# Patient Record
Sex: Female | Born: 1935 | Race: White | Hispanic: No | Smoking: Never smoker
Health system: Southern US, Community
[De-identification: ages and names within clinical notes are randomized; demographics above are authoritative.]

## PROBLEM LIST (undated history)

## (undated) DIAGNOSIS — G8929 Other chronic pain: Secondary | ICD-10-CM

## (undated) DIAGNOSIS — G43909 Migraine, unspecified, not intractable, without status migrainosus: Secondary | ICD-10-CM

## (undated) DIAGNOSIS — I471 Supraventricular tachycardia, unspecified: Secondary | ICD-10-CM

## (undated) DIAGNOSIS — K219 Gastro-esophageal reflux disease without esophagitis: Secondary | ICD-10-CM

## (undated) DIAGNOSIS — M1990 Unspecified osteoarthritis, unspecified site: Secondary | ICD-10-CM

## (undated) DIAGNOSIS — E782 Mixed hyperlipidemia: Secondary | ICD-10-CM

## (undated) DIAGNOSIS — I10 Essential (primary) hypertension: Secondary | ICD-10-CM

## (undated) DIAGNOSIS — C4490 Unspecified malignant neoplasm of skin, unspecified: Secondary | ICD-10-CM

## (undated) DIAGNOSIS — I209 Angina pectoris, unspecified: Secondary | ICD-10-CM

## (undated) DIAGNOSIS — M810 Age-related osteoporosis without current pathological fracture: Secondary | ICD-10-CM

## (undated) DIAGNOSIS — M545 Low back pain, unspecified: Secondary | ICD-10-CM

## (undated) DIAGNOSIS — I499 Cardiac arrhythmia, unspecified: Secondary | ICD-10-CM

## (undated) DIAGNOSIS — E039 Hypothyroidism, unspecified: Secondary | ICD-10-CM

## (undated) DIAGNOSIS — R0602 Shortness of breath: Secondary | ICD-10-CM

## (undated) DIAGNOSIS — D649 Anemia, unspecified: Secondary | ICD-10-CM

## (undated) HISTORY — PX: ABDOMINAL HYSTERECTOMY: SHX81

## (undated) HISTORY — PX: BACK SURGERY: SHX140

## (undated) HISTORY — PX: THROAT SURGERY: SHX803

## (undated) HISTORY — PX: CATARACT EXTRACTION W/ INTRAOCULAR LENS  IMPLANT, BILATERAL: SHX1307

---

## 1998-05-17 ENCOUNTER — Encounter: Payer: Self-pay | Admitting: Otolaryngology

## 1998-06-24 ENCOUNTER — Encounter: Payer: Self-pay | Admitting: Otolaryngology

## 1998-06-25 ENCOUNTER — Ambulatory Visit (HOSPITAL_COMMUNITY): Admission: RE | Admit: 1998-06-25 | Discharge: 1998-06-26 | Payer: Self-pay | Admitting: Otolaryngology

## 2000-02-13 ENCOUNTER — Encounter: Payer: Self-pay | Admitting: *Deleted

## 2000-02-13 ENCOUNTER — Encounter: Admission: RE | Admit: 2000-02-13 | Discharge: 2000-02-13 | Payer: Self-pay | Admitting: *Deleted

## 2000-03-07 ENCOUNTER — Encounter: Payer: Self-pay | Admitting: *Deleted

## 2000-03-07 ENCOUNTER — Encounter: Admission: RE | Admit: 2000-03-07 | Discharge: 2000-03-07 | Payer: Self-pay | Admitting: *Deleted

## 2000-03-09 ENCOUNTER — Encounter: Payer: Self-pay | Admitting: *Deleted

## 2000-03-09 ENCOUNTER — Other Ambulatory Visit: Admission: RE | Admit: 2000-03-09 | Discharge: 2000-03-09 | Payer: Self-pay | Admitting: *Deleted

## 2000-03-09 ENCOUNTER — Encounter: Admission: RE | Admit: 2000-03-09 | Discharge: 2000-03-09 | Payer: Self-pay | Admitting: *Deleted

## 2000-10-01 ENCOUNTER — Other Ambulatory Visit: Admission: RE | Admit: 2000-10-01 | Discharge: 2000-10-01 | Payer: Self-pay | Admitting: Internal Medicine

## 2000-10-11 ENCOUNTER — Encounter: Payer: Self-pay | Admitting: Internal Medicine

## 2000-10-11 ENCOUNTER — Encounter: Admission: RE | Admit: 2000-10-11 | Discharge: 2000-10-11 | Payer: Self-pay | Admitting: Internal Medicine

## 2001-03-28 ENCOUNTER — Encounter: Admission: RE | Admit: 2001-03-28 | Discharge: 2001-03-28 | Payer: Self-pay | Admitting: Internal Medicine

## 2001-03-28 ENCOUNTER — Encounter: Payer: Self-pay | Admitting: Internal Medicine

## 2001-04-02 ENCOUNTER — Encounter: Admission: RE | Admit: 2001-04-02 | Discharge: 2001-04-02 | Payer: Self-pay | Admitting: Unknown Physician Specialty

## 2001-04-02 ENCOUNTER — Encounter: Payer: Self-pay | Admitting: Internal Medicine

## 2002-04-29 ENCOUNTER — Encounter: Payer: Self-pay | Admitting: Internal Medicine

## 2002-04-29 ENCOUNTER — Encounter: Admission: RE | Admit: 2002-04-29 | Discharge: 2002-04-29 | Payer: Self-pay | Admitting: Internal Medicine

## 2002-11-26 ENCOUNTER — Ambulatory Visit (HOSPITAL_COMMUNITY): Admission: RE | Admit: 2002-11-26 | Discharge: 2002-11-26 | Payer: Self-pay | Admitting: Gastroenterology

## 2003-06-01 ENCOUNTER — Encounter: Payer: Self-pay | Admitting: Internal Medicine

## 2003-06-01 ENCOUNTER — Encounter: Admission: RE | Admit: 2003-06-01 | Discharge: 2003-06-01 | Payer: Self-pay | Admitting: Internal Medicine

## 2004-03-31 ENCOUNTER — Encounter: Admission: RE | Admit: 2004-03-31 | Discharge: 2004-03-31 | Payer: Self-pay | Admitting: Internal Medicine

## 2004-06-20 ENCOUNTER — Encounter: Admission: RE | Admit: 2004-06-20 | Discharge: 2004-06-20 | Payer: Self-pay | Admitting: Internal Medicine

## 2005-08-04 ENCOUNTER — Encounter: Admission: RE | Admit: 2005-08-04 | Discharge: 2005-08-04 | Payer: Self-pay | Admitting: Internal Medicine

## 2006-08-15 ENCOUNTER — Encounter: Admission: RE | Admit: 2006-08-15 | Discharge: 2006-08-15 | Payer: Self-pay | Admitting: Internal Medicine

## 2006-08-21 HISTORY — PX: LUMBAR DISC SURGERY: SHX700

## 2006-08-24 ENCOUNTER — Encounter: Admission: RE | Admit: 2006-08-24 | Discharge: 2006-08-24 | Payer: Self-pay | Admitting: Internal Medicine

## 2006-08-24 ENCOUNTER — Encounter (INDEPENDENT_AMBULATORY_CARE_PROVIDER_SITE_OTHER): Payer: Self-pay | Admitting: *Deleted

## 2006-09-27 ENCOUNTER — Encounter: Admission: RE | Admit: 2006-09-27 | Discharge: 2006-09-27 | Payer: Self-pay | Admitting: Internal Medicine

## 2007-03-27 ENCOUNTER — Encounter: Admission: RE | Admit: 2007-03-27 | Discharge: 2007-03-27 | Payer: Self-pay | Admitting: Internal Medicine

## 2007-04-03 ENCOUNTER — Encounter: Admission: RE | Admit: 2007-04-03 | Discharge: 2007-04-03 | Payer: Self-pay | Admitting: Internal Medicine

## 2007-04-23 ENCOUNTER — Inpatient Hospital Stay (HOSPITAL_COMMUNITY): Admission: RE | Admit: 2007-04-23 | Discharge: 2007-04-25 | Payer: Self-pay | Admitting: Neurosurgery

## 2007-06-03 ENCOUNTER — Inpatient Hospital Stay (HOSPITAL_COMMUNITY): Admission: AD | Admit: 2007-06-03 | Discharge: 2007-06-06 | Payer: Self-pay | Admitting: Internal Medicine

## 2007-08-19 ENCOUNTER — Encounter: Admission: RE | Admit: 2007-08-19 | Discharge: 2007-08-19 | Payer: Self-pay | Admitting: Internal Medicine

## 2008-08-19 ENCOUNTER — Encounter: Admission: RE | Admit: 2008-08-19 | Discharge: 2008-08-19 | Payer: Self-pay | Admitting: Internal Medicine

## 2009-08-23 ENCOUNTER — Encounter: Admission: RE | Admit: 2009-08-23 | Discharge: 2009-08-23 | Payer: Self-pay | Admitting: Internal Medicine

## 2010-08-24 ENCOUNTER — Encounter
Admission: RE | Admit: 2010-08-24 | Discharge: 2010-08-24 | Payer: Self-pay | Source: Home / Self Care | Attending: Internal Medicine | Admitting: Internal Medicine

## 2011-01-03 NOTE — Consult Note (Signed)
[name], [name] NO.:  [id number]   MEDICAL RECORD NO.:  [id number]          PATIENT TYPE:  INP   LOCATION:  5502                         FACILITY:  [facility]   PHYSICIAN:  Petra Kuba, M.D.    DATE OF BIRTH:  September 12, 1920   DATE OF CONSULTATION:  06/03/2007  DATE OF DISCHARGE:                                 CONSULTATION   We were asked to see [name] today in consultation for bright red  blood per rectum by Dr. Georgann Housekeeper.   HISTORY OF PRESENT ILLNESS:  This is an 77 year old female who has a  history of spinal surgery on April 23, 2007.  Since that time, her  family reports that she has given up.  She will not get out of bed.  She has no appetite.  She seems rather depressed.  On Tuesday, October  7, she had an episode of left lower quadrant pain and hematochezia.  Dr.  Donette Larry started her on Septra for possible diverticulitis.  Her red blood  continued with black stools until Saturday.  She reports that she has  had no blood per rectum since Saturday.   Positive for NSAIDs.  She takes an 81-mg aspirin per day and Excedrin 4  to 5 times a week.  Negative vomiting.  Positive anorexia.  Positive abdominal pain with cough or deep breath in left lower  quadrant.   The patient is concerned she has cancer and is dying.   PAST MEDICAL HISTORY:  She had a colonoscopy in 2004 by Dr. Danise Edge.  It was a screening colonoscopy.  He found left-sided  diverticulosis.  She has a history of EGD with dilatation for dysphagia,  also by Dr. Danise Edge.  She has hypertension, hyperlipidemia,  gastroesophageal reflux disease, coronary artery disease, glaucoma.  She  has had thyroid surgery and a hysterectomy.   CURRENT MEDICATIONS:  Include:  1. Fosamax.  2. Diltiazem.  3. Zoloft  4. An 81-mg aspirin.  5. Lisinopril.  6. Gemfibrozil.  7. Levothyroxine.  8. Metoprolol.  9. Prilosec.  10.Xalatan.  11.Nystatin triamcinolone cream.   12.Vicodin.   SHE HAS AN ALLERGY TO ASPIRIN AS WELL AS CODEINE.   REVIEW OF SYSTEMS:  Is significant for bilateral leg weakness.  She  describes shaking in her left leg in particular as well as shaking her  hands recently.  She states that she has had weight loss since surgery  but did not quantify it as well as headache and some depression.   FAMILY HISTORY:  Is significant for pancreatic cancer in both her mother  and her brother.  Her mother had a gastrectomy for ulcer disease as  well.   On physical exam, she is alert and oriented in no apparent distress.  Her temperature is 98.1, pulse 61, respirations 18, blood pressure  135/63.  Her heart has a regular rate and rhythm with no murmurs, rubs or gallops  appreciated.  Her lungs are clear.  Her abdomen is soft, nontender, nondistended with good bowel sounds.   Her labs show a hemoglobin of 10.5 over a hematocrit 31.3, white  count  5.7, platelets 347,000.  Her BUN is 27 over a creatinine 1.83.  LFTs are  within normal limits.  Serum albumin 3.6.  CT of abdomen and pelvis is  pending today.   ASSESSMENT:  Dr. Vida Rigger has seen and examined the patient, went  through the history, reviewed her chart.  His impression is that this is  an 77 year old female with multiple medical problems who has gotten down  after some spinal surgery and has had recent hematochezia.  We will  start by reviewing her CT scan this afternoon and consider what  endoscopic procedures, if any, may be appropriate.   Thank you very much for this consultation.      Stephani Police, PA    ______________________________  Petra Kuba, M.D.    MLY/MEDQ  D:  06/03/2007  T:  06/04/2007  Job:  [id number]   cc:   Georgann Housekeeper, MD  Fax: [phone]   Petra Kuba, M.D.  Fax: [phone]

## 2011-01-03 NOTE — Op Note (Signed)
NAMELATORIA, DRY NO.:  [id number]   MEDICAL RECORD NO.:  [id number]          PATIENT TYPE:  INP   LOCATION:  5502                         FACILITY:  [facility]   PHYSICIAN:  Petra Kuba, M.D.    DATE OF BIRTH:  [birth year]   DATE OF PROCEDURE:  06/05/2007  DATE OF DISCHARGE:                               OPERATIVE REPORT   PROCEDURE:  EGD.   INDICATIONS:  Guaiac positivity anemia.  Consent was signed after risks,  benefits, methods and options thoroughly discussed multiple times during  this week of hospitalization.   MEDICINES USED:  1. Fentanyl 75 mcg.  2. Versed 7.5 mg.   PROCEDURE:  The video endoscope was inserted by direct vision.  The  esophagus was normal.  She did have a small hiatal hernia and a wide  patent thin fibrous ring at the GE junction.  The scope passed easily  into the stomach, advanced into the antrum where some minimal antritis  was seen, and advanced through a normal pylorus into a normal duodenal  bulb and around the C loop to a normal second portion of the duodenum.  No blood was seen distally.  The scope was withdrawn back to the bulb  and a good look there ruled out abnormality in that location.  The scope  was withdrawn back to the stomach and retroflexed.  High in the cardia,  the hiatal hernia was confirmed.  You could see the ring on retroflexion  as well.  The fundus angularis, lesser and greater curve were all normal  on retroflexed visualization.  Straight visualization of the stomach did  not reveal any additional findings.  Air was suctioned.  The scope was  slowly withdrawn.  Again, a good look at the esophagus was normal.  The  scope was removed.  Patient tolerated the procedure well.  There was no  obvious immediate complication.   ENDOSCOPIC DIAGNOSES:  1. Small hiatal hernia with a widely patent and fibrous ring.  2. Minimal antritis.  3. Normal EGD.   PLAN:  Consider colonoscopy as an outpatient, either by me  or Dr.  Laural Benes.  He has done it before.  Well advance diet, hopefully home  soon.  Happily see back p.r.n.           ______________________________  Petra Kuba, M.D.     MEM/MEDQ  D:  06/05/2007  T:  06/05/2007  Job:  [id number]   cc:   Petra Kuba, M.D.  Georgann Housekeeper, MD  Danise Edge, M.D.

## 2011-01-03 NOTE — H&P (Signed)
[name], [name] NO.:  [id number]   MEDICAL RECORD NO.:  [id number]           PATIENT TYPE:   LOCATION:                                 FACILITY:   PHYSICIAN:  Georgann Housekeeper, MD      DATE OF BIRTH:  12/22/1920   DATE OF ADMISSION:  06/03/2007  DATE OF DISCHARGE:                              HISTORY & PHYSICAL   CHIEF COMPLAINT:  Weakness, abdominal pain, episode of rectal bleeding.   HISTORY OF PRESENT ILLNESS:  An 77 year old female with a history of  hypertension, diverticulosis and depression.  She had come in last week  with episode of rectal bleeding episodes without much fevers.  Her white  count and hemoglobin was okay.  She continues to have a little bit of  bleeding on and off, although she said the last couple days she has not  seen much.  She still is complaining of some abdominal discomfort.  She  had last colonoscopy in 2004, that showed diverticulosis.  She was  empirically treated with Septra.  She finished that course.  She has  been having poor appetite and not eating.  She recently had lumbar  surgery by Dr. Channing Mutters last month and continues to have some discomfort and  not able to get over that and has been more and more difficult in  walking with some discomfort with the leg and the back.  She says the  pain is not much there now.  She does feel weak more.   ALLERGIES:  No known drug allergies.   CURRENT MEDICATIONS:  1. Fosamax once a week.  2. Diltiazem 120 mg daily.  3. Zoloft 50 mg daily.  4. Synthroid 50 mcg daily.  5. Metoprolol 50 mg b.i.d.  6. Aspirin 81 mg daily on hold for last 4 days.  7. Lisinopril 20 mg daily.   PAST MEDICAL HISTORY:  1. Hypertension.  2. Chest pain with stress test negative in 2006.  Remote history of      SVT, no symptoms.  3. Hyperthyroidism.  4. Depression.  5. Dyslipidemia.  6. Osteoporosis.  7. Acid reflux disease.  8. Recent back surgery for arthritis and spinal stenosis.   PAST SURGICAL  HISTORY:  1. Back surgery, Dr. Channing Mutters, in September 2008.  2. Status post TAH.  3. Status post T&A.  4. Cataract surgeries.   SOCIAL HISTORY:  No tobacco, no alcohol.  Married.  One daughter.   FAMILY HISTORY:  Noncontributory.   PHYSICAL EXAMINATION:  VITAL SIGNS:  Blood pressure is 130/80, pulse 68,  temperature 97.4.  GENERAL:  She was weak and sitting in a wheelchair.  HEENT:  Pupils reactive, mucous membranes are moist.  NECK:  Supple.  LUNGS:  Clear.  CARDIAC:  S1, S2 without murmurs, rubs or gallops.  ABDOMEN:  Soft without tenderness.  RECTAL:  Deferred.  She had guaiac positive stools last week and some  rectal bleeding.  EXTREMITIES:  No edema.  NEUROLOGIC:  Nonfocal.   LABORATORY DATA AND X-RAY FINDINGS:  Pending.  Blood chemistries and  blood counts with last hemoglobin in the office was  on October 9, and  was 11.5 with normal white count.   IMPRESSION/PLAN:  An 77 year old female with weakness progressing, some  abdominal pain, on and off rectal bleeding with colonoscopy in 2004,  empirically treated with Septra last week.   1. Abdominal pain/rectal bleeding.  I will get a gastroenterology      consult.  Get a CT of the abdomen and pelvis.  Continue intravenous      fluids.  Hold off on any antibiotics at this point.  2. Recent back surgery.  I will get Dr. Channing Mutters to followup in the      hospital.  She has an appointment coming up.  Will get physical      therapy and that might help her.  Tylenol p.r.n. for pain right      now.  3. Hypothyroidism.  Check a TSH.  Continue Synthroid.  4. Hypertension.  Continue the diltiazem and Toprol, hold the      lisinopril.  5. Depression.  Continue on Zoloft.  Will get a physical therapy      evaluation, also.      Georgann Housekeeper, MD  Electronically Signed     KH/MEDQ  D:  06/03/2007  T:  06/04/2007  Job:  [id number]

## 2011-01-03 NOTE — Op Note (Signed)
NAMEJAYDALYNN, OLIVERO NO.:  [id number]   MEDICAL RECORD NO.:  [id number]          PATIENT TYPE:  INP   LOCATION:  3172                         FACILITY:  [facility]   PHYSICIAN:  Payton Doughty, M.D.      DATE OF BIRTH:  [birth year]   DATE OF PROCEDURE:  04/23/2007  DATE OF DISCHARGE:                               OPERATIVE REPORT   PREOPERATIVE DIAGNOSIS:  Left L4-5 foraminal disk.   POSTOPERATIVE DIAGNOSIS:  Left L4-5 foraminal disk.   OPERATIVE PROCEDURE:  Left L4-5 foraminal diskectomy.   SURGEON:  Payton Doughty, M.D.   ANESTHESIA:  General endotracheal.   PREP:  Sterile Betadine prep and scrub with alcohol wipe.   COMPLICATIONS:  None.   ASSISTANT:  C[place]ton.   DOCTOR ASSISTANT:  Jenkins.   BODY OF TEXT:  This is an 77 year old lady with left L4 radiculopathy  related to foraminal disk at 04/05 taken to operating room, smoothly  anesthetized, intubated, placed prone on the operating table.  Following  shave, prep, drape in the usual sterile fashion, the skin was  infiltrated with 1% lidocaine with 1:400,000 epinephrine.  The skin was  incised over the lamina of L4.  The lamina and transverse process of L4  and L5 were exposed the left side in the subperiosteal plane.  Intraoperative x-ray confirmed correctness of level, having confirmed  correctness of  level, the lateral portion of the pars interarticularis,  the superior portion of the superior articular facet of L5 were removed.  This exposed the 4 root as it rounded the L4 pedicle.  Working slightly  inferior and lateral to it, the large herniated disk was identified.  The disk was removed without difficulty.  Careful exploration of the  nerve root revealed that it was free.  Wound was irrigated.  Hemostasis  assured.  Depo-Medrol soaked fat was used to fill the laminotomy defect.  Successive layers of O Vicryl, 2-0 Vicryl and 3-0 nylon were used to  close.  Betadine Telfa dressing was applied.  The  patient returned to  recovery room in good condition.           ______________________________  Payton Doughty, M.D.     MWR/MEDQ  D:  04/23/2007  T:  04/23/2007  Job:  [id number]

## 2011-01-03 NOTE — Discharge Summary (Signed)
[name], [name] NO.:  [id number]   MEDICAL RECORD NO.:  [id number]          PATIENT TYPE:  INP   LOCATION:  5502                         FACILITY:  [facility]   PHYSICIAN:  Georgann Housekeeper, MD      DATE OF BIRTH:  30-Jan-1921   DATE OF ADMISSION:  06/03/2007  DATE OF DISCHARGE:  06/06/2007                               DISCHARGE SUMMARY   DISCHARGE DIAGNOSES:  1. Intermittent hematochezia.  2. Abdominal pain.  3. Dehydration with renal insufficiency secondary to dehydration,      resolved.  4. Back and leg pain status post back surgery.  5. Right renal cyst.  6. Anemia.  7. History of hypertension.   MEDICATIONS ON DISCHARGE:  1. Diltiazem 120 mg daily.  2. Lisinopril 20 mg daily.  3. Metoprolol 50 mg b.i.d.  4. Zoloft 15 mg daily.  5. Synthroid 50 mcg daily.  6. Prilosec 20 mg daily.  7. Fosamax 70 mg once a week.  8. Eye drops per home use.  9. Neurontin 100 mg b.i.d.  10.Tylenol p.r.n.  11.Aspirin 81 mg daily.  12.Iron 325 two times a day.   DISPOSITION:  Home-health arrangement.  Physical therapy.  Follow up in  10 days.   CONSULTATIONS:  GI.   PROCEDURE:  Endoscopy.  EGD which showed minimal enteritis and otherwise  negative EGD.  CT scan of the abdomen and pelvis negative for any  diverticulitis or abscess.  It showed renal cyst on the right.  MRI of  the renal, abdomen, and pelvis showed a cyst which was benign in  appearance.  Follow up in 6 months with another MRI.  As far as  incidental, left renal cyst also noted, small.   LABORATORY DATA:  Hemoglobin at the time of discharge 9.6, stable.  Creatinine 0.87, BUN 7, potassium 3.8, sodium 142.  TSH was 0.95.  Blood  pressure 136/60, pulse 64, temperature 98.1.   HOSPITAL COURSE:  1. An 77 year old admitted with intermittent hematochezia.  History of      colonoscopy in 2004 showing diverticulosis.  The patient was      empirically treated with Septra.  The patient had some nonspecific    abdominal discomfort and weakness.  2. GI:  The patient was admitted with no evidence of further GI bleed      or rectal bleed noted.  Hemoglobin remained stable.  She did not      require any blood transfusion.  GI was consulted.  Because of the      same operation four years ago, they decided just to do the EGD,      which was negative.  Colonoscopy needs to be followed up as an      outpatient with Dr. Laural Benes.  The patient's abdominal pain      resolved.  CT of the abdomen and pelvis was negative for any acute      abdominal process.  As far as incidental finding, right renal cyst      was noted.  3. Right renal cyst:  The patient had that on a 2005 CT scan, which  was slightly increased in size.  MRI was obtained which showed      benign nature of the cyst and recommended getting another followup      MRI in 6 months.  4. Renal insufficiency:  Creatinine of 1.8.  Started on IV fluids.      Her enalapril was held.  The patient's creatinine came back to      normal and the blood pressure medication was started back again.      Electrolytes remained stable.  5. Back pain and left leg pain:  Status post back surgery by Dr. Channing Mutters      about a month ago.  She had physical therapy.  Neurontin was      started for pain.  That will help her.  Take twice a day.  Her      narcotics were stopped.  Just Tylenol for pain with Neurontin.  We      will continue the physical therapy at home.  Followup with Dr. Channing Mutters      as an outpatient.  Follow up with me in 10 days.      Georgann Housekeeper, MD  Electronically Signed     KH/MEDQ  D:  06/06/2007  T:  06/06/2007  Job:  [id number]

## 2011-01-03 NOTE — H&P (Signed)
NAMEHARSIMRAN, [name] NO.:  [id number]   MEDICAL RECORD NO.:  [id number]          PATIENT TYPE:  INP   LOCATION:  3172                         FACILITY:  [facility]   PHYSICIAN:  Payton Doughty, M.D.      DATE OF BIRTH:  12/29/1920   DATE OF ADMISSION:  04/23/2007  DATE OF DISCHARGE:                              HISTORY & PHYSICAL   ADMISSION DIAGNOSES:  Foraminal disk at L4-5 on the left side.   HISTORY OF PRESENT ILLNESS:  An 77 year old right-handed white lady  since the end of July has been having pain down her left leg.  Ended up  with an MRI that showed a foraminal disk on the left side at L4-5 and  she said to me the pain keeps her up at night and makes it difficult for  her to walk.   PAST MEDICAL HISTORY:  Remarkable for:  1. Hypertension.  2. Coronary artery disease.   MEDICATIONS:  She takes:  1. Fosamax 70 mg once a week.  2. Gemfibrozil twice a day.  3. Diltiazem ER 120 mg daily.  4. __________  50 mg daily.  5. Levothyroxine 50 mcg daily.  6. Toprol 50 mg twice a day.  7. Prilosec once a day.  8. Aspirin a day.  9. Fioricet.  10.Meclizine.  11.Valium.  12.Drixoral on a p.r.n. basis.  13.Xalatan 1 drop in each eye at bedtime.  14.Nystatin 2 to 3 times a day.   ALLERGIES:  SHE SAYS SHE FEELS FUNNY WHEN SHE TAKES CODEINE.   PAST SURGICAL HISTORY:  1. Hysterectomy in 1994.  2. Tonsillectomy in the very remote past.   SOCIAL HISTORY:  She does not smoke or drink and is retired.   FAMILY HISTORY:  Not given.   REVIEW OF SYSTEMS:  Remarkable for night sweats, glasses, glaucoma,  balance disturbance, hypertension, hypercholesterolemia, difficulty with  bilateral leg weakness, leg pain, difficulty with memory and anxiety.   PHYSICAL EXAMINATION:  HEENT:  Within normal limits.  NECK:  She has good range of motion of her neck.  CHEST:  Clear.  CARDIAC:  Regular rate and rhythm.  ABDOMEN:  Nontender.  No hepatosplenomegaly.  EXTREMITIES:   Without clubbing or cyanosis.  GU:  Deferred.  Peripheral pulses are good.  NEUROLOGIC:  She is awake, alert, and oriented.  Cranial nerves are  intact.  MOTOR:  A 5/5 strength throughout the upper and lower extremities.  The  knee extensors on the left side that are pain limited 4/5.  Reflexes are  1 at the right knee, absent at the left, 1 at the ankles bilaterally.  Straight leg raise was negative.   RADIOLOGIC STUDIES:  An MRI shows a foraminal disk on the left side at  L4-5 with elevation of the left L4.   CLINICAL IMPRESSION:  1. Foraminal disk on the left side at L4-5.  2. Left L4 radiculopathy.   PLAN:  Transforaminal diskectomy.  The risks and benefits have been  discussed with her and she wishes to proceed.           ______________________________  Payton Doughty,  M.D.     MWR/MEDQ  D:  04/23/2007  T:  04/23/2007  Job:  [id number]

## 2011-01-06 NOTE — Op Note (Signed)
   NAME:  Marissa, [name]                      ACCOUNT NO.:  [id number]   MEDICAL RECORD NO.:  [id number]                   PATIENT TYPE:  AMB   LOCATION:  ENDO                                 [facility]ACILITY[facility]   PHYSICIAN:  Danise Edge, M.D.                DATE OF BIRTH:  03-Jul-1921   DATE OF PROCEDURE:  11/26/2002  DATE OF DISCHARGE:                                 OPERATIVE REPORT   PROCEDURE:  Screening colonoscopy.   INDICATIONS FOR PROCEDURE:  [name] is a 77 year old female  born 03-23-1921. [name] is scheduled to undergo her first  screening colonoscopy with polypectomy to prevent colon cancer.   ENDOSCOPIST:  Charolett Bumpers, M.D.   PREMEDICATION:  Versed 5 mg, Demerol 50 mg .   DESCRIPTION OF PROCEDURE:  After obtaining informed consent, [name]  was placed in the left lateral decubitus position. I administered  intravenous Demerol and intravenous Versed to achieve conscious sedation for  the procedure. The patient's blood pressure, oxygen saturation and cardiac  rhythm were monitored throughout the procedure and documented in the medical  record.   Anal inspection was normal. Digital rectal exam was normal. The Olympus  pediatric video adjustable colonoscope was introduced into the rectum and  advanced to the cecum. Colonic preparation for the exam today was excellent.   RECTUM:  Normal.   SIGMOID COLON AND DESCENDING COLON:  Extensive left colonic diverticulosis.   SPLENIC FLEXURE:  Normal.   TRANSVERSE COLON:  Normal.   HEPATIC FLEXURE:  Normal.   ASCENDING COLON:  Normal.   CECUM AND ILEOCECAL VALVE:  Normal.    ASSESSMENT:  Left colonic diverticulosis; otherwise, normal screening  proctocolonoscopy to the cecum. No endoscopic evidence for the presence of  colorectal neoplasia.                                               Danise Edge, M.D.    MJ/MEDQ  D:  11/26/2002  T:  11/26/2002  Job:  [id number]   cc:    Georgann Housekeeper, M.D.  [address]  [place]  [place] [address]  Fax: [phone]

## 2011-04-11 ENCOUNTER — Other Ambulatory Visit: Payer: Self-pay | Admitting: Dermatology

## 2011-05-31 LAB — CBC
HCT: 31.2 — ABNORMAL LOW
MCV: 85.8
RBC: 3.31 — ABNORMAL LOW
RDW: 13.2
RDW: 13.6

## 2011-05-31 LAB — BASIC METABOLIC PANEL
BUN: 7
CO2: 22
Chloride: 112
Creatinine, Ser: 0.87
GFR calc Af Amer: >60
GFR calc non Af Amer: >60
Glucose, Bld: 106 — ABNORMAL HIGH

## 2011-05-31 LAB — APTT: aPTT: 51 — ABNORMAL HIGH

## 2011-06-01 LAB — COMPREHENSIVE METABOLIC PANEL
ALT: 11
AST: 16
AST: 18
Albumin: 3.1 — ABNORMAL LOW
Albumin: 3.6
Alkaline Phosphatase: 64
BUN: 27 — ABNORMAL HIGH
CO2: 22
Calcium: 8.5
Chloride: 108
Creatinine, Ser: 1.57 — ABNORMAL HIGH
GFR calc Af Amer: 32 — ABNORMAL LOW
GFR calc Af Amer: 38 — ABNORMAL LOW
Total Protein: 5.8 — ABNORMAL LOW

## 2011-06-01 LAB — CBC
MCHC: 33
MCHC: 33.5
MCV: 87.3
MCV: 88
Platelets: 301
RBC: 3.58 — ABNORMAL LOW
RDW: 13.6

## 2011-06-02 LAB — DIFFERENTIAL
Basophils Absolute: 0
Eosinophils Absolute: 0.4
Eosinophils Relative: 9 — ABNORMAL HIGH
Lymphocytes Relative: 35

## 2011-06-02 LAB — URINE MICROSCOPIC-ADD ON

## 2011-06-02 LAB — URINALYSIS, ROUTINE W REFLEX MICROSCOPIC
Bilirubin Urine: NEGATIVE
Glucose, UA: NEGATIVE
Hgb urine dipstick: NEGATIVE
Ketones, ur: NEGATIVE
Protein, ur: NEGATIVE

## 2011-06-02 LAB — TYPE AND SCREEN
ABO/RH(D): A POS
Antibody Screen: NEGATIVE

## 2011-06-02 LAB — COMPREHENSIVE METABOLIC PANEL
AST: 19
CO2: 28
Chloride: 105
Creatinine, Ser: 0.84
GFR calc Af Amer: >60
GFR calc non Af Amer: >60
Total Bilirubin: 0.6

## 2011-06-02 LAB — CBC
HCT: 36.5
Hemoglobin: 12.4
MCV: 88.5
RBC: 4.13
WBC: 5

## 2011-06-02 LAB — PROTIME-INR: Prothrombin Time: 13.5

## 2011-06-02 LAB — ABO/RH: ABO/RH(D): A POS

## 2011-09-06 ENCOUNTER — Other Ambulatory Visit: Payer: Self-pay | Admitting: Internal Medicine

## 2011-09-06 DIAGNOSIS — Z1231 Encounter for screening mammogram for malignant neoplasm of breast: Secondary | ICD-10-CM

## 2011-09-22 ENCOUNTER — Ambulatory Visit
Admission: RE | Admit: 2011-09-22 | Discharge: 2011-09-22 | Disposition: A | Discharge status: Home / Self Care | Payer: Medicare Other | Source: Ambulatory Visit | Attending: Internal Medicine | Admitting: Internal Medicine

## 2011-09-22 DIAGNOSIS — Z1231 Encounter for screening mammogram for malignant neoplasm of breast: Secondary | ICD-10-CM

## 2011-11-10 DIAGNOSIS — I10 Essential (primary) hypertension: Secondary | ICD-10-CM | POA: Diagnosis not present

## 2011-11-10 DIAGNOSIS — E039 Hypothyroidism, unspecified: Secondary | ICD-10-CM | POA: Diagnosis not present

## 2011-11-10 DIAGNOSIS — Z0000 Encounter for general adult medical examination without abnormal findings: Secondary | ICD-10-CM | POA: Diagnosis not present

## 2011-11-10 DIAGNOSIS — M810 Age-related osteoporosis without current pathological fracture: Secondary | ICD-10-CM | POA: Diagnosis not present

## 2011-11-10 DIAGNOSIS — K219 Gastro-esophageal reflux disease without esophagitis: Secondary | ICD-10-CM | POA: Diagnosis not present

## 2011-11-10 DIAGNOSIS — E782 Mixed hyperlipidemia: Secondary | ICD-10-CM | POA: Diagnosis not present

## 2011-11-10 DIAGNOSIS — D649 Anemia, unspecified: Secondary | ICD-10-CM | POA: Diagnosis not present

## 2011-11-14 DIAGNOSIS — E039 Hypothyroidism, unspecified: Secondary | ICD-10-CM | POA: Diagnosis not present

## 2011-11-14 DIAGNOSIS — E782 Mixed hyperlipidemia: Secondary | ICD-10-CM | POA: Diagnosis not present

## 2011-11-14 DIAGNOSIS — M810 Age-related osteoporosis without current pathological fracture: Secondary | ICD-10-CM | POA: Diagnosis not present

## 2011-11-14 DIAGNOSIS — N182 Chronic kidney disease, stage 2 (mild): Secondary | ICD-10-CM | POA: Diagnosis not present

## 2011-11-14 DIAGNOSIS — I10 Essential (primary) hypertension: Secondary | ICD-10-CM | POA: Diagnosis not present

## 2011-11-14 DIAGNOSIS — K219 Gastro-esophageal reflux disease without esophagitis: Secondary | ICD-10-CM | POA: Diagnosis not present

## 2011-12-05 DIAGNOSIS — M810 Age-related osteoporosis without current pathological fracture: Secondary | ICD-10-CM | POA: Diagnosis not present

## 2011-12-08 DIAGNOSIS — H4011X0 Primary open-angle glaucoma, stage unspecified: Secondary | ICD-10-CM | POA: Diagnosis not present

## 2011-12-08 DIAGNOSIS — H409 Unspecified glaucoma: Secondary | ICD-10-CM | POA: Diagnosis not present

## 2012-02-13 ENCOUNTER — Other Ambulatory Visit: Payer: Self-pay | Admitting: Internal Medicine

## 2012-02-13 DIAGNOSIS — R42 Dizziness and giddiness: Secondary | ICD-10-CM | POA: Diagnosis not present

## 2012-02-13 DIAGNOSIS — R270 Ataxia, unspecified: Secondary | ICD-10-CM

## 2012-02-13 DIAGNOSIS — R2681 Unsteadiness on feet: Secondary | ICD-10-CM

## 2012-02-16 ENCOUNTER — Ambulatory Visit
Admission: RE | Admit: 2012-02-16 | Discharge: 2012-02-16 | Disposition: A | Discharge status: Home / Self Care | Payer: Medicare Other | Source: Ambulatory Visit | Attending: Internal Medicine | Admitting: Internal Medicine

## 2012-02-16 DIAGNOSIS — R2681 Unsteadiness on feet: Secondary | ICD-10-CM

## 2012-02-16 DIAGNOSIS — R42 Dizziness and giddiness: Secondary | ICD-10-CM

## 2012-02-16 DIAGNOSIS — G319 Degenerative disease of nervous system, unspecified: Secondary | ICD-10-CM | POA: Diagnosis not present

## 2012-02-16 DIAGNOSIS — I672 Cerebral atherosclerosis: Secondary | ICD-10-CM | POA: Diagnosis not present

## 2012-02-16 DIAGNOSIS — R270 Ataxia, unspecified: Secondary | ICD-10-CM

## 2012-02-16 MED ORDER — GADOBENATE DIMEGLUMINE 529 MG/ML IV SOLN
11.0000 mL | Freq: Once | INTRAVENOUS | Status: AC | PRN
  Administered 2012-02-16: 11 mL via INTRAVENOUS

## 2012-02-26 DIAGNOSIS — G450 Vertebro-basilar artery syndrome: Secondary | ICD-10-CM | POA: Diagnosis not present

## 2012-02-26 DIAGNOSIS — R42 Dizziness and giddiness: Secondary | ICD-10-CM | POA: Diagnosis not present

## 2012-03-04 DIAGNOSIS — H4011X0 Primary open-angle glaucoma, stage unspecified: Secondary | ICD-10-CM | POA: Diagnosis not present

## 2012-03-04 DIAGNOSIS — H409 Unspecified glaucoma: Secondary | ICD-10-CM | POA: Diagnosis not present

## 2012-03-15 ENCOUNTER — Encounter: Payer: Medicare Other | Admitting: Physical Therapy

## 2012-03-15 ENCOUNTER — Ambulatory Visit: Payer: Medicare Other | Attending: Internal Medicine | Admitting: Rehabilitative and Restorative Service Providers"

## 2012-03-15 DIAGNOSIS — R269 Unspecified abnormalities of gait and mobility: Secondary | ICD-10-CM | POA: Insufficient documentation

## 2012-03-15 DIAGNOSIS — IMO0001 Reserved for inherently not codable concepts without codable children: Secondary | ICD-10-CM | POA: Diagnosis not present

## 2012-03-15 DIAGNOSIS — R42 Dizziness and giddiness: Secondary | ICD-10-CM | POA: Insufficient documentation

## 2012-03-27 ENCOUNTER — Ambulatory Visit: Payer: Medicare Other | Attending: Internal Medicine | Admitting: Rehabilitative and Restorative Service Providers"

## 2012-03-27 DIAGNOSIS — R42 Dizziness and giddiness: Secondary | ICD-10-CM | POA: Insufficient documentation

## 2012-03-27 DIAGNOSIS — R269 Unspecified abnormalities of gait and mobility: Secondary | ICD-10-CM | POA: Diagnosis not present

## 2012-03-27 DIAGNOSIS — IMO0001 Reserved for inherently not codable concepts without codable children: Secondary | ICD-10-CM | POA: Insufficient documentation

## 2012-03-29 ENCOUNTER — Ambulatory Visit: Payer: Medicare Other | Admitting: Rehabilitative and Restorative Service Providers"

## 2012-03-29 DIAGNOSIS — IMO0001 Reserved for inherently not codable concepts without codable children: Secondary | ICD-10-CM | POA: Diagnosis not present

## 2012-03-29 DIAGNOSIS — R42 Dizziness and giddiness: Secondary | ICD-10-CM | POA: Diagnosis not present

## 2012-03-29 DIAGNOSIS — R269 Unspecified abnormalities of gait and mobility: Secondary | ICD-10-CM | POA: Diagnosis not present

## 2012-04-02 ENCOUNTER — Ambulatory Visit: Payer: Medicare Other | Admitting: Rehabilitative and Restorative Service Providers"

## 2012-04-02 DIAGNOSIS — IMO0001 Reserved for inherently not codable concepts without codable children: Secondary | ICD-10-CM | POA: Diagnosis not present

## 2012-04-02 DIAGNOSIS — R42 Dizziness and giddiness: Secondary | ICD-10-CM | POA: Diagnosis not present

## 2012-04-02 DIAGNOSIS — R269 Unspecified abnormalities of gait and mobility: Secondary | ICD-10-CM | POA: Diagnosis not present

## 2012-04-05 ENCOUNTER — Encounter: Payer: Medicare Other | Admitting: Rehabilitative and Restorative Service Providers"

## 2012-04-15 ENCOUNTER — Ambulatory Visit: Payer: Medicare Other | Admitting: Rehabilitative and Restorative Service Providers"

## 2012-04-15 DIAGNOSIS — R269 Unspecified abnormalities of gait and mobility: Secondary | ICD-10-CM | POA: Diagnosis not present

## 2012-04-15 DIAGNOSIS — IMO0001 Reserved for inherently not codable concepts without codable children: Secondary | ICD-10-CM | POA: Diagnosis not present

## 2012-04-15 DIAGNOSIS — R42 Dizziness and giddiness: Secondary | ICD-10-CM | POA: Diagnosis not present

## 2012-04-19 ENCOUNTER — Encounter: Payer: Medicare Other | Admitting: Rehabilitative and Restorative Service Providers"

## 2012-04-23 ENCOUNTER — Encounter: Payer: Medicare Other | Admitting: Rehabilitative and Restorative Service Providers"

## 2012-04-26 ENCOUNTER — Encounter: Payer: Medicare Other | Admitting: Rehabilitative and Restorative Service Providers"

## 2012-05-17 DIAGNOSIS — E782 Mixed hyperlipidemia: Secondary | ICD-10-CM | POA: Diagnosis not present

## 2012-05-17 DIAGNOSIS — I471 Supraventricular tachycardia: Secondary | ICD-10-CM | POA: Diagnosis not present

## 2012-05-17 DIAGNOSIS — Z23 Encounter for immunization: Secondary | ICD-10-CM | POA: Diagnosis not present

## 2012-05-17 DIAGNOSIS — F329 Major depressive disorder, single episode, unspecified: Secondary | ICD-10-CM | POA: Diagnosis not present

## 2012-05-17 DIAGNOSIS — I10 Essential (primary) hypertension: Secondary | ICD-10-CM | POA: Diagnosis not present

## 2012-05-17 DIAGNOSIS — E039 Hypothyroidism, unspecified: Secondary | ICD-10-CM | POA: Diagnosis not present

## 2012-05-27 DIAGNOSIS — L255 Unspecified contact dermatitis due to plants, except food: Secondary | ICD-10-CM | POA: Diagnosis not present

## 2012-06-06 DIAGNOSIS — H4011X0 Primary open-angle glaucoma, stage unspecified: Secondary | ICD-10-CM | POA: Diagnosis not present

## 2012-06-06 DIAGNOSIS — H409 Unspecified glaucoma: Secondary | ICD-10-CM | POA: Diagnosis not present

## 2012-08-26 DIAGNOSIS — J209 Acute bronchitis, unspecified: Secondary | ICD-10-CM | POA: Diagnosis not present

## 2012-09-16 DIAGNOSIS — H4011X0 Primary open-angle glaucoma, stage unspecified: Secondary | ICD-10-CM | POA: Diagnosis not present

## 2012-09-16 DIAGNOSIS — Z961 Presence of intraocular lens: Secondary | ICD-10-CM | POA: Diagnosis not present

## 2012-09-16 DIAGNOSIS — H353190 Nonexudative age-related macular degeneration, unspecified eye, stage unspecified: Secondary | ICD-10-CM | POA: Diagnosis not present

## 2012-09-16 DIAGNOSIS — H409 Unspecified glaucoma: Secondary | ICD-10-CM | POA: Diagnosis not present

## 2012-09-26 ENCOUNTER — Other Ambulatory Visit: Payer: Self-pay | Admitting: Internal Medicine

## 2012-09-26 DIAGNOSIS — Z1231 Encounter for screening mammogram for malignant neoplasm of breast: Secondary | ICD-10-CM

## 2012-10-23 ENCOUNTER — Ambulatory Visit
Admission: RE | Admit: 2012-10-23 | Discharge: 2012-10-23 | Disposition: A | Discharge status: Home / Self Care | Payer: Medicare Other | Source: Ambulatory Visit | Attending: Internal Medicine | Admitting: Internal Medicine

## 2012-10-23 DIAGNOSIS — Z1231 Encounter for screening mammogram for malignant neoplasm of breast: Secondary | ICD-10-CM

## 2012-11-15 DIAGNOSIS — Z0000 Encounter for general adult medical examination without abnormal findings: Secondary | ICD-10-CM | POA: Diagnosis not present

## 2012-11-15 DIAGNOSIS — I10 Essential (primary) hypertension: Secondary | ICD-10-CM | POA: Diagnosis not present

## 2012-11-15 DIAGNOSIS — Z1331 Encounter for screening for depression: Secondary | ICD-10-CM | POA: Diagnosis not present

## 2012-11-15 DIAGNOSIS — E782 Mixed hyperlipidemia: Secondary | ICD-10-CM | POA: Diagnosis not present

## 2012-11-15 DIAGNOSIS — E039 Hypothyroidism, unspecified: Secondary | ICD-10-CM | POA: Diagnosis not present

## 2012-11-15 DIAGNOSIS — K219 Gastro-esophageal reflux disease without esophagitis: Secondary | ICD-10-CM | POA: Diagnosis not present

## 2012-11-15 DIAGNOSIS — N182 Chronic kidney disease, stage 2 (mild): Secondary | ICD-10-CM | POA: Diagnosis not present

## 2012-11-15 DIAGNOSIS — I471 Supraventricular tachycardia: Secondary | ICD-10-CM | POA: Diagnosis not present

## 2012-12-12 DIAGNOSIS — H409 Unspecified glaucoma: Secondary | ICD-10-CM | POA: Diagnosis not present

## 2012-12-12 DIAGNOSIS — H4011X0 Primary open-angle glaucoma, stage unspecified: Secondary | ICD-10-CM | POA: Diagnosis not present

## 2012-12-24 DIAGNOSIS — L219 Seborrheic dermatitis, unspecified: Secondary | ICD-10-CM | POA: Diagnosis not present

## 2012-12-24 DIAGNOSIS — D485 Neoplasm of uncertain behavior of skin: Secondary | ICD-10-CM | POA: Diagnosis not present

## 2012-12-24 DIAGNOSIS — B351 Tinea unguium: Secondary | ICD-10-CM | POA: Diagnosis not present

## 2012-12-24 DIAGNOSIS — L650 Telogen effluvium: Secondary | ICD-10-CM | POA: Diagnosis not present

## 2013-01-01 DIAGNOSIS — M549 Dorsalgia, unspecified: Secondary | ICD-10-CM | POA: Diagnosis not present

## 2013-01-01 DIAGNOSIS — I10 Essential (primary) hypertension: Secondary | ICD-10-CM | POA: Diagnosis not present

## 2013-01-10 DIAGNOSIS — L255 Unspecified contact dermatitis due to plants, except food: Secondary | ICD-10-CM | POA: Diagnosis not present

## 2013-03-17 DIAGNOSIS — H409 Unspecified glaucoma: Secondary | ICD-10-CM | POA: Diagnosis not present

## 2013-03-17 DIAGNOSIS — H4011X0 Primary open-angle glaucoma, stage unspecified: Secondary | ICD-10-CM | POA: Diagnosis not present

## 2013-05-16 DIAGNOSIS — K219 Gastro-esophageal reflux disease without esophagitis: Secondary | ICD-10-CM | POA: Diagnosis not present

## 2013-05-16 DIAGNOSIS — E039 Hypothyroidism, unspecified: Secondary | ICD-10-CM | POA: Diagnosis not present

## 2013-05-16 DIAGNOSIS — E782 Mixed hyperlipidemia: Secondary | ICD-10-CM | POA: Diagnosis not present

## 2013-05-16 DIAGNOSIS — I10 Essential (primary) hypertension: Secondary | ICD-10-CM | POA: Diagnosis not present

## 2013-05-16 DIAGNOSIS — F329 Major depressive disorder, single episode, unspecified: Secondary | ICD-10-CM | POA: Diagnosis not present

## 2013-05-16 DIAGNOSIS — N182 Chronic kidney disease, stage 2 (mild): Secondary | ICD-10-CM | POA: Diagnosis not present

## 2013-06-02 DIAGNOSIS — L659 Nonscarring hair loss, unspecified: Secondary | ICD-10-CM | POA: Diagnosis not present

## 2013-06-02 DIAGNOSIS — L259 Unspecified contact dermatitis, unspecified cause: Secondary | ICD-10-CM | POA: Diagnosis not present

## 2013-06-16 DIAGNOSIS — Z961 Presence of intraocular lens: Secondary | ICD-10-CM | POA: Diagnosis not present

## 2013-06-16 DIAGNOSIS — H4011X0 Primary open-angle glaucoma, stage unspecified: Secondary | ICD-10-CM | POA: Diagnosis not present

## 2013-06-16 DIAGNOSIS — H409 Unspecified glaucoma: Secondary | ICD-10-CM | POA: Diagnosis not present

## 2013-06-27 DIAGNOSIS — M545 Low back pain, unspecified: Secondary | ICD-10-CM | POA: Diagnosis not present

## 2013-06-27 DIAGNOSIS — I10 Essential (primary) hypertension: Secondary | ICD-10-CM | POA: Diagnosis not present

## 2013-06-30 ENCOUNTER — Ambulatory Visit
Admission: RE | Admit: 2013-06-30 | Discharge: 2013-06-30 | Disposition: A | Discharge status: Home / Self Care | Payer: Medicare Other | Source: Ambulatory Visit | Attending: Internal Medicine | Admitting: Internal Medicine

## 2013-06-30 ENCOUNTER — Other Ambulatory Visit: Payer: Self-pay | Admitting: Internal Medicine

## 2013-06-30 DIAGNOSIS — M47817 Spondylosis without myelopathy or radiculopathy, lumbosacral region: Secondary | ICD-10-CM | POA: Diagnosis not present

## 2013-06-30 DIAGNOSIS — M545 Low back pain, unspecified: Secondary | ICD-10-CM

## 2013-07-02 ENCOUNTER — Other Ambulatory Visit: Payer: Self-pay | Admitting: Internal Medicine

## 2013-07-02 ENCOUNTER — Other Ambulatory Visit (HOSPITAL_COMMUNITY): Payer: Self-pay | Admitting: Internal Medicine

## 2013-07-02 DIAGNOSIS — IMO0002 Reserved for concepts with insufficient information to code with codable children: Secondary | ICD-10-CM | POA: Diagnosis not present

## 2013-07-02 DIAGNOSIS — M5137 Other intervertebral disc degeneration, lumbosacral region: Secondary | ICD-10-CM | POA: Diagnosis not present

## 2013-07-02 DIAGNOSIS — I10 Essential (primary) hypertension: Secondary | ICD-10-CM | POA: Diagnosis not present

## 2013-07-02 DIAGNOSIS — M545 Low back pain, unspecified: Secondary | ICD-10-CM

## 2013-07-04 ENCOUNTER — Ambulatory Visit (HOSPITAL_COMMUNITY)
Admission: RE | Admit: 2013-07-04 | Discharge: 2013-07-04 | Disposition: A | Discharge status: Home / Self Care | Payer: Medicare Other | Source: Ambulatory Visit | Attending: Internal Medicine | Admitting: Internal Medicine

## 2013-07-04 DIAGNOSIS — M545 Low back pain, unspecified: Secondary | ICD-10-CM

## 2013-07-04 DIAGNOSIS — N269 Renal sclerosis, unspecified: Secondary | ICD-10-CM | POA: Insufficient documentation

## 2013-07-04 DIAGNOSIS — M79609 Pain in unspecified limb: Secondary | ICD-10-CM | POA: Diagnosis not present

## 2013-07-04 DIAGNOSIS — M47817 Spondylosis without myelopathy or radiculopathy, lumbosacral region: Secondary | ICD-10-CM | POA: Insufficient documentation

## 2013-07-04 DIAGNOSIS — M4000 Postural kyphosis, site unspecified: Secondary | ICD-10-CM | POA: Diagnosis not present

## 2013-07-04 DIAGNOSIS — M5126 Other intervertebral disc displacement, lumbar region: Secondary | ICD-10-CM | POA: Diagnosis not present

## 2013-07-04 DIAGNOSIS — I77819 Aortic ectasia, unspecified site: Secondary | ICD-10-CM | POA: Diagnosis not present

## 2013-07-04 DIAGNOSIS — M47814 Spondylosis without myelopathy or radiculopathy, thoracic region: Secondary | ICD-10-CM | POA: Insufficient documentation

## 2013-07-04 DIAGNOSIS — M4310 Spondylolisthesis, site unspecified: Secondary | ICD-10-CM | POA: Diagnosis not present

## 2013-07-08 DIAGNOSIS — IMO0002 Reserved for concepts with insufficient information to code with codable children: Secondary | ICD-10-CM | POA: Diagnosis not present

## 2013-07-08 DIAGNOSIS — I10 Essential (primary) hypertension: Secondary | ICD-10-CM | POA: Diagnosis not present

## 2013-07-09 ENCOUNTER — Other Ambulatory Visit: Payer: Self-pay | Admitting: Internal Medicine

## 2013-07-09 DIAGNOSIS — M549 Dorsalgia, unspecified: Secondary | ICD-10-CM

## 2013-07-14 ENCOUNTER — Ambulatory Visit
Admission: RE | Admit: 2013-07-14 | Discharge: 2013-07-14 | Disposition: A | Discharge status: Home / Self Care | Payer: Medicare Other | Source: Ambulatory Visit | Attending: Internal Medicine | Admitting: Internal Medicine

## 2013-07-14 VITALS — BP 198/85 | HR 58

## 2013-07-14 DIAGNOSIS — M549 Dorsalgia, unspecified: Secondary | ICD-10-CM

## 2013-07-14 DIAGNOSIS — M5126 Other intervertebral disc displacement, lumbar region: Secondary | ICD-10-CM | POA: Diagnosis not present

## 2013-07-14 MED ORDER — IOHEXOL 180 MG/ML  SOLN: 1.0000 mL | Freq: Once | INTRAMUSCULAR | Status: AC | PRN

## 2013-07-14 MED ORDER — METHYLPREDNISOLONE ACETATE 40 MG/ML INJ SUSP (RADIOLOG: 120.0000 mg | Freq: Once | INTRAMUSCULAR | Status: DC

## 2013-08-01 ENCOUNTER — Other Ambulatory Visit: Payer: Self-pay | Admitting: Internal Medicine

## 2013-08-01 DIAGNOSIS — M5136 Other intervertebral disc degeneration, lumbar region: Secondary | ICD-10-CM

## 2013-08-07 ENCOUNTER — Ambulatory Visit
Admission: RE | Admit: 2013-08-07 | Discharge: 2013-08-07 | Disposition: A | Discharge status: Home / Self Care | Payer: Medicare Other | Source: Ambulatory Visit | Attending: Internal Medicine | Admitting: Internal Medicine

## 2013-08-07 DIAGNOSIS — M5136 Other intervertebral disc degeneration, lumbar region: Secondary | ICD-10-CM

## 2013-08-07 DIAGNOSIS — M51369 Other intervertebral disc degeneration, lumbar region without mention of lumbar back pain or lower extremity pain: Secondary | ICD-10-CM

## 2013-08-07 DIAGNOSIS — M545 Low back pain, unspecified: Secondary | ICD-10-CM | POA: Diagnosis not present

## 2013-08-07 MED ORDER — IOHEXOL 180 MG/ML  SOLN
1.0000 mL | Freq: Once | INTRAMUSCULAR | Status: AC | PRN
  Administered 2013-08-07: 1 mL via EPIDURAL

## 2013-08-07 MED ORDER — METHYLPREDNISOLONE ACETATE 40 MG/ML INJ SUSP (RADIOLOG
120.0000 mg | Freq: Once | INTRAMUSCULAR | Status: AC
  Administered 2013-08-07: 120 mg via EPIDURAL

## 2013-08-22 ENCOUNTER — Observation Stay (HOSPITAL_COMMUNITY): Payer: Medicare Other | Admitting: Anesthesiology

## 2013-08-22 ENCOUNTER — Emergency Department (HOSPITAL_COMMUNITY): Payer: Medicare Other

## 2013-08-22 ENCOUNTER — Encounter (HOSPITAL_COMMUNITY)
Admission: EM | Disposition: A | Discharge status: Home / Self Care | Payer: Self-pay | Source: Home / Self Care | Attending: Emergency Medicine

## 2013-08-22 ENCOUNTER — Observation Stay (HOSPITAL_COMMUNITY)
Admission: EM | Admit: 2013-08-22 | Discharge: 2013-08-24 | Disposition: A | Discharge status: Home / Self Care | Payer: Medicare Other | Attending: Surgery | Admitting: Surgery

## 2013-08-22 ENCOUNTER — Encounter (HOSPITAL_COMMUNITY): Payer: Medicare Other | Admitting: Anesthesiology

## 2013-08-22 ENCOUNTER — Encounter (HOSPITAL_COMMUNITY): Payer: Self-pay | Admitting: Emergency Medicine

## 2013-08-22 DIAGNOSIS — K3580 Unspecified acute appendicitis: Secondary | ICD-10-CM

## 2013-08-22 DIAGNOSIS — E782 Mixed hyperlipidemia: Secondary | ICD-10-CM | POA: Diagnosis not present

## 2013-08-22 DIAGNOSIS — E039 Hypothyroidism, unspecified: Secondary | ICD-10-CM | POA: Diagnosis not present

## 2013-08-22 DIAGNOSIS — G8929 Other chronic pain: Secondary | ICD-10-CM | POA: Insufficient documentation

## 2013-08-22 DIAGNOSIS — M810 Age-related osteoporosis without current pathological fracture: Secondary | ICD-10-CM | POA: Insufficient documentation

## 2013-08-22 DIAGNOSIS — I471 Supraventricular tachycardia, unspecified: Secondary | ICD-10-CM | POA: Insufficient documentation

## 2013-08-22 DIAGNOSIS — M549 Dorsalgia, unspecified: Secondary | ICD-10-CM | POA: Diagnosis not present

## 2013-08-22 DIAGNOSIS — R109 Unspecified abdominal pain: Secondary | ICD-10-CM | POA: Diagnosis not present

## 2013-08-22 DIAGNOSIS — R4181 Age-related cognitive decline: Secondary | ICD-10-CM | POA: Insufficient documentation

## 2013-08-22 DIAGNOSIS — K219 Gastro-esophageal reflux disease without esophagitis: Secondary | ICD-10-CM | POA: Diagnosis not present

## 2013-08-22 DIAGNOSIS — I10 Essential (primary) hypertension: Secondary | ICD-10-CM | POA: Diagnosis not present

## 2013-08-22 DIAGNOSIS — K8020 Calculus of gallbladder without cholecystitis without obstruction: Secondary | ICD-10-CM | POA: Diagnosis not present

## 2013-08-22 HISTORY — DX: Migraine, unspecified, not intractable, without status migrainosus: G43.909

## 2013-08-22 HISTORY — DX: Low back pain: M54.5

## 2013-08-22 HISTORY — DX: Supraventricular tachycardia, unspecified: I47.10

## 2013-08-22 HISTORY — DX: Low back pain, unspecified: M54.50

## 2013-08-22 HISTORY — DX: Mixed hyperlipidemia: E78.2

## 2013-08-22 HISTORY — DX: Unspecified malignant neoplasm of skin, unspecified: C44.90

## 2013-08-22 HISTORY — DX: Essential (primary) hypertension: I10

## 2013-08-22 HISTORY — DX: Unspecified osteoarthritis, unspecified site: M19.90

## 2013-08-22 HISTORY — DX: Shortness of breath: R06.02

## 2013-08-22 HISTORY — PX: LAPAROSCOPIC APPENDECTOMY: SHX408

## 2013-08-22 HISTORY — DX: Angina pectoris, unspecified: I20.9

## 2013-08-22 HISTORY — PX: APPENDECTOMY: SHX54

## 2013-08-22 HISTORY — DX: Hypothyroidism, unspecified: E03.9

## 2013-08-22 HISTORY — DX: Other chronic pain: G89.29

## 2013-08-22 HISTORY — DX: Anemia, unspecified: D64.9

## 2013-08-22 HISTORY — DX: Cardiac arrhythmia, unspecified: I49.9

## 2013-08-22 HISTORY — DX: Supraventricular tachycardia: I47.1

## 2013-08-22 HISTORY — DX: Age-related osteoporosis without current pathological fracture: M81.0

## 2013-08-22 HISTORY — DX: Gastro-esophageal reflux disease without esophagitis: K21.9

## 2013-08-22 LAB — CBC WITH DIFFERENTIAL/PLATELET
BASOS ABS: 0 10*3/uL (ref 0.0–0.1)
Basophils Relative: 0 % (ref 0–1)
EOS PCT: 1 % (ref 0–5)
Eosinophils Absolute: 0.1 10*3/uL (ref 0.0–0.7)
HCT: 36 % (ref 36.0–46.0)
Hemoglobin: 12.2 g/dL (ref 12.0–15.0)
LYMPHS ABS: 1 10*3/uL (ref 0.7–4.0)
Lymphocytes Relative: 8 % — ABNORMAL LOW (ref 12–46)
MCH: 29.4 pg (ref 26.0–34.0)
MCHC: 33.9 g/dL (ref 30.0–36.0)
MCV: 86.7 fL (ref 78.0–100.0)
MONO ABS: 0.8 10*3/uL (ref 0.1–1.0)
Monocytes Relative: 6 % (ref 3–12)
Neutro Abs: 10.9 10*3/uL — ABNORMAL HIGH (ref 1.7–7.7)
Neutrophils Relative %: 85 % — ABNORMAL HIGH (ref 43–77)
PLATELETS: 251 10*3/uL (ref 150–400)
RBC: 4.15 MIL/uL (ref 3.87–5.11)
RDW: 15.1 % (ref 11.5–15.5)
WBC: 12.9 10*3/uL — ABNORMAL HIGH (ref 4.0–10.5)

## 2013-08-22 LAB — COMPREHENSIVE METABOLIC PANEL
ALT: 6 U/L (ref 0–35)
AST: 14 U/L (ref 0–37)
Albumin: 3.5 g/dL (ref 3.5–5.2)
Alkaline Phosphatase: 65 U/L (ref 39–117)
BUN: 32 mg/dL — ABNORMAL HIGH (ref 6–23)
CALCIUM: 9.6 mg/dL (ref 8.4–10.5)
CO2: 23 meq/L (ref 19–32)
Chloride: 109 mEq/L (ref 96–112)
Creatinine, Ser: 0.88 mg/dL (ref 0.50–1.10)
GFR, EST AFRICAN AMERICAN: 65 mL/min — AB (ref >90)
GFR, EST NON AFRICAN AMERICAN: 56 mL/min — AB (ref >90)
GLUCOSE: 100 mg/dL — AB (ref 70–99)
Potassium: 4.1 mEq/L (ref 3.7–5.3)
Sodium: 147 mEq/L (ref 137–147)
TOTAL PROTEIN: 6.6 g/dL (ref 6.0–8.3)
Total Bilirubin: 0.3 mg/dL (ref 0.3–1.2)

## 2013-08-22 LAB — URINALYSIS, ROUTINE W REFLEX MICROSCOPIC
BILIRUBIN URINE: NEGATIVE
Glucose, UA: NEGATIVE mg/dL
Hgb urine dipstick: NEGATIVE
Ketones, ur: NEGATIVE mg/dL
NITRITE: NEGATIVE
Protein, ur: NEGATIVE mg/dL
Specific Gravity, Urine: 1.015 (ref 1.005–1.030)
UROBILINOGEN UA: 0.2 mg/dL (ref 0.0–1.0)
pH: 5 (ref 5.0–8.0)

## 2013-08-22 LAB — URINE MICROSCOPIC-ADD ON

## 2013-08-22 LAB — CG4 I-STAT (LACTIC ACID): Lactic Acid, Venous: 1.6 mmol/L (ref 0.5–2.2)

## 2013-08-22 LAB — OCCULT BLOOD, POC DEVICE: Fecal Occult Bld: NEGATIVE

## 2013-08-22 SURGERY — APPENDECTOMY, LAPAROSCOPIC
Anesthesia: General | Site: Abdomen

## 2013-08-22 MED ORDER — LEVOTHYROXINE SODIUM 50 MCG PO TABS
50.0000 ug | ORAL_TABLET | Freq: Every day | ORAL | Status: DC
  Administered 2013-08-23 – 2013-08-24 (×2): 50 ug via ORAL
  Filled 2013-08-22 (×4): qty 1

## 2013-08-22 MED ORDER — PROPOFOL 10 MG/ML IV BOLUS
INTRAVENOUS | Status: DC | PRN
  Administered 2013-08-22: 100 mg via INTRAVENOUS

## 2013-08-22 MED ORDER — SODIUM CHLORIDE 0.9 % IR SOLN
Status: DC | PRN
  Administered 2013-08-22: 1000 mL

## 2013-08-22 MED ORDER — SUCCINYLCHOLINE CHLORIDE 20 MG/ML IJ SOLN
INTRAMUSCULAR | Status: DC | PRN
  Administered 2013-08-22: 100 mg via INTRAVENOUS

## 2013-08-22 MED ORDER — OXYCODONE HCL 5 MG/5ML PO SOLN: 5.0000 mg | Freq: Once | ORAL | Status: DC | PRN

## 2013-08-22 MED ORDER — FENTANYL CITRATE 0.05 MG/ML IJ SOLN: 50.0000 ug | Freq: Once | INTRAMUSCULAR | Status: DC

## 2013-08-22 MED ORDER — LACTATED RINGERS IV SOLN
INTRAVENOUS | Status: DC
  Administered 2013-08-22: 15:00:00 via INTRAVENOUS

## 2013-08-22 MED ORDER — SODIUM CHLORIDE 0.9 % IV SOLN
INTRAVENOUS | Status: DC
  Administered 2013-08-22: 09:00:00 via INTRAVENOUS

## 2013-08-22 MED ORDER — LATANOPROST 0.005 % OP SOLN
1.0000 [drp] | Freq: Every day | OPHTHALMIC | Status: DC
  Administered 2013-08-22 – 2013-08-23 (×2): 1 [drp] via OPHTHALMIC
  Filled 2013-08-22: qty 2.5

## 2013-08-22 MED ORDER — IOHEXOL 300 MG/ML  SOLN
25.0000 mL | INTRAMUSCULAR | Status: DC | PRN
  Administered 2013-08-22: 25 mL via ORAL

## 2013-08-22 MED ORDER — DIAZEPAM 5 MG PO TABS: 2.5000 mg | ORAL_TABLET | Freq: Four times a day (QID) | ORAL | Status: DC | PRN

## 2013-08-22 MED ORDER — ONDANSETRON HCL 4 MG PO TABS: 4.0000 mg | ORAL_TABLET | Freq: Four times a day (QID) | ORAL | Status: DC | PRN

## 2013-08-22 MED ORDER — GLYCOPYRROLATE 0.2 MG/ML IJ SOLN
INTRAMUSCULAR | Status: DC | PRN
  Administered 2013-08-22: 0.4 mg via INTRAVENOUS

## 2013-08-22 MED ORDER — ONDANSETRON HCL 4 MG/2ML IJ SOLN
4.0000 mg | Freq: Once | INTRAMUSCULAR | Status: AC
  Administered 2013-08-22: 4 mg via INTRAVENOUS
  Filled 2013-08-22: qty 2

## 2013-08-22 MED ORDER — LACTATED RINGERS IV SOLN
INTRAVENOUS | Status: DC
  Administered 2013-08-24: 01:00:00 via INTRAVENOUS

## 2013-08-22 MED ORDER — METOPROLOL TARTRATE 50 MG PO TABS
50.0000 mg | ORAL_TABLET | Freq: Two times a day (BID) | ORAL | Status: DC
  Administered 2013-08-23 – 2013-08-24 (×3): 50 mg via ORAL
  Filled 2013-08-22 (×5): qty 1

## 2013-08-22 MED ORDER — ENOXAPARIN SODIUM 40 MG/0.4ML ~~LOC~~ SOLN
40.0000 mg | SUBCUTANEOUS | Status: DC
  Administered 2013-08-23 – 2013-08-24 (×2): 40 mg via SUBCUTANEOUS
  Filled 2013-08-22 (×3): qty 0.4

## 2013-08-22 MED ORDER — IOHEXOL 300 MG/ML  SOLN
80.0000 mL | Freq: Once | INTRAMUSCULAR | Status: AC | PRN
  Administered 2013-08-22: 80 mL via INTRAVENOUS

## 2013-08-22 MED ORDER — ONDANSETRON HCL 4 MG/2ML IJ SOLN
INTRAMUSCULAR | Status: AC
  Filled 2013-08-22: qty 2

## 2013-08-22 MED ORDER — FENTANYL CITRATE 0.05 MG/ML IJ SOLN
INTRAMUSCULAR | Status: DC | PRN
  Administered 2013-08-22: 50 ug via INTRAVENOUS

## 2013-08-22 MED ORDER — BUPIVACAINE-EPINEPHRINE 0.25% -1:200000 IJ SOLN
INTRAMUSCULAR | Status: DC | PRN
  Administered 2013-08-22: 30 mL

## 2013-08-22 MED ORDER — MORPHINE SULFATE 2 MG/ML IJ SOLN
1.0000 mg | INTRAMUSCULAR | Status: DC | PRN
  Administered 2013-08-23: 2 mg via INTRAVENOUS
  Filled 2013-08-22: qty 1

## 2013-08-22 MED ORDER — MORPHINE SULFATE 2 MG/ML IJ SOLN
2.0000 mg | Freq: Once | INTRAMUSCULAR | Status: AC
  Administered 2013-08-22: 2 mg via INTRAVENOUS
  Filled 2013-08-22: qty 1

## 2013-08-22 MED ORDER — OXYCODONE-ACETAMINOPHEN 5-325 MG PO TABS: 1.0000 | ORAL_TABLET | ORAL | Status: DC | PRN

## 2013-08-22 MED ORDER — MORPHINE SULFATE 4 MG/ML IJ SOLN
4.0000 mg | Freq: Once | INTRAMUSCULAR | Status: AC
  Administered 2013-08-22: 4 mg via INTRAVENOUS
  Filled 2013-08-22: qty 1

## 2013-08-22 MED ORDER — ONDANSETRON HCL 4 MG/2ML IJ SOLN: 4.0000 mg | Freq: Four times a day (QID) | INTRAMUSCULAR | Status: DC | PRN

## 2013-08-22 MED ORDER — ONDANSETRON HCL 4 MG/2ML IJ SOLN
INTRAMUSCULAR | Status: DC | PRN
  Administered 2013-08-22: 4 mg via INTRAVENOUS

## 2013-08-22 MED ORDER — ONDANSETRON HCL 4 MG/2ML IJ SOLN
4.0000 mg | INTRAMUSCULAR | Status: DC | PRN
  Administered 2013-08-22: 4 mg via INTRAVENOUS

## 2013-08-22 MED ORDER — SERTRALINE HCL 50 MG PO TABS
50.0000 mg | ORAL_TABLET | Freq: Every day | ORAL | Status: DC
  Administered 2013-08-23 – 2013-08-24 (×2): 50 mg via ORAL
  Filled 2013-08-22 (×2): qty 1

## 2013-08-22 MED ORDER — PIPERACILLIN-TAZOBACTAM 3.375 G IVPB
3.3750 g | Freq: Three times a day (TID) | INTRAVENOUS | Status: DC
  Administered 2013-08-22 – 2013-08-24 (×5): 3.375 g via INTRAVENOUS
  Filled 2013-08-22 (×7): qty 50

## 2013-08-22 MED ORDER — BUPIVACAINE-EPINEPHRINE (PF) 0.25% -1:200000 IJ SOLN
INTRAMUSCULAR | Status: AC
  Filled 2013-08-22: qty 30

## 2013-08-22 MED ORDER — LIDOCAINE HCL (CARDIAC) 20 MG/ML IV SOLN
INTRAVENOUS | Status: DC | PRN
  Administered 2013-08-22: 40 mg via INTRAVENOUS

## 2013-08-22 MED ORDER — DEXTROSE 5 % IV SOLN
3.0000 g | INTRAVENOUS | Status: DC | PRN
  Administered 2013-08-22: 2 g via INTRAVENOUS

## 2013-08-22 MED ORDER — FENTANYL CITRATE 0.05 MG/ML IJ SOLN: 25.0000 ug | INTRAMUSCULAR | Status: DC | PRN

## 2013-08-22 MED ORDER — PHENYLEPHRINE HCL 10 MG/ML IJ SOLN
INTRAMUSCULAR | Status: DC | PRN
  Administered 2013-08-22 (×2): 40 ug via INTRAVENOUS
  Administered 2013-08-22 (×4): 80 ug via INTRAVENOUS

## 2013-08-22 MED ORDER — NEOSTIGMINE METHYLSULFATE 1 MG/ML IJ SOLN
INTRAMUSCULAR | Status: DC | PRN
  Administered 2013-08-22: 3 mg via INTRAVENOUS

## 2013-08-22 MED ORDER — ACETAMINOPHEN 325 MG PO TABS
650.0000 mg | ORAL_TABLET | ORAL | Status: DC | PRN
  Administered 2013-08-23 – 2013-08-24 (×2): 650 mg via ORAL
  Filled 2013-08-22 (×2): qty 2

## 2013-08-22 MED ORDER — OXYCODONE HCL 5 MG PO TABS: 5.0000 mg | ORAL_TABLET | Freq: Once | ORAL | Status: DC | PRN

## 2013-08-22 MED ORDER — LACTATED RINGERS IV SOLN
INTRAVENOUS | Status: DC | PRN
  Administered 2013-08-22 (×2): via INTRAVENOUS

## 2013-08-22 MED ORDER — ROCURONIUM BROMIDE 100 MG/10ML IV SOLN
INTRAVENOUS | Status: DC | PRN
Start: 2013-08-22 — End: 2013-08-22
  Administered 2013-08-22: 20 mg via INTRAVENOUS
  Administered 2013-08-22: 10 mg via INTRAVENOUS

## 2013-08-22 MED ORDER — DILTIAZEM HCL ER 120 MG PO CP24
120.0000 mg | ORAL_CAPSULE | Freq: Every day | ORAL | Status: DC
  Administered 2013-08-23 – 2013-08-24 (×2): 120 mg via ORAL
  Filled 2013-08-22 (×3): qty 1

## 2013-08-22 MED ORDER — LISINOPRIL 20 MG PO TABS
20.0000 mg | ORAL_TABLET | Freq: Every day | ORAL | Status: DC
  Administered 2013-08-23 – 2013-08-24 (×2): 20 mg via ORAL
  Filled 2013-08-22 (×2): qty 1

## 2013-08-22 SURGICAL SUPPLY — 46 items
APPLIER CLIP ROT 10 11.4 M/L (STAPLE)
BENZOIN TINCTURE PRP APPL 2/3 (GAUZE/BANDAGES/DRESSINGS) ×2 IMPLANT
BLADE SURG ROTATE 9660 (MISCELLANEOUS) IMPLANT
CANISTER SUCTION 2500CC (MISCELLANEOUS) ×2 IMPLANT
CHLORAPREP W/TINT 26ML (MISCELLANEOUS) ×2 IMPLANT
CLIP APPLIE ROT 10 11.4 M/L (STAPLE) IMPLANT
COVER SURGICAL LIGHT HANDLE (MISCELLANEOUS) ×2 IMPLANT
CUTTER FLEX LINEAR 45M (STAPLE) ×2 IMPLANT
DECANTER SPIKE VIAL GLASS SM (MISCELLANEOUS) ×2 IMPLANT
DRAPE UTILITY 15X26 W/TAPE STR (DRAPE) ×4 IMPLANT
DRESSING OPSITE X SMALL 2X3 (GAUZE/BANDAGES/DRESSINGS) ×2 IMPLANT
DRSG OPSITE POSTOP 3X4 (GAUZE/BANDAGES/DRESSINGS) ×2 IMPLANT
ELECT REM PT RETURN 9FT ADLT (ELECTROSURGICAL) ×2
ELECTRODE REM PT RTRN 9FT ADLT (ELECTROSURGICAL) ×1 IMPLANT
ENDOLOOP SUT PDS II  0 18 (SUTURE)
ENDOLOOP SUT PDS II 0 18 (SUTURE) IMPLANT
FILTER SMOKE EVAC LAPAROSHD (FILTER) ×2 IMPLANT
GAUZE SPONGE 2X2 8PLY STRL LF (GAUZE/BANDAGES/DRESSINGS) ×1 IMPLANT
GLOVE BIO SURGEON STRL SZ7 (GLOVE) ×2 IMPLANT
GLOVE BIOGEL PI IND STRL 6.5 (GLOVE) ×2 IMPLANT
GLOVE BIOGEL PI IND STRL 7.5 (GLOVE) ×1 IMPLANT
GLOVE BIOGEL PI INDICATOR 6.5 (GLOVE) ×2
GLOVE BIOGEL PI INDICATOR 7.5 (GLOVE) ×1
GLOVE SKINSENSE NS SZ7.0 (GLOVE) ×2
GLOVE SKINSENSE STRL SZ7.0 (GLOVE) ×2 IMPLANT
GOWN STRL NON-REIN LRG LVL3 (GOWN DISPOSABLE) ×6 IMPLANT
KIT BASIN OR (CUSTOM PROCEDURE TRAY) ×2 IMPLANT
KIT ROOM TURNOVER OR (KITS) ×2 IMPLANT
NS IRRIG 1000ML POUR BTL (IV SOLUTION) ×2 IMPLANT
PAD ARMBOARD 7.5X6 YLW CONV (MISCELLANEOUS) ×4 IMPLANT
POUCH SPECIMEN RETRIEVAL 10MM (ENDOMECHANICALS) ×2 IMPLANT
RELOAD STAPLE TA45 3.5 REG BLU (ENDOMECHANICALS) ×2 IMPLANT
SCALPEL HARMONIC ACE (MISCELLANEOUS) ×2 IMPLANT
SET IRRIG TUBING LAPAROSCOPIC (IRRIGATION / IRRIGATOR) ×2 IMPLANT
SPECIMEN JAR SMALL (MISCELLANEOUS) ×2 IMPLANT
SPONGE GAUZE 2X2 STER 10/PKG (GAUZE/BANDAGES/DRESSINGS) ×1
STRIP CLOSURE SKIN 1/2X4 (GAUZE/BANDAGES/DRESSINGS) ×2 IMPLANT
SUT MNCRL AB 4-0 PS2 18 (SUTURE) ×2 IMPLANT
SUT VICRYL 0 UR6 27IN ABS (SUTURE) ×2 IMPLANT
TOWEL OR 17X24 6PK STRL BLUE (TOWEL DISPOSABLE) ×2 IMPLANT
TOWEL OR 17X26 10 PK STRL BLUE (TOWEL DISPOSABLE) ×2 IMPLANT
TRAY FOLEY CATH 16FR SILVER (SET/KITS/TRAYS/PACK) IMPLANT
TRAY LAPAROSCOPIC (CUSTOM PROCEDURE TRAY) ×2 IMPLANT
TROCAR XCEL BLADELESS 5X75MML (TROCAR) ×4 IMPLANT
TROCAR XCEL BLUNT TIP 100MML (ENDOMECHANICALS) ×2 IMPLANT
WATER STERILE IRR 1000ML POUR (IV SOLUTION) IMPLANT

## 2013-08-22 NOTE — Anesthesia Preprocedure Evaluation (Signed)
Anesthesia Evaluation  Patient identified by MRN, date of birth, ID band Patient awake    Reviewed: Allergy & Precautions, H&P , NPO status , Patient's Chart, lab work & pertinent test results  Airway Mallampati: I TM Distance: <3 FB Neck ROM: Full    Dental   Pulmonary  breath sounds clear to auscultation        Cardiovascular hypertension, Rhythm:Regular Rate:Tachycardia  prox supraventric tach   Neuro/Psych Anxiety Depression Chronic back pain    GI/Hepatic abd pain   Endo/Other  Hypothyroidism   Renal/GU      Musculoskeletal   Abdominal (+)  Abdomen: tender.    Peds  Hematology   Anesthesia Other Findings   Reproductive/Obstetrics                           Anesthesia Physical Anesthesia Plan  ASA: III and emergent  Anesthesia Plan: General   Post-op Pain Management:    Induction: Intravenous  Airway Management Planned: Oral ETT  Additional Equipment:   Intra-op Plan:   Post-operative Plan: Extubation in OR  Informed Consent: I have reviewed the patients History and Physical, chart, labs and discussed the procedure including the risks, benefits and alternatives for the proposed anesthesia with the patient or authorized representative who has indicated his/her understanding and acceptance.     Plan Discussed with: CRNA and Surgeon  Anesthesia Plan Comments:         Anesthesia Quick Evaluation

## 2013-08-22 NOTE — Op Note (Signed)
Appendectomy, Lap, Procedure Note  Indications: The patient presented with a history of right-sided abdominal pain. A CT scan revealed findings consistent with acute appendicitis.  Pre-operative Diagnosis: Acute appendicitis without mention of peritonitis  Post-operative Diagnosis: Same  Surgeon: Carl Butner K.   Assistants: none  Anesthesia: General endotracheal anesthesia  ASA Class: 2  Procedure Details  The patient was seen again in the Holding Room. The risks, benefits, complications, treatment options, and expected outcomes were discussed with the patient and/or family. The possibilities of reaction to medication, perforation of viscus, bleeding, recurrent infection, finding a normal appendix, the need for additional procedures, failure to diagnose a condition, and creating a complication requiring transfusion or operation were discussed. There was concurrence with the proposed plan and informed consent was obtained. The site of surgery was properly noted. The patient was taken to Operating Room, identified as [facility] and the procedure verified as Appendectomy. A Time Out was held and the above information confirmed.  The patient was placed in the supine position and general anesthesia was induced.  The abdomen was prepped and draped in a sterile fashion. A one centimeter infraumbilical incision was made.  Dissection was carried down to the fascia bluntly.  The fascia was incised vertically.  We entered the peritoneal cavity bluntly.  A pursestring suture was passed around the incision with a 0 Vicryl.  The Hasson cannula was introduced into the abdomen and the tails of the suture were used to hold the Hasson in place.   The pneumoperitoneum was then established maintaining a maximum pressure of 15 mmHg.  Additional 5 mm cannulas then placed in the left lower quadrant of the abdomen and the right upper quadrant under direct visualization. A careful evaluation of the entire  abdomen was carried out. The patient was placed in Trendelenburg and left lateral decubitus position.  The scope was moved to the right upper quadrant port site. The cecum was mobilized medially.  The appendix was quite swollen and edematous, but there was no sign of perforation.  Some adherent omentum was peeled away from the appendix.  The appendix was carefully dissected. The appendix was was skeletonized with the harmonic scalpel.   The appendix was divided at its base using an endo-GIA stapler. Minimal appendiceal stump was left in place. There was no evidence of bleeding, leakage, or complication after division of the appendix. Irrigation was also performed and irrigate suctioned from the abdomen as well.  The umbilical port site was closed with the purse string suture. There was no residual palpable fascial defect.  The trocar site skin wounds were closed with 4-0 Monocryl.  Instrument, sponge, and needle counts were correct at the conclusion of the case.   Findings: The appendix was found to be inflamed. There were not signs of necrosis.  There was not perforation. There was not abscess formation.  Estimated Blood Loss:  Minimal         Drains: none         Specimens: Appendix         Complications:  None; patient tolerated the procedure well.         Disposition: PACU - hemodynamically stable.         Condition: stable  Imogene Burn. [name], MD, [facility] Surgery  General/ Trauma Surgery  08/22/2013 5:34 PM

## 2013-08-22 NOTE — H&P (Signed)
[name] 1921/04/14  [id number].   Primary Care MD: Dr. Wenda Low Chief Complaint/Reason for Consult: acute appendicitis HPI: This is a 60 white female who woke up around 0100am this morning with mid abdominal pain.  She denies nausea or vomiting.  No fevers.  Her pain persisted and migrated towards the RLQ.  She presented to [facility] where she underwent a CT scan that revealed acute appendicitis.  We have been asked to see for admission.  ROS: Please see HPI, otherwise all other systems have been reviewed and are negative.  History reviewed. No pertinent family history.  Past Medical History  Diagnosis Date  . Hypertension   . Chronic back pain     lower back  . Hypothyroidism   . Paroxysmal supraventricular tachycardia   . Anemia   . Osteoporosis   . Mixed hyperlipidemia     Past Surgical History  Procedure Laterality Date  . Abdominal hysterectomy    . Throat surgery      x2  . Back surgery      Social History:  reports that she has never smoked. She does not have any smokeless tobacco history on file. She reports that she does not drink alcohol or use illicit drugs.  Allergies: No Known Allergies   (Not in a hospital admission)  Blood pressure 144/60, pulse 104, temperature 98.4 F (36.9 C), temperature source Oral, resp. rate 20, height '4\' 11"'  (1.499 m), weight 118 lb (53.524 kg), SpO2 100.00%. Physical Exam: General: pleasant, WD, WN white female who is laying in bed in NAD HEENT: head is normocephalic, atraumatic.  Sclera are noninjected.  PERRL.  Ears and nose without any masses or lesions.  Mouth is pink and moist Heart: regular, rate, and rhythm.  Normal s1,s2. No obvious murmurs, gallops, or rubs noted.  Palpable radial and pedal pulses bilaterally Lungs: CTAB, no wheezes, rhonchi, or rales noted.  Respiratory effort nonlabored Abd: soft, tender in RLQ at McBurney's point, mild distention, +BS, no masses, hernias, or organomegaly MS: all 4  extremities are symmetrical with no cyanosis, clubbing, or edema. Skin: warm and dry with no masses, lesions, or rashes Psych: A&Ox3 with an appropriate affect.    Results for orders placed during the hospital encounter of 08/22/13 (from the past 48 hour(s))  URINALYSIS, ROUTINE W REFLEX MICROSCOPIC     Status: Abnormal   Collection Time    08/22/13  8:25 AM      Result Value Range   Color, Urine YELLOW  YELLOW   APPearance CLEAR  CLEAR   Specific Gravity, Urine 1.015  1.005 - 1.030   pH 5.0  5.0 - 8.0   Glucose, UA NEGATIVE  NEGATIVE mg/dL   Hgb urine dipstick NEGATIVE  NEGATIVE   Bilirubin Urine NEGATIVE  NEGATIVE   Ketones, ur NEGATIVE  NEGATIVE mg/dL   Protein, ur NEGATIVE  NEGATIVE mg/dL   Urobilinogen, UA 0.2  0.0 - 1.0 mg/dL   Nitrite NEGATIVE  NEGATIVE   Leukocytes, UA TRACE (*) NEGATIVE  URINE MICROSCOPIC-ADD ON     Status: Abnormal   Collection Time    08/22/13  8:25 AM      Result Value Range   Squamous Epithelial / LPF RARE  RARE   WBC, UA 7-10  <3 WBC/hpf   Bacteria, UA FEW (*) RARE  CBC WITH DIFFERENTIAL     Status: Abnormal   Collection Time    08/22/13  8:40 AM      Result Value Range  WBC 12.9 (*) 4.0 - 10.5 K/uL   RBC 4.15  3.87 - 5.11 MIL/uL   Hemoglobin 12.2  12.0 - 15.0 g/dL   HCT 36.0  36.0 - 46.0 %   MCV 86.7  78.0 - 100.0 fL   MCH 29.4  26.0 - 34.0 pg   MCHC 33.9  30.0 - 36.0 g/dL   RDW 15.1  11.5 - 15.5 %   Platelets 251  150 - 400 K/uL   Neutrophils Relative % 85 (*) 43 - 77 %   Neutro Abs 10.9 (*) 1.7 - 7.7 K/uL   Lymphocytes Relative 8 (*) 12 - 46 %   Lymphs Abs 1.0  0.7 - 4.0 K/uL   Monocytes Relative 6  3 - 12 %   Monocytes Absolute 0.8  0.1 - 1.0 K/uL   Eosinophils Relative 1  0 - 5 %   Eosinophils Absolute 0.1  0.0 - 0.7 K/uL   Basophils Relative 0  0 - 1 %   Basophils Absolute 0.0  0.0 - 0.1 K/uL  COMPREHENSIVE METABOLIC PANEL     Status: Abnormal   Collection Time    08/22/13  8:40 AM      Result Value Range   Sodium 147  137  - 147 mEq/L   Comment: Please note change in reference range.   Potassium 4.1  3.7 - 5.3 mEq/L   Comment: Please note change in reference range.   Chloride 109  96 - 112 mEq/L   CO2 23  19 - 32 mEq/L   Glucose, Bld 100 (*) 70 - 99 mg/dL   BUN 32 (*) 6 - 23 mg/dL   Creatinine, Ser 0.88  0.50 - 1.10 mg/dL   Calcium 9.6  8.4 - 10.5 mg/dL   Total Protein 6.6  6.0 - 8.3 g/dL   Albumin 3.5  3.5 - 5.2 g/dL   AST 14  0 - 37 U/L   ALT 6  0 - 35 U/L   Alkaline Phosphatase 65  39 - 117 U/L   Total Bilirubin 0.3  0.3 - 1.2 mg/dL   GFR calc non Af Amer 56 (*) >90 mL/min   GFR calc Af Amer 65 (*) >90 mL/min   Comment: (NOTE)     The eGFR has been calculated using the CKD EPI equation.     This calculation has not been validated in all clinical situations.     eGFR's persistently <90 mL/min signify possible Chronic Kidney     Disease.  CG4 I-STAT (LACTIC ACID)     Status: None   Collection Time    08/22/13  8:51 AM      Result Value Range   Lactic Acid, Venous 1.60  0.5 - 2.2 mmol/L  OCCULT BLOOD, POC DEVICE     Status: None   Collection Time    08/22/13 10:48 AM      Result Value Range   Fecal Occult Bld NEGATIVE  NEGATIVE   Ct Abdomen Pelvis W Contrast  08/22/2013   CLINICAL DATA:  Right lower quadrant pain.  EXAM: CT ABDOMEN AND PELVIS WITH CONTRAST  TECHNIQUE: Multidetector CT imaging of the abdomen and pelvis was performed using the standard protocol following bolus administration of intravenous contrast.  CONTRAST:  21m OMNIPAQUE IOHEXOL 300 MG/ML  SOLN  COMPARISON:  06/03/2007 and 03/31/2004  FINDINGS: Lung bases demonstrate very minimal bibasilar dependent atelectasis.  Abdominal images demonstrate a few small sub cm hypodensities within the liver likely cysts. Single gallstone is present.  There is a well-defined 3.5 cm hypodensity over the spleen which is new and has Hounsfield unit measurements of 24. Pancreas and adrenal glands are within normal. Kidneys are normal in size with a few  small sub cm hypodensities likely cysts but too small to characterize. There is focal cortical scarring over the lower pole of the right kidney. Ureters are within normal. There is calcified and noncalcified plaque of the thoracoabdominal aorta and iliac vessels. There is diverticulosis of the colon most prominent over the sigmoid colon.  The appendix is inflamed measuring 1.1 cm in diameter with minimal adjacent inflammatory change and fluid near the tip. Note that the appendix extends posteriorly to lie within the right posterior pelvis. No definite perforation or adjacent abscess.  Remaining pelvic structures are notable for a prior hysterectomy. Bladder is unremarkable. There are degenerative changes of the spine and hips.  IMPRESSION: Evidence of acute appendicitis. No evidence of perforation or abscess.  New well-defined 3.5 cm hypodensity over the spleen. This likely represents a benign process as recommend ultrasound for further evaluation.  Several small subcentimeter liver hypodensities likely cysts. Several small bilateral renal hypodensities too small to characterize but likely a cyst. Minimal right renal scarring.  Single gallstone.  Moderate diverticulosis of the colon.  Critical Value/emergent results were called by telephone at the time of interpretation on 08/22/2013 at 12:03 PM to Dr. Carlisle Cater , who verbally acknowledged these results.   Electronically Signed   By: Marin Olp M.D.   On: 08/22/2013 12:03       Assessment/Plan 1. Acute appendicitis 2. Paroxsymal SVT 3. HTN 4. Hypothyroidism 5. 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 Chronic back pain  Plan: 1. The patient will be admitted and started on IV abx therapy and plan for lap appy tonight.  She takes half of a plavix every other day for what she states is some vessel disease seen on MRI that her PCP thought may have been causing her to have vertigo.  (she went to therapy for this and  it has resolved.  Unsure she still really needs to be on plavix)  I have d/w her and her daughter that the patient is at higher risk for bleeding complications because of her plavix use and that we can not reverse this.  I have also explained the procedure, including expected outcome, risks, complications, and answered all questions.  The patient did contemplate conservative treatment with just abx therapy alone, but decided to pursue an operation.  She is NPO and plans for surgery tonight.  Clayten Allcock E 08/22/2013, 1:44 PM Pager: [phone]

## 2013-08-22 NOTE — ED Notes (Signed)
Pt belongings sent with daughter

## 2013-08-22 NOTE — ED Notes (Signed)
Lactic acid results shown to Alvera Singh, PA-C

## 2013-08-22 NOTE — Transfer of Care (Signed)
Immediate Anesthesia Transfer of Care Note  Patient: [name]  Procedure(s) Performed: Procedure(s): APPENDECTOMY LAPAROSCOPIC (N/A)  Patient Location: PACU  Anesthesia Type:General  Level of Consciousness: awake, alert  and oriented  Airway & Oxygen Therapy: Patient Spontanous Breathing  Post-op Assessment: Report given to PACU RN  Post vital signs: Reviewed and stable  Complications: No apparent anesthesia complications

## 2013-08-22 NOTE — H&P (Signed)
Agree with above.  Recommend immediate laparoscopic appendectomy.  The surgical procedure has been discussed with the patient.  Potential risks, benefits, alternative treatments, and expected outcomes have been explained.  All of the patient's questions at this time have been answered.  The likelihood of reaching the patient's treatment goal is good.  The patient understand the proposed surgical procedure and wishes to proceed.   Imogene Burn. [name], MD, [facility] Surgery  General/ Trauma Surgery  08/22/2013 2:19 PM

## 2013-08-22 NOTE — ED Provider Notes (Signed)
CSN: [id number]     Arrival date & time 08/22/13  0746 History   First MD Initiated Contact with Patient 08/22/13 [phone]     Chief Complaint  Patient presents with  . Abdominal Pain   (Consider location/radiation/quality/duration/timing/severity/associated sxs/prior Treatment) HPI Comments: Patient with history of hysterectomy, lower back pain s/p spinal injection 2 weeks ago, Plavix use - presents with complaint of lower abdominal pain that began at approximately 1 AM. Patient denies fever, nausea, vomiting, diarrhea. She had one hard small dark stool this morning. She denies urinary symptoms. No treatments prior to arrival. History of colonoscopy showing diverticulosis. Negative EGD in past decade. Onset of symptoms acute. Course is constant. Nothing makes symptoms better or worse.  Patient is a 81 y.o. female presenting with abdominal pain. The history is provided by the patient.  Abdominal Pain Associated symptoms: no chest pain, no cough, no diarrhea, no dysuria, no fever, no nausea, no sore throat and no vomiting     Past Medical History  Diagnosis Date  . Hypertension   . Pinched nerve     lower back   Past Surgical History  Procedure Laterality Date  . Abdominal hysterectomy     No family history on file. History  Substance Use Topics  . Smoking status: Never Smoker   . Smokeless tobacco: Not on file  . Alcohol Use: No   OB History   Grav Para Term Preterm Abortions TAB SAB Ect Mult Living                 Review of Systems  Constitutional: Negative for fever.  HENT: Negative for rhinorrhea and sore throat.   Eyes: Negative for redness.  Respiratory: Negative for cough.   Cardiovascular: Negative for chest pain.  Gastrointestinal: Positive for abdominal pain. Negative for nausea, vomiting and diarrhea.  Genitourinary: Negative for dysuria.  Musculoskeletal: Negative for myalgias.  Skin: Negative for rash.  Neurological: Negative for headaches.    Allergies   Review of patient's allergies indicates no known allergies.  Home Medications   Current Outpatient Rx  Name  Route  Sig  Dispense  Refill  . BIOTIN PO   Oral   Take 1 tablet by mouth daily.         . butalbital-acetaminophen-caffeine (FIORICET, ESGIC) 50-325-40 MG per tablet   Oral   Take 1 tablet by mouth every 4 (four) hours as needed for headache.         . Calcium Carbonate-Vitamin D (CALCIUM 600+D) 600-200 MG-UNIT TABS   Oral   Take 1 tablet by mouth daily.         . clopidogrel (PLAVIX) 75 MG tablet   Oral   Take 37.5 mg by mouth every other day.         . diazepam (VALIUM) 5 MG tablet   Oral   Take 2.5 mg by mouth every 6 (six) hours as needed for anxiety.         Marland Kitchen diltiazem (DILACOR XR) 120 MG 24 hr capsule   Oral   Take 120 mg by mouth daily.         . fexofenadine (ALLEGRA) 180 MG tablet   Oral   Take 180 mg by mouth daily.         Marland Kitchen FLUOCINONIDE EX   Apply externally   Apply 2 drops topically daily.         Marland Kitchen gemfibrozil (LOPID) 600 MG tablet   Oral   Take 600 mg by mouth  2 (two) times daily before a meal.         . latanoprost (XALATAN) 0.005 % ophthalmic solution   Both Eyes   Place 1 drop into both eyes at bedtime.         Marland Kitchen levothyroxine (SYNTHROID, LEVOTHROID) 50 MCG tablet   Oral   Take 50 mcg by mouth daily before breakfast.         . lisinopril (PRINIVIL,ZESTRIL) 20 MG tablet   Oral   Take 20 mg by mouth daily.         . meclizine (ANTIVERT) 25 MG tablet   Oral   Take 25 mg by mouth 3 (three) times daily as needed for dizziness.         . metoprolol (LOPRESSOR) 50 MG tablet   Oral   Take 50 mg by mouth 2 (two) times daily.         Marland Kitchen nystatin-triamcinolone ointment (MYCOLOG)   Topical   Apply 1 application topically 2 (two) times daily.         . Omega-3 Fatty Acids (FISH OIL) 1000 MG CAPS   Oral   Take 1 capsule by mouth 2 (two) times daily.         Marland Kitchen omeprazole (PRILOSEC) 20 MG capsule    Oral   Take 20 mg by mouth daily.         . sertraline (ZOLOFT) 100 MG tablet   Oral   Take 50 mg by mouth daily.          BP 161/48  Pulse 61  Resp 20  SpO2 97% Physical Exam  Nursing note and vitals reviewed. Constitutional: She appears well-developed and well-nourished.  HENT:  Head: Normocephalic and atraumatic.  Eyes: Conjunctivae are normal. Right eye exhibits no discharge. Left eye exhibits no discharge.  Neck: Normal range of motion. Neck supple.  Cardiovascular: Normal rate, regular rhythm and normal heart sounds.   Pulmonary/Chest: Effort normal and breath sounds normal.  Abdominal: Soft. She exhibits no distension. Bowel sounds are decreased. There is tenderness (moderate) in the right lower quadrant and suprapubic area. There is no rigidity, no rebound, no guarding, no CVA tenderness, no tenderness at McBurney's point and negative Murphy's sign.  Neurological: She is alert.  Skin: Skin is warm and dry.  Psychiatric: She has a normal mood and affect.    ED Course  Procedures (including critical care time) Labs Review Labs Reviewed  CBC WITH DIFFERENTIAL - Abnormal; Notable for the following:    WBC 12.9 (*)    Neutrophils Relative % 85 (*)    Neutro Abs 10.9 (*)    Lymphocytes Relative 8 (*)    All other components within normal limits  COMPREHENSIVE METABOLIC PANEL - Abnormal; Notable for the following:    Glucose, Bld 100 (*)    BUN 32 (*)    GFR calc non Af Amer 56 (*)    GFR calc Af Amer 65 (*)    All other components within normal limits  URINALYSIS, ROUTINE W REFLEX MICROSCOPIC - Abnormal; Notable for the following:    Leukocytes, UA TRACE (*)    All other components within normal limits  URINE MICROSCOPIC-ADD ON - Abnormal; Notable for the following:    Bacteria, UA FEW (*)    All other components within normal limits  URINE CULTURE  OCCULT BLOOD X 1 CARD TO LAB, STOOL  CG4 I-STAT (LACTIC ACID)  OCCULT BLOOD, POC DEVICE   Imaging Review Ct  Abdomen Pelvis  W Contrast  08/22/2013   CLINICAL DATA:  Right lower quadrant pain.  EXAM: CT ABDOMEN AND PELVIS WITH CONTRAST  TECHNIQUE: Multidetector CT imaging of the abdomen and pelvis was performed using the standard protocol following bolus administration of intravenous contrast.  CONTRAST:  75mL OMNIPAQUE IOHEXOL 300 MG/ML  SOLN  COMPARISON:  06/03/2007 and 03/31/2004  FINDINGS: Lung bases demonstrate very minimal bibasilar dependent atelectasis.  Abdominal images demonstrate a few small sub cm hypodensities within the liver likely cysts. Single gallstone is present. There is a well-defined 3.5 cm hypodensity over the spleen which is new and has Hounsfield unit measurements of 24. Pancreas and adrenal glands are within normal. Kidneys are normal in size with a few small sub cm hypodensities likely cysts but too small to characterize. There is focal cortical scarring over the lower pole of the right kidney. Ureters are within normal. There is calcified and noncalcified plaque of the thoracoabdominal aorta and iliac vessels. There is diverticulosis of the colon most prominent over the sigmoid colon.  The appendix is inflamed measuring 1.1 cm in diameter with minimal adjacent inflammatory change and fluid near the tip. Note that the appendix extends posteriorly to lie within the right posterior pelvis. No definite perforation or adjacent abscess.  Remaining pelvic structures are notable for a prior hysterectomy. Bladder is unremarkable. There are degenerative changes of the spine and hips.  IMPRESSION: Evidence of acute appendicitis. No evidence of perforation or abscess.  New well-defined 3.5 cm hypodensity over the spleen. This likely represents a benign process as recommend ultrasound for further evaluation.  Several small subcentimeter liver hypodensities likely cysts. Several small bilateral renal hypodensities too small to characterize but likely a cyst. Minimal right renal scarring.  Single gallstone.   Moderate diverticulosis of the colon.  Critical Value/emergent results were called by telephone at the time of interpretation on 08/22/2013 at 12:03 PM to Dr. Carlisle Cater , who verbally acknowledged these results.   Electronically Signed   By: Marin Olp M.D.   On: 08/22/2013 12:03    EKG Interpretation    Date/Time:  Friday August 22 2013 07:55:21 EST Ventricular Rate:  68 PR Interval:  164 QRS Duration: 87 QT Interval:  397 QTC Calculation: 422 R Axis:   -5 Text Interpretation:  Sinus rhythm Low voltage, precordial leads No significant change since last tracing Confirmed by GOLDSTON  MD, SCOTT ([id number]) on 08/22/2013 8:03:40 AM           8:15 AM Patient seen and examined. D/w Dr. Regenia Skeeter. Work-up initiated. Medications ordered.   Vital signs reviewed and are as follows: Filed Vitals:   08/22/13 0801  BP: 161/48  Pulse: 61  Resp: 20   12:09 PM CT shows appendicitis. Spoke with surgery. Family and patient informed. Pain is controlled.   12:34 PM Pending surgery consult.   BP 134/49  Pulse 93  Temp(Src) 98.4 F (36.9 C) (Oral)  Resp 18  Ht 4\' 11"  (1.499 m)  Wt 118 lb (53.524 kg)  BMI 23.82 kg/m2  SpO2 93%   MDM   1. Acute appendicitis    Admit for appy.     Carlisle Cater, PA-C 08/22/13 1235

## 2013-08-22 NOTE — ED Notes (Signed)
Pt arrived from home by [facility] with c/o left lower abd pain. Tender to palpation on the right lower side. Stated that pain started about 0100 this morning. Hx of chronic lower back pain. Pt has also been having some constipation.

## 2013-08-22 NOTE — OR Nursing (Signed)
Patient voided in short stay per Dr [name] request prior to going to operating room

## 2013-08-23 NOTE — Progress Notes (Signed)
1 Day Post-Op  Subjective: Stable and alert. Ambulated to bathroom. Voiding without difficulty. Denies nausea. As tolerated a few sips of clear liquids. Daughter and rhythm.  Objective: Vital signs in last 24 hours: Temp:  [97.6 F (36.4 C)-99 F (37.2 C)] 98.3 F (36.8 C) (01/03 0501) Pulse Rate:  [68-104] 80 (01/03 0501) Resp:  [16-24] 16 (01/03 0501) BP: (95-153)/(38-60) 122/54 mmHg (01/03 0614) SpO2:  [89 %-100 %] 97 % (01/03 0501) Weight:  [136 lb 11 oz (62 kg)] 136 lb 11 oz (62 kg) (01/02 1833) Last BM Date: 08/21/13  Intake/Output from previous day: 01/02 0701 - 01/03 0700 In: 2118 [I.V.:2118] Out: 1020 [Urine:1000; Blood:20] Intake/Output this shift:     EXAM: General appearance: elderly but alert and oriented with normal mental status. No distress. Resp: clear to auscultation bilaterally GI: abdomen soft. Active bowel sounds. Borderline distended. Minimal, appropriate postop tenderness. Trocar sites look fine.    Lab Results:  Results for orders placed during the hospital encounter of 08/22/13 (from the past 24 hour(s))  OCCULT BLOOD, POC DEVICE     Status: None   Collection Time    08/22/13 10:48 AM      Result Value Range   Fecal Occult Bld NEGATIVE  NEGATIVE     Studies/Results: @RISRSLT24 @  . diltiazem  120 mg Oral Daily  . enoxaparin (LOVENOX) injection  40 mg Subcutaneous Q24H  . latanoprost  1 drop Both Eyes QHS  . levothyroxine  50 mcg Oral QAC breakfast  . lisinopril  20 mg Oral Daily  . metoprolol  50 mg Oral BID  . piperacillin-tazobactam (ZOSYN)  IV  3.375 g Intravenous Q8H  . sertraline  50 mg Oral Daily     Assessment/Plan: s/p Procedure(s): APPENDECTOMY LAPAROSCOPIC   POD #1. Laparoscopic appendectomy for nonperforated appendicitis Stable Advance diet and activities. Possibly home this afternoon or tomorrow morning.  DVT prophylaxis. On Lovenox.  Advanced age. Paroxysmal supraventricular  tachycardia Hypertension Hypothyroidism  @PROBHOSP @  LOS: 1 day    Michella Detjen M 08/23/2013  . .prob

## 2013-08-23 NOTE — Progress Notes (Signed)
Utilization Review completed.  

## 2013-08-24 LAB — URINE CULTURE

## 2013-08-24 MED ORDER — WHITE PETROLATUM GEL
Status: AC
  Administered 2013-08-24: 0.2
  Filled 2013-08-24: qty 5

## 2013-08-24 NOTE — Progress Notes (Signed)
Discharge instructions given to patient, and daughter. Pt discharged home by daughter.

## 2013-08-24 NOTE — Discharge Summary (Signed)
Patient ID: [name] [id number] 80 y.o. July 12, 1921  Admit date: 08/22/2013  Discharge date and time: 08/24/2013  Admitting Physician: Elenora Gamma  Discharge Physician: Adin Hector  Admission Diagnoses: Acute appendicitis [540.9]  Discharge Diagnoses: Acute appendicitis  Operations: Procedure(s): APPENDECTOMY LAPAROSCOPIC  Admission Condition: fair  Discharged Condition: good  Indication for Admission: This is a 80 white female who woke up around in the early  morning with mid abdominal pain. She denies nausea or vomiting. No fevers. Her pain persisted and migrated towards the RLQ. She presented to [facility] where she underwent a CT scan that revealed acute appendicitis. We were called to evaluate and treat.  Hospital Course: The patient was evaluated, given IV fluids and intravenous antibodies and prepared for the operating room. She was taken to the operating room and underwent a laparoscopic appendectomy.The appendix was swollen and edematous but was not perforated. Postoperatively the patient did fairly well. She progressed in her diet and activity slowly but without complication. She felt ready to go home on postoperative day 2. At that time she was alert, afebrile, and ambulatory. . Able to void and tolerate a regular diet. Her abdomen was soft. The wounds looked good. I discussed diet and activities with her and her daughter. She is taking Tylenol for pain and does not want any narcotic. This seems to be adequate. She will return to see Dr. [name] in the office in 2 weeks. She will otherwise continue her usual medications.  Consults: None  Significant Diagnostic Studies: Blood work. CT scan. Surgical pathology pending.  Treatments: surgery: Laparoscopic appendectomy  Disposition: Home  Patient Instructions:    Medication List         BIOTIN PO  Take 1 tablet by mouth daily.     butalbital-acetaminophen-caffeine 50-325-40 MG per tablet  Commonly known  as:  FIORICET, ESGIC  Take 1 tablet by mouth every 4 (four) hours as needed for headache.     CALCIUM 600+D 600-200 MG-UNIT Tabs  Generic drug:  Calcium Carbonate-Vitamin D  Take 1 tablet by mouth daily.     clopidogrel 75 MG tablet  Commonly known as:  PLAVIX  Take 37.5 mg by mouth every other day.     diazepam 5 MG tablet  Commonly known as:  VALIUM  Take 2.5 mg by mouth every 6 (six) hours as needed for anxiety.     diltiazem 120 MG 24 hr capsule  Commonly known as:  DILACOR XR  Take 120 mg by mouth daily.     fexofenadine 180 MG tablet  Commonly known as:  ALLEGRA  Take 180 mg by mouth daily.     Fish Oil 1000 MG Caps  Take 1 capsule by mouth 2 (two) times daily.     FLUOCINONIDE EX  Apply 2 drops topically daily.     gemfibrozil 600 MG tablet  Commonly known as:  LOPID  Take 600 mg by mouth 2 (two) times daily before a meal.     levothyroxine 50 MCG tablet  Commonly known as:  SYNTHROID, LEVOTHROID  Take 50 mcg by mouth daily before breakfast.     lisinopril 20 MG tablet  Commonly known as:  PRINIVIL,ZESTRIL  Take 20 mg by mouth daily.     meclizine 25 MG tablet  Commonly known as:  ANTIVERT  Take 25 mg by mouth 3 (three) times daily as needed for dizziness.     metoprolol 50 MG tablet  Commonly known as:  LOPRESSOR  Take 50 mg by mouth  2 (two) times daily.     nystatin-triamcinolone ointment  Commonly known as:  MYCOLOG  Apply 1 application topically 2 (two) times daily.     omeprazole 20 MG capsule  Commonly known as:  PRILOSEC  Take 20 mg by mouth daily.     sertraline 100 MG tablet  Commonly known as:  ZOLOFT  Take 50 mg by mouth daily.     XALATAN 0.005 % ophthalmic solution  Generic drug:  latanoprost  Place 1 drop into both eyes at bedtime.        Activity: activity as tolerated Diet: low fat, low cholesterol diet Wound Care: none needed  Follow-up:  With Dr. [name] in 2 weeks.  Signed: Edsel Petrin. Dalbert Batman, M.D., FACS General and  minimally invasive surgery Breast and Colorectal Surgery  08/24/2013, 9:35 AM

## 2013-08-24 NOTE — Discharge Instructions (Signed)
See above

## 2013-08-24 NOTE — Progress Notes (Signed)
Patient has ambulated to and from the bathroom with standby assist three times last night. Tolerated walk very well. Patient c/o soreness this morning and received Tylenol 650 mg PO at 0517.

## 2013-08-25 NOTE — Anesthesia Postprocedure Evaluation (Signed)
Anesthesia Post Note  Patient: [name]  Procedure(s) Performed: Procedure(s) (LRB): APPENDECTOMY LAPAROSCOPIC (N/A)  Anesthesia type: General  Patient location: PACU  Post pain: Pain level controlled and Adequate analgesia  Post assessment: Post-op Vital signs reviewed, Patient's Cardiovascular Status Stable, Respiratory Function Stable, Patent Airway and Pain level controlled  Last Vitals:  Filed Vitals:   08/24/13 0556  BP: 144/43  Pulse: 66  Temp: 36.9 C  Resp: 17    Post vital signs: Reviewed and stable  Level of consciousness: awake, alert  and oriented  Complications: No apparent anesthesia complications

## 2013-08-25 NOTE — ED Provider Notes (Signed)
Medical screening examination/treatment/procedure(s) were conducted as a shared visit with non-physician practitioner(s) and myself.  I personally evaluated the patient during the encounter.  EKG Interpretation    Date/Time:  Friday August 22 2013 07:55:21 EST Ventricular Rate:  68 PR Interval:  164 QRS Duration: 87 QT Interval:  397 QTC Calculation: 422 R Axis:   -5 Text Interpretation:  Sinus rhythm Low voltage, precordial leads No significant change since last tracing Confirmed by Calais Svehla  MD, Itali Mckendry ([id number]) on 08/22/2013 8:03:40 AM            Patient hemodynamically stable. No peritonitis, CT shows appy. Surgery to admit and take to [place], MD 08/25/13 [phone]

## 2013-08-27 ENCOUNTER — Encounter (HOSPITAL_COMMUNITY): Payer: Self-pay | Admitting: Surgery

## 2013-09-12 ENCOUNTER — Encounter (INDEPENDENT_AMBULATORY_CARE_PROVIDER_SITE_OTHER): Payer: Self-pay | Admitting: Surgery

## 2013-09-12 ENCOUNTER — Ambulatory Visit (INDEPENDENT_AMBULATORY_CARE_PROVIDER_SITE_OTHER): Payer: Medicare Other | Admitting: Surgery

## 2013-09-12 ENCOUNTER — Encounter (INDEPENDENT_AMBULATORY_CARE_PROVIDER_SITE_OTHER): Payer: Self-pay

## 2013-09-12 VITALS — BP 100/62 | HR 62 | Temp 97.2°F | Resp 14 | Ht 59.0 in | Wt 114.4 lb

## 2013-09-12 DIAGNOSIS — K3580 Unspecified acute appendicitis: Secondary | ICD-10-CM

## 2013-09-12 NOTE — Progress Notes (Signed)
Status post laparoscopic appendectomy on 08/22/13 for acute appendicitis.  She was discharged on 1/4. She is doing quite well. Her incisions are well-healed with no sign of infection. Appetite and bowel movements are good. She is slowly regaining her level of energy. She can return to her normal level of activity and followup as needed. Her pathology showed only acute appendicitis.  [name]. [name], MD, [facility]  General/ Trauma Surgery  09/12/2013 9:28 AM

## 2013-09-18 DIAGNOSIS — H353190 Nonexudative age-related macular degeneration, unspecified eye, stage unspecified: Secondary | ICD-10-CM | POA: Diagnosis not present

## 2013-09-18 DIAGNOSIS — H4011X0 Primary open-angle glaucoma, stage unspecified: Secondary | ICD-10-CM | POA: Diagnosis not present

## 2013-09-18 DIAGNOSIS — H409 Unspecified glaucoma: Secondary | ICD-10-CM | POA: Diagnosis not present

## 2013-09-18 DIAGNOSIS — Z961 Presence of intraocular lens: Secondary | ICD-10-CM | POA: Diagnosis not present

## 2013-11-17 DIAGNOSIS — N183 Chronic kidney disease, stage 3 unspecified: Secondary | ICD-10-CM | POA: Diagnosis not present

## 2013-11-17 DIAGNOSIS — E782 Mixed hyperlipidemia: Secondary | ICD-10-CM | POA: Diagnosis not present

## 2013-11-17 DIAGNOSIS — F329 Major depressive disorder, single episode, unspecified: Secondary | ICD-10-CM | POA: Diagnosis not present

## 2013-11-17 DIAGNOSIS — E039 Hypothyroidism, unspecified: Secondary | ICD-10-CM | POA: Diagnosis not present

## 2013-11-17 DIAGNOSIS — Z23 Encounter for immunization: Secondary | ICD-10-CM | POA: Diagnosis not present

## 2013-11-17 DIAGNOSIS — Z0000 Encounter for general adult medical examination without abnormal findings: Secondary | ICD-10-CM | POA: Diagnosis not present

## 2013-11-17 DIAGNOSIS — K219 Gastro-esophageal reflux disease without esophagitis: Secondary | ICD-10-CM | POA: Diagnosis not present

## 2013-11-17 DIAGNOSIS — I10 Essential (primary) hypertension: Secondary | ICD-10-CM | POA: Diagnosis not present

## 2013-11-17 DIAGNOSIS — D649 Anemia, unspecified: Secondary | ICD-10-CM | POA: Diagnosis not present

## 2013-11-17 DIAGNOSIS — I471 Supraventricular tachycardia: Secondary | ICD-10-CM | POA: Diagnosis not present

## 2013-12-05 DIAGNOSIS — L820 Inflamed seborrheic keratosis: Secondary | ICD-10-CM | POA: Diagnosis not present

## 2013-12-26 DIAGNOSIS — H409 Unspecified glaucoma: Secondary | ICD-10-CM | POA: Diagnosis not present

## 2013-12-26 DIAGNOSIS — H4011X0 Primary open-angle glaucoma, stage unspecified: Secondary | ICD-10-CM | POA: Diagnosis not present

## 2014-01-26 DIAGNOSIS — H409 Unspecified glaucoma: Secondary | ICD-10-CM | POA: Diagnosis not present

## 2014-01-26 DIAGNOSIS — H4011X0 Primary open-angle glaucoma, stage unspecified: Secondary | ICD-10-CM | POA: Diagnosis not present

## 2014-02-12 DIAGNOSIS — B372 Candidiasis of skin and nail: Secondary | ICD-10-CM | POA: Diagnosis not present

## 2014-02-12 DIAGNOSIS — K6289 Other specified diseases of anus and rectum: Secondary | ICD-10-CM | POA: Diagnosis not present

## 2014-02-12 DIAGNOSIS — R159 Full incontinence of feces: Secondary | ICD-10-CM | POA: Diagnosis not present

## 2014-02-12 DIAGNOSIS — L299 Pruritus, unspecified: Secondary | ICD-10-CM | POA: Diagnosis not present

## 2014-02-19 DIAGNOSIS — Z79899 Other long term (current) drug therapy: Secondary | ICD-10-CM | POA: Diagnosis not present

## 2014-02-19 DIAGNOSIS — K6289 Other specified diseases of anus and rectum: Secondary | ICD-10-CM | POA: Diagnosis not present

## 2014-02-19 DIAGNOSIS — B372 Candidiasis of skin and nail: Secondary | ICD-10-CM | POA: Diagnosis not present

## 2014-03-04 DIAGNOSIS — I10 Essential (primary) hypertension: Secondary | ICD-10-CM | POA: Diagnosis not present

## 2014-03-04 DIAGNOSIS — R42 Dizziness and giddiness: Secondary | ICD-10-CM | POA: Diagnosis not present

## 2014-03-20 DIAGNOSIS — H401490 Capsular glaucoma with pseudoexfoliation of lens, unspecified eye, stage unspecified: Secondary | ICD-10-CM | POA: Diagnosis not present

## 2014-03-20 DIAGNOSIS — H18599 Other hereditary corneal dystrophies, unspecified eye: Secondary | ICD-10-CM | POA: Diagnosis not present

## 2014-03-20 DIAGNOSIS — Z961 Presence of intraocular lens: Secondary | ICD-10-CM | POA: Diagnosis not present

## 2014-03-20 DIAGNOSIS — H409 Unspecified glaucoma: Secondary | ICD-10-CM | POA: Diagnosis not present

## 2014-04-02 DIAGNOSIS — Z7901 Long term (current) use of anticoagulants: Secondary | ICD-10-CM | POA: Diagnosis not present

## 2014-04-02 DIAGNOSIS — I498 Other specified cardiac arrhythmias: Secondary | ICD-10-CM | POA: Diagnosis not present

## 2014-04-10 DIAGNOSIS — H401490 Capsular glaucoma with pseudoexfoliation of lens, unspecified eye, stage unspecified: Secondary | ICD-10-CM | POA: Diagnosis not present

## 2014-04-10 DIAGNOSIS — H409 Unspecified glaucoma: Secondary | ICD-10-CM | POA: Diagnosis not present

## 2014-04-10 DIAGNOSIS — Z961 Presence of intraocular lens: Secondary | ICD-10-CM | POA: Insufficient documentation

## 2014-05-06 DIAGNOSIS — H401490 Capsular glaucoma with pseudoexfoliation of lens, unspecified eye, stage unspecified: Secondary | ICD-10-CM | POA: Insufficient documentation

## 2014-05-07 DIAGNOSIS — Z961 Presence of intraocular lens: Secondary | ICD-10-CM | POA: Diagnosis not present

## 2014-05-07 DIAGNOSIS — Z85828 Personal history of other malignant neoplasm of skin: Secondary | ICD-10-CM | POA: Diagnosis not present

## 2014-05-07 DIAGNOSIS — H409 Unspecified glaucoma: Secondary | ICD-10-CM | POA: Diagnosis not present

## 2014-05-07 DIAGNOSIS — Z9849 Cataract extraction status, unspecified eye: Secondary | ICD-10-CM | POA: Diagnosis not present

## 2014-05-07 DIAGNOSIS — I110 Hypertensive heart disease with heart failure: Secondary | ICD-10-CM | POA: Diagnosis not present

## 2014-05-07 DIAGNOSIS — R42 Dizziness and giddiness: Secondary | ICD-10-CM | POA: Diagnosis not present

## 2014-05-07 DIAGNOSIS — H401490 Capsular glaucoma with pseudoexfoliation of lens, unspecified eye, stage unspecified: Secondary | ICD-10-CM | POA: Diagnosis not present

## 2014-05-07 DIAGNOSIS — J309 Allergic rhinitis, unspecified: Secondary | ICD-10-CM | POA: Diagnosis not present

## 2014-05-07 DIAGNOSIS — K219 Gastro-esophageal reflux disease without esophagitis: Secondary | ICD-10-CM | POA: Diagnosis not present

## 2014-05-07 DIAGNOSIS — Z79899 Other long term (current) drug therapy: Secondary | ICD-10-CM | POA: Diagnosis not present

## 2014-05-07 DIAGNOSIS — Z90710 Acquired absence of both cervix and uterus: Secondary | ICD-10-CM | POA: Diagnosis not present

## 2014-05-07 DIAGNOSIS — I4891 Unspecified atrial fibrillation: Secondary | ICD-10-CM | POA: Diagnosis not present

## 2014-05-07 DIAGNOSIS — Z9089 Acquired absence of other organs: Secondary | ICD-10-CM | POA: Diagnosis not present

## 2014-05-07 DIAGNOSIS — Z7902 Long term (current) use of antithrombotics/antiplatelets: Secondary | ICD-10-CM | POA: Diagnosis not present

## 2014-05-07 DIAGNOSIS — E039 Hypothyroidism, unspecified: Secondary | ICD-10-CM | POA: Diagnosis not present

## 2014-05-07 DIAGNOSIS — I509 Heart failure, unspecified: Secondary | ICD-10-CM | POA: Diagnosis not present

## 2014-05-07 DIAGNOSIS — M129 Arthropathy, unspecified: Secondary | ICD-10-CM | POA: Diagnosis not present

## 2014-05-07 DIAGNOSIS — Z885 Allergy status to narcotic agent status: Secondary | ICD-10-CM | POA: Diagnosis not present

## 2014-05-13 DIAGNOSIS — H401490 Capsular glaucoma with pseudoexfoliation of lens, unspecified eye, stage unspecified: Secondary | ICD-10-CM | POA: Diagnosis not present

## 2014-05-13 DIAGNOSIS — H409 Unspecified glaucoma: Secondary | ICD-10-CM | POA: Diagnosis not present

## 2014-05-13 DIAGNOSIS — Z9889 Other specified postprocedural states: Secondary | ICD-10-CM | POA: Diagnosis not present

## 2014-05-20 DIAGNOSIS — Z9889 Other specified postprocedural states: Secondary | ICD-10-CM | POA: Diagnosis not present

## 2014-05-20 DIAGNOSIS — F329 Major depressive disorder, single episode, unspecified: Secondary | ICD-10-CM | POA: Diagnosis not present

## 2014-05-20 DIAGNOSIS — H401490 Capsular glaucoma with pseudoexfoliation of lens, unspecified eye, stage unspecified: Secondary | ICD-10-CM | POA: Diagnosis not present

## 2014-05-20 DIAGNOSIS — E039 Hypothyroidism, unspecified: Secondary | ICD-10-CM | POA: Diagnosis not present

## 2014-05-20 DIAGNOSIS — Z23 Encounter for immunization: Secondary | ICD-10-CM | POA: Diagnosis not present

## 2014-05-20 DIAGNOSIS — I10 Essential (primary) hypertension: Secondary | ICD-10-CM | POA: Diagnosis not present

## 2014-05-20 DIAGNOSIS — Z1331 Encounter for screening for depression: Secondary | ICD-10-CM | POA: Diagnosis not present

## 2014-05-20 DIAGNOSIS — N183 Chronic kidney disease, stage 3 unspecified: Secondary | ICD-10-CM | POA: Diagnosis not present

## 2014-05-20 DIAGNOSIS — H409 Unspecified glaucoma: Secondary | ICD-10-CM | POA: Diagnosis not present

## 2014-05-25 DIAGNOSIS — Z9889 Other specified postprocedural states: Secondary | ICD-10-CM | POA: Diagnosis not present

## 2014-05-25 DIAGNOSIS — H401434 Capsular glaucoma with pseudoexfoliation of lens, bilateral, indeterminate stage: Secondary | ICD-10-CM | POA: Diagnosis not present

## 2014-08-19 DIAGNOSIS — H04123 Dry eye syndrome of bilateral lacrimal glands: Secondary | ICD-10-CM | POA: Diagnosis not present

## 2014-08-19 DIAGNOSIS — Z961 Presence of intraocular lens: Secondary | ICD-10-CM | POA: Diagnosis not present

## 2014-08-19 DIAGNOSIS — H401434 Capsular glaucoma with pseudoexfoliation of lens, bilateral, indeterminate stage: Secondary | ICD-10-CM | POA: Diagnosis not present

## 2014-08-19 DIAGNOSIS — H401492 Capsular glaucoma with pseudoexfoliation of lens, unspecified eye, moderate stage: Secondary | ICD-10-CM | POA: Diagnosis not present

## 2014-09-03 DIAGNOSIS — H401421 Capsular glaucoma with pseudoexfoliation of lens, left eye, mild stage: Secondary | ICD-10-CM | POA: Diagnosis not present

## 2014-09-09 DIAGNOSIS — I10 Essential (primary) hypertension: Secondary | ICD-10-CM | POA: Diagnosis not present

## 2014-10-07 DIAGNOSIS — I10 Essential (primary) hypertension: Secondary | ICD-10-CM | POA: Diagnosis not present

## 2014-10-16 DIAGNOSIS — H401422 Capsular glaucoma with pseudoexfoliation of lens, left eye, moderate stage: Secondary | ICD-10-CM | POA: Diagnosis not present

## 2014-10-16 DIAGNOSIS — H401434 Capsular glaucoma with pseudoexfoliation of lens, bilateral, indeterminate stage: Secondary | ICD-10-CM | POA: Diagnosis not present

## 2014-10-16 DIAGNOSIS — H04123 Dry eye syndrome of bilateral lacrimal glands: Secondary | ICD-10-CM | POA: Diagnosis not present

## 2014-11-23 DIAGNOSIS — F324 Major depressive disorder, single episode, in partial remission: Secondary | ICD-10-CM | POA: Diagnosis not present

## 2014-11-23 DIAGNOSIS — H8110 Benign paroxysmal vertigo, unspecified ear: Secondary | ICD-10-CM | POA: Diagnosis not present

## 2014-11-23 DIAGNOSIS — Z0000 Encounter for general adult medical examination without abnormal findings: Secondary | ICD-10-CM | POA: Diagnosis not present

## 2014-11-23 DIAGNOSIS — I10 Essential (primary) hypertension: Secondary | ICD-10-CM | POA: Diagnosis not present

## 2014-11-23 DIAGNOSIS — I471 Supraventricular tachycardia: Secondary | ICD-10-CM | POA: Diagnosis not present

## 2014-11-23 DIAGNOSIS — E039 Hypothyroidism, unspecified: Secondary | ICD-10-CM | POA: Diagnosis not present

## 2014-11-23 DIAGNOSIS — N183 Chronic kidney disease, stage 3 (moderate): Secondary | ICD-10-CM | POA: Diagnosis not present

## 2014-11-23 DIAGNOSIS — Z1389 Encounter for screening for other disorder: Secondary | ICD-10-CM | POA: Diagnosis not present

## 2014-11-23 DIAGNOSIS — E782 Mixed hyperlipidemia: Secondary | ICD-10-CM | POA: Diagnosis not present

## 2014-11-23 DIAGNOSIS — D649 Anemia, unspecified: Secondary | ICD-10-CM | POA: Diagnosis not present

## 2014-12-14 DIAGNOSIS — M8589 Other specified disorders of bone density and structure, multiple sites: Secondary | ICD-10-CM | POA: Diagnosis not present

## 2014-12-22 DIAGNOSIS — E039 Hypothyroidism, unspecified: Secondary | ICD-10-CM | POA: Diagnosis not present

## 2015-01-04 DIAGNOSIS — M8588 Other specified disorders of bone density and structure, other site: Secondary | ICD-10-CM | POA: Diagnosis not present

## 2015-01-04 DIAGNOSIS — M1990 Unspecified osteoarthritis, unspecified site: Secondary | ICD-10-CM | POA: Diagnosis not present

## 2015-01-04 DIAGNOSIS — F324 Major depressive disorder, single episode, in partial remission: Secondary | ICD-10-CM | POA: Diagnosis not present

## 2015-01-19 DIAGNOSIS — M5416 Radiculopathy, lumbar region: Secondary | ICD-10-CM | POA: Diagnosis not present

## 2015-01-22 DIAGNOSIS — H401421 Capsular glaucoma with pseudoexfoliation of lens, left eye, mild stage: Secondary | ICD-10-CM | POA: Diagnosis not present

## 2015-01-22 DIAGNOSIS — H401434 Capsular glaucoma with pseudoexfoliation of lens, bilateral, indeterminate stage: Secondary | ICD-10-CM | POA: Diagnosis not present

## 2015-01-22 DIAGNOSIS — H04123 Dry eye syndrome of bilateral lacrimal glands: Secondary | ICD-10-CM | POA: Diagnosis not present

## 2015-01-22 DIAGNOSIS — H401422 Capsular glaucoma with pseudoexfoliation of lens, left eye, moderate stage: Secondary | ICD-10-CM | POA: Diagnosis not present

## 2015-01-25 ENCOUNTER — Other Ambulatory Visit: Payer: Self-pay | Admitting: Internal Medicine

## 2015-01-25 DIAGNOSIS — M5416 Radiculopathy, lumbar region: Secondary | ICD-10-CM

## 2015-01-26 ENCOUNTER — Other Ambulatory Visit: Payer: Medicare Other

## 2015-01-27 ENCOUNTER — Ambulatory Visit
Admission: RE | Admit: 2015-01-27 | Discharge: 2015-01-27 | Disposition: A | Discharge status: Home / Self Care | Payer: Medicare Other | Source: Ambulatory Visit | Attending: Internal Medicine | Admitting: Internal Medicine

## 2015-01-27 DIAGNOSIS — M5417 Radiculopathy, lumbosacral region: Secondary | ICD-10-CM | POA: Diagnosis not present

## 2015-01-27 DIAGNOSIS — M47819 Spondylosis without myelopathy or radiculopathy, site unspecified: Secondary | ICD-10-CM | POA: Diagnosis not present

## 2015-01-27 DIAGNOSIS — M5416 Radiculopathy, lumbar region: Secondary | ICD-10-CM

## 2015-01-27 MED ORDER — METHYLPREDNISOLONE ACETATE 40 MG/ML INJ SUSP (RADIOLOG
120.0000 mg | Freq: Once | INTRAMUSCULAR | Status: AC
  Administered 2015-01-27: 120 mg via EPIDURAL

## 2015-01-27 MED ORDER — IOHEXOL 180 MG/ML  SOLN
1.0000 mL | Freq: Once | INTRAMUSCULAR | Status: AC | PRN
  Administered 2015-01-27: 1 mL via EPIDURAL

## 2015-01-27 NOTE — Discharge Instructions (Signed)

## 2015-03-10 DIAGNOSIS — I10 Essential (primary) hypertension: Secondary | ICD-10-CM | POA: Diagnosis not present

## 2015-03-10 DIAGNOSIS — R5383 Other fatigue: Secondary | ICD-10-CM | POA: Diagnosis not present

## 2015-03-24 DIAGNOSIS — M519 Unspecified thoracic, thoracolumbar and lumbosacral intervertebral disc disorder: Secondary | ICD-10-CM | POA: Diagnosis not present

## 2015-03-24 DIAGNOSIS — G450 Vertebro-basilar artery syndrome: Secondary | ICD-10-CM | POA: Diagnosis not present

## 2015-03-24 DIAGNOSIS — R269 Unspecified abnormalities of gait and mobility: Secondary | ICD-10-CM | POA: Diagnosis not present

## 2015-03-24 DIAGNOSIS — M549 Dorsalgia, unspecified: Secondary | ICD-10-CM | POA: Diagnosis not present

## 2015-03-26 ENCOUNTER — Ambulatory Visit
Admission: RE | Admit: 2015-03-26 | Discharge: 2015-03-26 | Disposition: A | Discharge status: Home / Self Care | Payer: Medicare Other | Source: Ambulatory Visit | Attending: Internal Medicine | Admitting: Internal Medicine

## 2015-03-26 ENCOUNTER — Other Ambulatory Visit: Payer: Self-pay | Admitting: Internal Medicine

## 2015-03-26 DIAGNOSIS — M47817 Spondylosis without myelopathy or radiculopathy, lumbosacral region: Secondary | ICD-10-CM | POA: Diagnosis not present

## 2015-03-26 DIAGNOSIS — H8123 Vestibular neuronitis, bilateral: Secondary | ICD-10-CM | POA: Diagnosis not present

## 2015-03-26 DIAGNOSIS — M5489 Other dorsalgia: Secondary | ICD-10-CM

## 2015-03-26 DIAGNOSIS — I129 Hypertensive chronic kidney disease with stage 1 through stage 4 chronic kidney disease, or unspecified chronic kidney disease: Secondary | ICD-10-CM | POA: Diagnosis not present

## 2015-03-26 DIAGNOSIS — M519 Unspecified thoracic, thoracolumbar and lumbosacral intervertebral disc disorder: Secondary | ICD-10-CM | POA: Diagnosis not present

## 2015-03-26 DIAGNOSIS — M4726 Other spondylosis with radiculopathy, lumbar region: Secondary | ICD-10-CM | POA: Diagnosis not present

## 2015-03-26 DIAGNOSIS — M545 Low back pain: Secondary | ICD-10-CM | POA: Diagnosis not present

## 2015-03-26 DIAGNOSIS — M5136 Other intervertebral disc degeneration, lumbar region: Secondary | ICD-10-CM | POA: Diagnosis not present

## 2015-03-29 DIAGNOSIS — M4726 Other spondylosis with radiculopathy, lumbar region: Secondary | ICD-10-CM | POA: Diagnosis not present

## 2015-03-29 DIAGNOSIS — M519 Unspecified thoracic, thoracolumbar and lumbosacral intervertebral disc disorder: Secondary | ICD-10-CM | POA: Diagnosis not present

## 2015-03-30 DIAGNOSIS — M4726 Other spondylosis with radiculopathy, lumbar region: Secondary | ICD-10-CM | POA: Diagnosis not present

## 2015-03-30 DIAGNOSIS — M519 Unspecified thoracic, thoracolumbar and lumbosacral intervertebral disc disorder: Secondary | ICD-10-CM | POA: Diagnosis not present

## 2015-04-01 DIAGNOSIS — M519 Unspecified thoracic, thoracolumbar and lumbosacral intervertebral disc disorder: Secondary | ICD-10-CM | POA: Diagnosis not present

## 2015-04-01 DIAGNOSIS — M4726 Other spondylosis with radiculopathy, lumbar region: Secondary | ICD-10-CM | POA: Diagnosis not present

## 2015-04-05 DIAGNOSIS — M519 Unspecified thoracic, thoracolumbar and lumbosacral intervertebral disc disorder: Secondary | ICD-10-CM | POA: Diagnosis not present

## 2015-04-05 DIAGNOSIS — M4726 Other spondylosis with radiculopathy, lumbar region: Secondary | ICD-10-CM | POA: Diagnosis not present

## 2015-04-07 DIAGNOSIS — M519 Unspecified thoracic, thoracolumbar and lumbosacral intervertebral disc disorder: Secondary | ICD-10-CM | POA: Diagnosis not present

## 2015-04-07 DIAGNOSIS — M4726 Other spondylosis with radiculopathy, lumbar region: Secondary | ICD-10-CM | POA: Diagnosis not present

## 2015-04-08 DIAGNOSIS — M519 Unspecified thoracic, thoracolumbar and lumbosacral intervertebral disc disorder: Secondary | ICD-10-CM | POA: Diagnosis not present

## 2015-04-08 DIAGNOSIS — M4726 Other spondylosis with radiculopathy, lumbar region: Secondary | ICD-10-CM | POA: Diagnosis not present

## 2015-04-09 DIAGNOSIS — M4726 Other spondylosis with radiculopathy, lumbar region: Secondary | ICD-10-CM | POA: Diagnosis not present

## 2015-04-09 DIAGNOSIS — M519 Unspecified thoracic, thoracolumbar and lumbosacral intervertebral disc disorder: Secondary | ICD-10-CM | POA: Diagnosis not present

## 2015-04-12 DIAGNOSIS — M4726 Other spondylosis with radiculopathy, lumbar region: Secondary | ICD-10-CM | POA: Diagnosis not present

## 2015-04-12 DIAGNOSIS — M519 Unspecified thoracic, thoracolumbar and lumbosacral intervertebral disc disorder: Secondary | ICD-10-CM | POA: Diagnosis not present

## 2015-04-15 DIAGNOSIS — M519 Unspecified thoracic, thoracolumbar and lumbosacral intervertebral disc disorder: Secondary | ICD-10-CM | POA: Diagnosis not present

## 2015-04-15 DIAGNOSIS — M4726 Other spondylosis with radiculopathy, lumbar region: Secondary | ICD-10-CM | POA: Diagnosis not present

## 2015-04-20 DIAGNOSIS — M4726 Other spondylosis with radiculopathy, lumbar region: Secondary | ICD-10-CM | POA: Diagnosis not present

## 2015-04-20 DIAGNOSIS — M519 Unspecified thoracic, thoracolumbar and lumbosacral intervertebral disc disorder: Secondary | ICD-10-CM | POA: Diagnosis not present

## 2015-04-21 DIAGNOSIS — H401434 Capsular glaucoma with pseudoexfoliation of lens, bilateral, indeterminate stage: Secondary | ICD-10-CM | POA: Diagnosis not present

## 2015-04-21 DIAGNOSIS — H04123 Dry eye syndrome of bilateral lacrimal glands: Secondary | ICD-10-CM | POA: Diagnosis not present

## 2015-04-21 DIAGNOSIS — H04129 Dry eye syndrome of unspecified lacrimal gland: Secondary | ICD-10-CM | POA: Insufficient documentation

## 2015-04-21 DIAGNOSIS — Z961 Presence of intraocular lens: Secondary | ICD-10-CM | POA: Diagnosis not present

## 2015-04-22 DIAGNOSIS — M519 Unspecified thoracic, thoracolumbar and lumbosacral intervertebral disc disorder: Secondary | ICD-10-CM | POA: Diagnosis not present

## 2015-04-22 DIAGNOSIS — M4726 Other spondylosis with radiculopathy, lumbar region: Secondary | ICD-10-CM | POA: Diagnosis not present

## 2015-04-27 DIAGNOSIS — M519 Unspecified thoracic, thoracolumbar and lumbosacral intervertebral disc disorder: Secondary | ICD-10-CM | POA: Diagnosis not present

## 2015-04-27 DIAGNOSIS — M4726 Other spondylosis with radiculopathy, lumbar region: Secondary | ICD-10-CM | POA: Diagnosis not present

## 2015-04-29 DIAGNOSIS — M4726 Other spondylosis with radiculopathy, lumbar region: Secondary | ICD-10-CM | POA: Diagnosis not present

## 2015-04-29 DIAGNOSIS — M519 Unspecified thoracic, thoracolumbar and lumbosacral intervertebral disc disorder: Secondary | ICD-10-CM | POA: Diagnosis not present

## 2015-05-05 ENCOUNTER — Emergency Department (HOSPITAL_COMMUNITY)
Admission: EM | Admit: 2015-05-05 | Discharge: 2015-05-05 | Disposition: A | Discharge status: Home / Self Care | Payer: Medicare Other | Attending: Emergency Medicine | Admitting: Emergency Medicine

## 2015-05-05 ENCOUNTER — Encounter (HOSPITAL_COMMUNITY): Payer: Self-pay | Admitting: *Deleted

## 2015-05-05 ENCOUNTER — Emergency Department (HOSPITAL_COMMUNITY): Payer: Medicare Other

## 2015-05-05 DIAGNOSIS — W01198A Fall on same level from slipping, tripping and stumbling with subsequent striking against other object, initial encounter: Secondary | ICD-10-CM | POA: Insufficient documentation

## 2015-05-05 DIAGNOSIS — Y9301 Activity, walking, marching and hiking: Secondary | ICD-10-CM | POA: Diagnosis not present

## 2015-05-05 DIAGNOSIS — S0191XA Laceration without foreign body of unspecified part of head, initial encounter: Secondary | ICD-10-CM | POA: Diagnosis not present

## 2015-05-05 DIAGNOSIS — K219 Gastro-esophageal reflux disease without esophagitis: Secondary | ICD-10-CM | POA: Insufficient documentation

## 2015-05-05 DIAGNOSIS — E039 Hypothyroidism, unspecified: Secondary | ICD-10-CM | POA: Insufficient documentation

## 2015-05-05 DIAGNOSIS — S0990XA Unspecified injury of head, initial encounter: Secondary | ICD-10-CM

## 2015-05-05 DIAGNOSIS — Y998 Other external cause status: Secondary | ICD-10-CM | POA: Insufficient documentation

## 2015-05-05 DIAGNOSIS — I471 Supraventricular tachycardia: Secondary | ICD-10-CM | POA: Insufficient documentation

## 2015-05-05 DIAGNOSIS — Z7902 Long term (current) use of antithrombotics/antiplatelets: Secondary | ICD-10-CM | POA: Insufficient documentation

## 2015-05-05 DIAGNOSIS — M810 Age-related osteoporosis without current pathological fracture: Secondary | ICD-10-CM | POA: Insufficient documentation

## 2015-05-05 DIAGNOSIS — R42 Dizziness and giddiness: Secondary | ICD-10-CM | POA: Diagnosis not present

## 2015-05-05 DIAGNOSIS — Z7952 Long term (current) use of systemic steroids: Secondary | ICD-10-CM | POA: Insufficient documentation

## 2015-05-05 DIAGNOSIS — G43909 Migraine, unspecified, not intractable, without status migrainosus: Secondary | ICD-10-CM | POA: Insufficient documentation

## 2015-05-05 DIAGNOSIS — S0003XA Contusion of scalp, initial encounter: Secondary | ICD-10-CM | POA: Insufficient documentation

## 2015-05-05 DIAGNOSIS — Y92009 Unspecified place in unspecified non-institutional (private) residence as the place of occurrence of the external cause: Secondary | ICD-10-CM | POA: Insufficient documentation

## 2015-05-05 DIAGNOSIS — Z85828 Personal history of other malignant neoplasm of skin: Secondary | ICD-10-CM | POA: Insufficient documentation

## 2015-05-05 DIAGNOSIS — Z79899 Other long term (current) drug therapy: Secondary | ICD-10-CM | POA: Insufficient documentation

## 2015-05-05 DIAGNOSIS — E782 Mixed hyperlipidemia: Secondary | ICD-10-CM | POA: Diagnosis not present

## 2015-05-05 DIAGNOSIS — Z862 Personal history of diseases of the blood and blood-forming organs and certain disorders involving the immune mechanism: Secondary | ICD-10-CM | POA: Diagnosis not present

## 2015-05-05 DIAGNOSIS — I10 Essential (primary) hypertension: Secondary | ICD-10-CM | POA: Insufficient documentation

## 2015-05-05 DIAGNOSIS — S098XXA Other specified injuries of head, initial encounter: Secondary | ICD-10-CM | POA: Diagnosis not present

## 2015-05-05 DIAGNOSIS — M25552 Pain in left hip: Secondary | ICD-10-CM | POA: Diagnosis not present

## 2015-05-05 DIAGNOSIS — I209 Angina pectoris, unspecified: Secondary | ICD-10-CM | POA: Diagnosis not present

## 2015-05-05 DIAGNOSIS — G8929 Other chronic pain: Secondary | ICD-10-CM | POA: Insufficient documentation

## 2015-05-05 DIAGNOSIS — S0001XA Abrasion of scalp, initial encounter: Secondary | ICD-10-CM | POA: Diagnosis not present

## 2015-05-05 DIAGNOSIS — S199XXA Unspecified injury of neck, initial encounter: Secondary | ICD-10-CM | POA: Insufficient documentation

## 2015-05-05 DIAGNOSIS — W19XXXA Unspecified fall, initial encounter: Secondary | ICD-10-CM

## 2015-05-05 MED ORDER — LISINOPRIL 20 MG PO TABS
20.0000 mg | ORAL_TABLET | Freq: Once | ORAL | Status: AC
  Administered 2015-05-05: 20 mg via ORAL
  Filled 2015-05-05: qty 1

## 2015-05-05 MED ORDER — MECLIZINE HCL 25 MG PO TABS
12.5000 mg | ORAL_TABLET | Freq: Once | ORAL | Status: AC
  Administered 2015-05-05: 12.5 mg via ORAL
  Filled 2015-05-05: qty 1

## 2015-05-05 MED ORDER — METOPROLOL TARTRATE 25 MG PO TABS
50.0000 mg | ORAL_TABLET | Freq: Once | ORAL | Status: AC
  Administered 2015-05-05: 50 mg via ORAL
  Filled 2015-05-05: qty 2

## 2015-05-05 NOTE — ED Notes (Addendum)
Pt arrived via [facility] from home alone after a fall.  Pt fell backwards and hit head on the floor, pt takes Plavix, small lac to back of head, bleeding controlled, C-collar on.  EMS reports pt has balance issues and is actually getting PT to help.  Pt denies pain or LOC, a x 4.  Pt also c /o dizziness with change in position, reports inner ear problems and takes meclizine for it.  BP-190/70 P-60, pt has not taken any of her daily medications today. Pt denies neck/back pain.

## 2015-05-05 NOTE — Discharge Instructions (Signed)
Concussion A concussion is a brain injury. It is caused by:  A hit to the head.  A quick and sudden movement (jolt) of the head or neck. A concussion is usually not life threatening. Even so, it can cause serious problems. If you had a concussion before, you may have concussion-like problems after a hit to your head. HOME CARE General Instructions  Follow your doctor's directions carefully.  Take medicines only as told by your doctor.  Only take medicines your doctor says are safe.  Do not drink alcohol until your doctor says it is okay. Alcohol and some drugs can slow down healing. They can also put you at risk for further injury.  If you are having trouble remembering things, write them down.  Try to do one thing at a time if you get distracted easily. For example, do not watch TV while making dinner.  Talk to your family members or close friends when making important decisions.  Follow up with your doctor as told.  Watch your symptoms. Tell others to do the same. Serious problems can sometimes happen after a concussion. Older adults are more likely to have these problems.  Tell your teachers, school nurse, school counselor, coach, Product/process development scientist, or work Freight forwarder about your concussion. Tell them about what you can or cannot do. They should watch to see if:  It gets even harder for you to pay attention or concentrate.  It gets even harder for you to remember things or learn new things.  You need more time than normal to finish things.  You become annoyed (irritable) more than before.  You are not able to deal with stress as well.  You have more problems than before.  Rest. Make sure you:  Get plenty of sleep at night.  Go to sleep early.  Go to bed at the same time every day. Try to wake up at the same time.  Rest during the day.  Take naps when you feel tired.  Limit activities where you have to think a lot or concentrate. These include:  Doing  homework.  Doing work related to a job.  Watching TV.  Using the computer. Returning To Your Regular Activities Return to your normal activities slowly, not all at once. You must give your body and brain enough time to heal.   Do not play sports or do other athletic activities until your doctor says it is okay.  Ask your doctor when you can drive, ride a bicycle, or work other vehicles or machines. Never do these things if you feel dizzy.  Ask your doctor about when you can return to work or school. Preventing Another Concussion It is very important to avoid another brain injury, especially before you have healed. In rare cases, another injury can lead to permanent brain damage, brain swelling, or death. The risk of this is greatest during the first 7-10 days after your injury. Avoid injuries by:   Wearing a seat belt when riding in a car.  Not drinking too much alcohol.  Avoiding activities that could lead to a second concussion (such as contact sports).  Wearing a helmet when doing activities like:  Biking.  Skiing.  Skateboarding.  Skating.  Making your home safer by:  Removing things from the floor or stairways that could make you trip.  Using grab bars in bathrooms and handrails by stairs.  Placing non-slip mats on floors and in bathtubs.  Improve lighting in dark areas. GET HELP IF:  It  gets even harder for you to pay attention or concentrate.  It gets even harder for you to remember things or learn new things.  You need more time than normal to finish things.  You become annoyed (irritable) more than before.  You are not able to deal with stress as well.  You have more problems than before.  You have problems keeping your balance.  You are not able to react quickly when you should. Get help if you have any of these problems for more than 2 weeks:   Lasting (chronic) headaches.  Dizziness or trouble balancing.  Feeling sick to your stomach  (nausea).  Seeing (vision) problems.  Being affected by noises or light more than normal.  Feeling sad, low, down in the dumps, blue, gloomy, or empty (depressed).  Mood changes (mood swings).  Feeling of fear or nervousness about what may happen (anxiety).  Feeling annoyed.  Memory problems.  Problems concentrating or paying attention.  Sleep problems.  Feeling tired all the time. GET HELP RIGHT AWAY IF:   You have bad headaches or your headaches get worse.  You have weakness (even if it is in one hand, leg, or part of the face).  You have loss of feeling (numbness).  You feel off balance.  You keep throwing up (vomiting).  You feel tired.  One black center of your eye (pupil) is larger than the other.  You twitch or shake violently (convulse).  Your speech is not clear (slurred).  You are more confused, easily angered (agitated), or annoyed than before.  You have more trouble resting than before.  You are unable to recognize people or places.  You have neck pain.  It is difficult to wake you up.  You have unusual behavior changes.  You pass out (lose consciousness). MAKE SURE YOU:   Understand these instructions.  Will watch your condition.  Will get help right away if you are not doing well or get worse. Document Released: 07/26/2009 Document Revised: 12/22/2013 Document Reviewed: 02/27/2013 [facility] Patient Information 2015 [place], [place]. This information is not intended to replace advice given to you by your health care provider. Make sure you discuss any questions you have with your health care provider.

## 2015-05-05 NOTE — ED Notes (Signed)
Patient transported to X-ray 

## 2015-05-05 NOTE — ED Provider Notes (Signed)
CSN: [id number]     Arrival date & time 05/05/15  1210 History   First MD Initiated Contact with Patient 05/05/15 1210     Chief Complaint  Patient presents with  . Fall     (Consider location/radiation/quality/duration/timing/severity/associated sxs/prior Treatment) HPI Comments: 81 year old female with past medical history including hypertension, hypothyroidism, osteoporosis, vertigo who presents with fall. Just prior to arrival, the patient was walking down her hallway at her house when she fell backwards and struck the back of her head. She complains of pain on the back of her head as well as muscle soreness along the sides of her neck. She also complains of mild L hip pain. No chest pain, difficulty breathing, or abdominal pain. The patient states that she has problems with her balance and she did not syncopize but just was unsteady and fell. She denies any loss of consciousness. Family members note that she has had physical therapy previously for her balance problems but they stopped it due to insurance restrictions. Patient also complains of dizziness which is worse when she sits forward. She has had this problem chronically and takes meclizine for it. Patient has not taken any of her other medications today.  Patient is a 81 y.o. female presenting with fall. The history is provided by the patient.  Fall    Past Medical History  Diagnosis Date  . Hypertension   . Hypothyroidism   . Anemia   . Osteoporosis   . Mixed hyperlipidemia   . Chronic lower back pain   . Anginal pain   . Paroxysmal supraventricular tachycardia   . Dysrhythmia     "irregular heart beat"   . Exertional shortness of breath   . GERD (gastroesophageal reflux disease)   . Migraines     "used to"  . Arthritis     "all over; doesn't seem to bother me much" (08/22/2013)  . Skin cancer     "couple taken off right leg" (08/22/2013)   Past Surgical History  Procedure Laterality Date  . Throat surgery  1940's; ?     "cysts removed"   . Appendectomy  08/22/2013  . Lumbar disc surgery  2008  . Abdominal hysterectomy    . Cataract extraction w/ intraocular lens  implant, bilateral    . Back surgery    . Laparoscopic appendectomy N/A 08/22/2013    Procedure: APPENDECTOMY LAPAROSCOPIC;  Surgeon: Imogene Burn. [name], MD;  Location: [facility];  Service: General;  Laterality: N/A;   No family history on file. Social History  Substance Use Topics  . Smoking status: Never Smoker   . Smokeless tobacco: Never Used  . Alcohol Use: No   OB History    No data available     Review of Systems 10 Systems reviewed and are negative for acute change except as noted in the HPI.    Allergies  Codeine  Home Medications   Prior to Admission medications   Medication Sig Start Date End Date Taking? Authorizing Provider  bimatoprost (LUMIGAN) 0.01 % SOLN Place 1 drop into the left eye at bedtime. 05/07/14  Yes Historical Provider, MD  BIOTIN PO Take 1 tablet by mouth daily.   Yes Historical Provider, MD  brimonidine (ALPHAGAN P) 0.1 % SOLN Place 1 drop into the left eye 3 (three) times daily. 05/07/14  Yes Historical Provider, MD  Calcium Carbonate-Vitamin D (CALCIUM 600+D) 600-200 MG-UNIT TABS Take 1 tablet by mouth daily.   Yes Historical Provider, MD  clopidogrel (PLAVIX) 75 MG tablet  Take 37.5 mg by mouth every other day.   Yes Historical Provider, MD  diazepam (VALIUM) 5 MG tablet Take 2.5 mg by mouth every 6 (six) hours as needed for anxiety.   Yes Historical Provider, MD  diltiazem (DILACOR XR) 120 MG 24 hr capsule Take 120 mg by mouth daily.   Yes Historical Provider, MD  dorzolamide-timolol (COSOPT) 22.3-6.8 MG/ML ophthalmic solution Place 1 drop into the left eye 2 (two) times daily. 05/07/14  Yes Historical Provider, MD  fexofenadine (ALLEGRA) 180 MG tablet Take 180 mg by mouth daily.   Yes Historical Provider, MD  gemfibrozil (LOPID) 600 MG tablet Take 600 mg by mouth 2 (two) times daily before a meal.   Yes  Historical Provider, MD  latanoprost (XALATAN) 0.005 % ophthalmic solution Place 1 drop into both eyes at bedtime.   Yes Historical Provider, MD  levothyroxine (SYNTHROID, LEVOTHROID) 50 MCG tablet Take 50 mcg by mouth 2 (two) times daily.    Yes Historical Provider, MD  lisinopril (PRINIVIL,ZESTRIL) 20 MG tablet Take 20 mg by mouth daily.   Yes Historical Provider, MD  meclizine (ANTIVERT) 25 MG tablet Take 25 mg by mouth 3 (three) times daily as needed for dizziness.   Yes Historical Provider, MD  metoprolol (LOPRESSOR) 50 MG tablet Take 50 mg by mouth 2 (two) times daily.   Yes Historical Provider, MD  Omega-3 Fatty Acids (FISH OIL) 1000 MG CAPS Take 1 capsule by mouth 2 (two) times daily.   Yes Historical Provider, MD  omeprazole (PRILOSEC) 20 MG capsule Take 20 mg by mouth daily.   Yes Historical Provider, MD  sertraline (ZOLOFT) 100 MG tablet Take 100 mg by mouth at bedtime.    Yes Historical Provider, MD  butalbital-acetaminophen-caffeine (FIORICET, ESGIC) 50-325-40 MG per tablet Take 1 tablet by mouth every 4 (four) hours as needed for headache.    Historical Provider, MD  CARTIA XT 120 MG 24 hr capsule  07/29/13   Historical Provider, MD  FLUOCINONIDE EX Apply 2 drops topically daily.    Historical Provider, MD  nystatin-triamcinolone ointment (MYCOLOG) Apply 1 application topically 2 (two) times daily.    Historical Provider, MD   BP 182/56 mmHg  Pulse 57  Temp(Src) 98.4 F (36.9 C) (Oral)  Resp 13  SpO2 98% Physical Exam  Constitutional: She is oriented to person, place, and time. No distress.  Awake, alert, thin, frail-appearing woman  HENT:  Head: Normocephalic.  Nose: Nose normal.  Mouth/Throat: Oropharynx is clear and moist.  Abrasion with hematoma on posterior scalp  Eyes: Conjunctivae and EOM are normal. Pupils are equal, round, and reactive to light.  Neck: Neck supple.  Tenderness to palpation of bilateral paracervical spinal muscles  Cardiovascular: Normal rate,  regular rhythm, normal heart sounds and intact distal pulses.   No murmur heard. Pulmonary/Chest: Effort normal and breath sounds normal. No respiratory distress.  Abdominal: Soft. Bowel sounds are normal. She exhibits no distension.  Musculoskeletal: She exhibits no edema.  Neurological: She is alert and oriented to person, place, and time. She has normal reflexes. No cranial nerve deficit. She exhibits normal muscle tone.  Fluent speech, no nystagmus  Skin: Skin is warm and dry.  Psychiatric: She has a normal mood and affect. Judgment and thought content normal.  Nursing note and vitals reviewed.   ED Course  Procedures (including critical care time) Labs Review Labs Reviewed - No data to display  Imaging Review Ct Head Wo Contrast  05/05/2015   CLINICAL DATA:  Head  laceration after fall at home. No loss of consciousness.  EXAM: CT HEAD WITHOUT CONTRAST  CT CERVICAL SPINE WITHOUT CONTRAST  TECHNIQUE: Multidetector CT imaging of the head and cervical spine was performed following the standard protocol without intravenous contrast. Multiplanar CT image reconstructions of the cervical spine were also generated.  COMPARISON:  None.  FINDINGS: CT HEAD FINDINGS  Bony calvarium appears intact. Mild diffuse cortical atrophy is noted. Mild chronic ischemic white matter disease is noted. Old lacunar infarction is noted in right basal ganglia. No mass effect or midline shift is noted. Ventricular size is within normal limits. There is no evidence of mass lesion, hemorrhage or acute infarction.  CT CERVICAL SPINE FINDINGS  No fracture is noted. Minimal grade 1 retrolisthesis is noted at C4-5. Severe degenerative disc disease is noted at C3-4 with anterior osteophyte formation. Moderate degenerative disc disease is noted at C5-6 with posterior osteophyte formation. Posterior facet joints appear intact. Visualized lung apices appear normal.  IMPRESSION: Mild diffuse cortical atrophy. Mild chronic ischemic  white matter disease. No acute intracranial abnormality seen.   Electronically Signed   By: Marijo Conception, M.D.   On: 05/05/2015 13:34   Ct Cervical Spine Wo Contrast  05/05/2015   CLINICAL DATA:  Head laceration after fall at home. No loss of consciousness.  EXAM: CT HEAD WITHOUT CONTRAST  CT CERVICAL SPINE WITHOUT CONTRAST  TECHNIQUE: Multidetector CT imaging of the head and cervical spine was performed following the standard protocol without intravenous contrast. Multiplanar CT image reconstructions of the cervical spine were also generated.  COMPARISON:  None.  FINDINGS: CT HEAD FINDINGS  Bony calvarium appears intact. Mild diffuse cortical atrophy is noted. Mild chronic ischemic white matter disease is noted. Old lacunar infarction is noted in right basal ganglia. No mass effect or midline shift is noted. Ventricular size is within normal limits. There is no evidence of mass lesion, hemorrhage or acute infarction.  CT CERVICAL SPINE FINDINGS  No fracture is noted. Minimal grade 1 retrolisthesis is noted at C4-5. Severe degenerative disc disease is noted at C3-4 with anterior osteophyte formation. Moderate degenerative disc disease is noted at C5-6 with posterior osteophyte formation. Posterior facet joints appear intact. Visualized lung apices appear normal.  IMPRESSION: Mild diffuse cortical atrophy. Mild chronic ischemic white matter disease. No acute intracranial abnormality seen.   Electronically Signed   By: Marijo Conception, M.D.   On: 05/05/2015 13:34   Dg Hip Unilat With Pelvis 2-3 Views Left  05/05/2015   CLINICAL DATA:  Left hip pain after a fall.  EXAM: DG HIP (WITH OR WITHOUT PELVIS) 2-3V LEFT  COMPARISON:  Radiographs dated 03/26/2015 and 01/01/2013  FINDINGS: There is no evidence of hip fracture or dislocation. There is no evidence of arthropathy or other focal bone abnormality.  IMPRESSION: Negative.   Electronically Signed   By: Lorriane Shire M.D.   On: 05/05/2015 13:08   I have  personally reviewed and evaluated these lab results as part of my medical decision-making.   EKG Interpretation None      MDM   Final diagnoses:  Fall   scalp abrasion  Vertigo  81 year old female with history of vertigo and balance problems who presents with fall from standing that occurred at home. Patient denies loss of consciousness. She was brought in by EMS with c-collar on. Patient frail but comfortable on exam. Vital signs notable for hypertension and mild bradycardia. Patient had not taken her medications yet. No neurologic deficits on exam. Abrasion  on posterior scalp present without any deep lacerations. No obvious deformity of lower extremities. Obtained CTs of head and C-spine as well as left hip film. Images negative for acute findings. On reexamination, the patient remains well-appearing. She complains of dizziness which she states is like previous episodes of vertigo. Because she has responded to meclizine in the past, I gave her a small dose of meclizine here as well as her home blood pressure medications. Patient's head wound irrigated by nursing.  On reexamination, the patient is well-appearing. She has successfully ambulated with nursing staff. She complains of mild vertigo but is otherwise comfortable. Given that she has a history of vertigo and already has meclizine at home, I feel that she is safe for discharge. She has appointment with her PCP in a few days and I have encouraged her to follow-up as planned or sooner if symptoms worsen. I reviewed return precautions including altered mental status, extremity numbness/weakness, or any other new or alarming symptoms. The family voiced understanding. Prior to discharge, I contacted case management to assist with arranging physical therapy and I filled out a face-to-face encounter. All questions answered. Patient discharged in satisfactory condition.  Sharlett Iles, MD 05/05/15 [phone]

## 2015-05-05 NOTE — ED Notes (Signed)
Pt ambulated with walker (uses walker at home), pt tolerated well with minimal dizziness.  Gait steady.  MD aware and at bedside

## 2015-05-05 NOTE — Care Management Note (Signed)
Case Management Note  Patient Details  Name: [name] MRN: [id number] Date of Birth: [birth year]  Subjective/Objective:                  81 yo female from home alone after a fall. Pt fell backwards and hit head on the floor, pt takes Plavix, small lac to back of head, bleeding controlled, C-collar on. EMS reports pt has balance issues and is actually getting PT to help. Pt denies pain or LOC, a x 4. Pt also c /o dizziness with change in position.  Action/Plan: Follow for disposition.  Expected Discharge Date:      05/05/15            Expected Discharge Plan:  [facility]  In-House Referral:  NA  Discharge planning Services  CM Consult  Post Acute Care Choice:  Home Health Choice offered to:  Patient, Adult Children  DME Arranged:    DME Agency:     HH Arranged:  PT HH Agency:  [facility]  Status of Service:  In process, will continue to follow  Medicare Important Message Given:    Date Medicare IM Given:    Medicare IM give by:    Date Additional Medicare IM Given:    Additional Medicare Important Message give by:     If discussed at [facility] of Stay Meetings, dates discussed:    Additional Comments: Olubunmi Rothenberger J. Clydene Laming, RN, BSN, [employer] [phone] Spoke with pt at bedside regarding discharge planning for [facility]. Offered pt list of home health agencies to choose from.  Pt chose [facility] to render services. Wylene Simmer of [facility] notified.  No DME needs identified at this time.  Fuller Mandril, RN 05/05/2015, 2:48 PM

## 2015-05-10 DIAGNOSIS — I10 Essential (primary) hypertension: Secondary | ICD-10-CM | POA: Diagnosis not present

## 2015-05-10 DIAGNOSIS — E782 Mixed hyperlipidemia: Secondary | ICD-10-CM | POA: Diagnosis not present

## 2015-05-10 DIAGNOSIS — H8110 Benign paroxysmal vertigo, unspecified ear: Secondary | ICD-10-CM | POA: Diagnosis not present

## 2015-05-10 DIAGNOSIS — F324 Major depressive disorder, single episode, in partial remission: Secondary | ICD-10-CM | POA: Diagnosis not present

## 2015-05-10 DIAGNOSIS — G450 Vertebro-basilar artery syndrome: Secondary | ICD-10-CM | POA: Diagnosis not present

## 2015-05-10 DIAGNOSIS — R269 Unspecified abnormalities of gait and mobility: Secondary | ICD-10-CM | POA: Diagnosis not present

## 2015-05-10 DIAGNOSIS — I471 Supraventricular tachycardia: Secondary | ICD-10-CM | POA: Diagnosis not present

## 2015-05-10 DIAGNOSIS — K219 Gastro-esophageal reflux disease without esophagitis: Secondary | ICD-10-CM | POA: Diagnosis not present

## 2015-05-10 DIAGNOSIS — E039 Hypothyroidism, unspecified: Secondary | ICD-10-CM | POA: Diagnosis not present

## 2015-05-10 DIAGNOSIS — Z23 Encounter for immunization: Secondary | ICD-10-CM | POA: Diagnosis not present

## 2015-05-10 DIAGNOSIS — N183 Chronic kidney disease, stage 3 (moderate): Secondary | ICD-10-CM | POA: Diagnosis not present

## 2015-05-17 DIAGNOSIS — I129 Hypertensive chronic kidney disease with stage 1 through stage 4 chronic kidney disease, or unspecified chronic kidney disease: Secondary | ICD-10-CM | POA: Diagnosis not present

## 2015-05-17 DIAGNOSIS — N183 Chronic kidney disease, stage 3 (moderate): Secondary | ICD-10-CM | POA: Diagnosis not present

## 2015-05-17 DIAGNOSIS — G450 Vertebro-basilar artery syndrome: Secondary | ICD-10-CM | POA: Diagnosis not present

## 2015-05-17 DIAGNOSIS — H8110 Benign paroxysmal vertigo, unspecified ear: Secondary | ICD-10-CM | POA: Diagnosis not present

## 2015-05-18 DIAGNOSIS — G450 Vertebro-basilar artery syndrome: Secondary | ICD-10-CM | POA: Diagnosis not present

## 2015-05-18 DIAGNOSIS — H8110 Benign paroxysmal vertigo, unspecified ear: Secondary | ICD-10-CM | POA: Diagnosis not present

## 2015-05-21 DIAGNOSIS — H8110 Benign paroxysmal vertigo, unspecified ear: Secondary | ICD-10-CM | POA: Diagnosis not present

## 2015-05-21 DIAGNOSIS — G450 Vertebro-basilar artery syndrome: Secondary | ICD-10-CM | POA: Diagnosis not present

## 2015-05-24 DIAGNOSIS — H8110 Benign paroxysmal vertigo, unspecified ear: Secondary | ICD-10-CM | POA: Diagnosis not present

## 2015-05-24 DIAGNOSIS — G450 Vertebro-basilar artery syndrome: Secondary | ICD-10-CM | POA: Diagnosis not present

## 2015-05-26 DIAGNOSIS — H8110 Benign paroxysmal vertigo, unspecified ear: Secondary | ICD-10-CM | POA: Diagnosis not present

## 2015-05-26 DIAGNOSIS — G450 Vertebro-basilar artery syndrome: Secondary | ICD-10-CM | POA: Diagnosis not present

## 2015-06-01 DIAGNOSIS — G450 Vertebro-basilar artery syndrome: Secondary | ICD-10-CM | POA: Diagnosis not present

## 2015-06-01 DIAGNOSIS — H8110 Benign paroxysmal vertigo, unspecified ear: Secondary | ICD-10-CM | POA: Diagnosis not present

## 2015-07-12 DIAGNOSIS — R42 Dizziness and giddiness: Secondary | ICD-10-CM | POA: Diagnosis not present

## 2015-07-12 DIAGNOSIS — M5116 Intervertebral disc disorders with radiculopathy, lumbar region: Secondary | ICD-10-CM | POA: Diagnosis not present

## 2015-07-12 DIAGNOSIS — M5416 Radiculopathy, lumbar region: Secondary | ICD-10-CM | POA: Diagnosis not present

## 2015-07-13 ENCOUNTER — Other Ambulatory Visit: Payer: Self-pay | Admitting: Internal Medicine

## 2015-07-13 DIAGNOSIS — M5116 Intervertebral disc disorders with radiculopathy, lumbar region: Secondary | ICD-10-CM

## 2015-07-20 ENCOUNTER — Ambulatory Visit
Admission: RE | Admit: 2015-07-20 | Discharge: 2015-07-20 | Disposition: A | Discharge status: Home / Self Care | Payer: Medicare Other | Source: Ambulatory Visit | Attending: Internal Medicine | Admitting: Internal Medicine

## 2015-07-20 DIAGNOSIS — M5116 Intervertebral disc disorders with radiculopathy, lumbar region: Secondary | ICD-10-CM

## 2015-07-20 DIAGNOSIS — M5136 Other intervertebral disc degeneration, lumbar region: Secondary | ICD-10-CM | POA: Diagnosis not present

## 2015-07-20 MED ORDER — METHYLPREDNISOLONE ACETATE 40 MG/ML INJ SUSP (RADIOLOG
120.0000 mg | Freq: Once | INTRAMUSCULAR | Status: AC
  Administered 2015-07-20: 120 mg via EPIDURAL

## 2015-07-20 MED ORDER — IOHEXOL 180 MG/ML  SOLN
1.0000 mL | Freq: Once | INTRAMUSCULAR | Status: AC | PRN
  Administered 2015-07-20: 1 mL via EPIDURAL

## 2015-07-20 NOTE — Discharge Instructions (Signed)

## 2015-09-08 DIAGNOSIS — E782 Mixed hyperlipidemia: Secondary | ICD-10-CM | POA: Diagnosis not present

## 2015-09-08 DIAGNOSIS — H8110 Benign paroxysmal vertigo, unspecified ear: Secondary | ICD-10-CM | POA: Diagnosis not present

## 2015-09-08 DIAGNOSIS — N183 Chronic kidney disease, stage 3 (moderate): Secondary | ICD-10-CM | POA: Diagnosis not present

## 2015-09-08 DIAGNOSIS — M5090 Cervical disc disorder, unspecified, unspecified cervical region: Secondary | ICD-10-CM | POA: Diagnosis not present

## 2015-09-08 DIAGNOSIS — F324 Major depressive disorder, single episode, in partial remission: Secondary | ICD-10-CM | POA: Diagnosis not present

## 2015-09-08 DIAGNOSIS — G450 Vertebro-basilar artery syndrome: Secondary | ICD-10-CM | POA: Diagnosis not present

## 2015-09-08 DIAGNOSIS — I10 Essential (primary) hypertension: Secondary | ICD-10-CM | POA: Diagnosis not present

## 2015-09-08 DIAGNOSIS — E039 Hypothyroidism, unspecified: Secondary | ICD-10-CM | POA: Diagnosis not present

## 2015-09-08 DIAGNOSIS — R269 Unspecified abnormalities of gait and mobility: Secondary | ICD-10-CM | POA: Diagnosis not present

## 2015-09-08 DIAGNOSIS — I471 Supraventricular tachycardia: Secondary | ICD-10-CM | POA: Diagnosis not present

## 2015-10-11 DIAGNOSIS — D649 Anemia, unspecified: Secondary | ICD-10-CM | POA: Diagnosis not present

## 2015-11-10 DIAGNOSIS — H401413 Capsular glaucoma with pseudoexfoliation of lens, right eye, severe stage: Secondary | ICD-10-CM | POA: Diagnosis not present

## 2015-11-10 DIAGNOSIS — Z961 Presence of intraocular lens: Secondary | ICD-10-CM | POA: Diagnosis not present

## 2015-11-10 DIAGNOSIS — H401422 Capsular glaucoma with pseudoexfoliation of lens, left eye, moderate stage: Secondary | ICD-10-CM | POA: Diagnosis not present

## 2015-12-07 DIAGNOSIS — Z9181 History of falling: Secondary | ICD-10-CM | POA: Diagnosis not present

## 2015-12-07 DIAGNOSIS — M545 Low back pain: Secondary | ICD-10-CM | POA: Diagnosis not present

## 2015-12-07 DIAGNOSIS — M542 Cervicalgia: Secondary | ICD-10-CM | POA: Diagnosis not present

## 2016-01-05 DIAGNOSIS — R269 Unspecified abnormalities of gait and mobility: Secondary | ICD-10-CM | POA: Diagnosis not present

## 2016-01-05 DIAGNOSIS — Z1389 Encounter for screening for other disorder: Secondary | ICD-10-CM | POA: Diagnosis not present

## 2016-01-05 DIAGNOSIS — E039 Hypothyroidism, unspecified: Secondary | ICD-10-CM | POA: Diagnosis not present

## 2016-01-05 DIAGNOSIS — D649 Anemia, unspecified: Secondary | ICD-10-CM | POA: Diagnosis not present

## 2016-01-05 DIAGNOSIS — I471 Supraventricular tachycardia: Secondary | ICD-10-CM | POA: Diagnosis not present

## 2016-01-05 DIAGNOSIS — I10 Essential (primary) hypertension: Secondary | ICD-10-CM | POA: Diagnosis not present

## 2016-01-05 DIAGNOSIS — G450 Vertebro-basilar artery syndrome: Secondary | ICD-10-CM | POA: Diagnosis not present

## 2016-01-05 DIAGNOSIS — K219 Gastro-esophageal reflux disease without esophagitis: Secondary | ICD-10-CM | POA: Diagnosis not present

## 2016-01-05 DIAGNOSIS — F324 Major depressive disorder, single episode, in partial remission: Secondary | ICD-10-CM | POA: Diagnosis not present

## 2016-01-06 DIAGNOSIS — G450 Vertebro-basilar artery syndrome: Secondary | ICD-10-CM | POA: Diagnosis not present

## 2016-01-06 DIAGNOSIS — R2689 Other abnormalities of gait and mobility: Secondary | ICD-10-CM | POA: Diagnosis not present

## 2016-01-06 DIAGNOSIS — N183 Chronic kidney disease, stage 3 (moderate): Secondary | ICD-10-CM | POA: Diagnosis not present

## 2016-01-06 DIAGNOSIS — I129 Hypertensive chronic kidney disease with stage 1 through stage 4 chronic kidney disease, or unspecified chronic kidney disease: Secondary | ICD-10-CM | POA: Diagnosis not present

## 2016-01-07 DIAGNOSIS — G450 Vertebro-basilar artery syndrome: Secondary | ICD-10-CM | POA: Diagnosis not present

## 2016-01-07 DIAGNOSIS — R2689 Other abnormalities of gait and mobility: Secondary | ICD-10-CM | POA: Diagnosis not present

## 2016-01-11 DIAGNOSIS — G450 Vertebro-basilar artery syndrome: Secondary | ICD-10-CM | POA: Diagnosis not present

## 2016-01-11 DIAGNOSIS — R2689 Other abnormalities of gait and mobility: Secondary | ICD-10-CM | POA: Diagnosis not present

## 2016-01-12 DIAGNOSIS — G450 Vertebro-basilar artery syndrome: Secondary | ICD-10-CM | POA: Diagnosis not present

## 2016-01-12 DIAGNOSIS — R2689 Other abnormalities of gait and mobility: Secondary | ICD-10-CM | POA: Diagnosis not present

## 2016-01-13 DIAGNOSIS — G450 Vertebro-basilar artery syndrome: Secondary | ICD-10-CM | POA: Diagnosis not present

## 2016-01-13 DIAGNOSIS — R2689 Other abnormalities of gait and mobility: Secondary | ICD-10-CM | POA: Diagnosis not present

## 2016-01-17 DIAGNOSIS — R2689 Other abnormalities of gait and mobility: Secondary | ICD-10-CM | POA: Diagnosis not present

## 2016-01-17 DIAGNOSIS — G450 Vertebro-basilar artery syndrome: Secondary | ICD-10-CM | POA: Diagnosis not present

## 2016-01-19 DIAGNOSIS — R2689 Other abnormalities of gait and mobility: Secondary | ICD-10-CM | POA: Diagnosis not present

## 2016-01-19 DIAGNOSIS — G450 Vertebro-basilar artery syndrome: Secondary | ICD-10-CM | POA: Diagnosis not present

## 2016-01-20 DIAGNOSIS — R2689 Other abnormalities of gait and mobility: Secondary | ICD-10-CM | POA: Diagnosis not present

## 2016-01-20 DIAGNOSIS — G450 Vertebro-basilar artery syndrome: Secondary | ICD-10-CM | POA: Diagnosis not present

## 2016-01-24 DIAGNOSIS — R2689 Other abnormalities of gait and mobility: Secondary | ICD-10-CM | POA: Diagnosis not present

## 2016-01-24 DIAGNOSIS — G450 Vertebro-basilar artery syndrome: Secondary | ICD-10-CM | POA: Diagnosis not present

## 2016-01-26 DIAGNOSIS — R2689 Other abnormalities of gait and mobility: Secondary | ICD-10-CM | POA: Diagnosis not present

## 2016-01-26 DIAGNOSIS — G450 Vertebro-basilar artery syndrome: Secondary | ICD-10-CM | POA: Diagnosis not present

## 2016-01-27 DIAGNOSIS — G450 Vertebro-basilar artery syndrome: Secondary | ICD-10-CM | POA: Diagnosis not present

## 2016-01-27 DIAGNOSIS — R2689 Other abnormalities of gait and mobility: Secondary | ICD-10-CM | POA: Diagnosis not present

## 2016-02-02 DIAGNOSIS — G450 Vertebro-basilar artery syndrome: Secondary | ICD-10-CM | POA: Diagnosis not present

## 2016-02-02 DIAGNOSIS — R2689 Other abnormalities of gait and mobility: Secondary | ICD-10-CM | POA: Diagnosis not present

## 2016-02-09 DIAGNOSIS — R2689 Other abnormalities of gait and mobility: Secondary | ICD-10-CM | POA: Diagnosis not present

## 2016-02-09 DIAGNOSIS — G450 Vertebro-basilar artery syndrome: Secondary | ICD-10-CM | POA: Diagnosis not present

## 2016-03-22 DIAGNOSIS — H401413 Capsular glaucoma with pseudoexfoliation of lens, right eye, severe stage: Secondary | ICD-10-CM | POA: Diagnosis not present

## 2016-03-22 DIAGNOSIS — Z961 Presence of intraocular lens: Secondary | ICD-10-CM | POA: Diagnosis not present

## 2016-03-22 DIAGNOSIS — H401422 Capsular glaucoma with pseudoexfoliation of lens, left eye, moderate stage: Secondary | ICD-10-CM | POA: Diagnosis not present

## 2016-05-16 DIAGNOSIS — M810 Age-related osteoporosis without current pathological fracture: Secondary | ICD-10-CM | POA: Diagnosis not present

## 2016-05-16 DIAGNOSIS — I10 Essential (primary) hypertension: Secondary | ICD-10-CM | POA: Diagnosis not present

## 2016-05-16 DIAGNOSIS — Z23 Encounter for immunization: Secondary | ICD-10-CM | POA: Diagnosis not present

## 2016-05-16 DIAGNOSIS — N183 Chronic kidney disease, stage 3 (moderate): Secondary | ICD-10-CM | POA: Diagnosis not present

## 2016-05-16 DIAGNOSIS — E782 Mixed hyperlipidemia: Secondary | ICD-10-CM | POA: Diagnosis not present

## 2016-05-16 DIAGNOSIS — F324 Major depressive disorder, single episode, in partial remission: Secondary | ICD-10-CM | POA: Diagnosis not present

## 2016-05-16 DIAGNOSIS — G450 Vertebro-basilar artery syndrome: Secondary | ICD-10-CM | POA: Diagnosis not present

## 2016-05-16 DIAGNOSIS — D649 Anemia, unspecified: Secondary | ICD-10-CM | POA: Diagnosis not present

## 2016-05-16 DIAGNOSIS — E039 Hypothyroidism, unspecified: Secondary | ICD-10-CM | POA: Diagnosis not present

## 2016-05-16 DIAGNOSIS — I471 Supraventricular tachycardia: Secondary | ICD-10-CM | POA: Diagnosis not present

## 2016-07-21 DIAGNOSIS — H401413 Capsular glaucoma with pseudoexfoliation of lens, right eye, severe stage: Secondary | ICD-10-CM | POA: Diagnosis not present

## 2016-07-21 DIAGNOSIS — H401422 Capsular glaucoma with pseudoexfoliation of lens, left eye, moderate stage: Secondary | ICD-10-CM | POA: Diagnosis not present

## 2016-09-05 DIAGNOSIS — H401413 Capsular glaucoma with pseudoexfoliation of lens, right eye, severe stage: Secondary | ICD-10-CM | POA: Diagnosis not present

## 2016-09-05 DIAGNOSIS — H401422 Capsular glaucoma with pseudoexfoliation of lens, left eye, moderate stage: Secondary | ICD-10-CM | POA: Diagnosis not present

## 2016-09-19 DIAGNOSIS — N6452 Nipple discharge: Secondary | ICD-10-CM | POA: Diagnosis not present

## 2016-09-19 DIAGNOSIS — Z1389 Encounter for screening for other disorder: Secondary | ICD-10-CM | POA: Diagnosis not present

## 2016-09-19 DIAGNOSIS — E782 Mixed hyperlipidemia: Secondary | ICD-10-CM | POA: Diagnosis not present

## 2016-09-19 DIAGNOSIS — I10 Essential (primary) hypertension: Secondary | ICD-10-CM | POA: Diagnosis not present

## 2016-09-19 DIAGNOSIS — E039 Hypothyroidism, unspecified: Secondary | ICD-10-CM | POA: Diagnosis not present

## 2016-09-19 DIAGNOSIS — N183 Chronic kidney disease, stage 3 (moderate): Secondary | ICD-10-CM | POA: Diagnosis not present

## 2016-09-19 DIAGNOSIS — G450 Vertebro-basilar artery syndrome: Secondary | ICD-10-CM | POA: Diagnosis not present

## 2016-09-19 DIAGNOSIS — M5090 Cervical disc disorder, unspecified, unspecified cervical region: Secondary | ICD-10-CM | POA: Diagnosis not present

## 2016-09-19 DIAGNOSIS — K219 Gastro-esophageal reflux disease without esophagitis: Secondary | ICD-10-CM | POA: Diagnosis not present

## 2016-09-19 DIAGNOSIS — M1990 Unspecified osteoarthritis, unspecified site: Secondary | ICD-10-CM | POA: Diagnosis not present

## 2016-09-19 DIAGNOSIS — Z0000 Encounter for general adult medical examination without abnormal findings: Secondary | ICD-10-CM | POA: Diagnosis not present

## 2016-09-19 DIAGNOSIS — F324 Major depressive disorder, single episode, in partial remission: Secondary | ICD-10-CM | POA: Diagnosis not present

## 2016-09-19 DIAGNOSIS — H409 Unspecified glaucoma: Secondary | ICD-10-CM | POA: Diagnosis not present

## 2016-09-19 DIAGNOSIS — R7309 Other abnormal glucose: Secondary | ICD-10-CM | POA: Diagnosis not present

## 2016-10-16 ENCOUNTER — Emergency Department (HOSPITAL_COMMUNITY): Payer: Medicare Other

## 2016-10-16 ENCOUNTER — Inpatient Hospital Stay (HOSPITAL_COMMUNITY)
Admission: EM | Admit: 2016-10-16 | Discharge: 2016-10-20 | DRG: 469 | Disposition: A | Discharge status: Skilled Nursing Facility | Payer: Medicare Other | Attending: Internal Medicine | Admitting: Internal Medicine

## 2016-10-16 ENCOUNTER — Encounter (HOSPITAL_COMMUNITY): Payer: Self-pay | Admitting: Emergency Medicine

## 2016-10-16 DIAGNOSIS — S72012A Unspecified intracapsular fracture of left femur, initial encounter for closed fracture: Secondary | ICD-10-CM | POA: Diagnosis present

## 2016-10-16 DIAGNOSIS — D62 Acute posthemorrhagic anemia: Secondary | ICD-10-CM | POA: Diagnosis not present

## 2016-10-16 DIAGNOSIS — W19XXXA Unspecified fall, initial encounter: Secondary | ICD-10-CM | POA: Diagnosis present

## 2016-10-16 DIAGNOSIS — Z66 Do not resuscitate: Secondary | ICD-10-CM | POA: Diagnosis not present

## 2016-10-16 DIAGNOSIS — S72009A Fracture of unspecified part of neck of unspecified femur, initial encounter for closed fracture: Secondary | ICD-10-CM | POA: Diagnosis not present

## 2016-10-16 DIAGNOSIS — M80052A Age-related osteoporosis with current pathological fracture, left femur, initial encounter for fracture: Secondary | ICD-10-CM | POA: Diagnosis present

## 2016-10-16 DIAGNOSIS — R030 Elevated blood-pressure reading, without diagnosis of hypertension: Secondary | ICD-10-CM | POA: Diagnosis not present

## 2016-10-16 DIAGNOSIS — I10 Essential (primary) hypertension: Secondary | ICD-10-CM | POA: Diagnosis not present

## 2016-10-16 DIAGNOSIS — Z961 Presence of intraocular lens: Secondary | ICD-10-CM | POA: Diagnosis present

## 2016-10-16 DIAGNOSIS — T40605A Adverse effect of unspecified narcotics, initial encounter: Secondary | ICD-10-CM | POA: Diagnosis not present

## 2016-10-16 DIAGNOSIS — Z9842 Cataract extraction status, left eye: Secondary | ICD-10-CM | POA: Diagnosis not present

## 2016-10-16 DIAGNOSIS — Z885 Allergy status to narcotic agent status: Secondary | ICD-10-CM | POA: Diagnosis not present

## 2016-10-16 DIAGNOSIS — Z79899 Other long term (current) drug therapy: Secondary | ICD-10-CM

## 2016-10-16 DIAGNOSIS — I471 Supraventricular tachycardia: Secondary | ICD-10-CM | POA: Diagnosis present

## 2016-10-16 DIAGNOSIS — M25552 Pain in left hip: Secondary | ICD-10-CM | POA: Diagnosis not present

## 2016-10-16 DIAGNOSIS — Z85828 Personal history of other malignant neoplasm of skin: Secondary | ICD-10-CM | POA: Diagnosis not present

## 2016-10-16 DIAGNOSIS — K5900 Constipation, unspecified: Secondary | ICD-10-CM | POA: Diagnosis not present

## 2016-10-16 DIAGNOSIS — S72002D Fracture of unspecified part of neck of left femur, subsequent encounter for closed fracture with routine healing: Secondary | ICD-10-CM | POA: Diagnosis not present

## 2016-10-16 DIAGNOSIS — Z96649 Presence of unspecified artificial hip joint: Secondary | ICD-10-CM

## 2016-10-16 DIAGNOSIS — Z419 Encounter for procedure for purposes other than remedying health state, unspecified: Secondary | ICD-10-CM

## 2016-10-16 DIAGNOSIS — Y92230 Patient room in hospital as the place of occurrence of the external cause: Secondary | ICD-10-CM | POA: Diagnosis not present

## 2016-10-16 DIAGNOSIS — R278 Other lack of coordination: Secondary | ICD-10-CM | POA: Diagnosis not present

## 2016-10-16 DIAGNOSIS — R54 Age-related physical debility: Secondary | ICD-10-CM | POA: Diagnosis not present

## 2016-10-16 DIAGNOSIS — Z7902 Long term (current) use of antithrombotics/antiplatelets: Secondary | ICD-10-CM | POA: Diagnosis not present

## 2016-10-16 DIAGNOSIS — K219 Gastro-esophageal reflux disease without esophagitis: Secondary | ICD-10-CM | POA: Diagnosis not present

## 2016-10-16 DIAGNOSIS — Z9841 Cataract extraction status, right eye: Secondary | ICD-10-CM | POA: Diagnosis not present

## 2016-10-16 DIAGNOSIS — Z90710 Acquired absence of both cervix and uterus: Secondary | ICD-10-CM

## 2016-10-16 DIAGNOSIS — E039 Hypothyroidism, unspecified: Secondary | ICD-10-CM | POA: Diagnosis present

## 2016-10-16 DIAGNOSIS — E782 Mixed hyperlipidemia: Secondary | ICD-10-CM | POA: Diagnosis present

## 2016-10-16 DIAGNOSIS — Y929 Unspecified place or not applicable: Secondary | ICD-10-CM

## 2016-10-16 DIAGNOSIS — G92 Toxic encephalopathy: Secondary | ICD-10-CM | POA: Diagnosis not present

## 2016-10-16 DIAGNOSIS — R41841 Cognitive communication deficit: Secondary | ICD-10-CM | POA: Diagnosis not present

## 2016-10-16 DIAGNOSIS — M25551 Pain in right hip: Secondary | ICD-10-CM | POA: Diagnosis not present

## 2016-10-16 DIAGNOSIS — S72002A Fracture of unspecified part of neck of left femur, initial encounter for closed fracture: Secondary | ICD-10-CM

## 2016-10-16 DIAGNOSIS — Z9181 History of falling: Secondary | ICD-10-CM

## 2016-10-16 DIAGNOSIS — S72052A Unspecified fracture of head of left femur, initial encounter for closed fracture: Secondary | ICD-10-CM | POA: Diagnosis not present

## 2016-10-16 DIAGNOSIS — Z96642 Presence of left artificial hip joint: Secondary | ICD-10-CM | POA: Diagnosis not present

## 2016-10-16 DIAGNOSIS — T1490XA Injury, unspecified, initial encounter: Secondary | ICD-10-CM | POA: Diagnosis not present

## 2016-10-16 DIAGNOSIS — R52 Pain, unspecified: Secondary | ICD-10-CM

## 2016-10-16 DIAGNOSIS — K3580 Unspecified acute appendicitis: Secondary | ICD-10-CM | POA: Diagnosis not present

## 2016-10-16 DIAGNOSIS — S299XXA Unspecified injury of thorax, initial encounter: Secondary | ICD-10-CM | POA: Diagnosis not present

## 2016-10-16 DIAGNOSIS — R296 Repeated falls: Secondary | ICD-10-CM | POA: Diagnosis present

## 2016-10-16 DIAGNOSIS — W010XXA Fall on same level from slipping, tripping and stumbling without subsequent striking against object, initial encounter: Secondary | ICD-10-CM | POA: Diagnosis present

## 2016-10-16 DIAGNOSIS — I739 Peripheral vascular disease, unspecified: Secondary | ICD-10-CM | POA: Diagnosis present

## 2016-10-16 DIAGNOSIS — R0902 Hypoxemia: Secondary | ICD-10-CM | POA: Diagnosis not present

## 2016-10-16 DIAGNOSIS — F329 Major depressive disorder, single episode, unspecified: Secondary | ICD-10-CM | POA: Diagnosis not present

## 2016-10-16 DIAGNOSIS — Z889 Allergy status to unspecified drugs, medicaments and biological substances status: Secondary | ICD-10-CM | POA: Diagnosis not present

## 2016-10-16 DIAGNOSIS — S72042A Displaced fracture of base of neck of left femur, initial encounter for closed fracture: Secondary | ICD-10-CM | POA: Diagnosis not present

## 2016-10-16 DIAGNOSIS — R259 Unspecified abnormal involuntary movements: Secondary | ICD-10-CM | POA: Diagnosis not present

## 2016-10-16 DIAGNOSIS — Z471 Aftercare following joint replacement surgery: Secondary | ICD-10-CM | POA: Diagnosis not present

## 2016-10-16 DIAGNOSIS — M6281 Muscle weakness (generalized): Secondary | ICD-10-CM | POA: Diagnosis not present

## 2016-10-16 LAB — CBC WITH DIFFERENTIAL/PLATELET
BASOS ABS: 0 10*3/uL (ref 0.0–0.1)
Basophils Relative: 0 %
EOS PCT: 2 %
Eosinophils Absolute: 0.2 10*3/uL (ref 0.0–0.7)
HEMATOCRIT: 40.6 % (ref 36.0–46.0)
HEMOGLOBIN: 13.8 g/dL (ref 12.0–15.0)
LYMPHS PCT: 12 %
Lymphs Abs: 1 10*3/uL (ref 0.7–4.0)
MCH: 32.2 pg (ref 26.0–34.0)
MCHC: 34 g/dL (ref 30.0–36.0)
MCV: 94.6 fL (ref 78.0–100.0)
MONOS PCT: 5 %
Monocytes Absolute: 0.4 10*3/uL (ref 0.1–1.0)
NEUTROS ABS: 6.7 10*3/uL (ref 1.7–7.7)
Neutrophils Relative %: 81 %
Platelets: 175 10*3/uL (ref 150–400)
RBC: 4.29 MIL/uL (ref 3.87–5.11)
RDW: 13.4 % (ref 11.5–15.5)
WBC: 8.3 10*3/uL (ref 4.0–10.5)

## 2016-10-16 LAB — BASIC METABOLIC PANEL
ANION GAP: 8 (ref 5–15)
BUN: 25 mg/dL — ABNORMAL HIGH (ref 6–20)
CHLORIDE: 109 mmol/L (ref 101–111)
CO2: 22 mmol/L (ref 22–32)
Calcium: 10.1 mg/dL (ref 8.9–10.3)
Creatinine, Ser: 0.98 mg/dL (ref 0.44–1.00)
GFR calc non Af Amer: 48 mL/min — ABNORMAL LOW (ref >60)
GFR, EST AFRICAN AMERICAN: 56 mL/min — AB (ref >60)
Glucose, Bld: 115 mg/dL — ABNORMAL HIGH (ref 65–99)
Potassium: 4 mmol/L (ref 3.5–5.1)
Sodium: 139 mmol/L (ref 135–145)

## 2016-10-16 LAB — PROTIME-INR
INR: 1.13
Prothrombin Time: 14.6 seconds (ref 11.4–15.2)

## 2016-10-16 MED ORDER — MORPHINE SULFATE (PF) 4 MG/ML IV SOLN
0.5000 mg | INTRAVENOUS | Status: DC | PRN
  Administered 2016-10-17 – 2016-10-18 (×6): 0.52 mg via INTRAVENOUS
  Filled 2016-10-16 (×6): qty 1

## 2016-10-16 MED ORDER — MORPHINE SULFATE (PF) 4 MG/ML IV SOLN
4.0000 mg | Freq: Once | INTRAVENOUS | Status: AC
  Administered 2016-10-16: 4 mg via INTRAVENOUS
  Filled 2016-10-16: qty 1

## 2016-10-16 MED ORDER — DILTIAZEM HCL ER COATED BEADS 120 MG PO CP24
120.0000 mg | ORAL_CAPSULE | Freq: Every evening | ORAL | Status: DC
  Administered 2016-10-17 – 2016-10-19 (×2): 120 mg via ORAL
  Filled 2016-10-16 (×4): qty 1

## 2016-10-16 MED ORDER — SODIUM CHLORIDE 0.9 % IV SOLN
INTRAVENOUS | Status: DC
  Administered 2016-10-17 (×2): via INTRAVENOUS

## 2016-10-16 MED ORDER — HYDROCODONE-ACETAMINOPHEN 5-325 MG PO TABS
1.0000 | ORAL_TABLET | Freq: Four times a day (QID) | ORAL | Status: DC | PRN
  Administered 2016-10-17 (×3): 2 via ORAL
  Filled 2016-10-16 (×3): qty 2

## 2016-10-16 MED ORDER — METOPROLOL TARTRATE 50 MG PO TABS
50.0000 mg | ORAL_TABLET | Freq: Two times a day (BID) | ORAL | Status: DC
  Administered 2016-10-17 – 2016-10-20 (×7): 50 mg via ORAL
  Filled 2016-10-16 (×7): qty 1

## 2016-10-16 NOTE — ED Provider Notes (Signed)
[facility] DEPT Provider Note   CSN: [id number] Arrival date & time: 10/16/16  1928     History   Chief Complaint Chief Complaint  Patient presents with  . Fall    HPI [name] is a 83 y.o. female.  The history is provided by the patient, a relative and medical records. No language interpreter was used.  Hip Pain  This is a new problem. The current episode started 3 to 5 hours ago. The problem occurs constantly. The problem has not changed since onset.Pertinent negatives include no chest pain, no abdominal pain, no headaches and no shortness of breath. The symptoms are aggravated by bending. Nothing relieves the symptoms. She has tried nothing for the symptoms. The treatment provided no relief.    Past Medical History:  Diagnosis Date  . Anemia   . Anginal pain ([place])   . Arthritis    "all over; doesn't seem to bother me much" (08/22/2013)  . Chronic lower back pain   . Dysrhythmia    "irregular heart beat"   . Exertional shortness of breath   . GERD (gastroesophageal reflux disease)   . Hypertension   . Hypothyroidism   . Migraines    "used to"  . Mixed hyperlipidemia   . Osteoporosis   . Paroxysmal supraventricular tachycardia ([place])   . Skin cancer    "couple taken off right leg" (08/22/2013)    Patient Active Problem List   Diagnosis Date Noted  . Dry eye 04/21/2015  . H/O surgical procedure 05/25/2014  . 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 Pseudoaphakia 04/10/2014  . Acute appendicitis 08/22/2013    Past Surgical History:  Procedure Laterality Date  . ABDOMINAL HYSTERECTOMY    . APPENDECTOMY  08/22/2013  . BACK SURGERY    . CATARACT EXTRACTION W/ INTRAOCULAR LENS  IMPLANT, BILATERAL    . LAPAROSCOPIC APPENDECTOMY N/A 08/22/2013   Procedure: APPENDECTOMY LAPAROSCOPIC;  Surgeon: Imogene Burn. [name], MD;  Location: [facility];  Service: General;  Laterality: N/A;  . LUMBAR [place] SURGERY  2008  . THROAT SURGERY  1940's; ?   "cysts removed"     OB  History    No data available       Home Medications    Prior to Admission medications   Medication Sig Start Date End Date Taking? Authorizing Provider  bimatoprost (LUMIGAN) 0.01 % SOLN Place 1 drop into the left eye at bedtime. 05/07/14   Historical Provider, MD  BIOTIN PO Take 1 tablet by mouth daily.    Historical Provider, MD  brimonidine (ALPHAGAN P) 0.1 % SOLN Place 1 drop into the left eye 3 (three) times daily. 05/07/14   Historical Provider, MD  butalbital-acetaminophen-caffeine (FIORICET, ESGIC) 50-325-40 MG per tablet Take 1 tablet by mouth every 4 (four) hours as needed for headache.    Historical Provider, MD  Calcium Carbonate-Vitamin D (CALCIUM 600+D) 600-200 MG-UNIT TABS Take 1 tablet by mouth daily.    Historical Provider, MD  CARTIA XT 120 MG 24 hr capsule  07/29/13   Historical Provider, MD  clopidogrel (PLAVIX) 75 MG tablet Take 37.5 mg by mouth every other day.    Historical Provider, MD  diazepam (VALIUM) 5 MG tablet Take 2.5 mg by mouth every 6 (six) hours as needed for anxiety.    Historical Provider, MD  diltiazem (DILACOR XR) 120 MG 24 hr capsule Take 120 mg by mouth daily.    Historical Provider, MD  dorzolamide-timolol (COSOPT) 22.3-6.8 MG/ML ophthalmic solution Place 1 drop into the  left eye 2 (two) times daily. 05/07/14   Historical Provider, MD  fexofenadine (ALLEGRA) 180 MG tablet Take 180 mg by mouth daily.    Historical Provider, MD  FLUOCINONIDE EX Apply 2 drops topically daily.    Historical Provider, MD  gemfibrozil (LOPID) 600 MG tablet Take 600 mg by mouth 2 (two) times daily before a meal.    Historical Provider, MD  latanoprost (XALATAN) 0.005 % ophthalmic solution Place 1 drop into both eyes at bedtime.    Historical Provider, MD  levothyroxine (SYNTHROID, LEVOTHROID) 50 MCG tablet Take 50 mcg by mouth 2 (two) times daily.     Historical Provider, MD  lisinopril (PRINIVIL,ZESTRIL) 20 MG tablet Take 20 mg by mouth daily.    Historical Provider, MD    meclizine (ANTIVERT) 25 MG tablet Take 25 mg by mouth 3 (three) times daily as needed for dizziness.    Historical Provider, MD  metoprolol (LOPRESSOR) 50 MG tablet Take 50 mg by mouth 2 (two) times daily.    Historical Provider, MD  nystatin-triamcinolone ointment (MYCOLOG) Apply 1 application topically 2 (two) times daily.    Historical Provider, MD  Omega-3 Fatty Acids (FISH OIL) 1000 MG CAPS Take 1 capsule by mouth 2 (two) times daily.    Historical Provider, MD  omeprazole (PRILOSEC) 20 MG capsule Take 20 mg by mouth daily.    Historical Provider, MD  sertraline (ZOLOFT) 100 MG tablet Take 100 mg by mouth at bedtime.     Historical Provider, MD    Family History No family history on file.  Social History Social History  Substance Use Topics  . Smoking status: Never Smoker  . Smokeless tobacco: Never Used  . Alcohol use No     Allergies   Codeine   Review of Systems Review of Systems  Constitutional: Negative for activity change, chills, diaphoresis, fatigue and fever.  HENT: Negative for congestion and rhinorrhea.   Eyes: Negative for visual disturbance.  Respiratory: Negative for cough, chest tightness, shortness of breath and stridor.   Cardiovascular: Negative for chest pain, palpitations and leg swelling.  Gastrointestinal: Negative for abdominal distention, abdominal pain, constipation, diarrhea, nausea and vomiting.  Genitourinary: Negative for difficulty urinating, dysuria, flank pain, frequency, hematuria, menstrual problem, pelvic pain, vaginal bleeding and vaginal discharge.  Musculoskeletal: Negative for back pain, neck pain and neck stiffness.  Skin: Negative for rash and wound.  Neurological: Negative for dizziness, syncope, weakness, light-headedness, numbness and headaches.  Psychiatric/Behavioral: Negative for agitation and confusion.  All other systems reviewed and are negative.    Physical Exam Updated Vital Signs BP (!) 222/97   Pulse 70   Temp  97.6 F (36.4 C) (Oral)   Ht 4\' 11"  (1.499 m)   Wt 108 lb (49 kg)   SpO2 93%   BMI 21.81 kg/m   Physical Exam  Constitutional: She is oriented to person, place, and time. She appears well-developed and well-nourished. No distress.  HENT:  Head: Normocephalic and atraumatic.  Right Ear: External ear normal.  Left Ear: External ear normal.  Nose: Nose normal.  Mouth/Throat: Oropharynx is clear and moist. No oropharyngeal exudate.  Eyes: Conjunctivae and EOM are normal. Pupils are equal, round, and reactive to light.  Neck: Normal range of motion. Neck supple.  Cardiovascular: Normal rate and normal heart sounds.   No murmur heard. Pulmonary/Chest: Effort normal. No stridor. No respiratory distress. She has no wheezes. She exhibits no tenderness.  Abdominal: Soft. She exhibits no distension. There is no tenderness. There  is no rebound.  Musculoskeletal: She exhibits tenderness.       Legs: Neurological: She is alert and oriented to person, place, and time. She has normal reflexes. No cranial nerve deficit or sensory deficit. She exhibits normal muscle tone. Coordination normal.  Skin: Skin is warm. Capillary refill takes less than 2 seconds. No rash noted. She is not diaphoretic. No erythema.  Psychiatric: She has a normal mood and affect.  Nursing note and vitals reviewed.    ED Treatments / Results  Labs (all labs ordered are listed, but only abnormal results are displayed) Labs Reviewed  BASIC METABOLIC PANEL - Abnormal; Notable for the following:       Result Value   Glucose, Bld 115 (*)    BUN 25 (*)    GFR calc non Af Amer 48 (*)    GFR calc Af Amer 56 (*)    All other components within normal limits  CBC - Abnormal; Notable for the following:    RBC 3.81 (*)    HCT 35.8 (*)    All other components within normal limits  BASIC METABOLIC PANEL - Abnormal; Notable for the following:    Glucose, Bld 117 (*)    BUN 26 (*)    GFR calc non Af Amer 50 (*)    GFR calc Af  Amer 58 (*)    All other components within normal limits  CBC WITH DIFFERENTIAL/PLATELET  PROTIME-INR  TYPE AND SCREEN    EKG  EKG Interpretation  Date/Time:  Monday October 16 2016 20:19:42 EST Ventricular Rate:  77 PR Interval:    QRS Duration: 89 QT Interval:  385 QTC Calculation: 436 R Axis:   5 Text Interpretation:  Sinus rhythm Left ventricular hypertrophy no STEMI Confirmed by Sherry Ruffing MD, Ethelle Ola [phone]) on 10/17/2016 11:11:37 AM       Radiology Dg Chest 1 View  Result Date: 10/16/2016 CLINICAL DATA:  Acute onset of left femur pain after fall. Concern for chest injury. Initial encounter. EXAM: CHEST 1 VIEW COMPARISON:  Chest radiograph performed 06/03/2007 FINDINGS: The lungs are well-aerated. Vascular congestion is noted. Peribronchial thickening is seen. There is no evidence of pleural effusion or pneumothorax. The cardiomediastinal silhouette is borderline normal in size. No acute osseous abnormalities are seen. IMPRESSION: Vascular congestion noted. Peribronchial thickening seen. No displaced rib fractures identified. Electronically Signed   By: Garald Balding M.D.   On: 10/16/2016 21:15   Dg Pelvis 1-2 Views  Result Date: 10/16/2016 CLINICAL DATA:  Acute onset left femur pain after falling. Initial encounter. EXAM: PELVIS - 1-2 VIEW COMPARISON:  Left hip radiographs from 05/05/2015 FINDINGS: There is a displaced subcapital fracture of the left femoral neck, with superior displacement of the distal femur. There is mild superior joint space narrowing at the left hip. Mild degenerative change is noted at the lower lumbar spine. The sacroiliac joints are unremarkable in appearance. The visualized bowel gas pattern is grossly unremarkable in appearance. IMPRESSION: Displaced subcapital fracture of the left femoral neck, with superior displacement of the distal femur. Electronically Signed   By: Garald Balding M.D.   On: 10/16/2016 21:31   Dg Femur Min 2 Views Left  Result  Date: 10/16/2016 CLINICAL DATA:  Status post fall, with proximal left femur pain. Initial encounter. EXAM: LEFT FEMUR 2 VIEWS COMPARISON:  Left hip radiographs performed 05/05/2015 FINDINGS: There is a displaced subcapital fracture through the left femoral neck, with superior displacement of the distal femur. The left femoral head remains seated at  the acetabulum. The left femur is otherwise intact. Soft tissue swelling is noted overlying the left hip. The knee joint is grossly unremarkable, though not well assessed. No knee joint effusion is seen. Mild vascular calcifications are seen. IMPRESSION: 1. Displaced subcapital fracture through the left femoral neck, with superior displacement of the distal femur. 2. Mild vascular calcifications seen. Electronically Signed   By: Garald Balding M.D.   On: 10/16/2016 21:16    Procedures Procedures (including critical care time)  Medications Ordered in ED Medications  latanoprost (XALATAN) 0.005 % ophthalmic solution 1 drop (1 drop Both Eyes Given 10/17/16 0148)  ferrous sulfate tablet 325 mg (not administered)  brimonidine (ALPHAGAN) 0.15 % ophthalmic solution 1 drop (1 drop Both Eyes Given 10/17/16 1004)  diltiazem (CARDIZEM CD) 24 hr capsule 120 mg (not administered)  gemfibrozil (LOPID) tablet 600 mg (600 mg Oral Given 10/17/16 0839)  metoprolol tartrate (LOPRESSOR) tablet 50 mg (50 mg Oral Given 10/17/16 0958)  pantoprazole (PROTONIX) EC tablet 40 mg (40 mg Oral Given 10/17/16 0958)  sertraline (ZOLOFT) tablet 100 mg (100 mg Oral Given 10/17/16 0148)  HYDROcodone-acetaminophen (NORCO/VICODIN) 5-325 MG per tablet 1-2 tablet (2 tablets Oral Given 10/17/16 0848)  morphine 4 MG/ML injection 0.52 mg (not administered)  docusate sodium (COLACE) capsule 100 mg (100 mg Oral Given 10/17/16 0957)  polyethylene glycol (MIRALAX / GLYCOLAX) packet 17 g (not administered)  bisacodyl (DULCOLAX) suppository 10 mg (not administered)  dorzolamide (TRUSOPT) 2 % ophthalmic  solution 1 drop (1 drop Left Eye Given 10/17/16 0958)    And  timolol (TIMOPTIC) 0.5 % ophthalmic solution 1 drop (1 drop Left Eye Given 10/17/16 0957)  levothyroxine (SYNTHROID, LEVOTHROID) tablet 25 mcg (not administered)  levothyroxine (SYNTHROID, LEVOTHROID) tablet 50 mcg (50 mcg Oral Given 10/17/16 0839)  morphine 4 MG/ML injection 4 mg (4 mg Intravenous Given 10/16/16 2034)  enoxaparin (LOVENOX) injection 30 mg (30 mg Subcutaneous Given 10/17/16 0958)     Initial Impression / Assessment and Plan / ED Course  I have reviewed the triage vital signs and the nursing notes.  Pertinent labs & imaging results that were available during my care of the patient were reviewed by me and considered in my medical decision making (see chart for details).     [name] is a 83 y.o. female With a past medical history significant for hypertension, hypothyroidism, and GERD who presents with a fall and left hip pain. Patient is accompanied by family reports that patient was using her walker and transferring from the walker to a chair when she slid backwards causing her to fall onto her left hip. She denies any other injuries including no headache, neck pain, or back pain. She denies numbness, tingling, weakness but reports inability to bear weight on left leg. She denies any preceding symptoms such as chest pain, palpitations, or shortness of breath. She denies any recent infectious symptoms. This sounds like a mechanical fall.  History and exam are seen above. On exam, patient has a shortened left leg. Clinically suspected femur fracture or hip fracture. Patient had normal sensation, strength, and pulses. No pelvic instability on exam.  Patient had x-ray showing hip fracture. Patient had screening and preoperative laboratory imaging and EKG testing.  Orthopedics was called. Patient admitted to hospitalist service and orthopedics will see her for further management. Patient admitted in stable  condition.    Final Clinical Impressions(s) / ED Diagnoses   Final diagnoses:  Closed fracture of left hip, initial encounter ([place])  Clinical Impression: 1. Closed fracture of left hip, initial encounter ([place])   2. Pain     Disposition: Admit to hospitalist service with orthopedics following    Courtney Paris, MD 10/17/16 1115

## 2016-10-16 NOTE — H&P (Signed)
History and Physical  Patient Name: [name]     [id number]    DOB: 1920/11/26    DOA: 10/16/2016 Referring provider: Eduard Roux, MD PCP: Wenda Low, MD   Patient coming from: Home     Chief Complaint: Hip pain and fall  HPI: [name] is a 83 y.o. female with a past medical history significant for HTN, hypothyroidism and frequent falls who presents with hip pain after a fall.  The patient was in her normal state of health until this evening when she was going to turn on the light, lost her balance, fell on her left hip and had immediate LEFT hip pain and could not walk.    ED course:  -Afebrile, heart rate 76, respirations and pulse ox normal, BP 226/85 -Na 139, K 4.0, Cr 0.98, WBC 8.3K, Hgb 13.8 -Radiograph of the left hip showed a displaced subcapital fracture of the left hip -The case was discussed with Dr. Erlinda Hong who agreed to see the patient, and TRH were asked to admit for medical management.    There was no preceding dizziness, weakness, lightheadedness or vertigo, no chest discomfort, palpitations, or dyspnea, nor are there any of those symptoms now.  The patient denies active heart issues, angina, or history of MI. She does not typically exert to an equivalent of 4 METS.  She denies history of TIAs/CVAs (takes Plavix because her PCP thought her falls may be related to basilar artery disease, family think), CAD, CHF, or DM treated with insulin. She has no history of COPD or symptoms of OSA, including snoring, daytime drowsiness, apnea.  She has no history of prolonged steroid use and does not use insulin.  She does not use alcohol, and has no history of withdrawal symptoms or delirium in the context of previous surgeries.  Anesthesia Specific concerns: Presence of loose teeth: None Anesthesia problems in past: None History of bleeding disorder: None  Review of Systems:  Review of Systems  Constitutional: Negative for chills and fever.  Respiratory:  Negative for cough and shortness of breath.   Cardiovascular: Negative for chest pain, palpitations and leg swelling.  Genitourinary: Negative for dysuria.  Musculoskeletal: Positive for falls and joint pain.  Neurological: Negative for seizures and loss of consciousness.  All other systems reviewed and are negative.         Past Medical History:  Diagnosis Date  . Anemia   . Anginal pain ([place])   . Arthritis    "all over; doesn't seem to bother me much" (08/22/2013)  . Chronic lower back pain   . Dysrhythmia    "irregular heart beat"   . Exertional shortness of breath   . GERD (gastroesophageal reflux disease)   . Hypertension   . Hypothyroidism   . Migraines    "used to"  . Mixed hyperlipidemia   . Osteoporosis   . Paroxysmal supraventricular tachycardia ([place])   . Skin cancer    "couple taken off right leg" (08/22/2013)    Past Surgical History:  Procedure Laterality Date  . ABDOMINAL HYSTERECTOMY    . APPENDECTOMY  08/22/2013  . BACK SURGERY    . CATARACT EXTRACTION W/ INTRAOCULAR LENS  IMPLANT, BILATERAL    . LAPAROSCOPIC APPENDECTOMY N/A 08/22/2013   Procedure: APPENDECTOMY LAPAROSCOPIC;  Surgeon: Imogene Burn. [name], MD;  Location: [facility];  Service: General;  Laterality: N/A;  . LUMBAR [place] SURGERY  2008  . THROAT SURGERY  1940's; ?   "cysts removed"     Social History:  Patient lives alone.  Patient walks with a walker.  She is not a smoker.  She has a daughter who lives locally and checks on her frequently.  She is independent with ADLs at baseline..    Allergies  Allergen Reactions  . Codeine Other (See Comments)    "feel like I'm floating"  . Other Other (See Comments)    All pain meds cause lethargy and disorientation    Family history: Reviewed.  No history of CAD, heart disease, lung disease, renal disease.  Prior to Admission medications   Medication Sig Start Date End Date Taking? Authorizing Provider  bimatoprost (LUMIGAN) 0.01 % SOLN Place 1 drop into  both eyes at bedtime.   Yes Historical Provider, MD  BIOTIN PO Take 1 tablet by mouth daily.   Yes Historical Provider, MD  brimonidine (ALPHAGAN P) 0.1 % SOLN Place 1 drop into the left eye 3 (three) times daily. 05/07/14  Yes Historical Provider, MD  Calcium Citrate-Vitamin D (CALCITRATE) 315-250 MG-UNIT TABS Take 2 tablets by mouth 2 (two) times daily.   Yes Historical Provider, MD  clopidogrel (PLAVIX) 75 MG tablet Take 37.5 mg by mouth every other day.   Yes Historical Provider, MD  diltiazem (DILACOR XR) 120 MG 24 hr capsule Take 120 mg by mouth every evening.    Yes Historical Provider, MD  dorzolamide-timolol (COSOPT) 22.3-6.8 MG/ML ophthalmic solution Place 1 drop into the left eye 2 (two) times daily. 05/07/14  Yes Historical Provider, MD  ferrous sulfate 325 (65 FE) MG EC tablet Take 325 mg by mouth every evening.   Yes Historical Provider, MD  fexofenadine (ALLEGRA) 180 MG tablet Take 180 mg by mouth daily.   Yes Historical Provider, MD  gemfibrozil (LOPID) 600 MG tablet Take 600 mg by mouth 2 (two) times daily before a meal.   Yes Historical Provider, MD  levothyroxine (SYNTHROID, LEVOTHROID) 50 MCG tablet Take 25-50 mcg by mouth See admin instructions. Takes 1/2 tab on even days Takes 1 tab on odd days   Yes Historical Provider, MD  lisinopril (PRINIVIL,ZESTRIL) 20 MG tablet Take 20 mg by mouth daily.   Yes Historical Provider, MD  loperamide (IMODIUM A-D) 2 MG tablet Take 2 mg by mouth every morning.   Yes Historical Provider, MD  metoprolol (LOPRESSOR) 50 MG tablet Take 50 mg by mouth 2 (two) times daily.   Yes Historical Provider, MD  Omega-3 Fatty Acids (FISH OIL) 1000 MG CAPS Take 1 capsule by mouth 2 (two) times daily.   Yes Historical Provider, MD  omeprazole (PRILOSEC) 20 MG capsule Take 20 mg by mouth daily.   Yes Historical Provider, MD  sertraline (ZOLOFT) 100 MG tablet Take 100 mg by mouth at bedtime.    Yes Historical Provider, MD  VITAMIN D, CHOLECALCIFEROL, PO Take 1  tablet by mouth every evening.   Yes Historical Provider, MD       Physical Exam: BP 183/72   Pulse 86   Temp 97.6 F (36.4 C) (Oral)   Resp 16   Ht 4\' 11"  (1.499 m)   Wt 49 kg (108 lb)   SpO2 97%   BMI 21.81 kg/m  General appearance: Well-developed, thin elderly adult female, alert and in mild distress from pain.   Eyes: Anicteric, conjunctiva pink, lids and lashes normal. PERRL.    ENT: No nasal deformity, discharge, epistaxis.  Hearing mostly normal. OP dry without lesions.  Dentition good.    Lymph: No cervical, supraclavicular or axillary lymphadenopathy. Skin: Warm and  dry.  No jaundice.  No suspicious rashes or lesions. Cardiac: RRR, nl S1-S2, no murmurs appreciated.  Capillary refill is brisk.  JVP normal.  No LE edema.  Radial and DP pulses 2+ and symmetric.  No carotid bruits. Respiratory: Normal respiratory rate and rhythm.  CTAB without rales or wheezes. GI: Abdomen soft without rigidity.  No TTP. No ascites, distension, no hepatosplenomegaly.  MSK: Left leg foreshortened and externally rotated.  No effusions.  No clubbing or cyanosis. Neuro: Cranial nerves normal.  Sensorium intact and responding to questions, attention normal.  Speech is fluent.  Moves upper extremities equally and with normal coordination, leg testing deferred.    Psych: The patient is oriented to time, place and person. Behavior appropriate.  Affect normal.  Recall, recent and remote, as well as general fund of knowledge seem within normal limits. No evidence of aural or visual hallucinations or delusions.     Labs on Admission:  I have personally reviewed following labs and imaging studies: CBC:  Recent Labs Lab 10/16/16 2021  WBC 8.3  NEUTROABS 6.7  HGB 13.8  HCT 40.6  MCV 94.6  PLT 0000000   Basic Metabolic Panel:  Recent Labs Lab 10/16/16 2021  NA 139  K 4.0  CL 109  CO2 22  GLUCOSE 115*  BUN 25*  CREATININE 0.98  CALCIUM 10.1   GFR: Estimated Creatinine Clearance: 24.5  mL/min (by C-G formula based on SCr of 0.98 mg/dL).  Liver Function Tests: No results for input(s): AST, ALT, ALKPHOS, BILITOT, PROT, ALBUMIN in the last 168 hours. No results for input(s): LIPASE, AMYLASE in the last 168 hours. No results for input(s): AMMONIA in the last 168 hours. Coagulation Profile:  Recent Labs Lab 10/16/16 2021  INR 1.13   Cardiac Enzymes: No results for input(s): CKTOTAL, CKMB, CKMBINDEX, TROPONINI in the last 168 hours. BNP (last 3 results) No results for input(s): PROBNP in the last 8760 hours. HbA1C: No results for input(s): HGBA1C in the last 72 hours. CBG: No results for input(s): GLUCAP in the last 168 hours. Lipid Profile: No results for input(s): CHOL, HDL, LDLCALC, TRIG, CHOLHDL, LDLDIRECT in the last 72 hours. Thyroid Function Tests: No results for input(s): TSH, T4TOTAL, FREET4, T3FREE, THYROIDAB in the last 72 hours. Anemia Panel: No results for input(s): VITAMINB12, FOLATE, FERRITIN, TIBC, IRON, RETICCTPCT in the last 72 hours.    Radiological Exams on Admission: Personally reviewed CXR shows no focal opacity, no edema; ortho radiograph reports reviewed: Dg Chest 1 View  Result Date: 10/16/2016 CLINICAL DATA:  Acute onset of left femur pain after fall. Concern for chest injury. Initial encounter. EXAM: CHEST 1 VIEW COMPARISON:  Chest radiograph performed 06/03/2007 FINDINGS: The lungs are well-aerated. Vascular congestion is noted. Peribronchial thickening is seen. There is no evidence of pleural effusion or pneumothorax. The cardiomediastinal silhouette is borderline normal in size. No acute osseous abnormalities are seen. IMPRESSION: Vascular congestion noted. Peribronchial thickening seen. No displaced rib fractures identified. Electronically Signed   By: Garald Balding M.D.   On: 10/16/2016 21:15   Dg Pelvis 1-2 Views  Result Date: 10/16/2016 CLINICAL DATA:  Acute onset left femur pain after falling. Initial encounter. EXAM: PELVIS - 1-2  VIEW COMPARISON:  Left hip radiographs from 05/05/2015 FINDINGS: There is a displaced subcapital fracture of the left femoral neck, with superior displacement of the distal femur. There is mild superior joint space narrowing at the left hip. Mild degenerative change is noted at the lower lumbar spine. The sacroiliac joints are  unremarkable in appearance. The visualized bowel gas pattern is grossly unremarkable in appearance. IMPRESSION: Displaced subcapital fracture of the left femoral neck, with superior displacement of the distal femur. Electronically Signed   By: Garald Balding M.D.   On: 10/16/2016 21:31   Dg Femur Min 2 Views Left  Result Date: 10/16/2016 CLINICAL DATA:  Status post fall, with proximal left femur pain. Initial encounter. EXAM: LEFT FEMUR 2 VIEWS COMPARISON:  Left hip radiographs performed 05/05/2015 FINDINGS: There is a displaced subcapital fracture through the left femoral neck, with superior displacement of the distal femur. The left femoral head remains seated at the acetabulum. The left femur is otherwise intact. Soft tissue swelling is noted overlying the left hip. The knee joint is grossly unremarkable, though not well assessed. No knee joint effusion is seen. Mild vascular calcifications are seen. IMPRESSION: 1. Displaced subcapital fracture through the left femoral neck, with superior displacement of the distal femur. 2. Mild vascular calcifications seen. Electronically Signed   By: Garald Balding M.D.   On: 10/16/2016 21:16    EKG: Independently reviewed. Rate 77, QTc 436, normal sinus rhythm.    Assessment and Plan: 1. Hip fracture: The patient will be seen by Dr. Erlinda Hong in the morning at [facility], to evaluate for operative fixation of the LEFT hip.   -Admit to med-surg bed -Hydrocodone-acetaminophen or morphine as tolerated for pain -Bed rest, apply ice, document sedation and vitals per Hip fracture protocol -NPO at midnight -MIVF -Nutrition consulted -Hold following  meds:  -ACEi and Plavix tomorrow, restart after surgery   Overall, the patient is at low risk for the planned surgery.  Patient has a RCRI score of 0 and Gupta score 0.5%. The patient has no active cardiac symptoms, and althought she has a functional capacity <4 METs, pre-operative testing is unlikely to change management. -No further testing needed    2. Hypertension: -Continue amlodipine, diltiazem, metoprolol -Restart lisinopril post-operatively  3. Peripheral vascular disease: -Continue Plavix post-op  4. Hypothyroidism: -Continue levothyroxine  5. Other medications: -Continue sertraline -Continue eye drops -Continue gemfibrozil -Continue PPI -Continue iron         DVT prophylaxis: SCDs  Diet: NPO Code Status: DO NOT RESUSCITATE  Family Communication: Daughter at bedside  Disposition Plan: Anticipate evaluation by Orthopedics and surgical fixation tomorrow, then PT evaluation and discharge to SNF in 2-3 days. Admission status: INPATIENT for hip fracture, medical surgical bed     Medical decision making and consults: Patient seen at 11:25 PM on 10/16/2016.  The patient was discussed with Dr. Sherry Ruffing. What exists of the patient's chart was reviewed in depth and summarized above.  Clinical condition: stable.      Edwin Dada [facility] Pager [phone]

## 2016-10-16 NOTE — ED Notes (Addendum)
Pt placed on 2L [place]. Patient transported to X-ray

## 2016-10-16 NOTE — ED Triage Notes (Signed)
Pt was in her home going from her den to her kitchen and fell backwards and landed on her bottom.Pt Stated her head hit the cabinet. Pt did not have LOC. Pt stated that her left hip was hurting and not her head at all. No neck or no back pain. Per ems 215/98. HR 62 RR 16 92%

## 2016-10-17 LAB — BASIC METABOLIC PANEL
ANION GAP: 10 (ref 5–15)
BUN: 26 mg/dL — ABNORMAL HIGH (ref 6–20)
CHLORIDE: 108 mmol/L (ref 101–111)
CO2: 22 mmol/L (ref 22–32)
CREATININE: 0.95 mg/dL (ref 0.44–1.00)
Calcium: 9.5 mg/dL (ref 8.9–10.3)
GFR calc non Af Amer: 50 mL/min — ABNORMAL LOW (ref >60)
GFR, EST AFRICAN AMERICAN: 58 mL/min — AB (ref >60)
Glucose, Bld: 117 mg/dL — ABNORMAL HIGH (ref 65–99)
Potassium: 3.9 mmol/L (ref 3.5–5.1)
SODIUM: 140 mmol/L (ref 135–145)

## 2016-10-17 LAB — CBC
HCT: 35.8 % — ABNORMAL LOW (ref 36.0–46.0)
HEMOGLOBIN: 12.1 g/dL (ref 12.0–15.0)
MCH: 31.8 pg (ref 26.0–34.0)
MCHC: 33.8 g/dL (ref 30.0–36.0)
MCV: 94 fL (ref 78.0–100.0)
Platelets: 168 10*3/uL (ref 150–400)
RBC: 3.81 MIL/uL — ABNORMAL LOW (ref 3.87–5.11)
RDW: 13.6 % (ref 11.5–15.5)
WBC: 9.2 10*3/uL (ref 4.0–10.5)

## 2016-10-17 LAB — TYPE AND SCREEN
ABO/RH(D): A POS
ANTIBODY SCREEN: NEGATIVE

## 2016-10-17 LAB — SURGICAL PCR SCREEN
MRSA, PCR: NEGATIVE
Staphylococcus aureus: NEGATIVE

## 2016-10-17 MED ORDER — DORZOLAMIDE HCL 2 % OP SOLN
1.0000 [drp] | Freq: Two times a day (BID) | OPHTHALMIC | Status: DC
  Administered 2016-10-17 – 2016-10-20 (×8): 1 [drp] via OPHTHALMIC
  Filled 2016-10-17: qty 10

## 2016-10-17 MED ORDER — TIMOLOL MALEATE 0.5 % OP SOLN
1.0000 [drp] | Freq: Two times a day (BID) | OPHTHALMIC | Status: DC
  Administered 2016-10-17 – 2016-10-20 (×8): 1 [drp] via OPHTHALMIC
  Filled 2016-10-17 (×2): qty 5

## 2016-10-17 MED ORDER — FERROUS SULFATE 325 (65 FE) MG PO TABS
325.0000 mg | ORAL_TABLET | Freq: Every evening | ORAL | Status: DC
  Administered 2016-10-17 – 2016-10-19 (×2): 325 mg via ORAL
  Filled 2016-10-17 (×2): qty 1

## 2016-10-17 MED ORDER — LEVOTHYROXINE SODIUM 25 MCG PO TABS
25.0000 ug | ORAL_TABLET | ORAL | Status: DC
Start: 2016-10-18 — End: 2016-10-20
  Administered 2016-10-20: 25 ug via ORAL
  Filled 2016-10-17 (×4): qty 1

## 2016-10-17 MED ORDER — POLYETHYLENE GLYCOL 3350 17 G PO PACK: 17.0000 g | PACK | Freq: Every day | ORAL | Status: DC | PRN

## 2016-10-17 MED ORDER — LATANOPROST 0.005 % OP SOLN
1.0000 [drp] | Freq: Every day | OPHTHALMIC | Status: DC
  Administered 2016-10-17: 1 [drp] via OPHTHALMIC
  Filled 2016-10-17: qty 2.5

## 2016-10-17 MED ORDER — LEVOTHYROXINE SODIUM 25 MCG PO TABS: 25.0000 ug | ORAL_TABLET | ORAL | Status: DC

## 2016-10-17 MED ORDER — LATANOPROST 0.005 % OP SOLN
1.0000 [drp] | Freq: Every day | OPHTHALMIC | Status: DC
  Administered 2016-10-17 – 2016-10-19 (×3): 1 [drp] via OPHTHALMIC
  Filled 2016-10-17: qty 2.5

## 2016-10-17 MED ORDER — BISACODYL 10 MG RE SUPP: 10.0000 mg | Freq: Every day | RECTAL | Status: DC | PRN

## 2016-10-17 MED ORDER — BRIMONIDINE TARTRATE 0.15 % OP SOLN
1.0000 [drp] | Freq: Three times a day (TID) | OPHTHALMIC | Status: DC
  Administered 2016-10-17 – 2016-10-18 (×4): 1 [drp] via OPHTHALMIC
  Filled 2016-10-17: qty 5

## 2016-10-17 MED ORDER — SERTRALINE HCL 100 MG PO TABS
100.0000 mg | ORAL_TABLET | Freq: Every day | ORAL | Status: DC
  Administered 2016-10-17 – 2016-10-19 (×4): 100 mg via ORAL
  Filled 2016-10-17 (×4): qty 1

## 2016-10-17 MED ORDER — PANTOPRAZOLE SODIUM 40 MG PO TBEC
40.0000 mg | DELAYED_RELEASE_TABLET | Freq: Every day | ORAL | Status: DC
  Administered 2016-10-17 – 2016-10-20 (×3): 40 mg via ORAL
  Filled 2016-10-17 (×3): qty 1

## 2016-10-17 MED ORDER — LEVOTHYROXINE SODIUM 50 MCG PO TABS
50.0000 ug | ORAL_TABLET | ORAL | Status: DC
  Administered 2016-10-17 – 2016-10-19 (×2): 50 ug via ORAL
  Filled 2016-10-17 (×4): qty 1

## 2016-10-17 MED ORDER — ENSURE ENLIVE PO LIQD
237.0000 mL | Freq: Two times a day (BID) | ORAL | Status: DC
  Administered 2016-10-17 – 2016-10-20 (×5): 237 mL via ORAL

## 2016-10-17 MED ORDER — CEFAZOLIN SODIUM-DEXTROSE 2-4 GM/100ML-% IV SOLN
2.0000 g | INTRAVENOUS | Status: AC
  Administered 2016-10-18: 2 g via INTRAVENOUS
  Filled 2016-10-17 (×2): qty 100

## 2016-10-17 MED ORDER — ENOXAPARIN SODIUM 30 MG/0.3ML ~~LOC~~ SOLN
30.0000 mg | SUBCUTANEOUS | Status: AC
  Administered 2016-10-17: 30 mg via SUBCUTANEOUS
  Filled 2016-10-17: qty 0.3

## 2016-10-17 MED ORDER — CLOPIDOGREL BISULFATE 75 MG PO TABS: 37.5000 mg | ORAL_TABLET | ORAL | Status: DC

## 2016-10-17 MED ORDER — DORZOLAMIDE HCL-TIMOLOL MAL 2-0.5 % OP SOLN: 1.0000 [drp] | Freq: Two times a day (BID) | OPHTHALMIC | Status: DC

## 2016-10-17 MED ORDER — CEFAZOLIN SODIUM-DEXTROSE 2-4 GM/100ML-% IV SOLN: 2.0000 g | Freq: Once | INTRAVENOUS | Status: DC

## 2016-10-17 MED ORDER — GEMFIBROZIL 600 MG PO TABS
600.0000 mg | ORAL_TABLET | Freq: Two times a day (BID) | ORAL | Status: DC
  Administered 2016-10-17 – 2016-10-20 (×5): 600 mg via ORAL
  Filled 2016-10-17 (×8): qty 1

## 2016-10-17 MED ORDER — LISINOPRIL 20 MG PO TABS: 20.0000 mg | ORAL_TABLET | Freq: Every day | ORAL | Status: DC

## 2016-10-17 MED ORDER — POVIDONE-IODINE 10 % EX SWAB
2.0000 "application " | Freq: Once | CUTANEOUS | Status: AC
  Administered 2016-10-18: 2 via TOPICAL

## 2016-10-17 MED ORDER — DOCUSATE SODIUM 100 MG PO CAPS
100.0000 mg | ORAL_CAPSULE | Freq: Two times a day (BID) | ORAL | Status: DC
  Administered 2016-10-17 (×3): 100 mg via ORAL
  Filled 2016-10-17 (×3): qty 1

## 2016-10-17 NOTE — Clinical Social Work Placement (Signed)
   CLINICAL SOCIAL WORK PLACEMENT  NOTE  Date:  10/17/2016  Patient Details  Name: SYMPHANIE SMALLRIDGE MRN: [id number] Date of Birth: 27-Jun-1921  Clinical Social Work is seeking post-discharge placement for this patient at the [place] level of care (*CSW will initial, date and re-position this form in  chart as items are completed):  Yes   Patient/family provided with [place] Work Department's list of facilities offering this level of care within the geographic area requested by the patient (or if unable, by the patient's family).  Yes   Patient/family informed of their freedom to choose among providers that offer the needed level of care, that participate in Medicare, Medicaid or managed care program needed by the patient, have an available bed and are willing to accept the patient.  Yes   Patient/family informed of [facility]'s ownership interest in [facility] and [facility], as well as of the fact that they are under no obligation to receive care at these facilities.  PASRR submitted to EDS on 10/17/16     PASRR number received on 10/17/16     Existing PASRR number confirmed on       FL2 transmitted to all facilities in geographic area requested by pt/family on       FL2 transmitted to all facilities within larger geographic area on       Patient informed that his/her managed care company has contracts with or will negotiate with certain facilities, including the following:            Patient/family informed of bed offers received.  Patient chooses bed at       Physician recommends and patient chooses bed at      Patient to be transferred to   on  .  Patient to be transferred to facility by       Patient family notified on   of transfer.  Name of family member notified:        PHYSICIAN       Additional Comment:    _______________________________________________ Truitt Merle, [place] 10/17/2016, 3:34 PM

## 2016-10-17 NOTE — Care Management Note (Addendum)
Case Management Note  Patient Details  Name: [name] MRN: [id number] Date of Birth: Apr 15, 1921  Subjective/Objective:                    Action/Plan:  Fell at home, lives alone, son lives next door, uses walker plan 2-28  partial hip replacement  Will await post op PT recommendations Expected Discharge Date:                  Expected Discharge Plan:     In-House Referral:     Discharge planning Services  CM Consult  Post Acute Care Choice:  Durable Medical Equipment, Home Health Choice offered to:     DME Arranged:    DME Agency:     HH Arranged:    [place] Agency:     Status of Service:  In process, will continue to follow  If discussed at Long Length of Stay Meetings, dates discussed:    Additional Comments:  Marilu Favre, RN 10/17/2016, 1:56 PM

## 2016-10-17 NOTE — Clinical Social Work Note (Signed)
Clinical Social Work Assessment  Patient Details  Name: [name] MRN: [id number] Date of Birth: 1921/03/09  Date of referral:  10/17/16               Reason for consult:  Discharge Planning                Permission sought to share information with:  Family Supports, Customer service manager Permission granted to share information::  Yes, Verbal Permission Granted  Name::     [name]  Agency::  SNFs  Relationship::  Daughter  Contact Information:  [phone]  Housing/Transportation Living arrangements for the past 2 months:  Single Family Home Source of Information:  Patient, Adult Children Patient Interpreter Needed:  None Criminal Activity/Legal Involvement Pertinent to Current Situation/Hospitalization:  No - Comment as needed Significant Relationships:  Adult Children, Friend, Other Family Members Lives with:  Self Do you feel safe going back to the place where you live?  Yes Need for family participation in patient care:  Yes (Comment)  Care giving concerns:  No caregiving concerns identified.    Social Worker assessment / plan:  CSW met pt with daughter-[name], granddaughter-[name], and friend at bedside to discuss consult for SNF. CSW introduced herself and explained role of social work. P/T has not yet evaluated. CSW explained discharging to SNF with Medicare. Pt prefers [facility]. Pt lives home alone. However, daughter-[name] (POA) lives next door. CSW provided SNF listing for review. Pt agreeable to SNF for STR.   CSW will send FL-[facility] to [facility] and [facility] and follow up on potential bed offer. CSW will continue to follow.   Employment status:  Retired Forensic scientist:  Medicare PT Recommendations:  Not assessed at this time [place] / Referral to community resources:  [place]  Patient/Family's Response to care:  Pt and family appreciative of CSW support.   Patient/Family's Understanding of and Emotional  Response to Diagnosis, Current Treatment, and Prognosis:  Pt and family understand the benefit of short term rehab before returning home.   Emotional Assessment Appearance:  Appears stated age Attitude/Demeanor/Rapport:   (Appropriate) Affect (typically observed):  Accepting, Adaptable, Pleasant Orientation:  Oriented to Self, Oriented to Place, Oriented to  Time, Oriented to Situation Alcohol / Substance use:  Other Psych involvement (Current and /or in the community):  No (Comment)  Discharge Needs  Concerns to be addressed:  Care Coordination Readmission within the last 30 days:    Current discharge risk:  Dependent with Mobility Barriers to Discharge:  Continued Medical Work up   [employer], LCSW 10/17/2016, 2:59 PM

## 2016-10-17 NOTE — Progress Notes (Signed)
PROGRESS NOTE    [name]  [id number] DOB: 03-Jan-1921 DOA: 10/16/2016 PCP: Wenda Low, MD  Brief Narrative: [name] is a 83 y.o. female with a past medical history significant for HTN, hypothyroidism and frequent falls who presented with hip pain after a fall. Xray with subcapital fracture of the left hip, Ortho consulting, OR pending  Assessment & Plan:  1. Hip fracture: -patient is at low cardiac risk for Hip surgery -plan for OR tomorrow -continue metoprolol -needs DVT proph post op -will need SNF for ST rehab  2. Hypertension: -Continue amlodipine, diltiazem, metoprolol -Hold lisinopril for now  3. Peripheral vascular disease: -hold plavix, will likely discharge off on Lovenox for 4weeks and can resume plavix after that  4. Hypothyroidism: -Continue levothyroxine  5. Depression -Continue sertraline  DVT prophylaxis: Lovenox x1 today Code Status: DO NOT RESUSCITATE  Family Communication: Daughter at bedside  Disposition Plan: SNF in 2-3days  Consultants:   Ortho Dr.Xu   Subjective: Feels ok, as long as not moving  Objective: Vitals:   10/16/16 2230 10/16/16 2300 10/17/16 0025 10/17/16 0300  BP: 175/65 183/72 (!) 205/81 (!) 122/51  Pulse: 77 86 80 74  Resp: 18 16 19 18   Temp:   97.5 F (36.4 C)   TempSrc:   Oral   SpO2: 95% 97% 98% 97%  Weight:      Height:        Intake/Output Summary (Last 24 hours) at 10/17/16 1031 Last data filed at 10/17/16 0500  Gross per 24 hour  Intake           438.33 ml  Output              250 ml  Net           188.33 ml   Filed Weights   10/16/16 1938  Weight: 49 kg (108 lb)    Examination:  General exam: Appears calm and comfortable, no distress, pleasant Respiratory system: Clear to auscultation. Respiratory effort normal. Cardiovascular system: S1 & S2 heard, RRR. No JVD Gastrointestinal system: Abdomen is nondistended, soft and nontender. Normal bowel sounds heard. Central  nervous system: Alert and oriented. No focal neurological deficits. Extremities: Left leg foreshortened and externally rotated.  Skin: No rashes, lesions or ulcers Psychiatry: Judgement and insight appear normal. Mood & affect appropriate.     Data Reviewed: I have personally reviewed following labs and imaging studies  CBC:  Recent Labs Lab 10/16/16 2021 10/17/16 0314  WBC 8.3 9.2  NEUTROABS 6.7  --   HGB 13.8 12.1  HCT 40.6 35.8*  MCV 94.6 94.0  PLT 175 [id number]   Basic Metabolic Panel:  Recent Labs Lab 10/16/16 2021 10/17/16 0314  NA 139 140  K 4.0 3.9  CL 109 108  CO2 22 22  GLUCOSE 115* 117*  BUN 25* 26*  CREATININE 0.98 0.95  CALCIUM 10.1 9.5   GFR: Estimated Creatinine Clearance: 25.2 mL/min (by C-G formula based on SCr of 0.95 mg/dL). Liver Function Tests: No results for input(s): AST, ALT, ALKPHOS, BILITOT, PROT, ALBUMIN in the last 168 hours. No results for input(s): LIPASE, AMYLASE in the last 168 hours. No results for input(s): AMMONIA in the last 168 hours. Coagulation Profile:  Recent Labs Lab 10/16/16 2021  INR 1.13   Cardiac Enzymes: No results for input(s): CKTOTAL, CKMB, CKMBINDEX, TROPONINI in the last 168 hours. BNP (last 3 results) No results for input(s): PROBNP in the last 8760 hours. HbA1C: No results for input(s): HGBA1C  in the last 72 hours. CBG: No results for input(s): GLUCAP in the last 168 hours. Lipid Profile: No results for input(s): CHOL, HDL, LDLCALC, TRIG, CHOLHDL, LDLDIRECT in the last 72 hours. Thyroid Function Tests: No results for input(s): TSH, T4TOTAL, FREET4, T3FREE, THYROIDAB in the last 72 hours. Anemia Panel: No results for input(s): VITAMINB12, FOLATE, FERRITIN, TIBC, IRON, RETICCTPCT in the last 72 hours. Urine analysis:    Component Value Date/Time   COLORURINE YELLOW 08/22/2013 0825   APPEARANCEUR CLEAR 08/22/2013 0825   LABSPEC 1.015 08/22/2013 0825   PHURINE 5.0 08/22/2013 0825   GLUCOSEU NEGATIVE  08/22/2013 0825   HGBUR NEGATIVE 08/22/2013 0825   BILIRUBINUR NEGATIVE 08/22/2013 0825   KETONESUR NEGATIVE 08/22/2013 0825   PROTEINUR NEGATIVE 08/22/2013 0825   UROBILINOGEN 0.2 08/22/2013 0825   NITRITE NEGATIVE 08/22/2013 0825   LEUKOCYTESUR TRACE (A) 08/22/2013 0825   Sepsis Labs: @LABRCNTIP (procalcitonin:4,lacticidven:4)  )No results found for this or any previous visit (from the past 240 hour(s)).       Radiology Studies: Dg Chest 1 View  Result Date: 10/16/2016 CLINICAL DATA:  Acute onset of left femur pain after fall. Concern for chest injury. Initial encounter. EXAM: CHEST 1 VIEW COMPARISON:  Chest radiograph performed 06/03/2007 FINDINGS: The lungs are well-aerated. Vascular congestion is noted. Peribronchial thickening is seen. There is no evidence of pleural effusion or pneumothorax. The cardiomediastinal silhouette is borderline normal in size. No acute osseous abnormalities are seen. IMPRESSION: Vascular congestion noted. Peribronchial thickening seen. No displaced rib fractures identified. Electronically Signed   By: Garald Balding M.D.   On: 10/16/2016 21:15   Dg Pelvis 1-2 Views  Result Date: 10/16/2016 CLINICAL DATA:  Acute onset left femur pain after falling. Initial encounter. EXAM: PELVIS - 1-2 VIEW COMPARISON:  Left hip radiographs from 05/05/2015 FINDINGS: There is a displaced subcapital fracture of the left femoral neck, with superior displacement of the distal femur. There is mild superior joint space narrowing at the left hip. Mild degenerative change is noted at the lower lumbar spine. The sacroiliac joints are unremarkable in appearance. The visualized bowel gas pattern is grossly unremarkable in appearance. IMPRESSION: Displaced subcapital fracture of the left femoral neck, with superior displacement of the distal femur. Electronically Signed   By: Garald Balding M.D.   On: 10/16/2016 21:31   Dg Femur Min 2 Views Left  Result Date: 10/16/2016 CLINICAL DATA:   Status post fall, with proximal left femur pain. Initial encounter. EXAM: LEFT FEMUR 2 VIEWS COMPARISON:  Left hip radiographs performed 05/05/2015 FINDINGS: There is a displaced subcapital fracture through the left femoral neck, with superior displacement of the distal femur. The left femoral head remains seated at the acetabulum. The left femur is otherwise intact. Soft tissue swelling is noted overlying the left hip. The knee joint is grossly unremarkable, though not well assessed. No knee joint effusion is seen. Mild vascular calcifications are seen. IMPRESSION: 1. Displaced subcapital fracture through the left femoral neck, with superior displacement of the distal femur. 2. Mild vascular calcifications seen. Electronically Signed   By: Garald Balding M.D.   On: 10/16/2016 21:16        Scheduled Meds: . brimonidine  1 drop Both Eyes TID  . diltiazem  120 mg Oral QPM  . docusate sodium  100 mg Oral BID  . dorzolamide  1 drop Left Eye BID   And  . timolol  1 drop Left Eye BID  . ferrous sulfate  325 mg Oral QPM  . gemfibrozil  600 mg Oral BID AC  . latanoprost  1 drop Both Eyes QHS  . [START ON 10/18/2016] levothyroxine  25 mcg Oral Q48H  . levothyroxine  50 mcg Oral Q48H  . [START ON 10/18/2016] lisinopril  20 mg Oral Daily  . metoprolol  50 mg Oral BID  . pantoprazole  40 mg Oral Daily  . sertraline  100 mg Oral QHS   Continuous Infusions: . sodium chloride 100 mL/hr at 10/17/16 1024     LOS: 1 day    Time spent: 26min    Domenic Polite, MD [facility] Pager [phone]  If 7PM-7AM, please contact night-coverage [email or web address] Password [facility] 10/17/2016, 10:31 AM

## 2016-10-17 NOTE — Progress Notes (Signed)
Initial Nutrition Assessment  INTERVENTION:  Ensure Enlive po BID, each supplement provides 350 kcal and 20 grams of protein  NUTRITION DIAGNOSIS:   Increased nutrient needs related to wound healing as evidenced by estimated needs.  GOAL:   Patient will meet greater than or equal to 90% of their needs  MONITOR:   PO intake, Supplement acceptance  REASON FOR ASSESSMENT:   Consult Hip fracture protocol  ASSESSMENT:   Pt with PMH of HTN, hypothyroidism, and frequent falls admitted after fall with displaced subcapital fracture of the left hip.    Pt, caregiver, and granddaughter give hx. Pt does have hx of weight loss but this happened in 2015. Pt has been current weight for > 1 year. Has good appetite. She also drinks 1 Boost at night.  Pt willing to try Ensure while here. Plans for surgery tomorrow.  Meal Completion: 75%   Diet Order:  Diet regular Room service appropriate? Yes; Fluid consistency: Thin  Skin:  Reviewed, no issues  Last BM:  2/26  Height:   Ht Readings from Last 1 Encounters:  10/16/16 4\' 11"  (1.499 m)    Weight:   Wt Readings from Last 1 Encounters:  10/16/16 108 lb (49 kg)    Ideal Body Weight:  44.6 kg  BMI:  Body mass index is 21.81 kg/m.  Estimated Nutritional Needs:   Kcal:  1200-1400  Protein:  55-65 grams  Fluid:  > 1.5 L/day  EDUCATION NEEDS:   No education needs identified at this time  [place], [place], [place] Pager [phone] After Hours Pager

## 2016-10-17 NOTE — Progress Notes (Signed)
Received patient from ED accompanied by daughter and son-in-law.  Admission made aware. Patient AOx4, pain at 10/10 at left hip, VS stable except BP elevated at 205/81. Aministered PRN medications Vicodin 2 tablets for pain and scheduled lopressor 50 mg tab as ordered.  Patient resting on bed comfortably with daughter at bedside.  Will monitor.

## 2016-10-17 NOTE — Consult Note (Signed)
ORTHOPAEDIC CONSULTATION  REQUESTING PHYSICIAN: Domenic Polite, MD  Chief Complaint: Left femoral neck hip fracture  HPI: [name] is a 83 y.o. female who presents with left hip fracture s/p mechanical fall PTA.  The patient endorses severe pain in the left hip, that does not radiate, grinding in quality, worse with any movement, better with immobilization.  Denies LOC/fever/chills/nausea/vomiting.  Walks with walker.  Does live independently but son lives next door.  Denies LOC, neck pain, abd pain.  Past Medical History:  Diagnosis Date  . Anemia   . Anginal pain ([place])   . Arthritis    "all over; doesn't seem to bother me much" (08/22/2013)  . Chronic lower back pain   . Dysrhythmia    "irregular heart beat"   . Exertional shortness of breath   . GERD (gastroesophageal reflux disease)   . Hypertension   . Hypothyroidism   . Migraines    "used to"  . Mixed hyperlipidemia   . Osteoporosis   . Paroxysmal supraventricular tachycardia ([place])   . Skin cancer    "couple taken off right leg" (08/22/2013)   Past Surgical History:  Procedure Laterality Date  . ABDOMINAL HYSTERECTOMY    . APPENDECTOMY  08/22/2013  . BACK SURGERY    . CATARACT EXTRACTION W/ INTRAOCULAR LENS  IMPLANT, BILATERAL    . LAPAROSCOPIC APPENDECTOMY N/A 08/22/2013   Procedure: APPENDECTOMY LAPAROSCOPIC;  Surgeon: Imogene Burn. [name], MD;  Location: [facility];  Service: General;  Laterality: N/A;  . LUMBAR [place] SURGERY  2008  . THROAT SURGERY  1940's; ?   "cysts removed"    Social History   Social History  . Marital status: Widowed    Spouse name: N/A  . Number of children: N/A  . Years of education: N/A   Social History Main Topics  . Smoking status: Never Smoker  . Smokeless tobacco: Never Used  . Alcohol use No  . Drug use: No  . Sexual activity: No   Other Topics Concern  . None   Social History Narrative  . None   History reviewed. No pertinent family history. Allergies  Allergen  Reactions  . Codeine Other (See Comments)    "feel like I'm floating"  . Other Other (See Comments)    All pain meds cause lethargy and disorientation   Prior to Admission medications   Medication Sig Start Date End Date Taking? Authorizing Provider  bimatoprost (LUMIGAN) 0.01 % SOLN Place 1 drop into both eyes at bedtime.   Yes Historical Provider, MD  BIOTIN PO Take 1 tablet by mouth daily.   Yes Historical Provider, MD  brimonidine (ALPHAGAN P) 0.1 % SOLN Place 1 drop into the left eye 3 (three) times daily. 05/07/14  Yes Historical Provider, MD  Calcium Citrate-Vitamin D (CALCITRATE) 315-250 MG-UNIT TABS Take 2 tablets by mouth 2 (two) times daily.   Yes Historical Provider, MD  clopidogrel (PLAVIX) 75 MG tablet Take 37.5 mg by mouth every other day.   Yes Historical Provider, MD  diltiazem (DILACOR XR) 120 MG 24 hr capsule Take 120 mg by mouth every evening.    Yes Historical Provider, MD  dorzolamide-timolol (COSOPT) 22.3-6.8 MG/ML ophthalmic solution Place 1 drop into the left eye 2 (two) times daily. 05/07/14  Yes Historical Provider, MD  ferrous sulfate 325 (65 FE) MG EC tablet Take 325 mg by mouth every evening.   Yes Historical Provider, MD  fexofenadine (ALLEGRA) 180 MG tablet Take 180 mg by mouth daily.  Yes Historical Provider, MD  gemfibrozil (LOPID) 600 MG tablet Take 600 mg by mouth 2 (two) times daily before a meal.   Yes Historical Provider, MD  levothyroxine (SYNTHROID, LEVOTHROID) 50 MCG tablet Take 25-50 mcg by mouth See admin instructions. Takes 1/2 tab on even days Takes 1 tab on odd days   Yes Historical Provider, MD  lisinopril (PRINIVIL,ZESTRIL) 20 MG tablet Take 20 mg by mouth daily.   Yes Historical Provider, MD  loperamide (IMODIUM A-D) 2 MG tablet Take 2 mg by mouth every morning.   Yes Historical Provider, MD  metoprolol (LOPRESSOR) 50 MG tablet Take 50 mg by mouth 2 (two) times daily.   Yes Historical Provider, MD  Omega-3 Fatty Acids (FISH OIL) 1000 MG CAPS  Take 1 capsule by mouth 2 (two) times daily.   Yes Historical Provider, MD  omeprazole (PRILOSEC) 20 MG capsule Take 20 mg by mouth daily.   Yes Historical Provider, MD  sertraline (ZOLOFT) 100 MG tablet Take 100 mg by mouth at bedtime.    Yes Historical Provider, MD  VITAMIN D, CHOLECALCIFEROL, PO Take 1 tablet by mouth every evening.   Yes Historical Provider, MD   Dg Chest 1 View  Result Date: 10/16/2016 CLINICAL DATA:  Acute onset of left femur pain after fall. Concern for chest injury. Initial encounter. EXAM: CHEST 1 VIEW COMPARISON:  Chest radiograph performed 06/03/2007 FINDINGS: The lungs are well-aerated. Vascular congestion is noted. Peribronchial thickening is seen. There is no evidence of pleural effusion or pneumothorax. The cardiomediastinal silhouette is borderline normal in size. No acute osseous abnormalities are seen. IMPRESSION: Vascular congestion noted. Peribronchial thickening seen. No displaced rib fractures identified. Electronically Signed   By: Garald Balding M.D.   On: 10/16/2016 21:15   Dg Pelvis 1-2 Views  Result Date: 10/16/2016 CLINICAL DATA:  Acute onset left femur pain after falling. Initial encounter. EXAM: PELVIS - 1-2 VIEW COMPARISON:  Left hip radiographs from 05/05/2015 FINDINGS: There is a displaced subcapital fracture of the left femoral neck, with superior displacement of the distal femur. There is mild superior joint space narrowing at the left hip. Mild degenerative change is noted at the lower lumbar spine. The sacroiliac joints are unremarkable in appearance. The visualized bowel gas pattern is grossly unremarkable in appearance. IMPRESSION: Displaced subcapital fracture of the left femoral neck, with superior displacement of the distal femur. Electronically Signed   By: Garald Balding M.D.   On: 10/16/2016 21:31   Dg Femur Min 2 Views Left  Result Date: 10/16/2016 CLINICAL DATA:  Status post fall, with proximal left femur pain. Initial encounter. EXAM:  LEFT FEMUR 2 VIEWS COMPARISON:  Left hip radiographs performed 05/05/2015 FINDINGS: There is a displaced subcapital fracture through the left femoral neck, with superior displacement of the distal femur. The left femoral head remains seated at the acetabulum. The left femur is otherwise intact. Soft tissue swelling is noted overlying the left hip. The knee joint is grossly unremarkable, though not well assessed. No knee joint effusion is seen. Mild vascular calcifications are seen. IMPRESSION: 1. Displaced subcapital fracture through the left femoral neck, with superior displacement of the distal femur. 2. Mild vascular calcifications seen. Electronically Signed   By: Garald Balding M.D.   On: 10/16/2016 21:16    All pertinent xrays, MRI, CT independently reviewed and interpreted  Positive ROS: All other systems have been reviewed and were otherwise negative with the exception of those mentioned in the HPI and as above.  Physical Exam: General:  Alert, no acute distress Cardiovascular: No pedal edema Respiratory: No cyanosis, no use of accessory musculature GI: No organomegaly, abdomen is soft and non-tender Skin: No lesions in the area of chief complaint Neurologic: Sensation intact distally Psychiatric: Patient is competent for consent with normal mood and affect Lymphatic: No axillary or cervical lymphadenopathy  MUSCULOSKELETAL:  - pain with movement of the hip and extremity - skin intact - NVI distally - compartments soft  Assessment: Left femoral neck hip fracture  Plan: - partial hip replacement is recommended, patient and family are aware of r/b/a and wish to proceed - consent obtained - medical optimization per primary team - surgery is planned for wed.  May eat today - Based on history and fracture pattern this likely represents a fragility fracture. - Fragility fractures affect up to one half of women and one third of men after age 33 years and occur in the setting of bone  disorder such as osteoporosis or osteopenia and warrant appropriate work-up. - The following are general recommendations that may serve as an outline for an appropriate work-up:  1.) Obtain bone density measurement to confirm presumptive diagnosis, assess severity of osteoporosis and risk of future fracture, and use as baseline for monitoring treatment  2.) Obtain laboratory tests: CBC, ESR, serum calcium, creatinine, albumin,phosphate, alkaline phosphatase, liver transaminases, protein electrophoresis, urinalysis, 25-hydroxyvitamin D.  3.) Exclude secondary causes of low bone mass and skeletal fragility (eg,multiple myeloma, lymphoma) as indicated.  4.) Obtain radiograph of thoracic and lumbar spine, particularly among individuals with back pain or height loss to assess presence of vertebral fractures  5.) Intermittent administration of recombinant human parathyroid hormone  6.) Optimize nutritional status using nutritional supplementation.  7.) Patient/family education to prevent future falls.  8.) Early mobilization and exercise program - exercise decreases the rate of bone loss and has been associated with decreased rate of fragility fractures   Thank you for the consult and the opportunity to see Ms. Guidry  N. Eduard Roux, MD [place] 11:08 AM

## 2016-10-18 ENCOUNTER — Inpatient Hospital Stay (HOSPITAL_COMMUNITY): Payer: Medicare Other | Admitting: Anesthesiology

## 2016-10-18 ENCOUNTER — Encounter (HOSPITAL_COMMUNITY)
Admission: EM | Disposition: A | Discharge status: Home / Self Care | Payer: Self-pay | Source: Home / Self Care | Attending: Internal Medicine

## 2016-10-18 ENCOUNTER — Inpatient Hospital Stay (HOSPITAL_COMMUNITY): Payer: Medicare Other

## 2016-10-18 DIAGNOSIS — I10 Essential (primary) hypertension: Secondary | ICD-10-CM

## 2016-10-18 DIAGNOSIS — S72012A Unspecified intracapsular fracture of left femur, initial encounter for closed fracture: Secondary | ICD-10-CM

## 2016-10-18 DIAGNOSIS — I739 Peripheral vascular disease, unspecified: Secondary | ICD-10-CM

## 2016-10-18 DIAGNOSIS — E039 Hypothyroidism, unspecified: Secondary | ICD-10-CM

## 2016-10-18 HISTORY — PX: ANTERIOR APPROACH HEMI HIP ARTHROPLASTY: SHX6690

## 2016-10-18 LAB — CBC
HCT: 35.8 % — ABNORMAL LOW (ref 36.0–46.0)
Hemoglobin: 11.5 g/dL — ABNORMAL LOW (ref 12.0–15.0)
MCH: 30.8 pg (ref 26.0–34.0)
MCHC: 32.1 g/dL (ref 30.0–36.0)
MCV: 96 fL (ref 78.0–100.0)
Platelets: 126 10*3/uL — ABNORMAL LOW (ref 150–400)
RBC: 3.73 MIL/uL — AB (ref 3.87–5.11)
RDW: 13.4 % (ref 11.5–15.5)
WBC: 7 10*3/uL (ref 4.0–10.5)

## 2016-10-18 LAB — BASIC METABOLIC PANEL
Anion gap: 7 (ref 5–15)
BUN: 36 mg/dL — AB (ref 6–20)
CHLORIDE: 109 mmol/L (ref 101–111)
CO2: 22 mmol/L (ref 22–32)
CREATININE: 1.29 mg/dL — AB (ref 0.44–1.00)
Calcium: 9.2 mg/dL (ref 8.9–10.3)
GFR calc Af Amer: 40 mL/min — ABNORMAL LOW (ref >60)
GFR calc non Af Amer: 35 mL/min — ABNORMAL LOW (ref >60)
GLUCOSE: 107 mg/dL — AB (ref 65–99)
Potassium: 4.2 mmol/L (ref 3.5–5.1)
SODIUM: 138 mmol/L (ref 135–145)

## 2016-10-18 SURGERY — HEMIARTHROPLASTY, HIP, DIRECT ANTERIOR APPROACH, FOR FRACTURE
Anesthesia: General | Laterality: Left

## 2016-10-18 MED ORDER — LIDOCAINE 2% (20 MG/ML) 5 ML SYRINGE
INTRAMUSCULAR | Status: AC
  Filled 2016-10-18: qty 5

## 2016-10-18 MED ORDER — CEFAZOLIN SODIUM-DEXTROSE 2-4 GM/100ML-% IV SOLN
INTRAVENOUS | Status: AC
  Filled 2016-10-18: qty 100

## 2016-10-18 MED ORDER — BRIMONIDINE TARTRATE 0.15 % OP SOLN: 1.0000 [drp] | Freq: Three times a day (TID) | OPHTHALMIC | Status: DC

## 2016-10-18 MED ORDER — ASPIRIN EC 325 MG PO TBEC: 325.0000 mg | DELAYED_RELEASE_TABLET | Freq: Two times a day (BID) | ORAL | 0 refills | Status: DC

## 2016-10-18 MED ORDER — OXYCODONE HCL 5 MG PO TABS
5.0000 mg | ORAL_TABLET | ORAL | Status: DC | PRN
  Administered 2016-10-18: 5 mg via ORAL
  Administered 2016-10-19: 10 mg via ORAL
  Filled 2016-10-18: qty 1
  Filled 2016-10-18: qty 2

## 2016-10-18 MED ORDER — PHENOL 1.4 % MT LIQD: 1.0000 | OROMUCOSAL | Status: DC | PRN

## 2016-10-18 MED ORDER — ROCURONIUM BROMIDE 100 MG/10ML IV SOLN
INTRAVENOUS | Status: DC | PRN
  Administered 2016-10-18: 30 mg via INTRAVENOUS

## 2016-10-18 MED ORDER — SENNOSIDES-DOCUSATE SODIUM 8.6-50 MG PO TABS
1.0000 | ORAL_TABLET | Freq: Two times a day (BID) | ORAL | Status: DC
Start: 2016-10-18 — End: 2016-10-20
  Administered 2016-10-18 – 2016-10-20 (×4): 1 via ORAL
  Filled 2016-10-18 (×4): qty 1

## 2016-10-18 MED ORDER — FENTANYL CITRATE (PF) 100 MCG/2ML IJ SOLN
INTRAMUSCULAR | Status: AC
  Filled 2016-10-18: qty 2

## 2016-10-18 MED ORDER — ASPIRIN EC 325 MG PO TBEC
325.0000 mg | DELAYED_RELEASE_TABLET | Freq: Every day | ORAL | Status: DC
  Administered 2016-10-19 – 2016-10-20 (×2): 325 mg via ORAL
  Filled 2016-10-18 (×2): qty 1

## 2016-10-18 MED ORDER — NEOSTIGMINE METHYLSULFATE 10 MG/10ML IV SOLN
INTRAVENOUS | Status: DC | PRN
  Administered 2016-10-18: 3 mg via INTRAVENOUS

## 2016-10-18 MED ORDER — ACETAMINOPHEN 650 MG RE SUPP: 650.0000 mg | Freq: Four times a day (QID) | RECTAL | Status: DC | PRN

## 2016-10-18 MED ORDER — CALCIUM CITRATE-VITAMIN D 315-250 MG-UNIT PO TABS: 2.0000 | ORAL_TABLET | Freq: Two times a day (BID) | ORAL | Status: DC

## 2016-10-18 MED ORDER — METOCLOPRAMIDE HCL 5 MG/ML IJ SOLN: 5.0000 mg | Freq: Three times a day (TID) | INTRAMUSCULAR | Status: DC | PRN

## 2016-10-18 MED ORDER — ONDANSETRON HCL 4 MG/2ML IJ SOLN: 4.0000 mg | Freq: Four times a day (QID) | INTRAMUSCULAR | Status: DC | PRN

## 2016-10-18 MED ORDER — SODIUM CHLORIDE 0.9 % IV SOLN
INTRAVENOUS | Status: DC
  Administered 2016-10-18: 07:00:00 via INTRAVENOUS

## 2016-10-18 MED ORDER — SODIUM CHLORIDE 0.9 % IV SOLN
INTRAVENOUS | Status: DC
  Administered 2016-10-19 (×2): via INTRAVENOUS

## 2016-10-18 MED ORDER — TRANEXAMIC ACID 1000 MG/10ML IV SOLN
1000.0000 mg | INTRAVENOUS | Status: AC
  Administered 2016-10-18: 1000 mg via INTRAVENOUS
  Filled 2016-10-18: qty 10

## 2016-10-18 MED ORDER — METHOCARBAMOL 1000 MG/10ML IJ SOLN
500.0000 mg | Freq: Four times a day (QID) | INTRAMUSCULAR | Status: DC | PRN
  Filled 2016-10-18: qty 5

## 2016-10-18 MED ORDER — GLYCOPYRROLATE 0.2 MG/ML IJ SOLN
INTRAMUSCULAR | Status: DC | PRN
  Administered 2016-10-18: 0.4 mg via INTRAVENOUS

## 2016-10-18 MED ORDER — PROPOFOL 10 MG/ML IV BOLUS
INTRAVENOUS | Status: DC | PRN
  Administered 2016-10-18: 70 mg via INTRAVENOUS

## 2016-10-18 MED ORDER — CEFAZOLIN SODIUM-DEXTROSE 2-4 GM/100ML-% IV SOLN
2.0000 g | Freq: Four times a day (QID) | INTRAVENOUS | Status: AC
  Administered 2016-10-18 – 2016-10-19 (×3): 2 g via INTRAVENOUS
  Filled 2016-10-18 (×3): qty 100

## 2016-10-18 MED ORDER — TRANEXAMIC ACID 1000 MG/10ML IV SOLN
INTRAVENOUS | Status: DC | PRN
  Administered 2016-10-18: 2000 mg via TOPICAL

## 2016-10-18 MED ORDER — CALCIUM CARBONATE 1250 (500 CA) MG PO TABS
1000.0000 mg | ORAL_TABLET | Freq: Two times a day (BID) | ORAL | Status: DC
  Administered 2016-10-18 – 2016-10-20 (×4): 1000 mg via ORAL
  Filled 2016-10-18 (×4): qty 1

## 2016-10-18 MED ORDER — HYDROCODONE-ACETAMINOPHEN 5-325 MG PO TABS
1.0000 | ORAL_TABLET | Freq: Four times a day (QID) | ORAL | Status: DC | PRN
  Administered 2016-10-19: 2 via ORAL
  Filled 2016-10-18: qty 2

## 2016-10-18 MED ORDER — SODIUM CHLORIDE 0.9 % IV SOLN
2000.0000 mg | INTRAVENOUS | Status: DC
  Filled 2016-10-18: qty 20

## 2016-10-18 MED ORDER — ONDANSETRON HCL 4 MG/2ML IJ SOLN
INTRAMUSCULAR | Status: DC | PRN
  Administered 2016-10-18: 4 mg via INTRAVENOUS

## 2016-10-18 MED ORDER — LACTATED RINGERS IV SOLN
INTRAVENOUS | Status: DC
  Administered 2016-10-18 (×2): via INTRAVENOUS

## 2016-10-18 MED ORDER — FENTANYL CITRATE (PF) 100 MCG/2ML IJ SOLN
INTRAMUSCULAR | Status: DC | PRN
  Administered 2016-10-18 (×4): 50 ug via INTRAVENOUS

## 2016-10-18 MED ORDER — ONDANSETRON HCL 4 MG PO TABS: 4.0000 mg | ORAL_TABLET | Freq: Four times a day (QID) | ORAL | Status: DC | PRN

## 2016-10-18 MED ORDER — EPHEDRINE SULFATE 50 MG/ML IJ SOLN
INTRAMUSCULAR | Status: DC | PRN
  Administered 2016-10-18 (×3): 10 mg via INTRAVENOUS
  Administered 2016-10-18: 15 mg via INTRAVENOUS

## 2016-10-18 MED ORDER — POLYETHYLENE GLYCOL 3350 17 G PO PACK
17.0000 g | PACK | Freq: Every day | ORAL | Status: DC
  Administered 2016-10-19 – 2016-10-20 (×2): 17 g via ORAL
  Filled 2016-10-18 (×2): qty 1

## 2016-10-18 MED ORDER — OXYCODONE-ACETAMINOPHEN 5-325 MG PO TABS: 1.0000 | ORAL_TABLET | ORAL | 0 refills | Status: DC | PRN

## 2016-10-18 MED ORDER — FENTANYL CITRATE (PF) 100 MCG/2ML IJ SOLN: 25.0000 ug | INTRAMUSCULAR | Status: DC | PRN

## 2016-10-18 MED ORDER — LOPERAMIDE HCL 2 MG PO TABS: 2.0000 mg | ORAL_TABLET | ORAL | Status: DC

## 2016-10-18 MED ORDER — EPHEDRINE 5 MG/ML INJ
INTRAVENOUS | Status: AC
  Filled 2016-10-18: qty 10

## 2016-10-18 MED ORDER — NEOSTIGMINE METHYLSULFATE 5 MG/5ML IV SOSY
PREFILLED_SYRINGE | INTRAVENOUS | Status: AC
  Filled 2016-10-18: qty 5

## 2016-10-18 MED ORDER — METOCLOPRAMIDE HCL 5 MG PO TABS: 5.0000 mg | ORAL_TABLET | Freq: Three times a day (TID) | ORAL | Status: DC | PRN

## 2016-10-18 MED ORDER — BRIMONIDINE TARTRATE 0.15 % OP SOLN
1.0000 [drp] | Freq: Three times a day (TID) | OPHTHALMIC | Status: DC
  Administered 2016-10-18 – 2016-10-20 (×6): 1 [drp] via OPHTHALMIC

## 2016-10-18 MED ORDER — MORPHINE SULFATE (PF) 2 MG/ML IV SOLN: 0.5000 mg | INTRAVENOUS | Status: DC | PRN

## 2016-10-18 MED ORDER — ACETAMINOPHEN 325 MG PO TABS: 650.0000 mg | ORAL_TABLET | Freq: Four times a day (QID) | ORAL | Status: DC | PRN

## 2016-10-18 MED ORDER — MENTHOL 3 MG MT LOZG: 1.0000 | LOZENGE | OROMUCOSAL | Status: DC | PRN

## 2016-10-18 MED ORDER — METHOCARBAMOL 500 MG PO TABS
500.0000 mg | ORAL_TABLET | Freq: Four times a day (QID) | ORAL | Status: DC | PRN
  Administered 2016-10-19: 500 mg via ORAL
  Filled 2016-10-18: qty 1

## 2016-10-18 MED ORDER — DEXAMETHASONE SODIUM PHOSPHATE 10 MG/ML IJ SOLN
INTRAMUSCULAR | Status: DC | PRN
  Administered 2016-10-18: 10 mg via INTRAVENOUS

## 2016-10-18 MED ORDER — ALUM & MAG HYDROXIDE-SIMETH 200-200-20 MG/5ML PO SUSP: 30.0000 mL | ORAL | Status: DC | PRN

## 2016-10-18 MED ORDER — ROCURONIUM BROMIDE 50 MG/5ML IV SOSY
PREFILLED_SYRINGE | INTRAVENOUS | Status: AC
  Filled 2016-10-18: qty 5

## 2016-10-18 MED ORDER — 0.9 % SODIUM CHLORIDE (POUR BTL) OPTIME
TOPICAL | Status: DC | PRN
  Administered 2016-10-18: 1000 mL

## 2016-10-18 MED ORDER — SODIUM CHLORIDE 0.9 % IR SOLN
Status: DC | PRN
  Administered 2016-10-18: 3000 mL

## 2016-10-18 MED ORDER — LOPERAMIDE HCL 2 MG PO CAPS
2.0000 mg | ORAL_CAPSULE | Freq: Every day | ORAL | Status: DC
  Administered 2016-10-19: 2 mg via ORAL
  Filled 2016-10-18 (×2): qty 1

## 2016-10-18 SURGICAL SUPPLY — 48 items
BAG DECANTER FOR FLEXI CONT (MISCELLANEOUS) ×2 IMPLANT
CAPT HIP HEMI 2 ×2 IMPLANT
CELLS DAT CNTRL 66122 CELL SVR (MISCELLANEOUS) IMPLANT
COVER SURGICAL LIGHT HANDLE (MISCELLANEOUS) ×2 IMPLANT
DRAPE C-ARM 42X72 X-RAY (DRAPES) ×2 IMPLANT
DRAPE STERI IOBAN 125X83 (DRAPES) ×2 IMPLANT
DRAPE U-SHAPE 47X51 STRL (DRAPES) ×4 IMPLANT
DRSG AQUACEL AG ADV 3.5X10 (GAUZE/BANDAGES/DRESSINGS) ×2 IMPLANT
DURAPREP 26ML APPLICATOR (WOUND CARE) ×2 IMPLANT
ELECT BLADE 4.0 EZ CLEAN MEGAD (MISCELLANEOUS) ×2
ELECT REM PT RETURN 9FT ADLT (ELECTROSURGICAL) ×2
ELECTRODE BLDE 4.0 EZ CLN MEGD (MISCELLANEOUS) ×1 IMPLANT
ELECTRODE REM PT RTRN 9FT ADLT (ELECTROSURGICAL) ×1 IMPLANT
GLOVE SKINSENSE NS SZ7.5 (GLOVE) ×1
GLOVE SKINSENSE STRL SZ7.5 (GLOVE) ×1 IMPLANT
GLOVE SURG SYN 7.5  E (GLOVE) ×2
GLOVE SURG SYN 7.5 E (GLOVE) ×2 IMPLANT
GOWN SRG XL XLNG 56XLVL 4 (GOWN DISPOSABLE) ×1 IMPLANT
GOWN STRL NON-REIN XL XLG LVL4 (GOWN DISPOSABLE) ×1
GOWN STRL REUS W/ TWL LRG LVL3 (GOWN DISPOSABLE) IMPLANT
GOWN STRL REUS W/TWL LRG LVL3 (GOWN DISPOSABLE)
HANDPIECE INTERPULSE COAX TIP (DISPOSABLE) ×1
HOOD PEEL AWAY FLYTE STAYCOOL (MISCELLANEOUS) ×4 IMPLANT
IV NS 1000ML (IV SOLUTION) ×1
IV NS 1000ML BAXH (IV SOLUTION) ×1 IMPLANT
IV NS IRRIG 3000ML ARTHROMATIC (IV SOLUTION) ×2 IMPLANT
KIT BASIN OR (CUSTOM PROCEDURE TRAY) ×2 IMPLANT
MARKER SKIN DUAL TIP RULER LAB (MISCELLANEOUS) ×2 IMPLANT
NEEDLE SPNL 18GX3.5 QUINCKE PK (NEEDLE) ×2 IMPLANT
PACK TOTAL JOINT (CUSTOM PROCEDURE TRAY) ×2 IMPLANT
PACK UNIVERSAL I (CUSTOM PROCEDURE TRAY) ×2 IMPLANT
RTRCTR WOUND ALEXIS 18CM MED (MISCELLANEOUS)
RTRCTR WOUND ALEXIS 18CM SML (INSTRUMENTS) ×2
SAVER CELL AAL HAEMONETICS (INSTRUMENTS) ×1 IMPLANT
SAW OSC TIP CART 19.5X105X1.3 (SAW) ×2 IMPLANT
SEALER BIPOLAR AQUA 6.0 (INSTRUMENTS) ×2 IMPLANT
SET HNDPC FAN SPRY TIP SCT (DISPOSABLE) ×1 IMPLANT
STAPLER VISISTAT 35W (STAPLE) IMPLANT
SUT ETHIBOND 2 V 37 (SUTURE) ×2 IMPLANT
SUT VIC AB 1 CT1 27 (SUTURE) ×1
SUT VIC AB 1 CT1 27XBRD ANBCTR (SUTURE) ×1 IMPLANT
SUT VIC AB 2-0 CT1 27 (SUTURE) ×1
SUT VIC AB 2-0 CT1 TAPERPNT 27 (SUTURE) ×1 IMPLANT
SYR 20CC LL (SYRINGE) ×2 IMPLANT
SYR 50ML LL SCALE MARK (SYRINGE) ×2 IMPLANT
TOWEL OR 17X26 10 PK STRL BLUE (TOWEL DISPOSABLE) ×2 IMPLANT
TRAY CATH 16FR W/PLASTIC CATH (SET/KITS/TRAYS/PACK) ×2 IMPLANT
YANKAUER SUCT BULB TIP NO VENT (SUCTIONS) ×2 IMPLANT

## 2016-10-18 NOTE — Consult Note (Signed)
[facility] CM Primary Care Navigator  10/18/2016  [name] 1921-06-30 [id number]   Met with patient and family at the bedside to identify possible discharge needs. Patient reports that she had a fall and had been hurting to left hip ever since which had led to this admission.  Patient endorses Dr. Lyman Speller with [facility] as her primary care provider.    Patient shared using [facility] in [place] to obtain medications without any problem.   Patient's daughter [name]) manages her medications at home using "pill box" system weekly.   Daughter (lives next door) and provides transportation to her doctors' appointments.  Patient lives on her own but has a caregiver [name]) 5 days a week and daughter is her primary caregiver at home.   Discharge plan is skilled nursing facility ([facility]) for short term rehabilitation per daughter.  Patient and daughter voiced understanding to call primary care provider's office when she returns home, for a post discharge follow-up appointment within a week or sooner if needs arise. Patient letter (with PCP's contact number) provided as their reminder.  Daughter and patient denied any further needs or concerns at this time.  For additional questions please contact:  Edwena Felty A. Lenora Gomes, BSN, RN-BC [facility] PRIMARY CARE Navigator Cell: [phone]

## 2016-10-18 NOTE — Anesthesia Procedure Notes (Signed)
Procedure Name: Intubation Date/Time: 10/18/2016 3:12 PM Performed by: Lance Coon Pre-anesthesia Checklist: Patient identified, Emergency Drugs available, Patient being monitored, Timeout performed and Suction available Patient Re-evaluated:Patient Re-evaluated prior to inductionOxygen Delivery Method: Circle system utilized Preoxygenation: Pre-oxygenation with 100% oxygen Intubation Type: IV induction Ventilation: Mask ventilation without difficulty Laryngoscope Size: Miller and 3 Grade View: Grade III Tube type: Oral Tube size: 7.5 mm Number of attempts: 1 Airway Equipment and Method: Bougie stylet Placement Confirmation: ETT inserted through vocal cords under direct vision,  positive ETCO2 and breath sounds checked- equal and bilateral Secured at: 19 cm Tube secured with: Tape Dental Injury: Teeth and Oropharynx as per pre-operative assessment

## 2016-10-18 NOTE — Discharge Instructions (Signed)
° ° °  1. Change dressings as needed °2. May shower but keep incisions covered and dry °3. Take aspirin and plavix to prevent blood clots °4. Take stool softeners as needed °5. Take pain meds as needed ° °

## 2016-10-18 NOTE — Progress Notes (Signed)
[facility]                                                                              Patient Demographics  [name], is a 83 y.o. female, DOB - 1920-09-03, [id number]  Admit date - 10/16/2016   Admitting Physician Edwin Dada, MD  Outpatient Primary MD for the patient is Wenda Low, MD  Outpatient specialists:   LOS - 2  days    Chief Complaint  Patient presents with  . Fall       Brief summary   Marissa Mcmurdie Blackardis a 82 y.o.femalewith a past medical history significant for HTN, hypothyroidism and frequent fallswho presented with hip pain after a fall. Xray with subcapital fracture of the left hip, Ortho consulting  Assessment & Plan    Principal Problem:   Closed subcapital fracture of femur, left, (HCC) - Continue metoprolol - Plan for or today, orthopedics following - Pain control, DVT prophylaxis per orthopedics  Active Problems: Hypertension: -Continue amlodipine, diltiazem, metoprolol - Hold lisinopril for now  Peripheral vascular disease: -hold plavix, will likely discharge off on Lovenox for 4weeks and can resume plavix after that. However, will follow DVT prophylaxis recommendations by orthopedics at discharge  Hypothyroidism: -Continue levothyroxine  Depression -Continue sertraline   Code Status: DNR DVT Prophylaxis:  Lovenox  Family Communication: Discussed in detail with the patient, all imaging results, lab results explained to the patient, daughter at bedside   Disposition Plan: Will need a skilled nursing facility  Time Spent in minutes   25 minutes  Procedures:    Consultants:   Orthopedics  Antimicrobials:      Medications  Scheduled Meds: . [MAR Hold] brimonidine  1 drop Left Eye TID  .  ceFAZolin (ANCEF) IV  2 g Intravenous To SS-Surg  . [MAR Hold] diltiazem  120 mg Oral QPM  . [MAR Hold] dorzolamide  1 drop Left Eye BID   And  . [MAR Hold] timolol  1 drop Left Eye  BID  . [MAR Hold] feeding supplement (ENSURE ENLIVE)  237 mL Oral BID BM  . [MAR Hold] ferrous sulfate  325 mg Oral QPM  . [MAR Hold] gemfibrozil  600 mg Oral BID AC  . [MAR Hold] latanoprost  1 drop Both Eyes QHS  . [MAR Hold] levothyroxine  25 mcg Oral Q48H  . [MAR Hold] levothyroxine  50 mcg Oral Q48H  . [MAR Hold] metoprolol  50 mg Oral BID  . [MAR Hold] pantoprazole  40 mg Oral Daily  . [MAR Hold] polyethylene glycol  17 g Oral Daily  . [MAR Hold] senna-docusate  1 tablet Oral BID  . [MAR Hold] sertraline  100 mg Oral QHS   Continuous Infusions: . sodium chloride 50 mL/hr at 10/18/16 0713  . lactated ringers 50 mL/hr at 10/18/16 1317   PRN Meds:.[MAR Hold] bisacodyl, [MAR Hold] HYDROcodone-acetaminophen, [MAR Hold]  morphine injection   Antibiotics   Anti-infectives    Start     Dose/Rate Route Frequency Ordered Stop   10/18/16 0800  ceFAZolin (ANCEF) IVPB 2g/100 mL premix     2 g 200 mL/hr over 30 Minutes Intravenous To  [facility] 10/17/16 2124 10/19/16 0800   10/17/16 2130  ceFAZolin (ANCEF) IVPB 2g/100 mL premix  Status:  Discontinued    Comments:  Anesthesia to give preop   2 g 200 mL/hr over 30 Minutes Intravenous  Once 10/17/16 2124 10/17/16 2154        Subjective:   [name] was seen and examined today.Complaining of pain in the left hip.  Patient denies dizziness, chest pain, shortness of breath, abdominal pain, N/V/D/C, new weakness, numbess, tingling. No acute events overnight.    Objective:   Vitals:   10/18/16 0158 10/18/16 0500 10/18/16 0700 10/18/16 0957  BP: (!) 160/60 (!) 183/66 (!) 150/58 (!) 130/54  Pulse: 66 67 77 77  Resp: 19 18  18   Temp: 98.4 F (36.9 C) 97.7 F (36.5 C)  98.6 F (37 C)  TempSrc: Oral Oral  Oral  SpO2: 96% 97% 97% 96%  Weight:      Height:        Intake/Output Summary (Last 24 hours) at 10/18/16 1420 Last data filed at 10/18/16 1103  Gross per 24 hour  Intake              320 ml  Output               900 ml  Net             -580 ml     Wt Readings from Last 3 Encounters:  10/16/16 49 kg (108 lb)  09/12/13 51.9 kg (114 lb 6.4 oz)  08/22/13 62 kg (136 lb 11 oz)     Exam  General: Alert and oriented x 3, NAD  HEENT:   Neck: Supple, no JVD, no masses  Cardiovascular: S1 S2 auscultated, no rubs, murmurs or gallops. Regular rate and rhythm.  Respiratory: Clear to auscultation bilaterally, no wheezing, rales or rhonchi  Gastrointestinal: Soft, nontender, nondistended, + bowel sounds  Ext: no cyanosis clubbing or edema  Neuro: No new deficits  Skin: No rashes  Psych: Normal affect and demeanor, alert and oriented x3    Data Reviewed:  I have personally reviewed following labs and imaging studies  Micro Results Recent Results (from the past 240 hour(s))  Surgical pcr screen     Status: None   Collection Time: 10/17/16  1:27 PM  Result Value Ref Range Status   MRSA, PCR NEGATIVE NEGATIVE Final   Staphylococcus aureus NEGATIVE NEGATIVE Final    Comment:        The Xpert SA Assay (FDA approved for NASAL specimens in patients over 19 years of age), is one component of a comprehensive surveillance program.  Test performance has been validated by [facility] for patients greater than or equal to 32 year old. It is not intended to diagnose infection nor to guide or monitor treatment.     Radiology Reports Dg Chest 1 View  Result Date: 10/16/2016 CLINICAL DATA:  Acute onset of left femur pain after fall. Concern for chest injury. Initial encounter. EXAM: CHEST 1 VIEW COMPARISON:  Chest radiograph performed 06/03/2007 FINDINGS: The lungs are well-aerated. Vascular congestion is noted. Peribronchial thickening is seen. There is no evidence of pleural effusion or pneumothorax. The cardiomediastinal silhouette is borderline normal in size. No acute osseous abnormalities are seen. IMPRESSION: Vascular congestion noted. Peribronchial thickening seen. No displaced rib  fractures identified. Electronically Signed   By: Garald Balding M.D.   On: 10/16/2016 21:15   Dg Pelvis 1-2 Views  Result Date: 10/16/2016 CLINICAL DATA:  Acute onset left femur pain after falling. Initial encounter. EXAM: PELVIS - 1-2 VIEW COMPARISON:  Left hip radiographs from 05/05/2015 FINDINGS: There is a displaced subcapital fracture of the left femoral neck, with superior displacement of the distal femur. There is mild superior joint space narrowing at the left hip. Mild degenerative change is noted at the lower lumbar spine. The sacroiliac joints are unremarkable in appearance. The visualized bowel gas pattern is grossly unremarkable in appearance. IMPRESSION: Displaced subcapital fracture of the left femoral neck, with superior displacement of the distal femur. Electronically Signed   By: Garald Balding M.D.   On: 10/16/2016 21:31   Dg Femur Min 2 Views Left  Result Date: 10/16/2016 CLINICAL DATA:  Status post fall, with proximal left femur pain. Initial encounter. EXAM: LEFT FEMUR 2 VIEWS COMPARISON:  Left hip radiographs performed 05/05/2015 FINDINGS: There is a displaced subcapital fracture through the left femoral neck, with superior displacement of the distal femur. The left femoral head remains seated at the acetabulum. The left femur is otherwise intact. Soft tissue swelling is noted overlying the left hip. The knee joint is grossly unremarkable, though not well assessed. No knee joint effusion is seen. Mild vascular calcifications are seen. IMPRESSION: 1. Displaced subcapital fracture through the left femoral neck, with superior displacement of the distal femur. 2. Mild vascular calcifications seen. Electronically Signed   By: Garald Balding M.D.   On: 10/16/2016 21:16    Lab Data:  CBC:  Recent Labs Lab 10/16/16 2021 10/17/16 0314 10/18/16 0432  WBC 8.3 9.2 7.0  NEUTROABS 6.7  --   --   HGB 13.8 12.1 11.5*  HCT 40.6 35.8* 35.8*  MCV 94.6 94.0 96.0  PLT 175 168 126*    Basic Metabolic Panel:  Recent Labs Lab 10/16/16 2021 10/17/16 0314 10/18/16 0432  NA 139 140 138  K 4.0 3.9 4.2  CL 109 108 109  CO2 22 22 22   GLUCOSE 115* 117* 107*  BUN 25* 26* 36*  CREATININE 0.98 0.95 1.29*  CALCIUM 10.1 9.5 9.2   GFR: Estimated Creatinine Clearance: 18.6 mL/min (by C-G formula based on SCr of 1.29 mg/dL (H)). Liver Function Tests: No results for input(s): AST, ALT, ALKPHOS, BILITOT, PROT, ALBUMIN in the last 168 hours. No results for input(s): LIPASE, AMYLASE in the last 168 hours. No results for input(s): AMMONIA in the last 168 hours. Coagulation Profile:  Recent Labs Lab 10/16/16 2021  INR 1.13   Cardiac Enzymes: No results for input(s): CKTOTAL, CKMB, CKMBINDEX, TROPONINI in the last 168 hours. BNP (last 3 results) No results for input(s): PROBNP in the last 8760 hours. HbA1C: No results for input(s): HGBA1C in the last 72 hours. CBG: No results for input(s): GLUCAP in the last 168 hours. Lipid Profile: No results for input(s): CHOL, HDL, LDLCALC, TRIG, CHOLHDL, LDLDIRECT in the last 72 hours. Thyroid Function Tests: No results for input(s): TSH, T4TOTAL, FREET4, T3FREE, THYROIDAB in the last 72 hours. Anemia Panel: No results for input(s): VITAMINB12, FOLATE, FERRITIN, TIBC, IRON, RETICCTPCT in the last 72 hours. Urine analysis:    Component Value Date/Time   COLORURINE YELLOW 08/22/2013 0825   APPEARANCEUR CLEAR 08/22/2013 0825   LABSPEC 1.015 08/22/2013 0825   PHURINE 5.0 08/22/2013 0825   GLUCOSEU NEGATIVE 08/22/2013 0825   HGBUR NEGATIVE 08/22/2013 0825   BILIRUBINUR NEGATIVE 08/22/2013 0825   KETONESUR NEGATIVE 08/22/2013 0825   PROTEINUR NEGATIVE 08/22/2013 0825   UROBILINOGEN 0.2 08/22/2013 0825   NITRITE NEGATIVE 08/22/2013 0825  LEUKOCYTESUR TRACE (A) 08/22/2013 [id number]     Shawnell Dykes M.D. [facility] 10/18/2016, 2:20 PM  Pager: [phone] Between 7am to 7pm - call Pager - [phone]  After 7pm go to  [email or web address] - password [email or web address]  Call night coverage person covering after 7pm

## 2016-10-18 NOTE — Transfer of Care (Signed)
Immediate Anesthesia Transfer of Care Note  Patient: [name]  Procedure(s) Performed: Procedure(s): ANTERIOR APPROACH LEFT HEMI HIP ARTHROPLASTY (Left)  Patient Location: PACU  Anesthesia Type:General  Level of Consciousness: awake, alert , oriented and patient cooperative  Airway & Oxygen Therapy: Patient Spontanous Breathing and Patient connected to nasal cannula oxygen  Post-op Assessment: Report given to RN and Post -op Vital signs reviewed and stable  Post vital signs: Reviewed and stable  Last Vitals:  Vitals:   10/18/16 0700 10/18/16 0957  BP: (!) 150/58 (!) 130/54  Pulse: 77 77  Resp:  18  Temp:  37 C    Last Pain:  Vitals:   10/18/16 1036  TempSrc:   PainSc: 3       Patients Stated Pain Goal: 3 ([id number])  Complications: No apparent anesthesia complications

## 2016-10-18 NOTE — Op Note (Addendum)
ANTERIOR APPROACH LEFT HEMI HIP ARTHROPLASTY  Procedure Note [name]   [id number]  Pre-op Diagnosis: left hip fracture     Post-op Diagnosis: same   Operative Procedures  1. Prosthetic replacement for femoral neck fracture. CPT [phone]  Personnel  Surgeon(s): Leandrew Koyanagi, MD   Anesthesia: general  Prosthesis: Depuy Femur: Corail KA 12 Head: 42 mm size: +1.5 Bearing Type: Bipolar  Date of Service: 10/16/2016 - 10/18/2016  Hip Hemiarthroplasty (Anterior Approach) Op Note:  After informed consent was obtained and the operative extremity marked in the holding area, the patient was brought back to the operating room and placed supine on the HANA table. Next, the operative extremity was prepped and draped in normal sterile fashion. Surgical timeout occurred verifying patient identification, surgical site, surgical procedure and administration of antibiotics.  A modified anterior Smith-Peterson approach to the hip was performed, using the interval between tensor fascia lata and sartorius.  Dissection was carried bluntly down onto the anterior hip capsule. The lateral femoral circumflex vessels were identified and coagulated. A capsulotomy was performed and the capsular flaps tagged for later repair.  Fluoroscopy was utilized to prepare for the femoral neck cut. The neck osteotomy was performed. The femoral head was removed and found a 42 mm head was the appropriate fit.    We then turned our attention to the femur.  After placing the femoral hook, the leg was taken to externally rotated, extended and adducted position taking care to perform soft tissue releases to allow for adequate mobilization of the femur. Soft tissue was cleared from the shoulder of the greater trochanter and the hook elevator used to improve exposure of the proximal femur. Sequential broaching performed up to a size 12. Trial neck and head were placed. The leg was brought back up to neutral and the construct  reduced. The position and sizing of components, offset and leg lengths were checked using fluoroscopy. Stability of the construct was checked in extension and external rotation without any subluxation or impingement of prosthesis. In order to make her hip stable, I had to lengthen her leg.  We dislocated the prosthesis, dropped the leg back into position, removed trial components, and irrigated copiously. The final stem and head was then placed, the leg brought back up, the system reduced and fluoroscopy used to verify positioning.  We irrigated, obtained hemostasis and closed the capsule using #2 ethibond suture.  The fascia was closed with #1 vicryl plus, the deep fat layer was closed with 0 vicryl, the subcutaneous layers closed with 2.0 Vicryl Plus and the skin closed with staples. A sterile dressing was applied. The patient was awakened in the operating room and taken to recovery in stable condition. All sponge, needle, and instrument counts were correct at the end of the case.   Position: supine  Complications: none.  Time Out: performed   Drains/Packing: none  Estimated blood loss: 100 cc  Returned to Recovery Room: in good condition.   Antibiotics: yes   Mechanical VTE (DVT) Prophylaxis: sequential compression devices, TED thigh-high  Chemical VTE (DVT) Prophylaxis: aspirin and plavix  Fluid Replacement: Crystalloid: see anesthesia record  Specimens Removed: 1 to pathology   Sponge and Instrument Count Correct? yes   PACU: portable radiograph - low AP   Admission: inpatient status, start PT & OT POD#1  Plan/RTC: Return in 2 weeks for staple removal. Return in 6 weeks to see MD.  Weight Bearing/Load Lower Extremity: full  Hip precautions: none Suture Removal: 10-14  days  Betadine to incision twice daily once dressing is removed on POD#7  N. Eduard Roux, MD [place] 4:42 PM     Implant Name Type Inv. Item Serial No. Manufacturer Lot No. LRB No.  Used  HIP BALL ARTICU DEPUY - [id number] Hips HIP BALL ARTICU DEPUY  DEPUY SYNTHES [id number] Left 1  STEM CORAIL KA12 - [id number] Stem STEM CORAIL KA12  DEPUY SYNTHES [id number] Left 1  BIPOLAR AML DEPUY 42MM - [id number] Hips BIPOLAR AML DEPUY 42MM   DEPUY SYNTHES [id number] Left 1

## 2016-10-18 NOTE — Anesthesia Preprocedure Evaluation (Addendum)
Anesthesia Evaluation  Patient identified by MRN, date of birth, ID band Patient awake    Reviewed: Allergy & Precautions, NPO status , Patient's Chart, lab work & pertinent test results  Airway Mallampati: II   Neck ROM: full    Dental   Pulmonary neg pulmonary ROS,    breath sounds clear to auscultation       Cardiovascular hypertension, Pt. on medications and Pt. on home beta blockers (-) angina+ Peripheral Vascular Disease  + dysrhythmias Supra Ventricular Tachycardia  Rhythm:regular Rate:Normal     Neuro/Psych  Headaches,    GI/Hepatic GERD  Medicated,  Endo/Other  Hypothyroidism   Renal/GU Renal InsufficiencyRenal disease (creat 1.29)     Musculoskeletal  (+) Arthritis ,   Abdominal   Peds  Hematology   Anesthesia Other Findings   Reproductive/Obstetrics                            Anesthesia Physical Anesthesia Plan  ASA: III  Anesthesia Plan: General   Post-op Pain Management:    Induction: Intravenous  Airway Management Planned: Oral ETT  Additional Equipment:   Intra-op Plan:   Post-operative Plan: Extubation in OR  Informed Consent: I have reviewed the patients History and Physical, chart, labs and discussed the procedure including the risks, benefits and alternatives for the proposed anesthesia with the patient or authorized representative who has indicated his/her understanding and acceptance.     Plan Discussed with: CRNA, Anesthesiologist and Surgeon  Anesthesia Plan Comments:         Anesthesia Quick Evaluation

## 2016-10-18 NOTE — Anesthesia Postprocedure Evaluation (Signed)
Anesthesia Post Note  Patient: [name]  Procedure(s) Performed: Procedure(s) (LRB): ANTERIOR APPROACH LEFT HEMI HIP ARTHROPLASTY (Left)  Patient location during evaluation: PACU Anesthesia Type: General Level of consciousness: awake Pain management: pain level controlled Vital Signs Assessment: post-procedure vital signs reviewed and stable Respiratory status: spontaneous breathing Cardiovascular status: stable Anesthetic complications: no       Last Vitals:  Vitals:   10/18/16 0957 10/18/16 1705  BP: (!) 130/54   Pulse: 77   Resp: 18   Temp: 37 C 36.4 C    Last Pain:  Vitals:   10/18/16 1705  TempSrc:   PainSc: Asleep                 Niyonna Betsill

## 2016-10-19 ENCOUNTER — Encounter (HOSPITAL_COMMUNITY): Payer: Self-pay | Admitting: Orthopaedic Surgery

## 2016-10-19 LAB — CBC
HCT: 29.1 % — ABNORMAL LOW (ref 36.0–46.0)
Hemoglobin: 9.6 g/dL — ABNORMAL LOW (ref 12.0–15.0)
MCH: 31.3 pg (ref 26.0–34.0)
MCHC: 33 g/dL (ref 30.0–36.0)
MCV: 94.8 fL (ref 78.0–100.0)
PLATELETS: 120 10*3/uL — AB (ref 150–400)
RBC: 3.07 MIL/uL — ABNORMAL LOW (ref 3.87–5.11)
RDW: 13.1 % (ref 11.5–15.5)
WBC: 5.8 10*3/uL (ref 4.0–10.5)

## 2016-10-19 LAB — BASIC METABOLIC PANEL
ANION GAP: 6 (ref 5–15)
BUN: 30 mg/dL — ABNORMAL HIGH (ref 6–20)
CALCIUM: 8.4 mg/dL — AB (ref 8.9–10.3)
CO2: 20 mmol/L — ABNORMAL LOW (ref 22–32)
Chloride: 112 mmol/L — ABNORMAL HIGH (ref 101–111)
Creatinine, Ser: 1.12 mg/dL — ABNORMAL HIGH (ref 0.44–1.00)
GFR, EST AFRICAN AMERICAN: 48 mL/min — AB (ref >60)
GFR, EST NON AFRICAN AMERICAN: 41 mL/min — AB (ref >60)
Glucose, Bld: 162 mg/dL — ABNORMAL HIGH (ref 65–99)
Potassium: 4 mmol/L (ref 3.5–5.1)
Sodium: 138 mmol/L (ref 135–145)

## 2016-10-19 MED ORDER — KETOROLAC TROMETHAMINE 15 MG/ML IJ SOLN: 15.0000 mg | Freq: Four times a day (QID) | INTRAMUSCULAR | Status: DC | PRN

## 2016-10-19 MED ORDER — LISINOPRIL 20 MG PO TABS
20.0000 mg | ORAL_TABLET | Freq: Every day | ORAL | Status: DC
  Administered 2016-10-19 – 2016-10-20 (×2): 20 mg via ORAL
  Filled 2016-10-19 (×2): qty 1

## 2016-10-19 MED ORDER — HYDROCODONE-ACETAMINOPHEN 5-325 MG PO TABS: 1.0000 | ORAL_TABLET | Freq: Three times a day (TID) | ORAL | Status: DC | PRN

## 2016-10-19 MED ORDER — ACETAMINOPHEN 500 MG PO TABS
1000.0000 mg | ORAL_TABLET | Freq: Three times a day (TID) | ORAL | Status: DC
  Administered 2016-10-19 – 2016-10-20 (×4): 1000 mg via ORAL
  Filled 2016-10-19 (×4): qty 2

## 2016-10-19 NOTE — Progress Notes (Signed)
[facility]                                                                              Patient Demographics  [name], is a 83 y.o. female, DOB - 06-28-1921, [id number]  Admit date - 10/16/2016   Admitting Physician Edwin Dada, MD  Outpatient Primary MD for the patient is Wenda Low, MD  Outpatient specialists:   LOS - 3  days    Chief Complaint  Patient presents with  . Fall       Brief summary   Marissa Karam Blackardis a 83 y.o.femalewith a past medical history significant for HTN, hypothyroidism and frequent fallswho presented with hip pain after a fall. Xray with subcapital fracture of the left hip, Ortho consulting  Assessment & Plan    Principal Problem:   Closed subcapital fracture of femur, left, (HCC) - Postop day #1 s/p Prosthetic replacement for femoral neck fracture. - Continue metoprolol - DVT prophylaxis per orthopedics - Per family at the bedside, patient was significantly confused this morning.  - Discontinued norco. Placed on scheduled Tylenol and Toradol as needed.   Active Problems: Hypertension: -Continue amlodipine, diltiazem, metoprolol. Restart lisinopril  Peripheral vascular disease: -hold plavix, will likely discharge off on Lovenox for 4weeks and can resume plavix after that. However, will follow DVT prophylaxis recommendations by orthopedics at discharge  Hypothyroidism: -Continue levothyroxine  Depression -Continue sertraline   Code Status: DNR DVT Prophylaxis:  Lovenox  Family Communication: Discussed in detail with the patient, all imaging results, lab results explained to the patient, daughter at bedside   Disposition Plan: Will need a skilled nursing facility  Time Spent in minutes   25 minutes  Procedures:    Consultants:   Orthopedics  Antimicrobials:      Medications  Scheduled Meds: . acetaminophen  1,000 mg Oral TID  . aspirin EC  325 mg Oral Daily  .  brimonidine  1 drop Left Eye TID  . calcium carbonate  1,000 mg of elemental calcium Oral BID WC  . diltiazem  120 mg Oral QPM  . dorzolamide  1 drop Left Eye BID   And  . timolol  1 drop Left Eye BID  . feeding supplement (ENSURE ENLIVE)  237 mL Oral BID BM  . ferrous sulfate  325 mg Oral QPM  . gemfibrozil  600 mg Oral BID AC  . latanoprost  1 drop Both Eyes QHS  . levothyroxine  25 mcg Oral Q48H  . levothyroxine  50 mcg Oral Q48H  . lisinopril  20 mg Oral Daily  . loperamide  2 mg Oral Daily  . metoprolol  50 mg Oral BID  . pantoprazole  40 mg Oral Daily  . polyethylene glycol  17 g Oral Daily  . senna-docusate  1 tablet Oral BID  . sertraline  100 mg Oral QHS   Continuous Infusions: . sodium chloride 100 mL/hr at 10/19/16 0806  . lactated ringers Stopped (10/18/16 1706)   PRN Meds:.acetaminophen **OR** acetaminophen, alum & mag hydroxide-simeth, bisacodyl, HYDROcodone-acetaminophen, ketorolac, menthol-cetylpyridinium **OR** phenol, metoCLOPramide **OR** metoCLOPramide (REGLAN) injection, ondansetron **OR** ondansetron (ZOFRAN) IV   Antibiotics  Anti-infectives    Start     Dose/Rate Route Frequency Ordered Stop   10/18/16 2100  ceFAZolin (ANCEF) IVPB 2g/100 mL premix     2 g 200 mL/hr over 30 Minutes Intravenous Every 6 hours 10/18/16 1835 10/19/16 0839   10/18/16 0800  ceFAZolin (ANCEF) IVPB 2g/100 mL premix     2 g 200 mL/hr over 30 Minutes Intravenous To [facility] 10/17/16 2124 10/18/16 1519   10/17/16 2130  ceFAZolin (ANCEF) IVPB 2g/100 mL premix  Status:  Discontinued    Comments:  Anesthesia to give preop   2 g 200 mL/hr over 30 Minutes Intravenous  Once 10/17/16 2124 10/17/16 2154        Subjective:   Jacara Mackey was seen and examined today. Per family at the bedside, patient was significantly confused this morning, hallucinating. Patient denies any dizziness, chest pain, shortness of breath, abdominal pain, N/V/D/C, new weakness, numbess,  tingling. No acute events overnight.   no fevers or chills.   Objective:   Vitals:   10/18/16 1805 10/18/16 1833 10/19/16 0221 10/19/16 0625  BP: (!) 136/41 (!) 152/49 (!) 141/61 (!) 172/61  Pulse: 63 67 79 78  Resp: 11  18 18   Temp: 97.2 F (36.2 C) 97.6 F (36.4 C) 98 F (36.7 C) 98.2 F (36.8 C)  TempSrc:  Oral Oral Oral  SpO2: 98% 97% 99% 92%  Weight:      Height:        Intake/Output Summary (Last 24 hours) at 10/19/16 1358 Last data filed at 10/19/16 1100  Gross per 24 hour  Intake          2553.33 ml  Output             1350 ml  Net          1203.33 ml     Wt Readings from Last 3 Encounters:  10/16/16 49 kg (108 lb)  09/12/13 51.9 kg (114 lb 6.4 oz)  08/22/13 62 kg (136 lb 11 oz)     Exam  General: Alert and oriented x 2, NAD  HEENT:   Neck: Supple, no JVD, no masses  Cardiovascular: S1 S2 Clear, RRR  Respiratory: Clear to auscultation bilaterally, no wheezing, rales or rhonchi  Gastrointestinal: Soft, nontender, nondistended, + bowel sounds  Ext: no cyanosis clubbing or edema  Neuro: No new deficits  Skin: No rashes  Psych: Normal affect and demeanor, alert and oriented x2   Data Reviewed:  I have personally reviewed following labs and imaging studies  Micro Results Recent Results (from the past 240 hour(s))  Surgical pcr screen     Status: None   Collection Time: 10/17/16  1:27 PM  Result Value Ref Range Status   MRSA, PCR NEGATIVE NEGATIVE Final   Staphylococcus aureus NEGATIVE NEGATIVE Final    Comment:        The Xpert SA Assay (FDA approved for NASAL specimens in patients over 66 years of age), is one component of a comprehensive surveillance program.  Test performance has been validated by [facility] for patients greater than or equal to 22 year old. It is not intended to diagnose infection nor to guide or monitor treatment.     Radiology Reports Dg Chest 1 View  Result Date: 10/16/2016 CLINICAL DATA:  Acute onset of  left femur pain after fall. Concern for chest injury. Initial encounter. EXAM: CHEST 1 VIEW COMPARISON:  Chest radiograph performed 06/03/2007 FINDINGS: The lungs are well-aerated. Vascular congestion is noted.  Peribronchial thickening is seen. There is no evidence of pleural effusion or pneumothorax. The cardiomediastinal silhouette is borderline normal in size. No acute osseous abnormalities are seen. IMPRESSION: Vascular congestion noted. Peribronchial thickening seen. No displaced rib fractures identified. Electronically Signed   By: Garald Balding M.D.   On: 10/16/2016 21:15   Dg Pelvis 1-2 Views  Result Date: 10/16/2016 CLINICAL DATA:  Acute onset left femur pain after falling. Initial encounter. EXAM: PELVIS - 1-2 VIEW COMPARISON:  Left hip radiographs from 05/05/2015 FINDINGS: There is a displaced subcapital fracture of the left femoral neck, with superior displacement of the distal femur. There is mild superior joint space narrowing at the left hip. Mild degenerative change is noted at the lower lumbar spine. The sacroiliac joints are unremarkable in appearance. The visualized bowel gas pattern is grossly unremarkable in appearance. IMPRESSION: Displaced subcapital fracture of the left femoral neck, with superior displacement of the distal femur. Electronically Signed   By: Garald Balding M.D.   On: 10/16/2016 21:31   Pelvis Portable  Result Date: 10/18/2016 CLINICAL DATA:  Left hip hemi arthroplasty today. EXAM: PORTABLE PELVIS 1-2 VIEWS COMPARISON:  Intraoperative radiograph same day. FINDINGS: A left hip hemiarthroplasty is noted. The femoral component is well seated. The hip appears located on this single view. Gas fluid are present within the joint. Pelvis is intact. Skin staples are noted. IMPRESSION: Left hip hemiarthroplasty without radiographic evidence for complication. Electronically Signed   By: San Morelle M.D.   On: 10/18/2016 18:07   Dg C-arm 1-60 Min  Result Date:  10/18/2016 CLINICAL DATA:  Status post left anterior partial hip arthroplasty EXAM: OPERATIVE left HIP (WITH PELVIS IF PERFORMED) 2 VIEWS TECHNIQUE: Fluoroscopic spot image(s) were submitted for interpretation post-operatively. COMPARISON:  Preoperative studies of October 16, 2016. FINDINGS: 2 fluoro spot images are submitted. The fluoro time reported is 22 seconds. There is a left hip joint prosthesis in place. The interface of the prosthetic components with the native bone appears normal. No acute native bone abnormality is observed. IMPRESSION: Status post left anterior partial hip arthroplasty without evidence of immediate postprocedure complication. Electronically Signed   By: David  Martinique M.D.   On: 10/18/2016 17:00   Dg Hip Operative Unilat W Or W/o Pelvis Left  Result Date: 10/18/2016 CLINICAL DATA:  Status post left anterior partial hip arthroplasty EXAM: OPERATIVE left HIP (WITH PELVIS IF PERFORMED) 2 VIEWS TECHNIQUE: Fluoroscopic spot image(s) were submitted for interpretation post-operatively. COMPARISON:  Preoperative studies of October 16, 2016. FINDINGS: 2 fluoro spot images are submitted. The fluoro time reported is 22 seconds. There is a left hip joint prosthesis in place. The interface of the prosthetic components with the native bone appears normal. No acute native bone abnormality is observed. IMPRESSION: Status post left anterior partial hip arthroplasty without evidence of immediate postprocedure complication. Electronically Signed   By: David  Martinique M.D.   On: 10/18/2016 17:00   Dg Femur Min 2 Views Left  Result Date: 10/16/2016 CLINICAL DATA:  Status post fall, with proximal left femur pain. Initial encounter. EXAM: LEFT FEMUR 2 VIEWS COMPARISON:  Left hip radiographs performed 05/05/2015 FINDINGS: There is a displaced subcapital fracture through the left femoral neck, with superior displacement of the distal femur. The left femoral head remains seated at the acetabulum. The left  femur is otherwise intact. Soft tissue swelling is noted overlying the left hip. The knee joint is grossly unremarkable, though not well assessed. No knee joint effusion is seen. Mild vascular calcifications are  seen. IMPRESSION: 1. Displaced subcapital fracture through the left femoral neck, with superior displacement of the distal femur. 2. Mild vascular calcifications seen. Electronically Signed   By: Garald Balding M.D.   On: 10/16/2016 21:16    Lab Data:  CBC:  Recent Labs Lab 10/16/16 2021 10/17/16 0314 10/18/16 0432 10/19/16 0458  WBC 8.3 9.2 7.0 5.8  NEUTROABS 6.7  --   --   --   HGB 13.8 12.1 11.5* 9.6*  HCT 40.6 35.8* 35.8* 29.1*  MCV 94.6 94.0 96.0 94.8  PLT 175 168 126* [id number]*   Basic Metabolic Panel:  Recent Labs Lab 10/16/16 2021 10/17/16 0314 10/18/16 0432 10/19/16 0458  NA 139 140 138 138  K 4.0 3.9 4.2 4.0  CL 109 108 109 112*  CO2 22 22 22  20*  GLUCOSE 115* 117* 107* 162*  BUN 25* 26* 36* 30*  CREATININE 0.98 0.95 1.29* 1.12*  CALCIUM 10.1 9.5 9.2 8.4*   GFR: Estimated Creatinine Clearance: 21.4 mL/min (by C-G formula based on SCr of 1.12 mg/dL (H)). Liver Function Tests: No results for input(s): AST, ALT, ALKPHOS, BILITOT, PROT, ALBUMIN in the last 168 hours. No results for input(s): LIPASE, AMYLASE in the last 168 hours. No results for input(s): AMMONIA in the last 168 hours. Coagulation Profile:  Recent Labs Lab 10/16/16 2021  INR 1.13   Cardiac Enzymes: No results for input(s): CKTOTAL, CKMB, CKMBINDEX, TROPONINI in the last 168 hours. BNP (last 3 results) No results for input(s): PROBNP in the last 8760 hours. HbA1C: No results for input(s): HGBA1C in the last 72 hours. CBG: No results for input(s): GLUCAP in the last 168 hours. Lipid Profile: No results for input(s): CHOL, HDL, LDLCALC, TRIG, CHOLHDL, LDLDIRECT in the last 72 hours. Thyroid Function Tests: No results for input(s): TSH, T4TOTAL, FREET4, T3FREE, THYROIDAB in the last 72  hours. Anemia Panel: No results for input(s): VITAMINB12, FOLATE, FERRITIN, TIBC, IRON, RETICCTPCT in the last 72 hours. Urine analysis:    Component Value Date/Time   COLORURINE YELLOW 08/22/2013 0825   APPEARANCEUR CLEAR 08/22/2013 0825   LABSPEC 1.015 08/22/2013 0825   PHURINE 5.0 08/22/2013 0825   GLUCOSEU NEGATIVE 08/22/2013 0825   HGBUR NEGATIVE 08/22/2013 0825   BILIRUBINUR NEGATIVE 08/22/2013 0825   KETONESUR NEGATIVE 08/22/2013 0825   PROTEINUR NEGATIVE 08/22/2013 0825   UROBILINOGEN 0.2 08/22/2013 0825   NITRITE NEGATIVE 08/22/2013 0825   LEUKOCYTESUR TRACE (A) 08/22/2013 0825     RAI,RIPUDEEP M.D. [facility] 10/19/2016, 1:58 PM  Pager: [phone] Between 7am to 7pm - call Pager - [phone]  After 7pm go to [email or web address] - password [email or web address]  Call night coverage person covering after 7pm

## 2016-10-19 NOTE — Progress Notes (Signed)
   Subjective:  Patient reports pain as moderate.  Objective:   VITALS:   Vitals:   10/18/16 1805 10/18/16 1833 10/19/16 0221 10/19/16 0625  BP: (!) 136/41 (!) 152/49 (!) 141/61 (!) 172/61  Pulse: 63 67 79 78  Resp: 11  18 18   Temp: 97.2 F (36.2 C) 97.6 F (36.4 C) 98 F (36.7 C) 98.2 F (36.8 C)  TempSrc:  Oral Oral Oral  SpO2: 98% 97% 99% 92%  Weight:      Height:        Neurologically intact Neurovascular intact Sensation intact distally Intact pulses distally Dorsiflexion/Plantar flexion intact Incision: dressing C/D/I and no drainage No cellulitis present Compartment soft   Lab Results  Component Value Date   WBC 5.8 10/19/2016   HGB 9.6 (L) 10/19/2016   HCT 29.1 (L) 10/19/2016   MCV 94.8 10/19/2016   PLT 120 (L) 10/19/2016     Assessment/Plan:  1 Day Post-Op   - Expected postop acute blood loss anemia - will monitor for symptoms - Up with PT/OT - DVT ppx - SCDs, ambulation, aspirin, plavix - WBAT operative extremity - Pain control - Discharge planning  [name] 10/19/2016, 8:01 AM [phone]

## 2016-10-19 NOTE — Evaluation (Addendum)
Occupational Therapy Evaluation Patient Details Name: [name] MRN: [id number] DOB: [birth year] Today's Date: 10/19/2016    History of Present Illness 83 y.o. female with a past medical history significant for HTN, hypothyroidism and frequent falls who presents with hip pain after a fall.   Clinical Impression   PTA, pt lived alone with visits from a friend during the day and daughter before bed. Pt was independent in basic ADLs and used RW for functional mobility. Currently, pt requires Max A for LB ADLs, bed mobility, and functional transfers with RW. Pt also presents with some confusion (suspect due to medications); educated family on cognition strategies such as puzzles which are a desired leisure activity of pt. Recommend dc to SNF for further OT to increase occupational performance and participation. Will continue to follow acutely to facilitate safe dc and progress towards goals.     Follow Up Recommendations  SNF;Supervision/Assistance - 24 hour    Equipment Recommendations  None recommended by OT    Recommendations for Other Services       Precautions / Restrictions Precautions Precautions: Fall Restrictions Weight Bearing Restrictions: Yes LLE Weight Bearing: Weight bearing as tolerated      Mobility Bed Mobility Overal bed mobility: Needs Assistance Bed Mobility: Supine to Sit     Supine to sit: Max assist;+2 for physical assistance;HOB elevated     General bed mobility comments: First time pt has moved to EOB since surgery  Transfers Overall transfer level: Needs assistance Equipment used: Rolling walker (2 wheeled) Transfers: Sit to/from Stand Sit to Stand: Max assist;+2 physical assistance;From elevated surface         General transfer comment: Pt requires A to position into standing    Balance Overall balance assessment: Needs assistance;History of Falls Sitting-balance support: Bilateral upper extremity supported;Feet supported Sitting  balance-Leahy Scale: Fair Sitting balance - Comments: max UE use of hand rail initially able to release as sitting continued Postural control: Posterior lean Standing balance support: Bilateral upper extremity supported Standing balance-Leahy Scale: Poor Standing balance comment: Required max VCs for sequencing to transfer to recliner. Pt also benefits from tactile cues for weight shifting and maintaining upright posture in standing                            ADL Overall ADL's : Needs assistance/impaired Eating/Feeding: Set up;Sitting   Grooming: Set up;Sitting   Upper Body Bathing: Moderate assistance;Sitting   Lower Body Bathing: Maximal assistance;Sit to/from stand   Upper Body Dressing : Moderate assistance;Sitting   Lower Body Dressing: Maximal assistance;Sit to/from stand Lower Body Dressing Details (indicate cue type and reason): Pt unable to don socks Toilet Transfer: Maximal assistance;+2 for physical assistance;Stand-pivot;BSC (Simulated at recliner)   Toileting- Clothing Manipulation and Hygiene: Maximal assistance;Sit to/from stand;Cueing for safety;Cueing for sequencing       Functional mobility during ADLs: Moderate assistance;+2 for physical assistance;Cueing for sequencing;Rolling walker (Unable to ambulate yet; just took a few steps to pivot to re) General ADL Comments: Pt able to before basic ADLs but requires Max A for LB ADLs due to pain.      Vision Baseline Vision/History: Glaucoma;Wears glasses       Perception     Praxis      Pertinent Vitals/Pain Pain Assessment: 0-10 Pain Score: 8  Pain Location: back; L leg Pain Descriptors / Indicators: Grimacing;Guarding;Constant;Discomfort Pain Intervention(s): Monitored during session     Hand Dominance Right   Extremity/Trunk Assessment Upper  Extremity Assessment Upper Extremity Assessment: Overall WFL for tasks assessed   Lower Extremity Assessment Lower Extremity Assessment: Defer to  PT evaluation LLE Deficits / Details: decreased L ip mobility due to L hip pain LLE: Unable to fully assess due to pain   Cervical / Trunk Assessment Cervical / Trunk Assessment: Normal   Communication Communication Communication: Expressive difficulties;Other (comment) (family reports that she has been confused while on pain meds)   Cognition Arousal/Alertness: Awake/alert;Suspect due to medications (Confused; difficulty answering questions) Behavior During Therapy: WFL for tasks assessed/performed Overall Cognitive Status: Impaired/Different from baseline Area of Impairment: Following commands;Awareness;Orientation Orientation Level: Disoriented to;Time     Following Commands: Follows one step commands inconsistently;Follows one step commands with increased time Safety/Judgement: Decreased awareness of safety;Decreased awareness of deficits   Problem Solving: Decreased initiation;Difficulty sequencing;Requires verbal cues;Requires tactile cues;Slow processing General Comments: Pt family reports that pt is more confused than baseline   General Comments       Exercises       Shoulder Instructions      Home Living Family/patient expects to be discharged to:: Private residence Living Arrangements: Alone (friends and family during day prn) Available Help at Discharge: [place] Type of Home: House Home Access: Stairs to enter Technical brewer of Steps: 2 Entrance Stairs-Rails: Left Home Layout: One level     Bathroom Shower/Tub: Other (comment) (sits on toilet and sponge bathes )   Bathroom Toilet: Handicapped height Bathroom Accessibility: Yes   Home Equipment: [place] - 2 wheels;Bedside commode;Shower seat          Prior Functioning/Environment Level of Independence: Independent with assistive device(s);Needs assistance (daughter cooks and gets her ready for bed)  Gait / Transfers Assistance Needed: Used walker for funcitonal mobility around  home and in community ADL's / Homemaking Assistance Needed: Pt performs basic ADLS and sponge baths at sink (fear of falling in shower). Friend/family A with IADLs   Comments: Pt friend comes during day (9am-2pm) and daughter comes at night to give dinner and get pt ready for bed        OT Problem List: Decreased strength;Decreased range of motion;Decreased activity tolerance;Impaired balance (sitting and/or standing);Decreased cognition;Decreased safety awareness;Decreased knowledge of use of DME or AE;Pain      OT Treatment/Interventions: Self-care/ADL training;Therapeutic exercise;Energy conservation;DME and/or AE instruction;Therapeutic activities;Cognitive remediation/compensation;Patient/family education    OT Goals(Current goals can be found in the care plan section) Acute Rehab OT Goals Patient Stated Goal: Go home OT Goal Formulation: With patient Time For Goal Achievement: 11/02/16 Potential to Achieve Goals: Good ADL Goals Pt Will Perform Lower Body Dressing: with min assist;with adaptive equipment;sit to/from stand Pt Will Transfer to Toilet: with min guard assist;stand pivot transfer;bedside commode Additional ADL Goal #1: Pt will indepedently verbalize three fall prevention strategies  OT Frequency: Min 2X/week   Barriers to D/C:            Co-evaluation PT/OT/SLP Co-Evaluation/Treatment: Yes Reason for Co-Treatment: For patient/therapist safety;To address functional/ADL transfers PT goals addressed during session: Mobility/safety with mobility OT goals addressed during session: ADL's and self-care      End of Session Equipment Utilized During Treatment: Gait belt;Rolling walker Nurse Communication: Mobility status  Activity Tolerance: Patient tolerated treatment well;Patient limited by pain Patient left: in chair;with call bell/phone within reach;with family/visitor present  OT Visit Diagnosis: Muscle weakness (generalized) (M62.81);History of falling  (Z91.81);Other symptoms and signs involving cognitive function                ADL  either performed or assessed with clinical judgement  Time: 0930-1015 OT Time Calculation (min): 45 min Charges:  OT General Charges $OT Visit: 1 Procedure OT Evaluation $OT Eval Moderate Complexity: 1 Procedure G-Codes:     [place], OTR/L [phone]   [place] Footman Cederick Broadnax 10/19/2016, 10:41 AM

## 2016-10-19 NOTE — NC FL2 (Signed)
Man LEVEL OF CARE SCREENING TOOL     IDENTIFICATION  Patient Name: [name] Birthdate: [birth year] Sex: female Admission Date (Current Location): 10/16/2016  [employer] and [place] Number:  Herbalist and Address:  [facility] [place], [place] [address], [place], [place] [address]      Provider Number: [id number]  Attending Physician Name and Address:  Mendel Corning, MD  Relative Name and Phone Number:       Current Level of Care: Hospital Recommended Level of Care: [place] Prior Approval Number:    Date Approved/Denied:   PASRR Number: [id number]  Discharge Plan: SNF    Current Diagnoses: Patient Active Problem List   Diagnosis Date Noted  . Closed subcapital fracture of femur, left, initial encounter ([place]) 10/16/2016  . Peripheral vascular disease ([place]) 10/16/2016  . Essential hypertension 10/16/2016  . Hypothyroidism, acquired 10/16/2016  . Dry eye 04/21/2015  . H/O surgical procedure 05/25/2014  . 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 Pseudoaphakia 04/10/2014  . Acute appendicitis 08/22/2013    Orientation RESPIRATION BLADDER Height & Weight     Self, Situation, Place, Time  Normal Continent, Indwelling catheter Weight: 108 lb (49 kg) Height:  4\' 11"  (149.9 cm)  BEHAVIORAL SYMPTOMS/MOOD NEUROLOGICAL BOWEL NUTRITION STATUS      Continent Diet (Heart healthy; thin fluids)  AMBULATORY STATUS COMMUNICATION OF NEEDS Skin   Extensive Assist Verbally Other (Comment) (surgical incision on hip)                       Personal Care Assistance Level of Assistance  Bathing, Feeding, Dressing Bathing Assistance: Maximum assistance Feeding assistance: Limited assistance Dressing Assistance: Maximum assistance     Functional Limitations Info  Sight, Hearing, Speech Sight Info: Adequate Hearing Info: Adequate Speech Info: Adequate    SPECIAL CARE FACTORS FREQUENCY  PT (By licensed PT),  OT (By licensed OT)     PT Frequency: 5 OT Frequency: 5            Contractures Contractures Info: Not present    Additional Factors Info  Code Status, Allergies Code Status Info: DNR Allergies Info: Codeine, Other           Current Medications (10/19/2016):  This is the current hospital active medication list Current Facility-Administered Medications  Medication Dose Route Frequency Provider Last Rate Last Dose  . 0.9 %  sodium chloride infusion   Intravenous Continuous Ripudeep Krystal Eaton, MD 100 mL/hr at 10/19/16 0806    . acetaminophen (TYLENOL) tablet 650 mg  650 mg Oral Q6H PRN Naiping Ephriam Jenkins, MD       Or  . acetaminophen (TYLENOL) suppository 650 mg  650 mg Rectal Q6H PRN Leandrew Koyanagi, MD      . acetaminophen (TYLENOL) tablet 1,000 mg  1,000 mg Oral TID Ripudeep Krystal Eaton, MD   1,000 mg at 10/19/16 1017  . alum & mag hydroxide-simeth (MAALOX/MYLANTA) 200-200-20 MG/5ML suspension 30 mL  30 mL Oral Q4H PRN Leandrew Koyanagi, MD      . aspirin EC tablet 325 mg  325 mg Oral Daily Naiping Ephriam Jenkins, MD   325 mg at 10/19/16 1017  . bisacodyl (DULCOLAX) suppository 10 mg  10 mg Rectal Daily PRN Edwin Dada, MD      . brimonidine (ALPHAGAN) 0.15 % ophthalmic solution 1 drop  1 drop Left Eye TID Ripudeep Krystal Eaton, MD   1 drop at 10/19/16 1017  .  calcium carbonate (OS-CAL - dosed in mg of elemental calcium) tablet 1,000 mg of elemental calcium  1,000 mg of elemental calcium Oral BID WC Ripudeep K Rai, MD   1,000 mg of elemental calcium at 10/19/16 0806  . diltiazem (CARDIZEM CD) 24 hr capsule 120 mg  120 mg Oral QPM Edwin Dada, MD   120 mg at 10/17/16 1753  . dorzolamide (TRUSOPT) 2 % ophthalmic solution 1 drop  1 drop Left Eye BID Edwin Dada, MD   1 drop at 10/19/16 1017   And  . timolol (TIMOPTIC) 0.5 % ophthalmic solution 1 drop  1 drop Left Eye BID Edwin Dada, MD   1 drop at 10/19/16 1017  . feeding supplement (ENSURE ENLIVE) (ENSURE ENLIVE) liquid 237 mL  237  mL Oral BID BM Domenic Polite, MD   237 mL at 10/18/16 2215  . ferrous sulfate tablet 325 mg  325 mg Oral QPM Edwin Dada, MD   325 mg at 10/17/16 1753  . gemfibrozil (LOPID) tablet 600 mg  600 mg Oral BID AC Edwin Dada, MD   600 mg at 10/19/16 0806  . HYDROcodone-acetaminophen (NORCO/VICODIN) 5-325 MG per tablet 1-2 tablet  1-2 tablet Oral Q6H PRN Leandrew Koyanagi, MD   2 tablet at 10/19/16 0102  . ketorolac (TORADOL) 15 MG/ML injection 15 mg  15 mg Intravenous Q6H PRN Ripudeep K Rai, MD      . lactated ringers infusion   Intravenous Continuous Naiping Ephriam Jenkins, MD   Stopped at 10/18/16 1706  . latanoprost (XALATAN) 0.005 % ophthalmic solution 1 drop  1 drop Both Eyes QHS Domenic Polite, MD   1 drop at 10/18/16 2256  . levothyroxine (SYNTHROID, LEVOTHROID) tablet 25 mcg  25 mcg Oral Q48H Edwin Dada, MD   Stopped at 10/18/16 0813  . levothyroxine (SYNTHROID, LEVOTHROID) tablet 50 mcg  50 mcg Oral Q48H Edwin Dada, MD   50 mcg at 10/19/16 0636  . lisinopril (PRINIVIL,ZESTRIL) tablet 20 mg  20 mg Oral Daily Ripudeep K Rai, MD   20 mg at 10/19/16 1016  . loperamide (IMODIUM) capsule 2 mg  2 mg Oral Daily Ripudeep K Rai, MD   2 mg at 10/19/16 1016  . menthol-cetylpyridinium (CEPACOL) lozenge 3 mg  1 lozenge Oral PRN Naiping Ephriam Jenkins, MD       Or  . phenol (CHLORASEPTIC) mouth spray 1 spray  1 spray Mouth/Throat PRN Naiping Ephriam Jenkins, MD      . metoCLOPramide (REGLAN) tablet 5-10 mg  5-10 mg Oral Q8H PRN Naiping Ephriam Jenkins, MD       Or  . metoCLOPramide (REGLAN) injection 5-10 mg  5-10 mg Intravenous Q8H PRN Naiping Ephriam Jenkins, MD      . metoprolol tartrate (LOPRESSOR) tablet 50 mg  50 mg Oral BID Edwin Dada, MD   50 mg at 10/19/16 1016  . ondansetron (ZOFRAN) tablet 4 mg  4 mg Oral Q6H PRN Naiping Ephriam Jenkins, MD       Or  . ondansetron [facility]) injection 4 mg  4 mg Intravenous Q6H PRN Naiping Ephriam Jenkins, MD      . pantoprazole (PROTONIX) EC tablet 40 mg  40 mg Oral Daily Edwin Dada, MD   40 mg at 10/19/16 1017  . polyethylene glycol (MIRALAX / GLYCOLAX) packet 17 g  17 g Oral Daily Ripudeep K Rai, MD   17 g at 10/19/16 1017  . senna-docusate (Senokot-S) tablet 1  tablet  1 tablet Oral BID Ripudeep Krystal Eaton, MD   1 tablet at 10/19/16 1017  . sertraline (ZOLOFT) tablet 100 mg  100 mg Oral QHS Edwin Dada, MD   100 mg at 10/18/16 2213     Discharge Medications: Please see discharge summary for a list of discharge medications.  Relevant Imaging Results:  Relevant Lab Results:   Additional Information SSN: [id number]  Truitt Merle, LCSW

## 2016-10-19 NOTE — Evaluation (Addendum)
Physical Therapy Evaluation Patient Details Name: [name] MRN: [id number] DOB: 1920-11-13 Today's Date: 10/19/2016   History of Present Illness  83 y.o. female with a past medical history significant for HTN, hypothyroidism and frequent falls who presents with hip pain after a fall.  Clinical Impression  Pt admitted with above diagnosis. Pt currently with functional limitations due to the deficits listed below (see PT Problem List). Pt max Ax2 for bed mobility, transfers and ambulation. Pt easily confused by sequencing of events even with single step cuing and helped with max tactile cuing for leg advancement. Pt will benefit from skilled PT to increase their independence and safety with mobility to allow discharge to the venue listed below.       Follow Up Recommendations SNF    Equipment Recommendations  None recommended by PT    Recommendations for Other Services       Precautions / Restrictions Precautions Precautions: Fall Restrictions Weight Bearing Restrictions: Yes LLE Weight Bearing: Weight bearing as tolerated      Mobility  Bed Mobility Overal bed mobility: Needs Assistance Bed Mobility: Supine to Sit     Supine to sit: Max assist;+2 for physical assistance;HOB elevated     General bed mobility comments: First time pt has moved to EOB since surgery  Transfers Overall transfer level: Needs assistance Equipment used: Rolling walker (2 wheeled) Transfers: Sit to/from Stand Sit to Stand: Max assist;+2 physical assistance;From elevated surface         General transfer comment: Pt requires A to position into standing  Ambulation/Gait Ambulation/Gait assistance: Max assist;+2 physical assistance Ambulation Distance (Feet): 2 Feet Assistive device: Standard walker Gait Pattern/deviations: Step-to pattern;Decreased step length - left;Decreased step length - right;Antalgic;Shuffle;Decreased weight shift to right;Decreased weight shift to left Gait  velocity: decreased Gait velocity interpretation: Below normal speed for age/gender General Gait Details: pt required max vc for sequencing of gait especially weight shift to unweight opposite foot for movement bilaterally  Stairs            Wheelchair Mobility    Modified Rankin (Stroke Patients Only)       Balance Overall balance assessment: Needs assistance;History of Falls Sitting-balance support: Bilateral upper extremity supported;Feet supported Sitting balance-Leahy Scale: Fair Sitting balance - Comments: max UE use of hand rail initially able to release as sitting continued Postural control: Posterior lean Standing balance support: Bilateral upper extremity supported Standing balance-Leahy Scale: Poor Standing balance comment: Required max VCs for sequencing to transfer to recliner. Pt also benefits from tactile cues for weight shifting and maintaining upright posture in standing                             Pertinent Vitals/Pain Pain Assessment: 0-10 Pain Score: 8  Pain Location: back; L leg Pain Descriptors / Indicators: Grimacing;Guarding;Constant;Discomfort Pain Intervention(s): Monitored during session  VSS    Home Living Family/patient expects to be discharged to:: Private residence Living Arrangements: Alone (friends and family during day prn) Available Help at Discharge: [place] Type of Home: House Home Access: Stairs to enter Entrance Stairs-Rails: Left Entrance Stairs-Number of Steps: 2 Home Layout: One level Home Equipment: Environmental consultant - 2 wheels;Bedside commode;Shower seat      Prior Function Level of Independence: Independent with assistive device(s);Needs assistance (daughter cooks and gets her ready for bed)   Gait / Transfers Assistance Needed: Used walker for funcitonal mobility around home and in community  ADL's / [employer]  Needed: Pt performs basic ADLS and sponge baths at sink (fear of falling in  shower). Friend/family A with IADLs  Comments: Pt friend comes during day (9am-2pm) and daughter comes at night to give dinner and get pt ready for bed     Hand Dominance   Dominant Hand: Right    Extremity/Trunk Assessment   Upper Extremity Assessment Upper Extremity Assessment: Overall WFL for tasks assessed    Lower Extremity Assessment Lower Extremity Assessment: Defer to PT evaluation LLE Deficits / Details: decreased L ip mobility due to L hip pain LLE: Unable to fully assess due to pain    Cervical / Trunk Assessment Cervical / Trunk Assessment: Normal  Communication   Communication: Expressive difficulties;Other (comment) (family reports that she has been confused while on pain meds)  Cognition Arousal/Alertness: Awake/alert;Suspect due to medications (Confused; difficulty answering questions) Behavior During Therapy: WFL for tasks assessed/performed Overall Cognitive Status: Impaired/Different from baseline Area of Impairment: Following commands;Awareness;Orientation Orientation Level: Disoriented to;Time     Following Commands: Follows one step commands inconsistently;Follows one step commands with increased time Safety/Judgement: Decreased awareness of safety;Decreased awareness of deficits   Problem Solving: Decreased initiation;Difficulty sequencing;Requires verbal cues;Requires tactile cues;Slow processing General Comments: Pt family reports that pt is more confused than baseline    General Comments      Exercises     Assessment/Plan    PT Assessment Patient needs continued PT services  PT Problem List Decreased strength;Decreased range of motion;Decreased activity tolerance;Decreased balance;Decreased mobility;Decreased coordination;Decreased cognition;Decreased knowledge of use of DME;Decreased safety awareness;Pain       PT Treatment Interventions Gait training;Stair training;DME instruction;Functional mobility training;Therapeutic  activities;Therapeutic exercise;Balance training;Patient/family education    PT Goals (Current goals can be found in the Care Plan section)  Acute Rehab PT Goals Patient Stated Goal: Go home PT Goal Formulation: With patient/family Time For Goal Achievement: 11/02/16 Potential to Achieve Goals: Fair    Frequency Min 2X/week   Barriers to discharge        Co-evaluation PT/OT/SLP Co-Evaluation/Treatment: Yes Reason for Co-Treatment: For patient/therapist safety;To address functional/ADL transfers PT goals addressed during session: Mobility/safety with mobility OT goals addressed during session: ADL's and self-care       End of Session Equipment Utilized During Treatment: Gait belt Activity Tolerance: Patient limited by pain;Patient limited by fatigue Patient left: in chair;with call bell/phone within reach;with family/visitor present Nurse Communication: Mobility status PT Visit Diagnosis: Unsteadiness on feet (R26.81);History of falling (Z91.81);Other abnormalities of gait and mobility (R26.89);Pain Pain - Right/Left: Left Pain - part of body: Hip         Time: [id number] PT Time Calculation (min) (ACUTE ONLY): 45 min   Charges:   PT Evaluation $PT Eval Moderate Complexity: 1 Procedure PT Treatments $Therapeutic Activity: 8-22 mins   PT G Codes:         Bailey Mech Fleet 10/19/2016, 10:47 AM  Dani Gobble. Migdalia Dk PT, DPT Acute Rehabilitation  [phone] Pager [phone]

## 2016-10-19 NOTE — Clinical Social Work Note (Signed)
CSW spoke with Nira Conn at [facility] to confirm bed availability. Nira Conn stated she would call CSW back if she can make an offer. CSW observed that [facility] made and offer in SNF Hub. Clinicals sent. CSW left VM for Heather to confirm receipt and get room and report information. CSW will continue to follow.   Oretha Ellis, [place], [place],  Clinical Social Work [phone]

## 2016-10-20 DIAGNOSIS — Z9181 History of falling: Secondary | ICD-10-CM | POA: Diagnosis not present

## 2016-10-20 DIAGNOSIS — R278 Other lack of coordination: Secondary | ICD-10-CM | POA: Diagnosis not present

## 2016-10-20 DIAGNOSIS — M6281 Muscle weakness (generalized): Secondary | ICD-10-CM | POA: Diagnosis not present

## 2016-10-20 DIAGNOSIS — Z471 Aftercare following joint replacement surgery: Secondary | ICD-10-CM | POA: Diagnosis not present

## 2016-10-20 DIAGNOSIS — R54 Age-related physical debility: Secondary | ICD-10-CM | POA: Diagnosis not present

## 2016-10-20 DIAGNOSIS — S3992XA Unspecified injury of lower back, initial encounter: Secondary | ICD-10-CM | POA: Diagnosis not present

## 2016-10-20 DIAGNOSIS — E038 Other specified hypothyroidism: Secondary | ICD-10-CM | POA: Diagnosis not present

## 2016-10-20 DIAGNOSIS — S72002D Fracture of unspecified part of neck of left femur, subsequent encounter for closed fracture with routine healing: Secondary | ICD-10-CM | POA: Diagnosis not present

## 2016-10-20 DIAGNOSIS — F418 Other specified anxiety disorders: Secondary | ICD-10-CM | POA: Diagnosis not present

## 2016-10-20 DIAGNOSIS — E039 Hypothyroidism, unspecified: Secondary | ICD-10-CM | POA: Diagnosis not present

## 2016-10-20 DIAGNOSIS — I82409 Acute embolism and thrombosis of unspecified deep veins of unspecified lower extremity: Secondary | ICD-10-CM | POA: Diagnosis not present

## 2016-10-20 DIAGNOSIS — R259 Unspecified abnormal involuntary movements: Secondary | ICD-10-CM | POA: Diagnosis not present

## 2016-10-20 DIAGNOSIS — M25551 Pain in right hip: Secondary | ICD-10-CM | POA: Diagnosis not present

## 2016-10-20 DIAGNOSIS — E440 Moderate protein-calorie malnutrition: Secondary | ICD-10-CM | POA: Diagnosis not present

## 2016-10-20 DIAGNOSIS — S72012D Unspecified intracapsular fracture of left femur, subsequent encounter for closed fracture with routine healing: Secondary | ICD-10-CM | POA: Diagnosis not present

## 2016-10-20 DIAGNOSIS — I739 Peripheral vascular disease, unspecified: Secondary | ICD-10-CM | POA: Diagnosis not present

## 2016-10-20 DIAGNOSIS — G8929 Other chronic pain: Secondary | ICD-10-CM | POA: Diagnosis not present

## 2016-10-20 DIAGNOSIS — F329 Major depressive disorder, single episode, unspecified: Secondary | ICD-10-CM | POA: Diagnosis not present

## 2016-10-20 DIAGNOSIS — M25552 Pain in left hip: Secondary | ICD-10-CM | POA: Diagnosis not present

## 2016-10-20 DIAGNOSIS — S72009A Fracture of unspecified part of neck of unspecified femur, initial encounter for closed fracture: Secondary | ICD-10-CM | POA: Diagnosis not present

## 2016-10-20 DIAGNOSIS — I10 Essential (primary) hypertension: Secondary | ICD-10-CM | POA: Diagnosis not present

## 2016-10-20 DIAGNOSIS — R52 Pain, unspecified: Secondary | ICD-10-CM | POA: Diagnosis not present

## 2016-10-20 DIAGNOSIS — R41841 Cognitive communication deficit: Secondary | ICD-10-CM | POA: Diagnosis not present

## 2016-10-20 DIAGNOSIS — R0902 Hypoxemia: Secondary | ICD-10-CM | POA: Diagnosis not present

## 2016-10-20 DIAGNOSIS — S72142A Displaced intertrochanteric fracture of left femur, initial encounter for closed fracture: Secondary | ICD-10-CM | POA: Diagnosis not present

## 2016-10-20 DIAGNOSIS — M47817 Spondylosis without myelopathy or radiculopathy, lumbosacral region: Secondary | ICD-10-CM | POA: Diagnosis not present

## 2016-10-20 DIAGNOSIS — S72012A Unspecified intracapsular fracture of left femur, initial encounter for closed fracture: Secondary | ICD-10-CM | POA: Diagnosis not present

## 2016-10-20 DIAGNOSIS — M4856XA Collapsed vertebra, not elsewhere classified, lumbar region, initial encounter for fracture: Secondary | ICD-10-CM | POA: Diagnosis not present

## 2016-10-20 DIAGNOSIS — M545 Low back pain: Secondary | ICD-10-CM | POA: Diagnosis not present

## 2016-10-20 LAB — CBC
HCT: 26.7 % — ABNORMAL LOW (ref 36.0–46.0)
Hemoglobin: 8.8 g/dL — ABNORMAL LOW (ref 12.0–15.0)
MCH: 31 pg (ref 26.0–34.0)
MCHC: 33 g/dL (ref 30.0–36.0)
MCV: 94 fL (ref 78.0–100.0)
PLATELETS: 136 10*3/uL — AB (ref 150–400)
RBC: 2.84 MIL/uL — ABNORMAL LOW (ref 3.87–5.11)
RDW: 13.2 % (ref 11.5–15.5)
WBC: 7 10*3/uL (ref 4.0–10.5)

## 2016-10-20 LAB — BASIC METABOLIC PANEL
ANION GAP: 9 (ref 5–15)
BUN: 28 mg/dL — ABNORMAL HIGH (ref 6–20)
CALCIUM: 9 mg/dL (ref 8.9–10.3)
CO2: 22 mmol/L (ref 22–32)
CREATININE: 1.16 mg/dL — AB (ref 0.44–1.00)
Chloride: 109 mmol/L (ref 101–111)
GFR, EST AFRICAN AMERICAN: 46 mL/min — AB (ref >60)
GFR, EST NON AFRICAN AMERICAN: 39 mL/min — AB (ref >60)
Glucose, Bld: 131 mg/dL — ABNORMAL HIGH (ref 65–99)
Potassium: 3.8 mmol/L (ref 3.5–5.1)
SODIUM: 140 mmol/L (ref 135–145)

## 2016-10-20 MED ORDER — SENNOSIDES-DOCUSATE SODIUM 8.6-50 MG PO TABS: 1.0000 | ORAL_TABLET | Freq: Two times a day (BID) | ORAL | Status: DC

## 2016-10-20 MED ORDER — BISACODYL 10 MG RE SUPP: 10.0000 mg | Freq: Every day | RECTAL | 0 refills | Status: DC | PRN

## 2016-10-20 MED ORDER — ACETAMINOPHEN 500 MG PO TABS: 1000.0000 mg | ORAL_TABLET | Freq: Three times a day (TID) | ORAL | 0 refills | Status: DC

## 2016-10-20 MED ORDER — ENSURE ENLIVE PO LIQD: 237.0000 mL | Freq: Two times a day (BID) | ORAL | 12 refills | Status: DC

## 2016-10-20 MED ORDER — POLYETHYLENE GLYCOL 3350 17 G PO PACK: 17.0000 g | PACK | Freq: Every day | ORAL | 0 refills | Status: DC

## 2016-10-20 NOTE — Clinical Social Work Placement (Signed)
   CLINICAL SOCIAL WORK PLACEMENT  NOTE  Date:  10/20/2016  Patient Details  Name: FLORIDALMA LIAO MRN: [id number] Date of Birth: 1921-03-08  Clinical Social Work is seeking post-discharge placement for this patient at the [place] level of care (*CSW will initial, date and re-position this form in  chart as items are completed):  Yes   Patient/family provided with [place] Work Department's list of facilities offering this level of care within the geographic area requested by the patient (or if unable, by the patient's family).  Yes   Patient/family informed of their freedom to choose among providers that offer the needed level of care, that participate in Medicare, Medicaid or managed care program needed by the patient, have an available bed and are willing to accept the patient.  Yes   Patient/family informed of [facility]'s ownership interest in [facility] and [facility], as well as of the fact that they are under no obligation to receive care at these facilities.  PASRR submitted to EDS on 10/17/16     PASRR number received on 10/17/16     Existing PASRR number confirmed on       FL2 transmitted to all facilities in geographic area requested by pt/family on 10/19/16     FL2 transmitted to all facilities within larger geographic area on       Patient informed that his/her managed care company has contracts with or will negotiate with certain facilities, including the following:        Yes   Patient/family informed of bed offers received.  Patient chooses bed at [place], [place]     Physician recommends and patient chooses bed at      Patient to be transferred to [place], [place] on 10/20/16.  Patient to be transferred to facility by [facility]     Patient family notified on 10/20/16 of transfer.  Name of family member notified:  [name]     PHYSICIAN       Additional Comment:  Pt is ready for discharge  today and will go to [facility]. Pt and daughter, [name], are aware and agreeable to discharge plan. CSW sent clinicals to [facility] and communicated with [facility] (admissions) for room and report. Heather stated Colletta [place] would contact daughter to arrange a time to complete paperwork. CSW informed daughter of this information. Room and report provided to RN and put in treatment team sticky note. Transportation arranged with [facility]. Signed DNR and d/c paperwork on chart. CSW is signing off as no further needs identified.  _______________________________________________ Truitt Merle, LCSW 10/20/2016, 2:34 PM

## 2016-10-20 NOTE — Discharge Summary (Signed)
Physician Discharge Summary   Patient ID: [name] MRN: [id number] DOB/AGE: July 09, 1921 83 y.o.  Admit date: 10/16/2016 Discharge date: 10/20/2016  Primary Care Physician:  Wenda Low, MD  Discharge Diagnoses:    . Closed subcapital fracture of femur, left, initial encounter ([place]) . Peripheral vascular disease ([place]) . Essential hypertension . Hypothyroidism, acquired   Constipation   Acute encephalopathy secondary to narcotics   Consults: Orthopedics, Dr Erlinda Hong   Recommendations for Outpatient Follow-up:  1. Please repeat CBC/BMET on Monday, 10/23/16. Patient may need 1 unit of packed RBC transfusion if hemoglobin is trending down, less than 7.5. Patient did not need any blood transfusion postop. 2. Orthopedic recommendations Up with PT/OT - DVT ppx - SCDs, ambulation, aspirin, plavix - WBAT operative extremity - Pain control  DIET: Heart healthy diet   Allergies:   Allergies  Allergen Reactions  . Codeine Other (See Comments)    "feel like I'm floating"  . Other Other (See Comments)    All pain meds cause lethargy and disorientation     DISCHARGE MEDICATIONS: Current Discharge Medication List    START taking these medications   Details  acetaminophen (TYLENOL) 500 MG tablet Take 2 tablets (1,000 mg total) by mouth 3 (three) times daily. X 3 DAYS Qty: 30 tablet, Refills: 0    aspirin EC 325 MG tablet Take 1 tablet (325 mg total) by mouth 2 (two) times daily. Qty: 84 tablet, Refills: 0    bisacodyl (DULCOLAX) 10 MG suppository Place 1 suppository (10 mg total) rectally daily as needed for moderate constipation. Qty: 12 suppository, Refills: 0    feeding supplement, ENSURE ENLIVE, (ENSURE ENLIVE) LIQD Take 237 mLs by mouth 2 (two) times daily between meals. Qty: 237 mL, Refills: 12    polyethylene glycol (MIRALAX / GLYCOLAX) packet Take 17 g by mouth daily. Qty: 14 each, Refills: 0    senna-docusate (SENOKOT-S) 8.6-50 MG tablet Take 1 tablet by mouth  2 (two) times daily.      CONTINUE these medications which have NOT CHANGED   Details  bimatoprost (LUMIGAN) 0.01 % SOLN Place 1 drop into both eyes at bedtime.    BIOTIN PO Take 1 tablet by mouth daily.    brimonidine (ALPHAGAN P) 0.1 % SOLN Place 1 drop into the left eye 3 (three) times daily.    Calcium Citrate-Vitamin D (CALCITRATE) 315-250 MG-UNIT TABS Take 2 tablets by mouth 2 (two) times daily.    clopidogrel (PLAVIX) 75 MG tablet Take 37.5 mg by mouth every other day.    diltiazem (DILACOR XR) 120 MG 24 hr capsule Take 120 mg by mouth every evening.     dorzolamide-timolol (COSOPT) 22.3-6.8 MG/ML ophthalmic solution Place 1 drop into the left eye 2 (two) times daily.    ferrous sulfate 325 (65 FE) MG EC tablet Take 325 mg by mouth every evening.    fexofenadine (ALLEGRA) 180 MG tablet Take 180 mg by mouth daily.    gemfibrozil (LOPID) 600 MG tablet Take 600 mg by mouth 2 (two) times daily before a meal.    levothyroxine (SYNTHROID, LEVOTHROID) 50 MCG tablet Take 25-50 mcg by mouth See admin instructions. Takes 1/2 tab on even days Takes 1 tab on odd days    lisinopril (PRINIVIL,ZESTRIL) 20 MG tablet Take 20 mg by mouth daily.    metoprolol (LOPRESSOR) 50 MG tablet Take 50 mg by mouth 2 (two) times daily.    Omega-3 Fatty Acids (FISH OIL) 1000 MG CAPS Take 1 capsule by mouth 2 (  two) times daily.    omeprazole (PRILOSEC) 20 MG capsule Take 20 mg by mouth daily.    sertraline (ZOLOFT) 100 MG tablet Take 100 mg by mouth at bedtime.     VITAMIN D, CHOLECALCIFEROL, PO Take 1 tablet by mouth every evening.      STOP taking these medications     loperamide (IMODIUM A-D) 2 MG tablet          Brief H and P: For complete details please refer to admission H and P, but in brief [name] a 83 y.o.femalewith a past medical history significant for HTN, hypothyroidism and frequent fallswho presentedwith hip pain after a fall. Xray withsubcapital fracture of  the left hip, orthopedics was consulted.  Hospital Course:   Closed subcapital fracture of femur, left, ([place]) - Postop day #2 s/p Prosthetic replacement for femoral neck fracture. - Continue metoprolol -Patient became significantly confused and hallucinating after the surgery due to narcotics. Norco was discontinued. Patient was placed on scheduled Tylenol and Toradol as needed. Patient is now much more alert and oriented and back to her baseline mental status. - Recommend keeping patient on scheduled Tylenol for couple more days and reassess by M.D. At the facility. - Per orthopedics, placed on aspirin for DVT prophylaxis and continue Plavix - PT eval recommended skilled nursing facility  Hypertension: -Continue amlodipine, diltiazem, metoprolol and lisinopril  Peripheral vascular disease: -Continue Plavix, - Orthopedics recommended aspirin at least for 1 month for DVT prophylaxis along with continuing Plavix.   Hypothyroidism: -Continue levothyroxine  Depression -Continue sertraline   Day of Discharge BP (!) 126/41 (BP Location: Right Arm)   Pulse 81   Temp 98.3 F (36.8 C) (Oral)   Resp 18   Ht 4\' 11"  (1.499 m)   Wt 49 kg (108 lb)   SpO2 93%   BMI 21.81 kg/m   Physical Exam: General: Alert and awake oriented x3 not in any acute distress. HEENT: anicteric sclera, pupils reactive to light and accommodation CVS: S1-S2 clear no murmur rubs or gallops Chest: clear to auscultation bilaterally, no wheezing rales or rhonchi Abdomen: soft nontender, nondistended, normal bowel sounds Extremities: no cyanosis, clubbing or edema noted bilaterally    The results of significant diagnostics from this hospitalization (including imaging, microbiology, ancillary and laboratory) are listed below for reference.    LAB RESULTS: Basic Metabolic Panel:  Recent Labs Lab 10/19/16 0458 10/20/16 0517  NA 138 140  K 4.0 3.8  CL 112* 109  CO2 20* 22  GLUCOSE 162* 131*  BUN  30* 28*  CREATININE 1.12* 1.16*  CALCIUM 8.4* 9.0   Liver Function Tests: No results for input(s): AST, ALT, ALKPHOS, BILITOT, PROT, ALBUMIN in the last 168 hours. No results for input(s): LIPASE, AMYLASE in the last 168 hours. No results for input(s): AMMONIA in the last 168 hours. CBC:  Recent Labs Lab 10/16/16 2021  10/19/16 0458 10/20/16 0517  WBC 8.3  < > 5.8 7.0  NEUTROABS 6.7  --   --   --   HGB 13.8  < > 9.6* 8.8*  HCT 40.6  < > 29.1* 26.7*  MCV 94.6  < > 94.8 94.0  PLT 175  < > 120* 136*  < > = values in this interval not displayed. Cardiac Enzymes: No results for input(s): CKTOTAL, CKMB, CKMBINDEX, TROPONINI in the last 168 hours. BNP: Invalid input(s): POCBNP CBG: No results for input(s): GLUCAP in the last 168 hours.  Significant Diagnostic Studies:  Dg Chest 1  View  Result Date: 10/16/2016 CLINICAL DATA:  Acute onset of left femur pain after fall. Concern for chest injury. Initial encounter. EXAM: CHEST 1 VIEW COMPARISON:  Chest radiograph performed 06/03/2007 FINDINGS: The lungs are well-aerated. Vascular congestion is noted. Peribronchial thickening is seen. There is no evidence of pleural effusion or pneumothorax. The cardiomediastinal silhouette is borderline normal in size. No acute osseous abnormalities are seen. IMPRESSION: Vascular congestion noted. Peribronchial thickening seen. No displaced rib fractures identified. Electronically Signed   By: Garald Balding M.D.   On: 10/16/2016 21:15   Dg Pelvis 1-2 Views  Result Date: 10/16/2016 CLINICAL DATA:  Acute onset left femur pain after falling. Initial encounter. EXAM: PELVIS - 1-2 VIEW COMPARISON:  Left hip radiographs from 05/05/2015 FINDINGS: There is a displaced subcapital fracture of the left femoral neck, with superior displacement of the distal femur. There is mild superior joint space narrowing at the left hip. Mild degenerative change is noted at the lower lumbar spine. The sacroiliac joints are  unremarkable in appearance. The visualized bowel gas pattern is grossly unremarkable in appearance. IMPRESSION: Displaced subcapital fracture of the left femoral neck, with superior displacement of the distal femur. Electronically Signed   By: Garald Balding M.D.   On: 10/16/2016 21:31   Dg Femur Min 2 Views Left  Result Date: 10/16/2016 CLINICAL DATA:  Status post fall, with proximal left femur pain. Initial encounter. EXAM: LEFT FEMUR 2 VIEWS COMPARISON:  Left hip radiographs performed 05/05/2015 FINDINGS: There is a displaced subcapital fracture through the left femoral neck, with superior displacement of the distal femur. The left femoral head remains seated at the acetabulum. The left femur is otherwise intact. Soft tissue swelling is noted overlying the left hip. The knee joint is grossly unremarkable, though not well assessed. No knee joint effusion is seen. Mild vascular calcifications are seen. IMPRESSION: 1. Displaced subcapital fracture through the left femoral neck, with superior displacement of the distal femur. 2. Mild vascular calcifications seen. Electronically Signed   By: Garald Balding M.D.   On: 10/16/2016 21:16    2D ECHO:   Disposition and Follow-up: Discharge Instructions    Increase activity slowly    Complete by:  As directed    Weight bearing as tolerated    Complete by:  As directed        DISPOSITION: Skilled nursing facility   [place] information for follow-up providers    Eduard Roux, MD Follow up in 2 week(s).   Specialty:  Orthopedic Surgery Why:  For suture removal, For wound re-check Contact information: [place] [address] [phone]            Contact information for after-discharge care    Destination    [facility] Follow up.   Specialty:  Skilled Nursing Facility Contact information: [place] [phone]                    Time spent on Discharge: 75mins   Signed:   Hilary Milks M.D. [facility] 10/20/2016, 11:59 AM Pager: [phone]

## 2016-10-20 NOTE — Progress Notes (Addendum)
Patient discharged to [facility] and reported to nurse Derinda Sis, transported via [facility].

## 2016-10-20 NOTE — Progress Notes (Signed)
   Subjective:  Patient is much more alert this morning. Was able to stand and use bedside commode yesterday without much discomfort  Objective:   VITALS:   Vitals:   10/19/16 0625 10/19/16 1400 10/19/16 2030 10/20/16 0527  BP: (!) 172/61 130/62 (!) 136/47 (!) 126/41  Pulse: 78 82 81   Resp: 18 18 18 18   Temp: 98.2 F (36.8 C) 98.2 F (36.8 C) 98.3 F (36.8 C) 98.3 F (36.8 C)  TempSrc: Oral Oral Oral Oral  SpO2: 92% 93% 92% 93%  Weight:      Height:        Neurologically intact Neurovascular intact Sensation intact distally Intact pulses distally Dorsiflexion/Plantar flexion intact Incision: dressing C/D/I and no drainage No cellulitis present Compartment soft   Lab Results  Component Value Date   WBC 7.0 10/20/2016   HGB 8.8 (L) 10/20/2016   HCT 26.7 (L) 10/20/2016   MCV 94.0 10/20/2016   PLT 136 (L) 10/20/2016     Assessment/Plan:  2 Days Post-Op   - Expected postop acute blood loss anemia - will monitor for symptoms - stable from ortho stand point for dc to [facility] - f/u 2 weeks  Eduard Roux 10/20/2016, 7:45 AM [phone]

## 2016-10-20 NOTE — Progress Notes (Signed)
Physical Therapy Treatment Patient Details Name: [name] MRN: [id number] DOB: 01/22/1921 Today's Date: 10/20/2016    History of Present Illness 83 y.o. female with a past medical history significant for HTN, hypothyroidism and frequent falls who presents with hip pain after a fall.    PT Comments    Pt with nursing at entry and requested help with 2 person transfer to [facility]. Pt requires max simple verbal cuing for sequencing of movement. Pt maxAx2 for bed mobilit, transfers, and ambulation with RW for balance and verbal and tactile cuing of extremities in movement. Pt getting ready for d/c to SNF and will require continued skilled PT there to progress bed mobility, transfers and ambulation to improve mobility and safety in her environment.     Follow Up Recommendations  SNF     Equipment Recommendations  None recommended by PT    Recommendations for Other Services       Precautions / Restrictions Precautions Precautions: Fall Restrictions Weight Bearing Restrictions: Yes LLE Weight Bearing: Weight bearing as tolerated    Mobility  Bed Mobility Overal bed mobility: Needs Assistance Bed Mobility: Sit to Supine     Supine to sit: Max assist;+2 for physical assistance     General bed mobility comments: needed assistance of pad to move in the bed  Transfers Overall transfer level: Needs assistance Equipment used: Rolling walker (2 wheeled) Transfers: Sit to/from Stand Sit to Stand: Max assist;+2 physical assistance         General transfer comment: Pt with nursing transferring to [facility] when entered, needed +2 to come to standing,, needed vc for hand placement to push off from bed and proper R foot placement for most mechanical advantage,   Ambulation/Gait Ambulation/Gait assistance: Max assist;+2 physical assistance   Assistive device: Standard walker Gait Pattern/deviations: Step-to pattern;Decreased step length - left;Decreased step length -  right;Antalgic;Shuffle;Decreased weight shift to right;Decreased weight shift to left Gait velocity: decreased Gait velocity interpretation: Below normal speed for age/gender General Gait Details: continues to require max vc for sequencing of weight shift and foot placement.          Balance Overall balance assessment: Needs assistance Sitting-balance support: Bilateral upper extremity supported;Feet supported Sitting balance-Leahy Scale: Fair Sitting balance - Comments: max UE use of hand rail initially able to release as sitting continued Postural control: Posterior lean Standing balance support: Bilateral upper extremity supported Standing balance-Leahy Scale: Poor Standing balance comment: pt needs max assist for posterior lean and proper weight placement to maintain any balance                    Cognition Arousal/Alertness: Awake/alert Behavior During Therapy: WFL for tasks assessed/performed Overall Cognitive Status: Impaired/Different from baseline Area of Impairment: Following commands;Safety/judgement;Problem solving;Attention   Current Attention Level: Selective   Following Commands: Follows one step commands inconsistently Safety/Judgement: Decreased awareness of safety;Decreased awareness of deficits   Problem Solving: Decreased initiation;Difficulty sequencing;Requires verbal cues;Requires tactile cues;Slow processing General Comments: Pt still somewhat confused but knows that she is going to SNF            Pertinent Vitals/Pain Pain Assessment: Faces Faces Pain Scale: Hurts whole lot Pain Location: L hip, stomach  Pain Descriptors / Indicators: Grimacing;Guarding;Sore;Tender  VSS           PT Goals (current goals can now be found in the care plan section) Acute Rehab PT Goals Patient Stated Goal: go home PT Goal Formulation: With patient/family Time For Goal Achievement: 11/02/16 Potential  to Achieve Goals: Fair Progress towards PT goals:  Progressing toward goals    Frequency    Min 2X/week      PT Plan Current plan remains appropriate    Co-evaluation PT/OT/SLP Co-Evaluation/Treatment: Yes   PT goals addressed during session: Mobility/safety with mobility;Proper use of DME       End of Session Equipment Utilized During Treatment: Gait belt Activity Tolerance: Patient limited by pain;Patient limited by fatigue Patient left: with call bell/phone within reach;with family/visitor present;in bed;with nursing/sitter in room Nurse Communication: Mobility status PT Visit Diagnosis: Unsteadiness on feet (R26.81);History of falling (Z91.81);Other abnormalities of gait and mobility (R26.89);Pain Pain - Right/Left: Left Pain - part of body: Hip     Time: 1215-1228 PT Time Calculation (min) (ACUTE ONLY): 13 min  Charges:  $Therapeutic Activity: 8-22 mins                    G Codes:       Bailey Mech Fleet 10/20/2016, 12:40 PM  Dani Gobble. Migdalia Dk PT, DPT Acute Rehabilitation  [phone] Pager [phone]

## 2016-10-22 DIAGNOSIS — F329 Major depressive disorder, single episode, unspecified: Secondary | ICD-10-CM | POA: Diagnosis not present

## 2016-10-22 DIAGNOSIS — S72142A Displaced intertrochanteric fracture of left femur, initial encounter for closed fracture: Secondary | ICD-10-CM | POA: Diagnosis not present

## 2016-10-22 DIAGNOSIS — I10 Essential (primary) hypertension: Secondary | ICD-10-CM | POA: Diagnosis not present

## 2016-10-22 DIAGNOSIS — E038 Other specified hypothyroidism: Secondary | ICD-10-CM | POA: Diagnosis not present

## 2016-10-22 DIAGNOSIS — R0902 Hypoxemia: Secondary | ICD-10-CM | POA: Diagnosis not present

## 2016-10-22 DIAGNOSIS — F418 Other specified anxiety disorders: Secondary | ICD-10-CM | POA: Diagnosis not present

## 2016-10-22 DIAGNOSIS — E440 Moderate protein-calorie malnutrition: Secondary | ICD-10-CM | POA: Diagnosis not present

## 2016-10-22 DIAGNOSIS — M545 Low back pain: Secondary | ICD-10-CM | POA: Diagnosis not present

## 2016-10-24 ENCOUNTER — Other Ambulatory Visit (HOSPITAL_COMMUNITY): Payer: Self-pay | Admitting: Internal Medicine

## 2016-10-24 DIAGNOSIS — S32010A Wedge compression fracture of first lumbar vertebra, initial encounter for closed fracture: Secondary | ICD-10-CM

## 2016-10-27 ENCOUNTER — Ambulatory Visit (HOSPITAL_COMMUNITY)
Admission: RE | Admit: 2016-10-27 | Discharge: 2016-10-27 | Disposition: A | Discharge status: Home / Self Care | Payer: Medicare Other | Source: Ambulatory Visit | Attending: Internal Medicine | Admitting: Internal Medicine

## 2016-10-27 DIAGNOSIS — S3992XA Unspecified injury of lower back, initial encounter: Secondary | ICD-10-CM | POA: Diagnosis not present

## 2016-10-27 DIAGNOSIS — M4856XA Collapsed vertebra, not elsewhere classified, lumbar region, initial encounter for fracture: Secondary | ICD-10-CM | POA: Insufficient documentation

## 2016-10-27 DIAGNOSIS — S32010A Wedge compression fracture of first lumbar vertebra, initial encounter for closed fracture: Secondary | ICD-10-CM

## 2016-10-27 DIAGNOSIS — M545 Low back pain: Secondary | ICD-10-CM | POA: Diagnosis not present

## 2016-10-29 DIAGNOSIS — S72012D Unspecified intracapsular fracture of left femur, subsequent encounter for closed fracture with routine healing: Secondary | ICD-10-CM | POA: Diagnosis not present

## 2016-10-29 DIAGNOSIS — I10 Essential (primary) hypertension: Secondary | ICD-10-CM | POA: Diagnosis not present

## 2016-10-29 DIAGNOSIS — I82409 Acute embolism and thrombosis of unspecified deep veins of unspecified lower extremity: Secondary | ICD-10-CM | POA: Diagnosis not present

## 2016-10-31 ENCOUNTER — Ambulatory Visit (INDEPENDENT_AMBULATORY_CARE_PROVIDER_SITE_OTHER): Payer: Medicare Other | Admitting: Orthopaedic Surgery

## 2016-10-31 ENCOUNTER — Other Ambulatory Visit (HOSPITAL_COMMUNITY): Payer: Self-pay | Admitting: Interventional Radiology

## 2016-10-31 ENCOUNTER — Encounter (INDEPENDENT_AMBULATORY_CARE_PROVIDER_SITE_OTHER): Payer: Self-pay | Admitting: Orthopaedic Surgery

## 2016-10-31 DIAGNOSIS — M545 Low back pain, unspecified: Secondary | ICD-10-CM | POA: Insufficient documentation

## 2016-10-31 DIAGNOSIS — IMO0001 Reserved for inherently not codable concepts without codable children: Secondary | ICD-10-CM

## 2016-10-31 DIAGNOSIS — G8929 Other chronic pain: Secondary | ICD-10-CM | POA: Diagnosis not present

## 2016-10-31 DIAGNOSIS — M4850XA Collapsed vertebra, not elsewhere classified, site unspecified, initial encounter for fracture: Principal | ICD-10-CM

## 2016-10-31 DIAGNOSIS — S72012A Unspecified intracapsular fracture of left femur, initial encounter for closed fracture: Secondary | ICD-10-CM

## 2016-10-31 NOTE — Progress Notes (Signed)
Office Visit Note   Patient: [name]           Date of Birth: 1921/06/17           MRN: [id number] Visit Date: 10/31/2016              Requested by: Wenda Low, MD [address] [place],  [address] PCP: Wenda Low, MD   Assessment & Plan: Visit Diagnoses:  1. Chronic bilateral low back pain, with sciatica presence unspecified   2. Closed subcapital fracture of femur, left, initial encounter [facility])     Plan: From a hip standpoint I think she is doing fine. She is more likely having an exacerbation of her lumbar radiculopathy. She's had previous injections at L5-S1 multiple times with [facility] imaging. I will try to get her in as soon as possible with either Dr. Ernestina Patches or [facility] imaging for another injection. She does have a newly diagnosed L1 compression fracture which she is supposed to have evaluation for vertebral plasty with interventional radiology too. From a hip standpoint I will see her back in 4 weeks with AP pelvis standing if possible.  Follow-Up Instructions: Return in about 4 weeks (around 11/28/2016).   Orders:  Orders Placed This Encounter  Procedures  . Ambulatory referral to Physical Medicine Rehab   No orders of the defined types were placed in this encounter.     Procedures: No procedures performed   Clinical Data: No additional findings.   Subjective: Chief Complaint  Patient presents with  . Left Hip - Routine Post Op, Follow-up, Pain    Patient is 2 weeks status post partial hip replacement for femoral neck fracture. She is complaining of constant 10 out of 10 pain that radiates from her calf up into her back. She does have history of back surgery. Her hip feels fine. She's been taking tramadol and gabapentin.    Review of Systems  Constitutional: Negative.   HENT: Negative.   Eyes: Negative.   Respiratory: Negative.   Cardiovascular: Negative.   Endocrine: Negative.   Musculoskeletal: Negative.     Neurological: Negative.   Hematological: Negative.   Psychiatric/Behavioral: Negative.   All other systems reviewed and are negative.    Objective: Vital Signs: There were no vitals taken for this visit.  Physical Exam  Constitutional: She is oriented to person, place, and time. She appears well-developed and well-nourished.  Pulmonary/Chest: Effort normal.  Neurological: She is alert and oriented to person, place, and time.  Skin: Skin is warm. Capillary refill takes less than 2 seconds.  Psychiatric: She has a normal mood and affect. Her behavior is normal. Judgment and thought content normal.  Nursing note and vitals reviewed.   Ortho Exam Exam of the left hip shows a well-healed surgical incision. There is minimal swelling. Range of motion of the hip is not painful and does not reproduce her symptoms. She has negative sciatic tension sign. Specialty Comments:  No specialty comments available.  Imaging: No results found.   PMFS History: Patient Active Problem List   Diagnosis Date Noted  . Chronic bilateral low back pain 10/31/2016  . Closed subcapital fracture of femur, left, initial encounter ([place]) 10/16/2016  . Peripheral vascular disease ([place]) 10/16/2016  . Essential hypertension 10/16/2016  . Hypothyroidism, acquired 10/16/2016  . Dry eye 04/21/2015  . H/O surgical procedure 05/25/2014  . 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 Pseudoaphakia 04/10/2014  . Acute appendicitis 08/22/2013   Past Medical History:  Diagnosis Date  .  Anemia   . Anginal pain ([place])   . Arthritis    "all over; doesn't seem to bother me much" (08/22/2013)  . Chronic lower back pain   . Dysrhythmia    "irregular heart beat"   . Exertional shortness of breath   . GERD (gastroesophageal reflux disease)   . Hypertension   . Hypothyroidism   . Migraines    "used to"  . Mixed hyperlipidemia   . Osteoporosis   . Paroxysmal supraventricular tachycardia ([place])   . Skin cancer     "couple taken off right leg" (08/22/2013)    No family history on file.  Past Surgical History:  Procedure Laterality Date  . ABDOMINAL HYSTERECTOMY    . ANTERIOR APPROACH HEMI HIP ARTHROPLASTY Left 10/18/2016   Procedure: ANTERIOR APPROACH LEFT HEMI HIP ARTHROPLASTY;  Surgeon: Leandrew Koyanagi, MD;  Location: [facility];  Service: Orthopedics;  Laterality: Left;  . APPENDECTOMY  08/22/2013  . BACK SURGERY    . CATARACT EXTRACTION W/ INTRAOCULAR LENS  IMPLANT, BILATERAL    . LAPAROSCOPIC APPENDECTOMY N/A 08/22/2013   Procedure: APPENDECTOMY LAPAROSCOPIC;  Surgeon: Imogene Burn. [name], MD;  Location: [facility];  Service: General;  Laterality: N/A;  . LUMBAR [place] SURGERY  2008  . THROAT SURGERY  1940's; ?   "cysts removed"    Social History   Occupational History  . Not on file.   Social History Main Topics  . Smoking status: Never Smoker  . Smokeless tobacco: Never Used  . Alcohol use No  . Drug use: No  . Sexual activity: No

## 2016-11-02 ENCOUNTER — Other Ambulatory Visit (INDEPENDENT_AMBULATORY_CARE_PROVIDER_SITE_OTHER): Payer: Self-pay | Admitting: Orthopaedic Surgery

## 2016-11-02 DIAGNOSIS — M545 Low back pain: Principal | ICD-10-CM

## 2016-11-02 DIAGNOSIS — G8929 Other chronic pain: Secondary | ICD-10-CM

## 2016-11-03 ENCOUNTER — Inpatient Hospital Stay
Admission: RE | Admit: 2016-11-03 | Discharge: 2016-11-03 | Disposition: A | Discharge status: Home / Self Care | Payer: Medicare Other | Source: Ambulatory Visit | Attending: Orthopaedic Surgery | Admitting: Orthopaedic Surgery

## 2016-11-03 ENCOUNTER — Telehealth (INDEPENDENT_AMBULATORY_CARE_PROVIDER_SITE_OTHER): Payer: Self-pay | Admitting: *Deleted

## 2016-11-03 NOTE — Telephone Encounter (Signed)
Received fax from [place] at [facility] imaging with appt scheduled on 11/03/16 at 11:00am at [facility], per fax pt aware of appt.

## 2016-11-06 ENCOUNTER — Ambulatory Visit
Admission: RE | Admit: 2016-11-06 | Discharge: 2016-11-06 | Disposition: A | Discharge status: Home / Self Care | Payer: Medicare Other | Source: Ambulatory Visit | Attending: Orthopaedic Surgery | Admitting: Orthopaedic Surgery

## 2016-11-06 ENCOUNTER — Other Ambulatory Visit (HOSPITAL_COMMUNITY): Payer: Self-pay | Admitting: Internal Medicine

## 2016-11-06 DIAGNOSIS — M47817 Spondylosis without myelopathy or radiculopathy, lumbosacral region: Secondary | ICD-10-CM | POA: Diagnosis not present

## 2016-11-06 DIAGNOSIS — M545 Low back pain: Principal | ICD-10-CM

## 2016-11-06 DIAGNOSIS — G8929 Other chronic pain: Secondary | ICD-10-CM

## 2016-11-06 DIAGNOSIS — S32010A Wedge compression fracture of first lumbar vertebra, initial encounter for closed fracture: Secondary | ICD-10-CM

## 2016-11-06 MED ORDER — METHYLPREDNISOLONE ACETATE 40 MG/ML INJ SUSP (RADIOLOG
120.0000 mg | Freq: Once | INTRAMUSCULAR | Status: AC
  Administered 2016-11-06: 120 mg via EPIDURAL

## 2016-11-06 MED ORDER — IOPAMIDOL (ISOVUE-M 200) INJECTION 41%
1.0000 mL | Freq: Once | INTRAMUSCULAR | Status: AC
  Administered 2016-11-06: 1 mL via EPIDURAL

## 2016-11-06 NOTE — Progress Notes (Signed)
Called [facility] to come take pt back to her facility. Daughter is aware. Juliann Pulse)

## 2016-11-06 NOTE — Discharge Instructions (Signed)

## 2016-11-07 ENCOUNTER — Ambulatory Visit (HOSPITAL_COMMUNITY): Payer: Medicare Other

## 2016-11-11 ENCOUNTER — Ambulatory Visit (HOSPITAL_COMMUNITY): Payer: Medicare Other

## 2016-11-26 DIAGNOSIS — F418 Other specified anxiety disorders: Secondary | ICD-10-CM | POA: Diagnosis not present

## 2016-11-26 DIAGNOSIS — Z86718 Personal history of other venous thrombosis and embolism: Secondary | ICD-10-CM | POA: Diagnosis not present

## 2016-11-26 DIAGNOSIS — Z7901 Long term (current) use of anticoagulants: Secondary | ICD-10-CM | POA: Diagnosis not present

## 2016-11-26 DIAGNOSIS — E039 Hypothyroidism, unspecified: Secondary | ICD-10-CM | POA: Diagnosis not present

## 2016-11-26 DIAGNOSIS — I10 Essential (primary) hypertension: Secondary | ICD-10-CM | POA: Diagnosis not present

## 2016-11-26 DIAGNOSIS — D649 Anemia, unspecified: Secondary | ICD-10-CM | POA: Diagnosis not present

## 2016-11-26 DIAGNOSIS — H409 Unspecified glaucoma: Secondary | ICD-10-CM | POA: Diagnosis not present

## 2016-11-26 DIAGNOSIS — S72012D Unspecified intracapsular fracture of left femur, subsequent encounter for closed fracture with routine healing: Secondary | ICD-10-CM | POA: Diagnosis not present

## 2016-11-26 DIAGNOSIS — I739 Peripheral vascular disease, unspecified: Secondary | ICD-10-CM | POA: Diagnosis not present

## 2016-11-26 DIAGNOSIS — M545 Low back pain: Secondary | ICD-10-CM | POA: Diagnosis not present

## 2016-11-26 DIAGNOSIS — E46 Unspecified protein-calorie malnutrition: Secondary | ICD-10-CM | POA: Diagnosis not present

## 2016-11-28 ENCOUNTER — Telehealth (INDEPENDENT_AMBULATORY_CARE_PROVIDER_SITE_OTHER): Payer: Self-pay

## 2016-11-28 ENCOUNTER — Ambulatory Visit (INDEPENDENT_AMBULATORY_CARE_PROVIDER_SITE_OTHER): Payer: Medicare Other | Admitting: Orthopaedic Surgery

## 2016-11-28 ENCOUNTER — Encounter (INDEPENDENT_AMBULATORY_CARE_PROVIDER_SITE_OTHER): Payer: Self-pay | Admitting: Orthopaedic Surgery

## 2016-11-28 ENCOUNTER — Ambulatory Visit (INDEPENDENT_AMBULATORY_CARE_PROVIDER_SITE_OTHER): Payer: Medicare Other

## 2016-11-28 DIAGNOSIS — S72012D Unspecified intracapsular fracture of left femur, subsequent encounter for closed fracture with routine healing: Secondary | ICD-10-CM | POA: Diagnosis not present

## 2016-11-28 DIAGNOSIS — S72012A Unspecified intracapsular fracture of left femur, initial encounter for closed fracture: Secondary | ICD-10-CM

## 2016-11-28 DIAGNOSIS — M545 Low back pain: Secondary | ICD-10-CM | POA: Diagnosis not present

## 2016-11-28 NOTE — Addendum Note (Signed)
Addended by: Precious Bard on: 11/28/2016 03:53 PM   Modules accepted: Orders

## 2016-11-28 NOTE — Telephone Encounter (Signed)
Called Don and advised on message

## 2016-11-28 NOTE — Telephone Encounter (Signed)
Caprice Red OT with [facility] called needing clarification on post op hip precautions for patient. Please call him back to advise [phone]

## 2016-11-28 NOTE — Telephone Encounter (Signed)
She does not have any hip precautions. Her surgery was done through an anterior approach

## 2016-11-28 NOTE — Progress Notes (Signed)
Patient is 6 weeks status post left hip hemiarthroplasty. She is doing well and just recently left her skilled nursing facility. She is currently on Xarelto 15 mg daily that was started by her nursing facility. From my standpoint she no longer needs to be on any anticoagulation therefore I do want him to see her primary care doctor to see if she needs to be on anything at all. She used to be on Plavix every other day. X-rays today show good alignment of the partial hip replacement. Her left leg is slightly longer. I will like her gait normalizes little bit more to see if this is still significant difference in 6 weeks. If so we can consider getting her shoe lift [employer]. Otherwise I'll see her back in 6 weeks for recheck. No x-rays needed. I did make another referral to [facility] imaging for epidural steroid injection

## 2016-11-28 NOTE — Telephone Encounter (Signed)
Please advise 

## 2016-11-29 ENCOUNTER — Telehealth (INDEPENDENT_AMBULATORY_CARE_PROVIDER_SITE_OTHER): Payer: Self-pay | Admitting: *Deleted

## 2016-11-29 DIAGNOSIS — S72012D Unspecified intracapsular fracture of left femur, subsequent encounter for closed fracture with routine healing: Secondary | ICD-10-CM | POA: Diagnosis not present

## 2016-11-29 DIAGNOSIS — M545 Low back pain: Secondary | ICD-10-CM | POA: Diagnosis not present

## 2016-11-29 NOTE — Telephone Encounter (Signed)
I would ask the ordering provider if MRI is necessary before her ESI

## 2016-11-29 NOTE — Telephone Encounter (Signed)
Angelita Ingles called stating pt was scheduled for MRI to evaluate for fracture for vertrabral plasty but was cancelled, but wanted to know if she is suppose to have this before the ESI (ordered by another provider). Per Angelita Ingles just had injection on 03/19. Please advise as to what needs to be done so that pt can be called to [facility].

## 2016-11-30 ENCOUNTER — Other Ambulatory Visit (INDEPENDENT_AMBULATORY_CARE_PROVIDER_SITE_OTHER): Payer: Self-pay | Admitting: Orthopaedic Surgery

## 2016-11-30 DIAGNOSIS — S72012D Unspecified intracapsular fracture of left femur, subsequent encounter for closed fracture with routine healing: Secondary | ICD-10-CM | POA: Diagnosis not present

## 2016-11-30 DIAGNOSIS — M545 Low back pain: Principal | ICD-10-CM

## 2016-11-30 DIAGNOSIS — G8929 Other chronic pain: Secondary | ICD-10-CM

## 2016-11-30 NOTE — Telephone Encounter (Signed)
Called and left message with Angelita Ingles at [facility] imaging to return my call

## 2016-11-30 NOTE — Telephone Encounter (Signed)
s/w Roberta at [facility] imaging discussed this order and is going ot go ahead and schedule pt for ESI.

## 2016-12-01 DIAGNOSIS — E039 Hypothyroidism, unspecified: Secondary | ICD-10-CM | POA: Diagnosis not present

## 2016-12-01 DIAGNOSIS — I10 Essential (primary) hypertension: Secondary | ICD-10-CM | POA: Diagnosis not present

## 2016-12-01 DIAGNOSIS — F324 Major depressive disorder, single episode, in partial remission: Secondary | ICD-10-CM | POA: Diagnosis not present

## 2016-12-01 DIAGNOSIS — R269 Unspecified abnormalities of gait and mobility: Secondary | ICD-10-CM | POA: Diagnosis not present

## 2016-12-01 DIAGNOSIS — I471 Supraventricular tachycardia: Secondary | ICD-10-CM | POA: Diagnosis not present

## 2016-12-01 DIAGNOSIS — G450 Vertebro-basilar artery syndrome: Secondary | ICD-10-CM | POA: Diagnosis not present

## 2016-12-01 DIAGNOSIS — N183 Chronic kidney disease, stage 3 (moderate): Secondary | ICD-10-CM | POA: Diagnosis not present

## 2016-12-04 DIAGNOSIS — S72012D Unspecified intracapsular fracture of left femur, subsequent encounter for closed fracture with routine healing: Secondary | ICD-10-CM | POA: Diagnosis not present

## 2016-12-04 DIAGNOSIS — M545 Low back pain: Secondary | ICD-10-CM | POA: Diagnosis not present

## 2016-12-05 ENCOUNTER — Ambulatory Visit
Admission: RE | Admit: 2016-12-05 | Discharge: 2016-12-05 | Disposition: A | Discharge status: Home / Self Care | Payer: Medicare Other | Source: Ambulatory Visit | Attending: Orthopaedic Surgery | Admitting: Orthopaedic Surgery

## 2016-12-05 VITALS — BP 188/74 | HR 57

## 2016-12-05 DIAGNOSIS — M545 Low back pain: Secondary | ICD-10-CM | POA: Diagnosis not present

## 2016-12-05 DIAGNOSIS — G8929 Other chronic pain: Secondary | ICD-10-CM

## 2016-12-05 DIAGNOSIS — M5442 Lumbago with sciatica, left side: Principal | ICD-10-CM

## 2016-12-05 MED ORDER — IOPAMIDOL (ISOVUE-M 200) INJECTION 41%
1.0000 mL | Freq: Once | INTRAMUSCULAR | Status: AC
  Administered 2016-12-05: 1 mL via EPIDURAL

## 2016-12-05 MED ORDER — METHYLPREDNISOLONE ACETATE 40 MG/ML INJ SUSP (RADIOLOG
120.0000 mg | Freq: Once | INTRAMUSCULAR | Status: AC
  Administered 2016-12-05: 120 mg via EPIDURAL

## 2016-12-05 NOTE — Discharge Instructions (Signed)

## 2016-12-06 DIAGNOSIS — M545 Low back pain: Secondary | ICD-10-CM | POA: Diagnosis not present

## 2016-12-06 DIAGNOSIS — S72012D Unspecified intracapsular fracture of left femur, subsequent encounter for closed fracture with routine healing: Secondary | ICD-10-CM | POA: Diagnosis not present

## 2016-12-07 DIAGNOSIS — S72012D Unspecified intracapsular fracture of left femur, subsequent encounter for closed fracture with routine healing: Secondary | ICD-10-CM | POA: Diagnosis not present

## 2016-12-07 DIAGNOSIS — M545 Low back pain: Secondary | ICD-10-CM | POA: Diagnosis not present

## 2016-12-11 DIAGNOSIS — M545 Low back pain: Secondary | ICD-10-CM | POA: Diagnosis not present

## 2016-12-11 DIAGNOSIS — S72012D Unspecified intracapsular fracture of left femur, subsequent encounter for closed fracture with routine healing: Secondary | ICD-10-CM | POA: Diagnosis not present

## 2016-12-12 DIAGNOSIS — M545 Low back pain: Secondary | ICD-10-CM | POA: Diagnosis not present

## 2016-12-12 DIAGNOSIS — S72012D Unspecified intracapsular fracture of left femur, subsequent encounter for closed fracture with routine healing: Secondary | ICD-10-CM | POA: Diagnosis not present

## 2016-12-13 DIAGNOSIS — M545 Low back pain: Secondary | ICD-10-CM | POA: Diagnosis not present

## 2016-12-13 DIAGNOSIS — S72012D Unspecified intracapsular fracture of left femur, subsequent encounter for closed fracture with routine healing: Secondary | ICD-10-CM | POA: Diagnosis not present

## 2016-12-18 DIAGNOSIS — M545 Low back pain: Secondary | ICD-10-CM | POA: Diagnosis not present

## 2016-12-18 DIAGNOSIS — S72012D Unspecified intracapsular fracture of left femur, subsequent encounter for closed fracture with routine healing: Secondary | ICD-10-CM | POA: Diagnosis not present

## 2016-12-20 DIAGNOSIS — M545 Low back pain: Secondary | ICD-10-CM | POA: Diagnosis not present

## 2016-12-20 DIAGNOSIS — S72012D Unspecified intracapsular fracture of left femur, subsequent encounter for closed fracture with routine healing: Secondary | ICD-10-CM | POA: Diagnosis not present

## 2016-12-21 DIAGNOSIS — S72012D Unspecified intracapsular fracture of left femur, subsequent encounter for closed fracture with routine healing: Secondary | ICD-10-CM | POA: Diagnosis not present

## 2016-12-21 DIAGNOSIS — M545 Low back pain: Secondary | ICD-10-CM | POA: Diagnosis not present

## 2017-01-09 ENCOUNTER — Ambulatory Visit (INDEPENDENT_AMBULATORY_CARE_PROVIDER_SITE_OTHER): Payer: Medicare Other | Admitting: Orthopaedic Surgery

## 2017-01-16 ENCOUNTER — Ambulatory Visit (INDEPENDENT_AMBULATORY_CARE_PROVIDER_SITE_OTHER): Payer: Medicare Other | Admitting: Orthopaedic Surgery

## 2017-01-16 DIAGNOSIS — S72012A Unspecified intracapsular fracture of left femur, initial encounter for closed fracture: Secondary | ICD-10-CM | POA: Diagnosis not present

## 2017-01-16 DIAGNOSIS — M545 Low back pain: Secondary | ICD-10-CM | POA: Diagnosis not present

## 2017-01-16 DIAGNOSIS — G8929 Other chronic pain: Secondary | ICD-10-CM

## 2017-01-16 NOTE — Progress Notes (Signed)
Office Visit Note   Patient: [name]           Date of Birth: 03-Nov-1920           MRN: [id number] Visit Date: 01/16/2017              Requested by: Wenda Low, MD [place] [employer] [address], [place] [address] PCP: Wenda Low, MD   Assessment & Plan: Visit Diagnoses:  1. Closed subcapital fracture of femur, left, initial encounter ([place])   2. Chronic bilateral low back pain, with sciatica presence unspecified     Plan: Patient is doing very well from a fracture standpoint she is ambulating with a walker now. Her complaint is mainly from her back. We'll schedule her with Dr. Ernestina Patches for epidural steroid injection. Her last MRI was done in 2014 so if she does not get good relief may consider another MRI. From a hip standpoint all see her back as needed.  Follow-Up Instructions: Return if symptoms worsen or fail to improve.   Orders:  Orders Placed This Encounter  Procedures  . Ambulatory referral to Physical Medicine Rehab   No orders of the defined types were placed in this encounter.     Procedures: No procedures performed   Clinical Data: No additional findings.   Subjective: Chief Complaint  Patient presents with  . Left Hip - Follow-up    Patient is 3 months status post left hip hemiarthroplasty. She denies any pain in her hip. She is mainly complaining of back and radicular pain. She's had injections in her back in the past with good but temporary relief. Her previous one was with [facility] imaging needed April. She said she had really good relief with this did not last very long.    Review of Systems   Objective: Vital Signs: There were no vitals taken for this visit.  Physical Exam  Ortho Exam Left hip exam is stable. No pain with rotation of the hip. Specialty Comments:  No specialty comments available.  Imaging: No results found.   PMFS History: Patient Active Problem List   Diagnosis Date Noted  . Chronic  bilateral low back pain 10/31/2016  . Closed subcapital fracture of femur, left, initial encounter ([place]) 10/16/2016  . Peripheral vascular disease ([place]) 10/16/2016  . Essential hypertension 10/16/2016  . Hypothyroidism, acquired 10/16/2016  . Dry eye 04/21/2015  . H/O surgical procedure 05/25/2014  . 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 Pseudoaphakia 04/10/2014  . Acute appendicitis 08/22/2013   Past Medical History:  Diagnosis Date  . Anemia   . Anginal pain ([place])   . Arthritis    "all over; doesn't seem to bother me much" (08/22/2013)  . Chronic lower back pain   . Dysrhythmia    "irregular heart beat"   . Exertional shortness of breath   . GERD (gastroesophageal reflux disease)   . Hypertension   . Hypothyroidism   . Migraines    "used to"  . Mixed hyperlipidemia   . Osteoporosis   . Paroxysmal supraventricular tachycardia ([place])   . Skin cancer    "couple taken off right leg" (08/22/2013)    No family history on file.  Past Surgical History:  Procedure Laterality Date  . ABDOMINAL HYSTERECTOMY    . ANTERIOR APPROACH HEMI HIP ARTHROPLASTY Left 10/18/2016   Procedure: ANTERIOR APPROACH LEFT HEMI HIP ARTHROPLASTY;  Surgeon: Leandrew Koyanagi, MD;  Location: [facility];  Service: Orthopedics;  Laterality: Left;  . APPENDECTOMY  08/22/2013  .  BACK SURGERY    . CATARACT EXTRACTION W/ INTRAOCULAR LENS  IMPLANT, BILATERAL    . LAPAROSCOPIC APPENDECTOMY N/A 08/22/2013   Procedure: APPENDECTOMY LAPAROSCOPIC;  Surgeon: Imogene Burn. [name], MD;  Location: [facility];  Service: General;  Laterality: N/A;  . LUMBAR [place] SURGERY  2008  . THROAT SURGERY  1940's; ?   "cysts removed"    Social History   Occupational History  . Not on file.   Social History Main Topics  . Smoking status: Never Smoker  . Smokeless tobacco: Never Used  . Alcohol use No  . Drug use: No  . Sexual activity: No

## 2017-01-31 DIAGNOSIS — M549 Dorsalgia, unspecified: Secondary | ICD-10-CM | POA: Diagnosis not present

## 2017-01-31 DIAGNOSIS — R269 Unspecified abnormalities of gait and mobility: Secondary | ICD-10-CM | POA: Diagnosis not present

## 2017-01-31 DIAGNOSIS — R531 Weakness: Secondary | ICD-10-CM | POA: Diagnosis not present

## 2017-01-31 DIAGNOSIS — R410 Disorientation, unspecified: Secondary | ICD-10-CM | POA: Diagnosis not present

## 2017-02-01 ENCOUNTER — Encounter (INDEPENDENT_AMBULATORY_CARE_PROVIDER_SITE_OTHER): Payer: Medicare Other | Admitting: Physical Medicine and Rehabilitation

## 2017-02-01 DIAGNOSIS — I129 Hypertensive chronic kidney disease with stage 1 through stage 4 chronic kidney disease, or unspecified chronic kidney disease: Secondary | ICD-10-CM | POA: Diagnosis not present

## 2017-02-01 DIAGNOSIS — E039 Hypothyroidism, unspecified: Secondary | ICD-10-CM | POA: Diagnosis not present

## 2017-02-01 DIAGNOSIS — R2681 Unsteadiness on feet: Secondary | ICD-10-CM | POA: Diagnosis not present

## 2017-02-01 DIAGNOSIS — I470 Re-entry ventricular arrhythmia: Secondary | ICD-10-CM | POA: Diagnosis not present

## 2017-02-01 DIAGNOSIS — N183 Chronic kidney disease, stage 3 (moderate): Secondary | ICD-10-CM | POA: Diagnosis not present

## 2017-02-01 DIAGNOSIS — F329 Major depressive disorder, single episode, unspecified: Secondary | ICD-10-CM | POA: Diagnosis not present

## 2017-02-01 DIAGNOSIS — M4800 Spinal stenosis, site unspecified: Secondary | ICD-10-CM | POA: Diagnosis not present

## 2017-02-01 DIAGNOSIS — M5416 Radiculopathy, lumbar region: Secondary | ICD-10-CM | POA: Diagnosis not present

## 2017-02-01 DIAGNOSIS — G450 Vertebro-basilar artery syndrome: Secondary | ICD-10-CM | POA: Diagnosis not present

## 2017-02-01 DIAGNOSIS — R4189 Other symptoms and signs involving cognitive functions and awareness: Secondary | ICD-10-CM | POA: Diagnosis not present

## 2017-02-01 DIAGNOSIS — M1991 Primary osteoarthritis, unspecified site: Secondary | ICD-10-CM | POA: Diagnosis not present

## 2017-02-01 DIAGNOSIS — N390 Urinary tract infection, site not specified: Secondary | ICD-10-CM | POA: Diagnosis not present

## 2017-02-05 DIAGNOSIS — N390 Urinary tract infection, site not specified: Secondary | ICD-10-CM | POA: Diagnosis not present

## 2017-02-05 DIAGNOSIS — M5416 Radiculopathy, lumbar region: Secondary | ICD-10-CM | POA: Diagnosis not present

## 2017-02-07 DIAGNOSIS — H401422 Capsular glaucoma with pseudoexfoliation of lens, left eye, moderate stage: Secondary | ICD-10-CM | POA: Diagnosis not present

## 2017-02-07 DIAGNOSIS — H401413 Capsular glaucoma with pseudoexfoliation of lens, right eye, severe stage: Secondary | ICD-10-CM | POA: Diagnosis not present

## 2017-02-07 DIAGNOSIS — Z961 Presence of intraocular lens: Secondary | ICD-10-CM | POA: Diagnosis not present

## 2017-02-08 DIAGNOSIS — M5416 Radiculopathy, lumbar region: Secondary | ICD-10-CM | POA: Diagnosis not present

## 2017-02-08 DIAGNOSIS — N390 Urinary tract infection, site not specified: Secondary | ICD-10-CM | POA: Diagnosis not present

## 2017-02-12 DIAGNOSIS — M5416 Radiculopathy, lumbar region: Secondary | ICD-10-CM | POA: Diagnosis not present

## 2017-02-12 DIAGNOSIS — N390 Urinary tract infection, site not specified: Secondary | ICD-10-CM | POA: Diagnosis not present

## 2017-02-15 DIAGNOSIS — M5416 Radiculopathy, lumbar region: Secondary | ICD-10-CM | POA: Diagnosis not present

## 2017-02-15 DIAGNOSIS — N390 Urinary tract infection, site not specified: Secondary | ICD-10-CM | POA: Diagnosis not present

## 2017-02-19 DIAGNOSIS — N390 Urinary tract infection, site not specified: Secondary | ICD-10-CM | POA: Diagnosis not present

## 2017-02-19 DIAGNOSIS — M5416 Radiculopathy, lumbar region: Secondary | ICD-10-CM | POA: Diagnosis not present

## 2017-02-20 DIAGNOSIS — M5116 Intervertebral disc disorders with radiculopathy, lumbar region: Secondary | ICD-10-CM | POA: Diagnosis not present

## 2017-02-20 DIAGNOSIS — R269 Unspecified abnormalities of gait and mobility: Secondary | ICD-10-CM | POA: Diagnosis not present

## 2017-02-22 ENCOUNTER — Other Ambulatory Visit: Payer: Self-pay | Admitting: Internal Medicine

## 2017-02-22 DIAGNOSIS — N390 Urinary tract infection, site not specified: Secondary | ICD-10-CM | POA: Diagnosis not present

## 2017-02-22 DIAGNOSIS — M5416 Radiculopathy, lumbar region: Secondary | ICD-10-CM | POA: Diagnosis not present

## 2017-02-22 DIAGNOSIS — M5116 Intervertebral disc disorders with radiculopathy, lumbar region: Secondary | ICD-10-CM

## 2017-02-26 DIAGNOSIS — N390 Urinary tract infection, site not specified: Secondary | ICD-10-CM | POA: Diagnosis not present

## 2017-02-26 DIAGNOSIS — M5416 Radiculopathy, lumbar region: Secondary | ICD-10-CM | POA: Diagnosis not present

## 2017-02-27 DIAGNOSIS — N390 Urinary tract infection, site not specified: Secondary | ICD-10-CM | POA: Diagnosis not present

## 2017-02-27 DIAGNOSIS — M5416 Radiculopathy, lumbar region: Secondary | ICD-10-CM | POA: Diagnosis not present

## 2017-02-28 ENCOUNTER — Inpatient Hospital Stay
Admission: RE | Admit: 2017-02-28 | Discharge: 2017-02-28 | Disposition: A | Discharge status: Home / Self Care | Payer: Medicare Other | Source: Ambulatory Visit | Attending: Internal Medicine | Admitting: Internal Medicine

## 2017-03-01 DIAGNOSIS — M5416 Radiculopathy, lumbar region: Secondary | ICD-10-CM | POA: Diagnosis not present

## 2017-03-01 DIAGNOSIS — N390 Urinary tract infection, site not specified: Secondary | ICD-10-CM | POA: Diagnosis not present

## 2017-03-26 DIAGNOSIS — N390 Urinary tract infection, site not specified: Secondary | ICD-10-CM | POA: Diagnosis not present

## 2017-03-26 DIAGNOSIS — R5383 Other fatigue: Secondary | ICD-10-CM | POA: Diagnosis not present

## 2017-04-02 DIAGNOSIS — G450 Vertebro-basilar artery syndrome: Secondary | ICD-10-CM | POA: Diagnosis not present

## 2017-04-02 DIAGNOSIS — I10 Essential (primary) hypertension: Secondary | ICD-10-CM | POA: Diagnosis not present

## 2017-04-02 DIAGNOSIS — E46 Unspecified protein-calorie malnutrition: Secondary | ICD-10-CM | POA: Diagnosis not present

## 2017-04-02 DIAGNOSIS — M519 Unspecified thoracic, thoracolumbar and lumbosacral intervertebral disc disorder: Secondary | ICD-10-CM | POA: Diagnosis not present

## 2017-04-02 DIAGNOSIS — E782 Mixed hyperlipidemia: Secondary | ICD-10-CM | POA: Diagnosis not present

## 2017-04-02 DIAGNOSIS — E039 Hypothyroidism, unspecified: Secondary | ICD-10-CM | POA: Diagnosis not present

## 2017-04-02 DIAGNOSIS — I471 Supraventricular tachycardia: Secondary | ICD-10-CM | POA: Diagnosis not present

## 2017-04-02 DIAGNOSIS — N183 Chronic kidney disease, stage 3 (moderate): Secondary | ICD-10-CM | POA: Diagnosis not present

## 2017-04-02 DIAGNOSIS — K219 Gastro-esophageal reflux disease without esophagitis: Secondary | ICD-10-CM | POA: Diagnosis not present

## 2017-05-14 DIAGNOSIS — R300 Dysuria: Secondary | ICD-10-CM | POA: Diagnosis not present

## 2017-05-14 DIAGNOSIS — I10 Essential (primary) hypertension: Secondary | ICD-10-CM | POA: Diagnosis not present

## 2017-05-23 DIAGNOSIS — G459 Transient cerebral ischemic attack, unspecified: Secondary | ICD-10-CM | POA: Diagnosis not present

## 2017-07-04 DIAGNOSIS — R531 Weakness: Secondary | ICD-10-CM | POA: Diagnosis not present

## 2017-07-16 DIAGNOSIS — R531 Weakness: Secondary | ICD-10-CM | POA: Diagnosis not present

## 2017-07-16 DIAGNOSIS — R296 Repeated falls: Secondary | ICD-10-CM | POA: Diagnosis not present

## 2017-07-17 DIAGNOSIS — R296 Repeated falls: Secondary | ICD-10-CM | POA: Diagnosis not present

## 2017-07-17 DIAGNOSIS — R531 Weakness: Secondary | ICD-10-CM | POA: Diagnosis not present

## 2017-07-18 DIAGNOSIS — R531 Weakness: Secondary | ICD-10-CM | POA: Diagnosis not present

## 2017-07-18 DIAGNOSIS — R296 Repeated falls: Secondary | ICD-10-CM | POA: Diagnosis not present

## 2017-07-23 DIAGNOSIS — R296 Repeated falls: Secondary | ICD-10-CM | POA: Diagnosis not present

## 2017-07-23 DIAGNOSIS — R531 Weakness: Secondary | ICD-10-CM | POA: Diagnosis not present

## 2017-07-24 DIAGNOSIS — R296 Repeated falls: Secondary | ICD-10-CM | POA: Diagnosis not present

## 2017-07-24 DIAGNOSIS — R531 Weakness: Secondary | ICD-10-CM | POA: Diagnosis not present

## 2017-07-25 DIAGNOSIS — R296 Repeated falls: Secondary | ICD-10-CM | POA: Diagnosis not present

## 2017-07-25 DIAGNOSIS — R531 Weakness: Secondary | ICD-10-CM | POA: Diagnosis not present

## 2017-07-26 DIAGNOSIS — R531 Weakness: Secondary | ICD-10-CM | POA: Diagnosis not present

## 2017-07-26 DIAGNOSIS — R296 Repeated falls: Secondary | ICD-10-CM | POA: Diagnosis not present

## 2017-08-02 DIAGNOSIS — R296 Repeated falls: Secondary | ICD-10-CM | POA: Diagnosis not present

## 2017-08-02 DIAGNOSIS — R531 Weakness: Secondary | ICD-10-CM | POA: Diagnosis not present

## 2017-08-08 DIAGNOSIS — R531 Weakness: Secondary | ICD-10-CM | POA: Diagnosis not present

## 2017-08-08 DIAGNOSIS — R296 Repeated falls: Secondary | ICD-10-CM | POA: Diagnosis not present

## 2017-08-10 DIAGNOSIS — R296 Repeated falls: Secondary | ICD-10-CM | POA: Diagnosis not present

## 2017-08-10 DIAGNOSIS — R531 Weakness: Secondary | ICD-10-CM | POA: Diagnosis not present

## 2017-08-23 DIAGNOSIS — R300 Dysuria: Secondary | ICD-10-CM | POA: Diagnosis not present

## 2017-08-23 DIAGNOSIS — R05 Cough: Secondary | ICD-10-CM | POA: Diagnosis not present

## 2017-08-30 ENCOUNTER — Ambulatory Visit
Admission: RE | Admit: 2017-08-30 | Discharge: 2017-08-30 | Disposition: A | Discharge status: Home / Self Care | Payer: Medicare Other | Source: Ambulatory Visit | Attending: Internal Medicine | Admitting: Internal Medicine

## 2017-08-30 ENCOUNTER — Other Ambulatory Visit: Payer: Self-pay | Admitting: Internal Medicine

## 2017-08-30 DIAGNOSIS — R05 Cough: Secondary | ICD-10-CM

## 2017-08-30 DIAGNOSIS — R059 Cough, unspecified: Secondary | ICD-10-CM

## 2017-08-30 DIAGNOSIS — J189 Pneumonia, unspecified organism: Secondary | ICD-10-CM | POA: Diagnosis not present

## 2017-10-09 DIAGNOSIS — I10 Essential (primary) hypertension: Secondary | ICD-10-CM | POA: Diagnosis not present

## 2017-10-09 DIAGNOSIS — K219 Gastro-esophageal reflux disease without esophagitis: Secondary | ICD-10-CM | POA: Diagnosis not present

## 2017-10-09 DIAGNOSIS — R269 Unspecified abnormalities of gait and mobility: Secondary | ICD-10-CM | POA: Diagnosis not present

## 2017-10-09 DIAGNOSIS — F324 Major depressive disorder, single episode, in partial remission: Secondary | ICD-10-CM | POA: Diagnosis not present

## 2017-10-09 DIAGNOSIS — Z1389 Encounter for screening for other disorder: Secondary | ICD-10-CM | POA: Diagnosis not present

## 2017-10-09 DIAGNOSIS — E039 Hypothyroidism, unspecified: Secondary | ICD-10-CM | POA: Diagnosis not present

## 2017-10-09 DIAGNOSIS — M1990 Unspecified osteoarthritis, unspecified site: Secondary | ICD-10-CM | POA: Diagnosis not present

## 2017-10-09 DIAGNOSIS — I471 Supraventricular tachycardia: Secondary | ICD-10-CM | POA: Diagnosis not present

## 2017-10-09 DIAGNOSIS — H8110 Benign paroxysmal vertigo, unspecified ear: Secondary | ICD-10-CM | POA: Diagnosis not present

## 2017-10-09 DIAGNOSIS — G450 Vertebro-basilar artery syndrome: Secondary | ICD-10-CM | POA: Diagnosis not present

## 2017-10-09 DIAGNOSIS — Z0000 Encounter for general adult medical examination without abnormal findings: Secondary | ICD-10-CM | POA: Diagnosis not present

## 2017-10-09 DIAGNOSIS — N183 Chronic kidney disease, stage 3 (moderate): Secondary | ICD-10-CM | POA: Diagnosis not present

## 2017-10-09 DIAGNOSIS — E782 Mixed hyperlipidemia: Secondary | ICD-10-CM | POA: Diagnosis not present

## 2017-10-09 DIAGNOSIS — M810 Age-related osteoporosis without current pathological fracture: Secondary | ICD-10-CM | POA: Diagnosis not present

## 2017-11-14 DIAGNOSIS — H01024 Squamous blepharitis left upper eyelid: Secondary | ICD-10-CM | POA: Diagnosis not present

## 2017-11-14 DIAGNOSIS — H401421 Capsular glaucoma with pseudoexfoliation of lens, left eye, mild stage: Secondary | ICD-10-CM | POA: Diagnosis not present

## 2017-11-14 DIAGNOSIS — H401413 Capsular glaucoma with pseudoexfoliation of lens, right eye, severe stage: Secondary | ICD-10-CM | POA: Diagnosis not present

## 2017-11-14 DIAGNOSIS — H01021 Squamous blepharitis right upper eyelid: Secondary | ICD-10-CM | POA: Diagnosis not present

## 2017-12-10 ENCOUNTER — Telehealth: Payer: Self-pay | Admitting: Licensed Clinical Social Worker

## 2017-12-10 NOTE — Telephone Encounter (Signed)
Palliative Care SW left a voicemail with pt's daughter, [name], to set up a visit.

## 2017-12-19 ENCOUNTER — Telehealth: Payer: Self-pay | Admitting: Licensed Clinical Social Worker

## 2017-12-19 NOTE — Telephone Encounter (Signed)
Palliative Care SW left a vm with patient's daughter, [name], and asked to schedule a home visit next week.

## 2017-12-26 ENCOUNTER — Telehealth: Payer: Self-pay | Admitting: Licensed Clinical Social Worker

## 2017-12-26 NOTE — Telephone Encounter (Signed)
Palliative Care SW received a vm from patient's daughter, [name], that patient has private caregivers from 9a-3p and that the team could visit anytime during those hours.  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

## 2018-01-02 ENCOUNTER — Other Ambulatory Visit: Payer: Medicare Other | Admitting: *Deleted

## 2018-01-02 ENCOUNTER — Other Ambulatory Visit: Payer: Medicare Other | Admitting: Licensed Clinical Social Worker

## 2018-01-02 ENCOUNTER — Encounter: Payer: Self-pay | Admitting: *Deleted

## 2018-01-02 DIAGNOSIS — Z515 Encounter for palliative care: Secondary | ICD-10-CM

## 2018-01-02 NOTE — Progress Notes (Signed)
COMMUNITY PALLIATIVE CARE SW NOTE  PATIENT NAME: Marissa ROSELLI DOB: Dec 11, 1920 MRN: [id number]  PRIMARY CARE PROVIDER: Wenda Low, MD  RESPONSIBLE PARTY:  Acct ID - Guarantor Home Phone Work Phone Relationship Acct Type  [id number] [name]  Self P/F     [address], [place] [address]     PLAN OF CARE and INTERVENTIONS:             1. GOALS OF CARE/ ADVANCE CARE PLANNING:  Patient wishes to remain at home with no hospitalizations if possible. 2. SOCIAL/EMOTIONAL/SPIRITUAL ASSESSMENT/ INTERVENTIONS:  Patient is a 84 y/o widowed caucasian female who resides in her home.  She has caregivers most of the time.  Patient was born in [place], [place], and raised in [place], [place].  She married and has two daughters, Eustaquio Maize and Waseca.  [name] lives next door to patient and is very involved in her care.  SW and Palliative Care RN, Daryl Eastern, met with patient and her private caregiver, [name].  Patient is able to answer questions appropriately and was oriented x4.  She is able to dress herself and feed herself.  Her caregivers help with showers.  Patient stated she does not eat as much as she used to.  Patient is a member of [employer].  She attends church periodically and church members visit her at home.  Patient reports sleeping well at night with short naps during the day.  She said she occasionally experiences pain and will take Tylenol which is effective.  She enjoys jigsaw puzzles and watching tv.  Patient has a DNR on her refrigerator.  Provided MOST form for the family and patient to review. 3. PATIENT/CAREGIVER EDUCATION/ COPING:  Patient copes by expressing herself openly.  She depends on her caregivers at times to recall information.  4. COMMUNITY RESOURCES COORDINATION/ HEALTH CARE NAVIGATION:  None per patient or caregiver. 5. FINANCIAL/LEGAL CONCERNS/INTERVENTIONS:  None per patient or caregiver.     SOCIAL HX:  Social History   Tobacco Use  . Smoking status:  Never Smoker  . Smokeless tobacco: Never Used  Substance Use Topics  . Alcohol use: No    CODE STATUS: DNR ADVANCED DIRECTIVES: Living Will MOST FORM COMPLETE:  No.   HOSPICE EDUCATION PROVIDED: Not at this time.  PPS:  Patient is able to stand and ambulate with a walker independently.  Intake is normal. Duration of visit and documentation:  90 minutes.      Creola Corn Adreana Coull, LCSW

## 2018-01-02 NOTE — Progress Notes (Signed)
COMMUNITY PALLIATIVE CARE RN NOTE  PATIENT NAME: [name] DOB: 02/25/1921 MRN: [id number]  PRIMARY CARE PROVIDER: Wenda Low, MD  RESPONSIBLE PARTY:  Acct ID - Guarantor Home Phone Work Phone Relationship Acct Type  [id number] [name]  Self P/F     [address], [place], [place] [address]     PLAN OF CARE and INTERVENTION:  1. ADVANCE CARE PLANNING/GOALS OF CARE: Remain at home with caregivers, Avoid hospitalizations if possible 2. PATIENT/CAREGIVER EDUCATION: Explained Palliative Care services, Reinforced Safety/Fall Precautions 3. DISEASE STATUS: Joint visit made with [place]. Caregiver [name] present during visit. Patient sitting up on her couch awake and alert. Denies pain. Reports occasional generalized pain. Tylenol effective. Patient is able to transfer to her wheelchair without assistance and propel herself to and from the bathroom. Able to ambulate using a walker, but for fall prevention patient only performs this task when someone is present.. Requires 1 person assist with showers. Able to dress self. Has hired caregivers 24/7. Caregiver takes patient to beauty shop every Friday. Intake is 3 meals a day of small portion sizes. Drinks Ensure for nutritional supplementation. Still attends church on Sundays at times. Sleeps well during the night. Takes "cat naps" throughout the day. Caregiver has not noticed any increase in sleeping. Denies issues of constipation with use of stool softeners/laxatives. Last BM this am.   HISTORY OF PRESENT ILLNESS:  This is a 84 yo female who is currently being followed by [facility]. Patient completed PT/OT several months ago s/p L hip fracture and has progressed well. Palliative care to continue to follow patient to assess overall condition and assist with any symptom management needs. Next visit in 1 month.  CODE STATUS: DNR  ADVANCED DIRECTIVES: Y (Daughter is HCPOA) MOST FORM: no (Form left in  the home for daughter to review) PPS: 40%   PHYSICAL EXAM:   VITALS: Today's Vitals   01/02/18 1632  BP: 138/70  Pulse: (!) 51  Resp: 14  SpO2: 96%  PainSc: 0-No pain    LUNGS: clear to auscultation  CARDIAC: Cor Brady, regular rate EXTREMITIES: Trace edema in bilateral ankles, L>R SKIN: Thin, frail skin: Exposed skin intact  NEURO: alert and oriented x 3, forgetful, pleasant and engaging   (Duration of visit and documentation 90 minutes)    Daryl Eastern, RN, BSN

## 2018-01-31 ENCOUNTER — Other Ambulatory Visit: Payer: Medicare Other | Admitting: Licensed Clinical Social Worker

## 2018-01-31 ENCOUNTER — Other Ambulatory Visit: Payer: Medicare Other | Admitting: *Deleted

## 2018-01-31 DIAGNOSIS — Z515 Encounter for palliative care: Secondary | ICD-10-CM

## 2018-01-31 NOTE — Progress Notes (Signed)
COMMUNITY PALLIATIVE CARE SW NOTE  PATIENT NAME: [name] DOB: November 09, 1920 MRN: [id number]  PRIMARY CARE PROVIDER: Wenda Low, MD  RESPONSIBLE PARTY:  Acct ID - Guarantor Home Phone Work Phone Relationship Acct Type  [id number] [name]  Self P/F     [address], [place], [place] [address]     PLAN OF CARE and INTERVENTIONS:             1. GOALS OF CARE/ ADVANCE CARE PLANNING:  Patient's goal is to remain in her home.  She does not wish to be hospitalzied 2. SOCIAL/EMOTIONAL/SPIRITUAL ASSESSMENT/ INTERVENTIONS:  SW and Palliative Care RN, Daryl Eastern, met with patient and her private caregiver, [name].  Patient stated she fell recently from her couch to the floor, which is carpeted.  She stated she was sore, but not injured.  She said her knees gave out.  Patient reports not being able to remember things as well.  Explored tools to assist with this, including writing things down.  She reported her handwriting is poor.  She continues to watch tv and do jigsaw puzzles. 3. PATIENT/CAREGIVER EDUCATION/ COPING:  Patient and caregiver express their feelings openly. 4. COMMUNITY RESOURCES COORDINATION/ HEALTH CARE NAVIGATION:  None per patient. 5. FINANCIAL/LEGAL CONCERNS/INTERVENTIONS:  None per patient.     SOCIAL HX:  Social History   Tobacco Use  . Smoking status: Never Smoker  . Smokeless tobacco: Never Used  Substance Use Topics  . Alcohol use: No    CODE STATUS:  DNR  ADVANCED DIRECTIVES:  Living Will MOST FORM COMPLETE:  No HOSPICE EDUCATION PROVIDED:  Patient and caregiver are aware of Hospice.  PPS: Patient is able to stand and ambulate with her walker.  Intake remains normal. Duration of visit and documentation:  75 minutes.      Creola Corn Nesta Scaturro, LCSW

## 2018-02-01 NOTE — Progress Notes (Addendum)
COMMUNITY PALLIATIVE CARE RN NOTE  PATIENT NAME: [name] DOB: May 24, 1921 MRN: [id number]  PRIMARY CARE PROVIDER: Wenda Low, MD  RESPONSIBLE PARTY:  Acct ID - Guarantor Home Phone Work Phone Relationship Acct Type  [id number] [name]  Self P/F     [address], [place], [place] [address]    PLAN OF CARE and INTERVENTION:  1. ADVANCE CARE PLANNING/GOALS OF CARE: Remain in her home with hired caregivers and avoid hospitalizations if possible 2. PATIENT/CAREGIVER EDUCATION: Reinforced Safety/Fall Precautions 3. DISEASE STATUS: Joint visit made with [place]. Patient sitting up on her couch awake and alert. Denies pain. Reports taking Tylenol at night at times for back pain which is effective. Patient is concerned with her increased forgetfulness and worsening short term memory along with word finding difficulties. Patient is able to answer simple questions appropriately and make needs known. No breathing issues noted. Patient remains able to self transfer into wheelchair and propel self to the bathroom independently. Patient did admit to having a fall about a week ago while transferring into her wheelchair in her living room. Patient fell backwards. Denies dizziness. No apparent injuries. Was able to get up on her own. Patient told not to utilize her walker unless someone is present for safety purposes. Requires assistance with bathing and dressing. Feeds self independently. Intake remains fair. Eats 3 meals/day along with nutritional supplements. LBM last pm. Reports that she takes "cat" naps during the day. There is about a 2 hour window where patient is left alone until next caregiver arrives. During this time patient lies down on the couch and sleeps anywhere from 1-3 hours. Sleeps well during the night. Enjoys working puzzles.   HISTORY OF PRESENT ILLNESS:  This is a 84 yo female who lives in her home with the assistance of hired caregivers. Patient  continues to be followed by Palliative Care Services. Next visit scheduled in 1 month.  CODE STATUS: DNR ADVANCED DIRECTIVES: Y MOST FORM: no PPS: 40%   PHYSICAL EXAM:   VITALS: Today's Vitals   01/31/18 1042  BP: (!) 148/62  Pulse: (!) 59  Resp: 16  SpO2: 95%  PainSc: 0-No pain    LUNGS: clear to auscultation  CARDIAC: Cor Brady EXTREMITIES: Trace edema in left ankle SKIN: Thin, frail skin. Exposed skin dry and intact, Has U shaped crusted area noted to right calf  that was thought to be a ring worm in the past. Caregiver has been treating with Ketaconazole 2% cream and area is unchanged. Denies pain or itching  NEURO: Alert and oriented x 3, forgetful with worsening short term memory loss, pleasant and engaging, generalized weakness   (Duration of visit and documentation 60 minutes)    Daryl Eastern, RN, BSN

## 2018-02-22 DIAGNOSIS — N3000 Acute cystitis without hematuria: Secondary | ICD-10-CM | POA: Diagnosis not present

## 2018-02-22 DIAGNOSIS — R350 Frequency of micturition: Secondary | ICD-10-CM | POA: Diagnosis not present

## 2018-02-26 ENCOUNTER — Other Ambulatory Visit: Payer: Medicare Other | Admitting: Licensed Clinical Social Worker

## 2018-02-26 ENCOUNTER — Other Ambulatory Visit: Payer: Medicare Other | Admitting: *Deleted

## 2018-02-26 DIAGNOSIS — Z515 Encounter for palliative care: Secondary | ICD-10-CM

## 2018-02-26 NOTE — Progress Notes (Signed)
COMMUNITY PALLIATIVE CARE SW NOTE  PATIENT NAME: [name] DOB: 1920-09-01 MRN: [id number]  PRIMARY CARE PROVIDER: Wenda Low, MD  RESPONSIBLE PARTY:  Acct ID - Guarantor Home Phone Work Phone Relationship Acct Type  [id number] [name]  Self P/F     [address], [place], [place] [address]     PLAN OF CARE and INTERVENTIONS:             1. GOALS OF CARE/ ADVANCE CARE PLANNING:  Patient wants to remain in her home and not be hospitalized. 2. SOCIAL/EMOTIONAL/SPIRITUAL ASSESSMENT/ INTERVENTIONS:  SW, Palliative Care RN, Daryl Eastern, and Palliative Care NP, Stephanie Martinique, met with patient and her private caregiver, [name].  Patient was on her couch finishing breakfast.  She was still in her pajamas and caregiver reported patient was slow getting ready for the day.  Patient denied pain.  She displayed a bright affect at times and was alert and oriented x3.  Patient continues watching tv and doing jigsaw puzzles in her spare time.  She also discussed her granddaughter's wedding in August, which she plans to attend.  Patient reported that her ability to remember things has decreased. 3. PATIENT/CAREGIVER EDUCATION/ COPING:  Patient expresses her feelings openly.  Continue providing education regarding Palliative Care. 4. PERSONAL EMERGENCY PLAN:  Patient uses her Life Alert pendant.   5. COMMUNITY RESOURCES COORDINATION/ HEALTH CARE NAVIGATION:  Patient utilizes family and friends for her care. 6. FINANCIAL/LEGAL CONCERNS/INTERVENTIONS:  None.     SOCIAL HX:  Social History   Tobacco Use  . Smoking status: Never Smoker  . Smokeless tobacco: Never Used  Substance Use Topics  . Alcohol use: No    CODE STATUS:  DNR  ADVANCED DIRECTIVES:  Living Will.  Patient said her daughter , [name], was her POA, during this visit. MOST FORM COMPLETE:  No HOSPICE EDUCATION PROVIDED:  Patient and caregiver are familiar with Hospice. PPS:  Patient is able to stand and ambulate  with her walker.  Her intake is normal.  Patient reported losing four pounds while at the doctor's office last Friday. Duration of visit and documentation:  75 minutes.      Creola Corn Gittel Mccamish, LCSW

## 2018-02-28 NOTE — Progress Notes (Signed)
COMMUNITY PALLIATIVE RN NOTE  PATIENT NAME: [name] DOB: Mar 23, 1921 MRN: [id number]  PRIMARY CARE PROVIDER: Wenda Low, MD  RESPONSIBLE PARTY:  Acct ID - Guarantor Home Phone Work Phone Relationship Acct Type  [id number] [name]  Self P/F     [address], [place], [place] [address]    PLAN OF CARE and INTERVENTION:  1. ADVANCE CARE PLANNING/GOALS OF CARE: Remain at home with hired caregivers, Avoid hospitalizations 2. PATIENT/CAREGIVER EDUCATION: Reinforced Safety/Fall Precautions 3. DISEASE STATUS: Joint visit made with [place] and Stephanie Martinique NP. Patient sitting on the couch eating breakfast upon arrival. Patient pleasant and engaging. Denies pain. Continues to take Tylenol Q HS which remains effective in minimizing back pain. Patient remains able to self transfer to a wheelchair and propels herself to the bathroom. Able to toilet herself. Requires 1 person assistance with showering and dressing. No falls since last visit. Patient did report that she was recently diagnosed with a UTI and was placed on Cipro. Her symptom was frequency, but denies fever, dysuria or foul odor. Had NP assess red, semi circular area noted to R calf region who states that is looks "suspicious" and recommended patient see a Dermatologist. Caregiver left message for daughter with this recommendation. Patient states that her intake is unchanged, but has lost 4 lbs since her last MD visit. Unsure of actual weight. Continues to eat 3 meals a day with Ensure with nutritional supplementation. Took medications during visit without difficulty with Ensure. Denies dysphagia.   HISTORY OF PRESENT ILLNESS:  This is a 84 yo female who continues to be followed by Palliative Care Services. Currently on antibiotics for UTI. Will continue to visit patient to assess overall condition and assist with symptom management needs. Next visit scheduled in 1 month.  CODE STATUS:  DNR   ADVANCED DIRECTIVES: Y MOST FORM: no PPS: 40%   PHYSICAL EXAM:   VITALS: Today's Vitals   02/26/18 2335  BP: (!) 158/70  Pulse: (!) 59  Resp: 16  SpO2: 96%  PainSc: 0-No pain    LUNGS: clear to auscultation  CARDIAC: Cor Brady EXTREMITIES: No edema noted SKIN: Thin/fragile skin, Exposed skin dry and intact, red, semi-circular crusted area noted to R calf region  NEURO: Alert and oriented x 3, forgetful, pleasant mood, ambulatory with walker and 1 person assistance   (Duration of visit and documentation 75 minutes)    Daryl Eastern, RN, BSN

## 2018-03-16 ENCOUNTER — Emergency Department (HOSPITAL_COMMUNITY)
Admission: EM | Admit: 2018-03-16 | Discharge: 2018-03-16 | Disposition: A | Discharge status: Home / Self Care | Payer: Medicare Other | Attending: Emergency Medicine | Admitting: Emergency Medicine

## 2018-03-16 ENCOUNTER — Encounter (HOSPITAL_COMMUNITY): Payer: Self-pay | Admitting: Emergency Medicine

## 2018-03-16 ENCOUNTER — Emergency Department (HOSPITAL_COMMUNITY): Payer: Medicare Other

## 2018-03-16 ENCOUNTER — Other Ambulatory Visit: Payer: Self-pay

## 2018-03-16 DIAGNOSIS — Z79899 Other long term (current) drug therapy: Secondary | ICD-10-CM | POA: Insufficient documentation

## 2018-03-16 DIAGNOSIS — Y929 Unspecified place or not applicable: Secondary | ICD-10-CM | POA: Diagnosis not present

## 2018-03-16 DIAGNOSIS — S91212A Laceration without foreign body of left great toe with damage to nail, initial encounter: Secondary | ICD-10-CM

## 2018-03-16 DIAGNOSIS — Y999 Unspecified external cause status: Secondary | ICD-10-CM | POA: Insufficient documentation

## 2018-03-16 DIAGNOSIS — W230XXA Caught, crushed, jammed, or pinched between moving objects, initial encounter: Secondary | ICD-10-CM | POA: Insufficient documentation

## 2018-03-16 DIAGNOSIS — S62522A Displaced fracture of distal phalanx of left thumb, initial encounter for closed fracture: Secondary | ICD-10-CM | POA: Diagnosis not present

## 2018-03-16 DIAGNOSIS — I10 Essential (primary) hypertension: Secondary | ICD-10-CM | POA: Insufficient documentation

## 2018-03-16 DIAGNOSIS — Z85828 Personal history of other malignant neoplasm of skin: Secondary | ICD-10-CM | POA: Diagnosis not present

## 2018-03-16 DIAGNOSIS — E039 Hypothyroidism, unspecified: Secondary | ICD-10-CM | POA: Insufficient documentation

## 2018-03-16 DIAGNOSIS — Y939 Activity, unspecified: Secondary | ICD-10-CM | POA: Diagnosis not present

## 2018-03-16 DIAGNOSIS — S92425B Nondisplaced fracture of distal phalanx of left great toe, initial encounter for open fracture: Secondary | ICD-10-CM | POA: Diagnosis not present

## 2018-03-16 MED ORDER — CEPHALEXIN 250 MG PO CAPS
500.0000 mg | ORAL_CAPSULE | Freq: Once | ORAL | Status: AC
  Administered 2018-03-16: 500 mg via ORAL
  Filled 2018-03-16: qty 2

## 2018-03-16 MED ORDER — CEPHALEXIN 500 MG PO CAPS: 500.0000 mg | ORAL_CAPSULE | Freq: Four times a day (QID) | ORAL | 0 refills | Status: AC

## 2018-03-16 MED ORDER — LIDOCAINE HCL 2 % IJ SOLN
10.0000 mL | Freq: Once | INTRAMUSCULAR | Status: AC
  Administered 2018-03-16: 200 mg via INTRADERMAL
  Filled 2018-03-16: qty 20

## 2018-03-16 MED ORDER — ACETAMINOPHEN 500 MG PO TABS
500.0000 mg | ORAL_TABLET | Freq: Once | ORAL | Status: AC
  Administered 2018-03-16: 500 mg via ORAL
  Filled 2018-03-16: qty 1

## 2018-03-16 NOTE — ED Provider Notes (Signed)
MSE was initiated and I personally evaluated the patient and placed orders (if any) at  4:59 PM on March 16, 2018.  The patient appears stable so that the remainder of the MSE may be completed by another provider.  Left great toe has hematoma, partially split toenail and bleeding from under the nail.  This happened after patient had a glass door fall on her foot shortly prior to arrival.  She takes clopidogrel every other day.  She has not had any Tylenol today and reports that she is able to take it for pain.  Ordered p.o. Tylenol   Ollen Gross 03/16/18 1700    Lajean Saver, MD 03/17/18 [id number]

## 2018-03-16 NOTE — Discharge Instructions (Addendum)
1. Medications: [phone] mg of Tylenol every 6 hours as needed for pain. Do not exceed 4000 mg of Tylenol daily.  Please take all of your antibiotics until finished!   You may develop abdominal discomfort or diarrhea from the antibiotic.  You may help offset this with probiotics which you can buy or get in yogurt. Do not eat  or take the probiotics until 2 hours after your antibiotic.  2. Treatment: ice for swelling, keep wound clean with warm soap and water and keep bandage dry, do not submerge in water for 24 hours 3. Follow Up: Please return in 7-9 days to have your stitches/staples removed or sooner if you have concerns.  You may also go to urgent care or your primary care physician.  Follow-up with Dr. Erlinda Hong your orthopedist for any ongoing problems with the toe.  Return to the emergency department sooner if any concerning signs or symptoms develop such as high fevers, redness, drainage of pus from the wound, or swelling.   WOUND CARE  Keep area clean and dry for 24 hours. Do not remove bandage, if applied.  After 24 hours, remove bandage and wash wound gently with mild soap and warm water. Reapply a new bandage after cleaning wound, if directed.   Continue daily cleansing with soap and water until stitches/staples are removed.  Do not apply any ointments or creams to the wound while stitches/staples are in place, as this may cause delayed healing. Return if you experience any of the following signs of infection: Swelling, redness, pus drainage, streaking, fever >101.0 F  Return if you experience excessive bleeding that does not stop after 15-20 minutes of constant, firm pressure.

## 2018-03-16 NOTE — ED Provider Notes (Signed)
[facility] EMERGENCY DEPARTMENT Provider Note   CSN: [id number] Arrival date & time: 03/16/18  1534     History   Chief Complaint Chief Complaint  Patient presents with  . Toe Pain    HPI [name] is a 84 y.o. female with history of hypertension, hypothyroidism, osteoporosis, anemia, PSVT presents for evaluation of acute onset, persistent injury to the left great toe.  She states that a glass sliding door fell out of its frame and landed on her left great toe.  She sustained a laceration and toenail injury.  She notes constant throbbing pain which does not radiate.  Pain worsens with movement and palpation.  She does take Plavix every other day but cannot remember if she took her Plavix this morning.  She does think she is up-to-date on her tetanus.  Denies numbness tingling or weakness.  No medications prior to arrival.  The history is provided by the patient.    Past Medical History:  Diagnosis Date  . Anemia   . Anginal pain ([place])   . Arthritis    "all over; doesn't seem to bother me much" (08/22/2013)  . Chronic lower back pain   . Dysrhythmia    "irregular heart beat"   . Exertional shortness of breath   . GERD (gastroesophageal reflux disease)   . Hypertension   . Hypothyroidism   . Migraines    "used to"  . Mixed hyperlipidemia   . Osteoporosis   . Paroxysmal supraventricular tachycardia ([place])   . Skin cancer    "couple taken off right leg" (08/22/2013)    Patient Active Problem List   Diagnosis Date Noted  . Chronic bilateral low back pain 10/31/2016  . Closed subcapital fracture of femur, left, initial encounter ([place]) 10/16/2016  . Peripheral vascular disease ([place]) 10/16/2016  . Essential hypertension 10/16/2016  . Hypothyroidism, acquired 10/16/2016  . Dry eye 04/21/2015  . H/O surgical procedure 05/25/2014  . 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 Pseudoaphakia 04/10/2014  . Acute appendicitis 08/22/2013    Past Surgical  History:  Procedure Laterality Date  . ABDOMINAL HYSTERECTOMY    . ANTERIOR APPROACH HEMI HIP ARTHROPLASTY Left 10/18/2016   Procedure: ANTERIOR APPROACH LEFT HEMI HIP ARTHROPLASTY;  Surgeon: Leandrew Koyanagi, MD;  Location: [facility];  Service: Orthopedics;  Laterality: Left;  . APPENDECTOMY  08/22/2013  . BACK SURGERY    . CATARACT EXTRACTION W/ INTRAOCULAR LENS  IMPLANT, BILATERAL    . LAPAROSCOPIC APPENDECTOMY N/A 08/22/2013   Procedure: APPENDECTOMY LAPAROSCOPIC;  Surgeon: Imogene Burn. [name], MD;  Location: [facility];  Service: General;  Laterality: N/A;  . LUMBAR [place] SURGERY  2008  . THROAT SURGERY  1940's; ?   "cysts removed"      OB History   None      Home Medications    Prior to Admission medications   Medication Sig Start Date End Date Taking? Authorizing Provider  acetaminophen (TYLENOL) 500 MG tablet Take 2 tablets (1,000 mg total) by mouth 3 (three) times daily. X 3 DAYS 10/20/16   Rai, Ripudeep K, MD  bimatoprost (LUMIGAN) 0.01 % SOLN Place 1 drop into both eyes at bedtime.    [provider]  BIOTIN PO Take 1 tablet by mouth daily.    [provider]  bisacodyl (DULCOLAX) 10 MG suppository Place 1 suppository (10 mg total) rectally daily as needed for moderate constipation. 10/20/16   Rai, Ripudeep K, MD  brimonidine (ALPHAGAN P) 0.1 % SOLN Place 1  drop into the left eye 3 (three) times daily. 05/07/14   [provider]  Calcium Citrate-Vitamin D (CALCITRATE) 315-250 MG-UNIT TABS Take 2 tablets by mouth 2 (two) times daily.    [provider]  cephALEXin (KEFLEX) 500 MG capsule Take 1 capsule (500 mg total) by mouth 4 (four) times daily for 5 days. 03/16/18 03/21/18  Rodell Perna A, PA-C  diltiazem (DILACOR XR) 120 MG 24 hr capsule Take 120 mg by mouth every evening.     [provider]  dorzolamide-timolol (COSOPT) 22.3-6.8 MG/ML ophthalmic solution Place 1 drop into the left eye 2 (two) times daily. 05/07/14   [provider]  feeding  supplement, ENSURE ENLIVE, (ENSURE ENLIVE) LIQD Take 237 mLs by mouth 2 (two) times daily between meals. 10/20/16   Rai, Ripudeep K, MD  ferrous sulfate 325 (65 FE) MG EC tablet Take 325 mg by mouth every evening.    [provider]  fexofenadine (ALLEGRA) 180 MG tablet Take 180 mg by mouth daily.    [provider]  gemfibrozil (LOPID) 600 MG tablet Take 600 mg by mouth 2 (two) times daily before a meal.    [provider]  levothyroxine (SYNTHROID, LEVOTHROID) 50 MCG tablet Take 25-50 mcg by mouth See admin instructions. Takes 1/2 tab on even days Takes 1 tab on odd days    [provider]  lisinopril (PRINIVIL,ZESTRIL) 20 MG tablet Take 20 mg by mouth daily.    [provider]  metoprolol (LOPRESSOR) 50 MG tablet Take 50 mg by mouth 2 (two) times daily.    [provider]  Omega-3 Fatty Acids (FISH OIL) 1000 MG CAPS Take 1 capsule by mouth 2 (two) times daily.    [provider]  omeprazole (PRILOSEC) 20 MG capsule Take 20 mg by mouth daily.    [provider]  polyethylene glycol (MIRALAX / GLYCOLAX) packet Take 17 g by mouth daily. 10/21/16   Rai, Vernelle Emerald, MD  Rivaroxaban (XARELTO) 15 MG TABS tablet Take 15 mg by mouth 2 (two) times daily with a meal.    [provider]  senna-docusate (SENOKOT-S) 8.6-50 MG tablet Take 1 tablet by mouth 2 (two) times daily. 10/20/16   Rai, Vernelle Emerald, MD  sertraline (ZOLOFT) 100 MG tablet Take 100 mg by mouth at bedtime.     [provider]  VITAMIN D, CHOLECALCIFEROL, PO Take 1 tablet by mouth every evening.    [provider]    Family History No family history on file.  Social History Social History   Tobacco Use  . Smoking status: Never Smoker  . Smokeless tobacco: Never Used  Substance Use Topics  . Alcohol use: No  . Drug use: No     Allergies   Codeine and Other   Review of Systems Review of Systems  Constitutional: Negative for fever.    Skin: Negative for wound.  Neurological: Negative for weakness and numbness.  Hematological: Bruises/bleeds easily.     Physical Exam Updated Vital Signs BP (!) 192/58 (BP Location: Right Arm)   Pulse 94   Temp 97.9 F (36.6 C) (Axillary) Comment (Src): pt was drinking cold qwater in lobby  Resp 18   Ht 4\' 11"  (1.499 m)   Wt 48.1 kg (106 lb)   SpO2 99%   BMI 21.41 kg/m   Physical Exam  Constitutional: She appears well-developed and well-nourished. No distress.  HENT:  Head: Normocephalic and atraumatic.  Eyes: Conjunctivae are normal. Right eye exhibits  no discharge. Left eye exhibits no discharge.  Neck: No JVD present. No tracheal deviation present.  Cardiovascular: Normal rate and intact distal pulses.  2+ DP/PT pulses bilaterally, no lower extremity edema.  Pulmonary/Chest: Effort normal.  Abdominal: She exhibits no distension.  Musculoskeletal: She exhibits no edema.  See attached images.  Patient has a laceration to the plantar aspect of the left great toe extending into the toenail.  Slight oozing although overall bleeding is controlled.  She has ecchymosis to the left great toe proximally.  EHL strength is intact.  5/5 strength with plantarflexion and dorsiflexion.  No underlying crepitus noted.  Neurological: She is alert.  Fluent speech, no facial droop, sensation intact to soft touch of bilateral lower extremities.  Skin: Skin is warm and dry. No erythema.  Psychiatric: She has a normal mood and affect. Her behavior is normal.  Nursing note and vitals reviewed.        ED Treatments / Results  Labs (all labs ordered are listed, but only abnormal results are displayed) Labs Reviewed - No data to display  EKG None  Radiology Dg Foot Complete Left  Result Date: 03/16/2018 CLINICAL DATA:  Left great toe hematoma.  Trauma. EXAM: LEFT FOOT - COMPLETE 3+ VIEW COMPARISON:  None. FINDINGS: There is a comminuted fracture through the distal first phalanx. No  other fractures noted. Hallux valgus deformity. Osteopenia. No other acute abnormalities. IMPRESSION: Comminuted fracture of the distal first phalanx. Electronically Signed   By: Dorise Bullion III M.D   On: 03/16/2018 17:35    Procedures .Marland KitchenLaceration Repair Date/Time: 03/16/2018 6:55 PM Performed by: Renita Papa, PA-C Authorized by: Renita Papa, PA-C   Consent:    Consent obtained:  Verbal   Consent given by:  Patient   Risks discussed:  Infection, pain, poor cosmetic result and poor wound healing Anesthesia (see MAR for exact dosages):    Anesthesia method:  Local infiltration   Local anesthetic:  Lidocaine 2% w/o epi Laceration details:    Location:  Toe   Toe location:  L big toe   Length (cm):  1   Depth (mm):  3 Repair type:    Repair type:  Simple Pre-procedure details:    Preparation:  Patient was prepped and draped in usual sterile fashion and imaging obtained to evaluate for foreign bodies Exploration:    Wound exploration: wound explored through full range of motion and entire depth of wound probed and visualized     Wound extent: underlying fracture     Wound extent: no nerve damage noted and no tendon damage noted     Contaminated: no   Treatment:    Area cleansed with:  Betadine and saline   Amount of cleaning:  Extensive   Irrigation solution:  Sterile saline   Irrigation method:  Pressure wash   Visualized foreign bodies/material removed: no   Skin repair:    Repair method:  Sutures   Suture size:  4-0   Suture material:  Prolene   Suture technique:  Simple interrupted   Number of sutures:  2 Approximation:    Approximation:  Close Post-procedure details:    Dressing:  Sterile dressing and splint for protection   Patient tolerance of procedure:  Tolerated well, no immediate complications   (including critical care time)  Medications Ordered in ED Medications  acetaminophen (TYLENOL) tablet 500 mg (500 mg Oral Given 03/16/18 1716)  lidocaine  (XYLOCAINE) 2 % (with pres) injection 200 mg (200 mg Intradermal Given  03/16/18 1830)  cephALEXin (KEFLEX) capsule 500 mg (500 mg Oral Given 03/16/18 1918)     Initial Impression / Assessment and Plan / ED Course  I have reviewed the triage vital signs and the nursing notes.  Pertinent labs & imaging results that were available during my care of the patient were reviewed by me and considered in my medical decision making (see chart for details).     Patient presents for evaluation of left great toe injury.  She is afebrile, hypertensive while in the ED though could be in response to pain.  Advised family to monitor patient's blood pressure and follow-up with PCP if it remains elevated for medication management.  She is otherwise asymptomatic.  She is neurovascularly intact.  Radiographs show a comminuted fracture of the distal first phalanx of the left foot.  Patient was seen and evaluated in conjunction with Dr. Ashok Cordia who advises against toenail removal given that the nail is firmly adhered to the nailbed. Pressure irrigation performed. Wound explored and base of wound visualized in a bloodless field without evidence of foreign body.  Laceration occurred < 8 hours prior to repair which was well tolerated.  Her tetanus is up-to-date. Pt discharged with antibiotics given open fracture, first dose given in the ED prior to discharge.  Discussed suture home care with patient and answered questions. Pt to follow-up for wound check and suture removal in 7 days; they are to return to the ED sooner for signs of infection.  Also given follow-up with Dr. Erlinda Hong her orthopedist for reevaluation of underlying fracture though I anticipate it will not require long-term management.  Patient and patient's family verbalized understanding of and agreement with plan and patient is stable for discharge home at this time.  Final Clinical Impressions(s) / ED Diagnoses   Final diagnoses:  Laceration of left great toe without  foreign body with damage to nail, initial encounter  Open nondisplaced fracture of distal phalanx of left great toe, initial encounter    ED Discharge Orders        Ordered    cephALEXin (KEFLEX) 500 MG capsule  4 times daily     03/16/18 1904       Debroah Baller 03/17/18 0048    Lajean Saver, MD 03/17/18 1843

## 2018-03-20 ENCOUNTER — Other Ambulatory Visit: Payer: Medicare Other | Admitting: Licensed Clinical Social Worker

## 2018-03-20 ENCOUNTER — Other Ambulatory Visit: Payer: Medicare Other | Admitting: *Deleted

## 2018-03-20 DIAGNOSIS — Z515 Encounter for palliative care: Secondary | ICD-10-CM

## 2018-03-20 NOTE — Progress Notes (Signed)
COMMUNITY PALLIATIVE CARE SW NOTE  PATIENT NAME: [name] DOB: 07-05-1921 MRN: [id number]  PRIMARY CARE PROVIDER: Wenda Low, MD  RESPONSIBLE PARTY:  Acct ID - Guarantor Home Phone Work Phone Relationship Acct Type  [id number] [name]  Self P/F     [place], [place], [place] [address]     PLAN OF CARE and INTERVENTIONS:             1. GOALS OF CARE/ ADVANCE CARE PLANNING:  Patient wants to remain in her home and not be hospitalized.  She has a DNR. 2. SOCIAL/EMOTIONAL/SPIRITUAL ASSESSMENT/ INTERVENTIONS:   SW and Palliative Care RN, Daryl Eastern, met with patient and her caregiver, [name].  Patient was at her granddaughter's home last Sunday when a sliding glass door fell on her left big toe.  She went to the ED and had it stiched.  It was also broken.  Upon SW arrival, patient was on her couch with her left leg elevated.  She was alert and oriented.  She denied pain.  She reported if she has to stand, it will become sore.  RN provided additional education regarding wound care. 3. PATIENT/CAREGIVER EDUCATION/ COPING:  Patient copes by expressing her thoughts and feelings. 4. PERSONAL EMERGENCY PLAN:  Patient has a life alert pendant.  5. COMMUNITY RESOURCES COORDINATION/ HEALTH CARE NAVIGATION:  Patient has private caregivers during the day. 6. FINANCIAL/LEGAL CONCERNS/INTERVENTIONS:  None per patient.     SOCIAL HX:  Social History   Tobacco Use  . Smoking status: Never Smoker  . Smokeless tobacco: Never Used  Substance Use Topics  . Alcohol use: No    CODE STATUS:  DNR  ADVANCED DIRECTIVES:  Y MOST FORM COMPLETE:  N HOSPICE EDUCATION PROVIDED:  Patient is familiar with Hospice. PPS:  Patient's appetite is normal.  She walks on her heal with her walker.      Creola Corn Alexina Niccoli, LCSW

## 2018-03-22 ENCOUNTER — Other Ambulatory Visit: Payer: Medicare Other | Admitting: *Deleted

## 2018-03-22 DIAGNOSIS — Z515 Encounter for palliative care: Secondary | ICD-10-CM

## 2018-03-22 NOTE — Progress Notes (Signed)
COMMUNITY PALLIATIVE CARE RN NOTE  PATIENT NAME: [name] DOB: [birth year] MRN: [id number]  PRIMARY CARE PROVIDER: Wenda Low, MD  RESPONSIBLE PARTY:  Acct ID - Guarantor Home Phone Work Phone Relationship Acct Type  [id number] [name]  Self P/F     [place], [place], [place] [address]    PLAN OF CARE and INTERVENTION:  1. ADVANCE CARE PLANNING/GOALS OF CARE: Remain in her home with hired caregivers, avoid hospitalizations 2. PATIENT/CAREGIVER EDUCATION: Reinforced Safety/Fall Precautions 3. DISEASE STATUS: Joint visit made with [facility]. Spoke with daughter Juliann Pulse yesterday who asked if this RN could visit with patient and assess her L great toe. On 03/16/18, patient was at her grandson's home for a party and the sliding glass door came off the hinges and the top of it landed on patient's toe. Patient was taken to the ED where she was diagnosed with a L great toe fracture and stitches were required. Patient placed on Keflex 500mg  po QID x 5 days. Daughter reports that a blister has formed on the top of her L great toe and was concerned as it looked as if it was going to burst. Visit made today and caregiver reports her toe swelling and blister has improved today. Toe is less red and blister is smaller. Bruising noted to L great toe, as well as the 2nd and 3rd toes. L foot being elevated on stool. Patient has a follow up appointment with her PCP on 03/26/18. Reviewed care instructions and provided additional instructions on how to care for her toe if the blister does in fact burst. Will follow up on Friday 03/22/18 and assess L toe again to make sure that it is healing properly.    HISTORY OF PRESENT ILLNESS:  This is a 84 yo female who resides in her home with the assistance of her caregivers. Palliative Care team to continue to follow. Next visit scheduled on 03/22/18.  CODE STATUS: DNR   ADVANCED DIRECTIVES: Y MOST FORM: no PPS: 30%   PHYSICAL EXAM:    VITALS: Today's Vitals   03/20/18 0943  BP: (!) 160/64  Pulse: 61  Resp: 15  Temp: (!) 97.5 F (36.4 C)  SpO2: 97%  PainSc: 0-No pain    LUNGS: clear to auscultation  CARDIAC: Cor RRR EXTREMITIES: Swelling noted to L great toe, no edema noted SKIN: Bruising noted to L great, 2nd and 3rd toes along with side of foot, Stitches intact, other exposed skin dry and intact  NEURO: Alert and oriented x 3, forgetful, generalized weakness, ambulates with walker and 1 person assistance when caregiver is present, propels self in wheelchair otherwise   (Duration of visit and documentation 75 minutes)    Daryl Eastern, RN, BSN

## 2018-03-25 NOTE — Progress Notes (Signed)
COMMUNITY PALLIATIVE CARE RN NOTE  PATIENT NAME: [name] DOB: 1921-07-07 MRN: [id number]  PRIMARY CARE PROVIDER: Wenda Low, MD  RESPONSIBLE PARTY:  Acct ID - Guarantor Home Phone Work Phone Relationship Acct Type  [id number] [name]  Self P/F     [place], [place], [place] [address]    PLAN OF CARE and INTERVENTION:  1. ADVANCE CARE PLANNING/GOALS OF CARE: Remain in her home with hired caregivers, avoid hospitalizations 2. PATIENT/CAREGIVER EDUCATION: Reinforced Safety/Fall Precautions, Pain Management 3. DISEASE STATUS: Follow up visit made to assess L great toe s/p fracture and sutures. Area continues to improve. Swelling continues to decrease and area is less red. Continues with some bruising to L great, 2nd and 3rd toes. Blister remains intact. Denies pain. Just completed 5 day course of antibiotics today. Caregiver cleansing area with warm soap and water daily to keep area clean. Patient has a follow up appointment with PCP on 03/26/18.   HISTORY OF PRESENT ILLNESS:  This is a 85 yo female who resides in her home with the assistance of hired caregivers twice daily. Palliative Care continues to follow.   CODE STATUS: DNR  ADVANCED DIRECTIVES: Y MOST FORM: no PPS: 30%   PHYSICAL EXAM:   VITALS: Today's Vitals   03/22/18 1039  BP: (!) 158/70  Pulse: (!) 54  Resp: 16  Temp: 98.1 F (36.7 C)  TempSrc: Temporal  SpO2: 96%  PainSc: 0-No pain    LUNGS: clear to auscultation  CARDIAC: Cor Loletha Grayer EXTREMITIES: No edema, slight swelling of L great toe d/t fracture SKIN: Bruising noted to L great, 2nd and 3rd toes as well as side of L foot, sutures intact  NEURO: Alert and oriented x 3, pleasant mood, generalized weakness, ambulatory with walker w/1 person assist    (Duration of visit and documentation 60 minutes)   Daryl Eastern, RN, BSN

## 2018-03-26 DIAGNOSIS — S92912A Unspecified fracture of left toe(s), initial encounter for closed fracture: Secondary | ICD-10-CM | POA: Diagnosis not present

## 2018-03-26 DIAGNOSIS — Z4802 Encounter for removal of sutures: Secondary | ICD-10-CM | POA: Diagnosis not present

## 2018-04-09 ENCOUNTER — Other Ambulatory Visit: Payer: Medicare Other | Admitting: Licensed Clinical Social Worker

## 2018-04-09 ENCOUNTER — Other Ambulatory Visit: Payer: Medicare Other | Admitting: *Deleted

## 2018-04-09 DIAGNOSIS — Z515 Encounter for palliative care: Secondary | ICD-10-CM

## 2018-04-10 DIAGNOSIS — F324 Major depressive disorder, single episode, in partial remission: Secondary | ICD-10-CM | POA: Diagnosis not present

## 2018-04-10 DIAGNOSIS — I471 Supraventricular tachycardia: Secondary | ICD-10-CM | POA: Diagnosis not present

## 2018-04-10 DIAGNOSIS — N183 Chronic kidney disease, stage 3 (moderate): Secondary | ICD-10-CM | POA: Diagnosis not present

## 2018-04-10 DIAGNOSIS — E782 Mixed hyperlipidemia: Secondary | ICD-10-CM | POA: Diagnosis not present

## 2018-04-10 DIAGNOSIS — E039 Hypothyroidism, unspecified: Secondary | ICD-10-CM | POA: Diagnosis not present

## 2018-04-10 DIAGNOSIS — R269 Unspecified abnormalities of gait and mobility: Secondary | ICD-10-CM | POA: Diagnosis not present

## 2018-04-10 DIAGNOSIS — I10 Essential (primary) hypertension: Secondary | ICD-10-CM | POA: Diagnosis not present

## 2018-04-10 DIAGNOSIS — E46 Unspecified protein-calorie malnutrition: Secondary | ICD-10-CM | POA: Diagnosis not present

## 2018-04-10 DIAGNOSIS — K219 Gastro-esophageal reflux disease without esophagitis: Secondary | ICD-10-CM | POA: Diagnosis not present

## 2018-04-10 DIAGNOSIS — G459 Transient cerebral ischemic attack, unspecified: Secondary | ICD-10-CM | POA: Diagnosis not present

## 2018-04-10 DIAGNOSIS — G450 Vertebro-basilar artery syndrome: Secondary | ICD-10-CM | POA: Diagnosis not present

## 2018-04-10 NOTE — Progress Notes (Signed)
COMMUNITY PALLIATIVE CARE SW NOTE  PATIENT NAME: [name] DOB: April 25, 1921 MRN: [id number]  PRIMARY CARE PROVIDER: Wenda Low, MD  RESPONSIBLE PARTY:  Acct ID - Guarantor Home Phone Work Phone Relationship Acct Type  [id number] [name]  Self P/F     [place], [place] [address]     PLAN OF CARE and INTERVENTIONS:             1. GOALS OF CARE/ ADVANCE CARE PLANNING:  Patient wishes to remain in her home and not be hospitalized.  She has a DNR. 2. SOCIAL/EMOTIONAL/SPIRITUAL ASSESSMENT/ INTERVENTIONS:  SW and Palliative Care RN, Daryl Eastern, met with patient and her caregiver, [name].  Patient denied pain.  This is the first day she was able to wear shoes since her toe accident.  SW provided active listening and supportive counseling while patient talked about attending her granddaughter's wedding last weekend.  She was alert and oriented x3.  She did expressed not having being able to recall things as easily as she used to. 3. PATIENT/CAREGIVER EDUCATION/ COPING:  Continue providing education regarding the Palliative Care Program. 4. PERSONAL EMERGENCY PLAN:  Patient has a life alert pendant. 5. COMMUNITY RESOURCES COORDINATION/ HEALTH CARE NAVIGATION:  Patient has private caregivers during the day.  Her daughter and husband live across the street. 6. FINANCIAL/LEGAL CONCERNS/INTERVENTIONS:  None per patient.     SOCIAL HX:  Social History   Tobacco Use  . Smoking status: Never Smoker  . Smokeless tobacco: Never Used  Substance Use Topics  . Alcohol use: No    CODE STATUS:  DNR ADVANCED DIRECTIVES: Y MOST FORM COMPLETE:  N HOSPICE EDUCATION PROVIDED:  Patient is familiar with Hospice. PPS:  Patient's appetite is normal.  She is able to transfer from her couch to her w/c. Duration of visit and documentation:  70 minutes.      Creola Corn Niccolo Burggraf, LCSW

## 2018-04-15 NOTE — Progress Notes (Signed)
COMMUNITY PALLIATIVE CARE RN NOTE  PATIENT NAME: [name] DOB: [birth year] MRN: [id number]  PRIMARY CARE PROVIDER: Wenda Low, MD  RESPONSIBLE PARTY:  Acct ID - Guarantor Home Phone Work Phone Relationship Acct Type  [id number] [name]  Self P/F     [place], [place], [place] [address]    PLAN OF CARE and INTERVENTION:  1. ADVANCE CARE PLANNING/GOALS OF CARE: Remain in her home with hired sitters, avoid hospitalizations 2. PATIENT/CAREGIVER EDUCATION: Reinforced Safety/Fall Precautions 3. DISEASE STATUS: Joint visit made with [facility]. Patient sitting on the couch upon arrival. Caregiver present during visit. She states that earlier this am she was experiencing some dizziness. She took PRN Meclizine which was effective. Denies pain. L great toe from recent fracture looks much better than noted in visit 2 weeks ago. There is a small area noted to top of L great toe where a blister burst. Area is slightly red, but flat in appearance. No s/s of any infection noted. Swelling has decreased significantly. Bruising is minimal. She has an appointment with her PCP tomorrow for follow up. Occasional non productive cough. She says it feels as if something is in her throat. Some throat congestion noted. She reports wishing that she was able to walk without feeling as if she is going to fall backwards. She continues to propel herself via w/c inside her home when she is alone, but is ambulatory with use of walker when someone is present. She feels that she is becoming more forgetful and has some word finding difficulties. Continues to take her medications whole with Boost without difficulty. Intake is fair. Will continue to monitor.  HISTORY OF PRESENT ILLNESS: This is a 84 yo female who continues to be followed by Palliative Care Services to assess overall condition and assist with symptom management needs. Next visit scheduled in 1 month.   CODE STATUS:  DNR ADVANCED DIRECTIVES: Y MOST FORM: no PPS: 30%   PHYSICAL EXAM:   VITALS: Today's Vitals   04/09/18 1037  BP: (!) 160/70  Pulse: (!) 54  Resp: 16  SpO2: 97%  PainSc: 0-No pain    LUNGS: clear to auscultation  CARDIAC: Cor Brady  EXTREMITIES: No edema SKIN: Small red area noted to top of L great toe where blister burst, minimal bruising to L great toe and across 2nd and 3rd left toes  NEURO: Alert and oriented x 4, forgetful, word finding difficulties, ambulatory with walker w/ 1 person assist, when alone only propels in wheelchair   (Duration of visit and documentation 75 minutes)    Daryl Eastern, RN, BSN

## 2018-05-07 ENCOUNTER — Other Ambulatory Visit: Payer: Medicare Other | Admitting: *Deleted

## 2018-05-07 ENCOUNTER — Other Ambulatory Visit: Payer: Medicare Other | Admitting: Licensed Clinical Social Worker

## 2018-05-07 DIAGNOSIS — Z515 Encounter for palliative care: Secondary | ICD-10-CM

## 2018-05-07 NOTE — Progress Notes (Signed)
COMMUNITY PALLIATIVE CARE SW NOTE  PATIENT NAME: [name] DOB: 1921-07-01 MRN: [id number]  PRIMARY CARE PROVIDER: Wenda Low, MD  RESPONSIBLE PARTY:  Acct ID - Guarantor Home Phone Work Phone Relationship Acct Type  [id number] [name]  Self P/F     [place], [place] [address]     PLAN OF CARE and INTERVENTIONS:             1. GOALS OF CARE/ ADVANCE CARE PLANNING:  Patient does not wish to be hospitalized.  She wants to remain in her home.  Patient has a DNR. 2. SOCIAL/EMOTIONAL/SPIRITUAL ASSESSMENT/ INTERVENTIONS:  SW and Palliative Care RN, Daryl Eastern, met with patient and her private caregiver, [name], in patient's home.  Patient denied pain and dizzy spells since last visit.  Patient dresses herself and requires assistance while taking a shower.  Patient reported reflux last night which was relieved with medication.  Patient reports being worried about some new growths on the torso and back.  She is scheduled to see a dermatologist.  SW provided active listening and supportive counseling. 3. PATIENT/CAREGIVER EDUCATION/ COPING:  Patient expresses her feelings openly. 4. PERSONAL EMERGENCY PLAN:  Patient has a life alert pendant.  She also contacts her family if there is a medical need. 5. COMMUNITY RESOURCES COORDINATION/ HEALTH CARE NAVIGATION:  Patient has private caregivers during the day. 6. FINANCIAL/LEGAL CONCERNS/INTERVENTIONS:  None per patient.     SOCIAL HX:  Social History   Tobacco Use  . Smoking status: Never Smoker  . Smokeless tobacco: Never Used  Substance Use Topics  . Alcohol use: No    CODE STATUS:  DNR ADVANCED DIRECTIVES: Y MOST FORM COMPLETE:  N HOSPICE EDUCATION PROVIDED:  Patient is aware of Hospice. PPS:  Patient's appetite is normal.  She uses a w/c for support when she walks. Duration of visit and documentation:  75 minutes.      Creola Corn Keyion Knack, LCSW

## 2018-05-09 DIAGNOSIS — C44519 Basal cell carcinoma of skin of other part of trunk: Secondary | ICD-10-CM | POA: Diagnosis not present

## 2018-05-09 DIAGNOSIS — L820 Inflamed seborrheic keratosis: Secondary | ICD-10-CM | POA: Diagnosis not present

## 2018-05-09 DIAGNOSIS — D225 Melanocytic nevi of trunk: Secondary | ICD-10-CM | POA: Diagnosis not present

## 2018-05-09 DIAGNOSIS — L821 Other seborrheic keratosis: Secondary | ICD-10-CM | POA: Diagnosis not present

## 2018-05-09 DIAGNOSIS — D485 Neoplasm of uncertain behavior of skin: Secondary | ICD-10-CM | POA: Diagnosis not present

## 2018-05-09 DIAGNOSIS — D0471 Carcinoma in situ of skin of right lower limb, including hip: Secondary | ICD-10-CM | POA: Diagnosis not present

## 2018-05-09 NOTE — Progress Notes (Signed)
COMMUNITY PALLIATIVE CARE RN NOTE  PATIENT NAME: [name] DOB: Mar 06, 1921 MRN: [id number]  PRIMARY CARE PROVIDER: Wenda Low, MD  RESPONSIBLE PARTY:  Acct ID - Guarantor Home Phone Work Phone Relationship Acct Type  [id number] [name]  Self P/F     [place], [place], [place] [address]    PLAN OF CARE and INTERVENTION:  1. ADVANCE CARE PLANNING/GOALS OF CARE: Remain in her home and avoid hospitalizations 2. PATIENT/CAREGIVER EDUCATION: Reinforced Safe Mobility/Transfers 3. DISEASE STATUS: Joint visit made with [place]. Upon arrival, patient sitting on her couch watching TV. Caregiver present during visit. She denies pain during visit, however does experience mild pain in her lower back at times with movement. Continues to take Tylenol at bedtime which is effective. No dyspnea noted. She denies dizziness. Remains able to ambulate using her walker when caregiver is present. When alone, she continues to transfer to her wheelchair if needed, and propel herself to the bathroom for safety. Her blood pressures have been elevated and her HR bradycardic in the 50s, so both are being monitored by caregiver on a daily basis. Intake is fair. Denies dysphagia, but does report that at times she feels as if something is in her throat. Her daughter gave her some medication for acid reflux last pm, and this symptom improved. Her L great toe has now healed where a sliding door fell on it in July, and bruising has resolved. She is now able to wear regular shoes. She has an appointment on 05/09/18 with a Dermatologist to assess a suspicious area noted to her R inner knee. She also reports a dry, red crusted area noted to her L mid back that is always itchy. Recommended this area be assessed as well. Will continue to monitor.  HISTORY OF PRESENT ILLNESS:  This is a 84 yo female who resides in her home. She has caregivers twice daily to assist with ADLs. Palliative Care  Team continues to follow. Next visit scheduled in 1 month.  CODE STATUS: DNR  ADVANCED DIRECTIVES: Y MOST FORM: no PPS: 30%   PHYSICAL EXAM:   VITALS: Today's Vitals   05/07/18 1041  BP: (!) 180/68  Pulse: (!) 55  Resp: 17  SpO2: 96%  PainSc: 0-No pain    LUNGS: clear to auscultation  CARDIAC: Cor Brady EXTREMITIES: No edema SKIN: Exposed skin dry and intact, suspicious red crusted area noted to R inner knee/leg and L upper back  NEURO: Alert and oriented x 3, forgetful, generalized weakness, ambulatory with walker w/assistance   (Duration of visit and documentation 75 minutes)    Daryl Eastern, RN, BSN

## 2018-05-10 DIAGNOSIS — Z23 Encounter for immunization: Secondary | ICD-10-CM | POA: Diagnosis not present

## 2018-05-22 ENCOUNTER — Telehealth: Payer: Self-pay

## 2018-05-22 NOTE — Telephone Encounter (Signed)
Received phone call from caregiver. Caregiver shared that she needs to notify Monishia RN that visit needs to rescheduled due to a conflict. Monishia RN made aware

## 2018-06-04 DIAGNOSIS — L570 Actinic keratosis: Secondary | ICD-10-CM | POA: Diagnosis not present

## 2018-06-04 DIAGNOSIS — C44519 Basal cell carcinoma of skin of other part of trunk: Secondary | ICD-10-CM | POA: Diagnosis not present

## 2018-06-06 ENCOUNTER — Other Ambulatory Visit: Payer: Medicare Other | Admitting: *Deleted

## 2018-06-06 ENCOUNTER — Other Ambulatory Visit: Payer: Medicare Other | Admitting: Licensed Clinical Social Worker

## 2018-06-06 DIAGNOSIS — Z515 Encounter for palliative care: Secondary | ICD-10-CM

## 2018-06-09 NOTE — Progress Notes (Signed)
COMMUNITY PALLIATIVE CARE SW NOTE  PATIENT NAME: [name] DOB: October 20, 1920 MRN: [id number]  PRIMARY CARE PROVIDER: Wenda Low, MD  RESPONSIBLE PARTY:  Acct ID - Guarantor Home Phone Work Phone Relationship Acct Type  [id number] [name]  Self P/F     [place], [place] [address]     PLAN OF CARE and INTERVENTIONS:             1. GOALS OF CARE/ ADVANCE CARE PLANNING:  Patient wishes to remain in her home.  She does not want to be hospitalized.  She has a DNR. 2. SOCIAL/EMOTIONAL/SPIRITUAL ASSESSMENT/ INTERVENTIONS:  SW and Palliative Care RN, Daryl Eastern, met with patient and her caregiver, [name].  Patient denied pain.  She discussed having a pre-cancerous area removed from her back recently.  She is also scheduled for a specialist to assess an area on her leg.  Patient appears to respond positively to life review.  SW provided active listening and supportive counseling. 3. PATIENT/CAREGIVER EDUCATION/ COPING:  Patient copes by expressing her feelings openly.  She spends time doing jigsaw puzzles with her caregiver. 4. PERSONAL EMERGENCY PLAN:  Patient has caregivers during the day and her daughter lives close by and checks on her often.  She also has a life alert. 5. COMMUNITY RESOURCES COORDINATION/ HEALTH CARE NAVIGATION:  Patient has private caregivers during the day. 6. FINANCIAL/LEGAL CONCERNS/INTERVENTIONS:  None per patient.     SOCIAL HX:  Social History   Tobacco Use  . Smoking status: Never Smoker  . Smokeless tobacco: Never Used  Substance Use Topics  . Alcohol use: No    CODE STATUS:  DNR ADVANCED DIRECTIVES:  Y MOST FORM COMPLETE:  N HOSPICE EDUCATION PROVIDED:   Patient is aware of Hospice. PPS:  Patient reported her appetite is normal.  She uses her w/c as a walker.  Duration of visit and duration:  75 minutes.      Creola Corn Blayne Garlick, LCSW

## 2018-06-10 DIAGNOSIS — H401421 Capsular glaucoma with pseudoexfoliation of lens, left eye, mild stage: Secondary | ICD-10-CM | POA: Diagnosis not present

## 2018-06-10 DIAGNOSIS — H01021 Squamous blepharitis right upper eyelid: Secondary | ICD-10-CM | POA: Diagnosis not present

## 2018-06-10 DIAGNOSIS — H401413 Capsular glaucoma with pseudoexfoliation of lens, right eye, severe stage: Secondary | ICD-10-CM | POA: Diagnosis not present

## 2018-06-10 DIAGNOSIS — H01024 Squamous blepharitis left upper eyelid: Secondary | ICD-10-CM | POA: Diagnosis not present

## 2018-06-11 NOTE — Progress Notes (Signed)
COMMUNITY PALLIATIVE CARE RN NOTE  PATIENT NAME: [name] DOB: 1921/03/18 MRN: [id number]  PRIMARY CARE PROVIDER: Wenda Low, MD  RESPONSIBLE PARTY:  Acct ID - Guarantor Home Phone Work Phone Relationship Acct Type  [id number] [name]  Self P/F     [address], [place], [place] [address]    PLAN OF CARE and INTERVENTION:  1. ADVANCE CARE PLANNING/GOALS OF CARE: Remain in her home for as long as possible and avoid hospitalizations 2. PATIENT/CAREGIVER EDUCATION: Reinforced Safe Mobility/Transfers 3. DISEASE STATUS: Joint visit made with Palliative Care SW, Lynn Duffy. Patient sitting on the couch upon arrival. Very pleasant and conversational. She has some occasional word finding difficulties. Denies pain or any discomfort at this time. Continues with Tylenol at bedtime. One episode of dizziness reported over the past month. Meclizine taken and effective. She recently was seen by a Dermatologist. A pre-cancerous area to her L upper mid back was removed and an area on her R hand was frozen. She continues with suspicious area to her R inner lower leg, and was referred to a more Specialized dermatologist for this area. No recent falls. She remains able to ambulate using her walker when sitter is present and propels self in wheelchair when alone. Requires 1 person assistance with bathing and dressing. Intake is normal. Takes medications with Ensure daily. Denies dysphagia. Will continue to monitor.    HISTORY OF PRESENT ILLNESS:  This is a 84 yo female who resides at home. She has a hired Actuary during the day and again in the evening. Palliative Care Team continues to follow patient. Next visit scheduled in 1 month.   CODE STATUS: DNR  ADVANCED DIRECTIVES: Y MOST FORM: no PPS: 40%   PHYSICAL EXAM:   VITALS: Today's Vitals   06/06/18 1045  BP: 130/68  Pulse: (!) 53  Resp: 16  Temp: (!) 97 F (36.1 C)  TempSrc: Temporal  SpO2: 97%  PainSc: 0-No pain    LUNGS:  clear to auscultation  CARDIAC: Cor Brady EXTREMITIES: No edema SKIN: Small circular area noted to L upper mid back from skin removal (covered with bandaid), red, scaly area noted ro R inner lower leg  NEURO: Alert and oriented x 3, pleasant mood, word finding difficulties, generalized weakness, ambulatory with walker and stand by assistance   (Duration of visit and documentation 75 minutes)    Daryl Eastern, RN, BSN

## 2018-06-13 DIAGNOSIS — D0471 Carcinoma in situ of skin of right lower limb, including hip: Secondary | ICD-10-CM | POA: Diagnosis not present

## 2018-06-20 DIAGNOSIS — R5383 Other fatigue: Secondary | ICD-10-CM | POA: Diagnosis not present

## 2018-07-04 ENCOUNTER — Other Ambulatory Visit: Payer: Medicare Other | Admitting: Licensed Clinical Social Worker

## 2018-07-04 ENCOUNTER — Other Ambulatory Visit: Payer: Medicare Other | Admitting: *Deleted

## 2018-07-04 DIAGNOSIS — Z515 Encounter for palliative care: Secondary | ICD-10-CM

## 2018-07-04 NOTE — Progress Notes (Signed)
COMMUNITY PALLIATIVE CARE SW NOTE  PATIENT NAME: [name] DOB: [birth year] MRN: [id number]  PRIMARY CARE PROVIDER: Wenda Low, MD  RESPONSIBLE PARTY:  Acct ID - Guarantor Home Phone Work Phone Relationship Acct Type  [id number] [name]  Self P/F     [place], [name], [place] [address]     PLAN OF CARE and INTERVENTIONS:             1. GOALS OF CARE/ ADVANCE CARE PLANNING:  Patient's goal is to remain in her home and not be hospitalized.  She has a DNR. 2. SOCIAL/EMOTIONAL/SPIRITUAL ASSESSMENT/ INTERVENTIONS:  SW and Palliative Care RN, Daryl Eastern, met with patient in her home along with her private caregiver, [name].  Patient was lying on the couch after receiving her eye treatment.  [name] stated patient had a UTI recently which was treated.  Patient appeared slightly slow to respond to questions during this visit.  She reports having some benign cancerous cells removed from her leg.  She did not appear concerned when asked directly.  She denies pain and said she is ready for what comes next. 3. PATIENT/CAREGIVER EDUCATION/ COPING:  Patient copes by expressing her feelings openly. 4. PERSONAL EMERGENCY PLAN:  Patient has a life alert pendant.  Her daughter checks on her frequently and she has private caregivers most of the day. 5. COMMUNITY RESOURCES COORDINATION/ HEALTH CARE NAVIGATION:  Private caregivers. 6. FINANCIAL/LEGAL CONCERNS/INTERVENTIONS:  None.     SOCIAL HX:  Social History   Tobacco Use  . Smoking status: Never Smoker  . Smokeless tobacco: Never Used  Substance Use Topics  . Alcohol use: No    CODE STATUS:  DNR  ADVANCED DIRECTIVES: Y MOST FORM COMPLETE:  N HOSPICE EDUCATION PROVIDED: Patient is familiar with Hospice. PPS:  Patient's appetite is normal.  She is able to stand independently and use her w/c. Duration of visit and documentation:  75 minutes.      Creola Corn Rahman Ferrall, LCSW

## 2018-07-05 NOTE — Progress Notes (Signed)
COMMUNITY PALLIATIVE CARE RN NOTE  PATIENT NAME: [name] DOB: 1920-09-06 MRN: [id number]  PRIMARY CARE PROVIDER: Wenda Low, MD  RESPONSIBLE PARTY:  Acct ID - Guarantor Home Phone Work Phone Relationship Acct Type  [id number] [name]  Self P/F     [place], [place], [place] [address]    PLAN OF CARE and INTERVENTION:  1. ADVANCE CARE PLANNING/GOALS OF CARE: Remain in her home as long as possible with assistance from hired caregivers 2. PATIENT/CAREGIVER EDUCATION: Reinforced Safe Mobility/Transfers 3. DISEASE STATUS: Joint visit made with Palliative Care SW, Lynn Duffy. Patient is awake and alert. Pleasant mood and engaging. She denies pain. She recently completed a course of antibiotics for a UTI. Hired caregiver started noticing that patient was more lethargic and confused, so she was taken to her MD. She reports that she feels better since she has completed antibiotics. She remains able to transfer to her wheelchair independently. She propels herself in wheelchair when home alone, but will ambulate using her walker when someone is present for safety purposes. She requires assistance with bathing and dressing. Her intake is normal. She reports coughing at times during meals, but does not occur all the time. She recently went to the Dermatologist and had a skin cancer removed from her R lower leg. Area currently covered with a bandaid. Caregiver is cleaning daily and applying Vaseline as ordered. Will continue to monitor.  HISTORY OF PRESENT ILLNESS:  This is a 84 yo female who resides at home with assistance from hired caregivers 2x/day. Palliative Care Team continues to follow patient. Next visit scheduled in 1 month.  CODE STATUS: DNR ADVANCED DIRECTIVES: Y MOST FORM: no PPS: 40%   PHYSICAL EXAM:   VITALS: Today's Vitals   07/04/18 1053  BP: (!) 162/84  Pulse: (!) 53  Resp: 16  Temp: (!) 97.5 F (36.4 C)  TempSrc: Temporal  SpO2: 98%  PainSc: 0-No  pain    LUNGS: clear to auscultation  CARDIAC: Cor Loletha Grayer EXTREMITIES: No edema SKIN: Skin cancer removed on R lower leg, L upper back healed after skin cancer removal  NEURO: Alert and oriented x 2, forgetful, generalized weakness, ambulatory with walker w/standby assistance   (Duration of visit and documentation 75 minutes)    Daryl Eastern, RN, BSN

## 2018-07-15 DIAGNOSIS — E782 Mixed hyperlipidemia: Secondary | ICD-10-CM | POA: Diagnosis not present

## 2018-07-15 DIAGNOSIS — N183 Chronic kidney disease, stage 3 (moderate): Secondary | ICD-10-CM | POA: Diagnosis not present

## 2018-07-15 DIAGNOSIS — I471 Supraventricular tachycardia: Secondary | ICD-10-CM | POA: Diagnosis not present

## 2018-07-15 DIAGNOSIS — K219 Gastro-esophageal reflux disease without esophagitis: Secondary | ICD-10-CM | POA: Diagnosis not present

## 2018-07-15 DIAGNOSIS — E46 Unspecified protein-calorie malnutrition: Secondary | ICD-10-CM | POA: Diagnosis not present

## 2018-07-15 DIAGNOSIS — G459 Transient cerebral ischemic attack, unspecified: Secondary | ICD-10-CM | POA: Diagnosis not present

## 2018-07-15 DIAGNOSIS — E039 Hypothyroidism, unspecified: Secondary | ICD-10-CM | POA: Diagnosis not present

## 2018-07-15 DIAGNOSIS — F324 Major depressive disorder, single episode, in partial remission: Secondary | ICD-10-CM | POA: Diagnosis not present

## 2018-07-15 DIAGNOSIS — G450 Vertebro-basilar artery syndrome: Secondary | ICD-10-CM | POA: Diagnosis not present

## 2018-07-15 DIAGNOSIS — R269 Unspecified abnormalities of gait and mobility: Secondary | ICD-10-CM | POA: Diagnosis not present

## 2018-07-15 DIAGNOSIS — I10 Essential (primary) hypertension: Secondary | ICD-10-CM | POA: Diagnosis not present

## 2018-08-01 ENCOUNTER — Other Ambulatory Visit: Payer: Medicare Other | Admitting: *Deleted

## 2018-08-01 DIAGNOSIS — Z515 Encounter for palliative care: Secondary | ICD-10-CM

## 2018-08-01 NOTE — Progress Notes (Signed)
COMMUNITY PALLIATIVE CARE RN NOTE  PATIENT NAME: [name] DOB: Jan 23, 1921 MRN: [id number]  PRIMARY CARE PROVIDER: Wenda Low, MD  RESPONSIBLE PARTY:  Acct ID - Guarantor Home Phone Work Phone Relationship Acct Type  [id number] [name]  Self P/F     [place], [place], [place] [address]    PLAN OF CARE and INTERVENTION:  1. ADVANCE CARE PLANNING/GOALS OF CARE: Remain in her home and avoid going to the hospital 2. PATIENT/CAREGIVER EDUCATION: Reinforced Safe Mobility and Transfers 3. DISEASE STATUS: Met with patient and her hired caregiver, Vaughan Basta, in patient's home. She is awake and alert, just finishing her breakfast. She denies pain at this time, however does experience some mild back pain on occasion. She continues to take Tylenol at bedtime which she feels is effective. No dyspnea noted. Her only complaint is that some days she feels "lazy" and lack of energy. Denies any recent episodes of dizziness. No recent falls. She remains able to ambulate using her walker when caregivers are present, and propels herself in wheelchair when at home alone to prevent falls. She is able to toilet, dress and feed herself independently. She needs minimum assistance with bathing. Her intake remains normal. She drinks a nutritional supplement each morning and takes her medications with it. She reports that on occasion it feels like something is "stuck" in her throat, but usually after coughing it clears. Her areas of skin cancer to her L upper back and R lower leg have healed. She has a Dermatology appointment on 08/05/18 for follow up. Will continue to monitor.  HISTORY OF PRESENT ILLNESS:  This is a 84 yo female who resides in her home. She has hired caregivers for several hours twice daily to assist with personal care and household chores. Palliative Care Team continues to follow patient. Next visit scheduled in 1 month  CODE STATUS: DNR  ADVANCED DIRECTIVES: Y MOST FORM: no PPS:  40%   PHYSICAL EXAM:   VITALS: Today's Vitals   08/01/18 1054  BP: (!) 168/72  Pulse: (!) 53  Resp: 16  Temp: (!) 97.4 F (36.3 C)  TempSrc: Temporal  SpO2: 98%  PainSc: 0-No pain    LUNGS: clear to auscultation  CARDIAC: Cor Loletha Grayer EXTREMITIES: No edema SKIN: Exposed skin is dry and intact  NEURO: Alert and oriented x 3, forgetful, pleasant mood, ambulatory with walker and 1 person assistance   (Duration of visit and documentation 75 minutes)    Daryl Eastern, RN, BSN

## 2018-08-05 DIAGNOSIS — Z85828 Personal history of other malignant neoplasm of skin: Secondary | ICD-10-CM | POA: Diagnosis not present

## 2018-08-05 DIAGNOSIS — L905 Scar conditions and fibrosis of skin: Secondary | ICD-10-CM | POA: Diagnosis not present

## 2018-09-05 ENCOUNTER — Other Ambulatory Visit: Payer: Medicare Other | Admitting: *Deleted

## 2018-09-05 ENCOUNTER — Other Ambulatory Visit: Payer: Medicare Other | Admitting: Licensed Clinical Social Worker

## 2018-09-05 DIAGNOSIS — Z515 Encounter for palliative care: Secondary | ICD-10-CM

## 2018-09-05 NOTE — Progress Notes (Signed)
COMMUNITY PALLIATIVE CARE RN NOTE  PATIENT NAME: [name] DOB: Jul 31, 1921 MRN: [id number]  PRIMARY CARE PROVIDER: Wenda Low, MD  RESPONSIBLE PARTY:  Acct ID - Guarantor Home Phone Work Phone Relationship Acct Type  [id number] [name]  Self P/F     [place], [place], [place] [address]    PLAN OF CARE and INTERVENTION:  1. ADVANCE CARE PLANNING/GOALS OF CARE: Remain in her home with hired caregivers 2. PATIENT/CAREGIVER EDUCATION: Reinforced Safe Mobility/Transfers and Pain Management 3. DISEASE STATUS: Joint visit made with Palliative Care SW, Lynn Duffy. Met with patient and caregiver in her home. She is sitting on the couch awake and alert. Forgetful at times. She states that she has been experiencing pain in her L hip recently, mainly with movement. She routinely takes Tylenol at bedtime, but has periodically needed during the day. She feels that Tylenol controls her pain adequately. She reports more feelings of fatigue, but overall does not have any major complaints/concerns. No dyspnea. She remains able to ambulate with her walker while someone is present, and transfer and propel self in wheelchair when home alone. She continues to travel outside of her home to beauty and nail appointments and family gatherings. She has hired caregivers for several hours during the am and pm. Her intake remains normal. No dysphagia reported. Taking medications without difficulty with Ensure. She is sleeping well during the night. She had a recent appointment with her Dermatologist. The areas that were removed on her R lower leg and back have healed. She has a follow up appointment with her PCP next month. Will continue to monitor.  HISTORY OF PRESENT ILLNESS:  This is a 85 yo female who resides at home. Palliative Care Team continues to follow patient. Will continue to visit monthly and PRN.  CODE STATUS: DNR ADVANCED DIRECTIVES: Y MOST FORM: no PPS: 50%   PHYSICAL EXAM:    VITALS: Today's Vitals   09/05/18 1049  BP: (!) 163/66  Pulse: (!) 52  Resp: 16  Temp: (!) 97.1 F (36.2 C)  TempSrc: Temporal  SpO2: 97%  PainSc: 0-No pain    LUNGS: clear to auscultation  CARDIAC: Cor Loletha Grayer EXTREMITIES: No edema SKIN: No skin issues noted or reported  NEURO: Alert and oriented x 3, forgetful, pleasant mood, ambulatory with walker   (Duration of visit and documentation 75 minutes)    Daryl Eastern, RN, BSN

## 2018-09-05 NOTE — Progress Notes (Signed)
COMMUNITY PALLIATIVE CARE SW NOTE  PATIENT NAME: [name] DOB: Aug 27, 1920 MRN: [id number]  PRIMARY CARE PROVIDER: Wenda Low, MD  RESPONSIBLE PARTY:  Acct ID - Guarantor Home Phone Work Phone Relationship Acct Type  [id number] [name]  Self P/F     [place], [place] [address]     PLAN OF CARE and INTERVENTIONS:             1. GOALS OF CARE/ ADVANCE CARE PLANNING:  Patient wishes to remain in her home.  She does not wish to be hospitalized.  She has a DNR. 2. SOCIAL/EMOTIONAL/SPIRITUAL ASSESSMENT/ INTERVENTIONS: SW and Palliative Care RN, Daryl Eastern, met with patient and her private caregiver, [name], in her home.  She was sitting on her couch alert.  Her voice was soft and she was not as animated as she has been on previous visits.  She reports increased tiredness and hip pain.  She feels her pain medication is sufficient at this time.  Provided supportive counseling and active listening while patient discussed her family.  3. PATIENT/CAREGIVER EDUCATION/ COPING:  Patient and caregiver express their feelings openly. 4. PERSONAL EMERGENCY PLAN:  She has a life alert pendant.  She family checks on her often.  Patient will lie down if she feels dizzy or fatigued. 5. COMMUNITY RESOURCES COORDINATION/ HEALTH CARE NAVIGATION:  Patient has private caregivers during the day. 6. FINANCIAL/LEGAL CONCERNS/INTERVENTIONS:  None.     SOCIAL HX:  Social History   Tobacco Use  . Smoking status: Never Smoker  . Smokeless tobacco: Never Used  Substance Use Topics  . Alcohol use: No    CODE STATUS:  DNR ADVANCED DIRECTIVES:  Y MOST FORM COMPLETE:  N HOSPICE EDUCATION PROVIDED:  Provided education regarding Hospice eligibility.  PPS:  Patient reports that her appetite is normal.  She can stand independently.  She propels herself in her w/c. Duration of visit and documentation:  75 minutes.      Creola Corn Seanmichael Salmons, LCSW

## 2018-10-03 ENCOUNTER — Other Ambulatory Visit: Payer: Medicare Other | Admitting: *Deleted

## 2018-10-03 ENCOUNTER — Other Ambulatory Visit: Payer: Medicare Other | Admitting: Licensed Clinical Social Worker

## 2018-10-03 DIAGNOSIS — Z515 Encounter for palliative care: Secondary | ICD-10-CM

## 2018-10-03 NOTE — Progress Notes (Signed)
COMMUNITY PALLIATIVE CARE SW NOTE  PATIENT NAME: [name] DOB: 05-11-1921 MRN: [id number]  PRIMARY CARE PROVIDER: Wenda Low, MD  RESPONSIBLE PARTY:  Acct ID - Guarantor Home Phone Work Phone Relationship Acct Type  [id number] [name]  Self P/F     [name], [name], [name] [address]     PLAN OF CARE and INTERVENTIONS:             1. GOALS OF CARE/ ADVANCE CARE PLANNING:  Goal is for patient to remain at home and not be hospitalized.  She has a DNR. 2. SOCIAL/EMOTIONAL/SPIRITUAL ASSESSMENT/ INTERVENTIONS:  SW and RN, [name], met with patient in her home, along with her private caregiver, [name].  Patient was still in her pajamas and sitting on her couch.  She was alert and oriented.  Patient denied pain.  Discussed patient's activities over the past month.  She became more talkative when discussing her son-in-law's mother, who is currently in the hospital.  She expressed concern for her and the rest of the family.  SW provided active listening and supportive counseling.  She appeared to respond positively to life review. 3. PATIENT/CAREGIVER EDUCATION/ COPING:  Patient copes by expressing her feelings openly. 4. PERSONAL EMERGENCY PLAN:  Patient has a life alert pendant.  If she feels dizzy, she will lie down. 5. COMMUNITY RESOURCES COORDINATION/ HEALTH CARE NAVIGATION:  Patient has private caregivers during the day and is alone at night. 6. FINANCIAL/LEGAL CONCERNS/INTERVENTIONS:  None.     SOCIAL HX:  Social History   Tobacco Use  . Smoking status: Never Smoker  . Smokeless tobacco: Never Used  Substance Use Topics  . Alcohol use: No    CODE STATUS:  DNR ADVANCED DIRECTIVES: Y MOST FORM COMPLETYE:  N HOSPICE EDUCATION PROVIDED: N PPS:  Patient reports her appetite is normal.  She can pivot and propel herself in her w/c. Duration of visit and documentation:  75 minutes.      Marissa Corn Ross Bender, LCSW

## 2018-10-07 NOTE — Progress Notes (Signed)
COMMUNITY PALLIATIVE CARE RN NOTE  PATIENT NAME: [name] DOB: 1920/10/21 MRN: [id number]  PRIMARY CARE PROVIDER: Wenda Low, MD  RESPONSIBLE PARTY:  Acct ID - Guarantor Home Phone Work Phone Relationship Acct Type  [id number] [name]  Self P/F     [place], [place], [place] [address]    PLAN OF CARE and INTERVENTION:  1. ADVANCE CARE PLANNING/GOALS OF CARE: She wants to remain in her home for as long as possible. She is a DNR. 2. PATIENT/CAREGIVER EDUCATION: Reinforced Safe Mobility/Transfers and Pain Management 3. DISEASE STATUS: Joint visit made with Palliative Care SW, Lynn Duffy. Met with patient and hired caregiver in her home. She is awake and alert, pleasant and engaging. Forgetful at times. She denies pain while at rest, however with standing she c/o pain in her L hip radiating down her L leg. She is taking Tylenol, which she states is helpful. She has had steroid injections in her lower back in the past and will speak with PCP regarding whether he feels an injection could possible help the worsening pain in her hip. She has an upcoming appointment with her PCP on 2/24. She remains able to ambulate using her walker with supervision. She requires minimal assistance with bathing and dressing. She remains able to feed herself independently. When at home alone, she transfers to her wheelchair and propels herself around her home. She reports not sleeping well last pm, unsure why. She normally does not have any difficulties. Her intake is normal. She continues to take her medications whole with Boost. Denies dysphagia. Will continue to monitor.   HISTORY OF PRESENT ILLNESS:  This is a 85 yo female who resides in her home. She has hired caregivers during the day. Palliative Care Team continues to follow patient. Next visit scheduled in 1 month.  CODE STATUS: DNR ADVANCED DIRECTIVES: Y MOST FORM: no PPS: 50%   PHYSICAL EXAM:   VITALS: Today's Vitals   10/03/18 2255  BP: (!) 160/74  Pulse: 63  Resp: 16  Temp: 97.6 F (36.4 C)  TempSrc: Temporal  SpO2: 93%  PainSc: 0-No pain    LUNGS: clear to auscultation  CARDIAC: Cor RRR EXTREMITIES: No edema SKIN: Exposed skin is dry and intact  NEURO: Alert and oriented x 3, forgetful, ambulatory with walker w/supervision   (Duration of visit and documentation 90 minutes)    Daryl Eastern, RN, BSN

## 2018-10-29 DIAGNOSIS — F324 Major depressive disorder, single episode, in partial remission: Secondary | ICD-10-CM | POA: Diagnosis not present

## 2018-10-29 DIAGNOSIS — D649 Anemia, unspecified: Secondary | ICD-10-CM | POA: Diagnosis not present

## 2018-10-29 DIAGNOSIS — R269 Unspecified abnormalities of gait and mobility: Secondary | ICD-10-CM | POA: Diagnosis not present

## 2018-10-29 DIAGNOSIS — N183 Chronic kidney disease, stage 3 (moderate): Secondary | ICD-10-CM | POA: Diagnosis not present

## 2018-10-29 DIAGNOSIS — Q2733 Arteriovenous malformation of digestive system vessel: Secondary | ICD-10-CM | POA: Diagnosis not present

## 2018-10-29 DIAGNOSIS — I10 Essential (primary) hypertension: Secondary | ICD-10-CM | POA: Diagnosis not present

## 2018-10-29 DIAGNOSIS — G450 Vertebro-basilar artery syndrome: Secondary | ICD-10-CM | POA: Diagnosis not present

## 2018-10-29 DIAGNOSIS — I471 Supraventricular tachycardia: Secondary | ICD-10-CM | POA: Diagnosis not present

## 2018-10-29 DIAGNOSIS — E782 Mixed hyperlipidemia: Secondary | ICD-10-CM | POA: Diagnosis not present

## 2018-10-29 DIAGNOSIS — Z1389 Encounter for screening for other disorder: Secondary | ICD-10-CM | POA: Diagnosis not present

## 2018-10-29 DIAGNOSIS — E46 Unspecified protein-calorie malnutrition: Secondary | ICD-10-CM | POA: Diagnosis not present

## 2018-10-29 DIAGNOSIS — Z0000 Encounter for general adult medical examination without abnormal findings: Secondary | ICD-10-CM | POA: Diagnosis not present

## 2018-10-29 DIAGNOSIS — E039 Hypothyroidism, unspecified: Secondary | ICD-10-CM | POA: Diagnosis not present

## 2018-10-29 DIAGNOSIS — M810 Age-related osteoporosis without current pathological fracture: Secondary | ICD-10-CM | POA: Diagnosis not present

## 2018-11-07 ENCOUNTER — Other Ambulatory Visit: Payer: Self-pay

## 2018-11-07 ENCOUNTER — Other Ambulatory Visit: Payer: Medicare Other | Admitting: *Deleted

## 2018-11-07 DIAGNOSIS — Z515 Encounter for palliative care: Secondary | ICD-10-CM

## 2018-11-13 NOTE — Progress Notes (Signed)
COMMUNITY PALLIATIVE CARE RN NOTE  PATIENT NAME: [name] DOB: August 31, 1920 MRN: [id number]  PRIMARY CARE PROVIDER: Wenda Low, MD  RESPONSIBLE PARTY:  Acct ID - Guarantor Home Phone Work Phone Relationship Acct Type  [id number] [name]626 [phone]  Self P/F     [place], [place], [place] [address]    PLAN OF CARE and INTERVENTION:  1. ADVANCE CARE PLANNING/GOALS OF CARE: She wants to remain in her home with hired caregivers. She is a DNR. 2. PATIENT/CAREGIVER EDUCATION: Reinforced Safe Mobility and Pain Management 3. DISEASE STATUS: Met with patient and her caregiver in patient's home. She is sitting up on the couch finishing her breakfast and medications. She denies pain at this time, however does experience lower back pain at times. She was also experiencing pain in her L leg during last visit, however she states this pain has improved. She continues to take Tylenol at bedtime and PRN during the day. She had a recent appointment with her PCP. Her Hgb was in the 8 range and she was started on Iron supplements. Denies any issues with constipation. She remains able to ambulate using her walker when caregiver is not present, and using her wheelchair to propel self in her home when she is alone. No recent falls. Denies dizziness or recent need for PRN Meclizine. Her intake remains normal. Denies dysphagia. She does report feeling more tired at times, but overall is ok. Will continue to monitor.  HISTORY OF PRESENT ILLNESS:  This is a 85 yo female who resides in her home with hired caregivers during the day. Palliative Care Team continues to follow patient. Next visit scheduled in 1 month.  CODE STATUS: DNR  ADVANCED DIRECTIVES: Y MOST FORM: no PPS: 50%   PHYSICAL EXAM:   LUNGS: clear to auscultation  CARDIAC: Cor RRR EXTREMITIES: No edema SKIN: Exposed skin is dry and intact; denies any skin issues  NEURO: Alert and oriented x 3, forgetful, generalized weakness, ambulatory  with walker w/ stand by assistance   (Duration of visit and documentation 60 minutes)    Daryl Eastern, RN BSN

## 2018-11-27 ENCOUNTER — Telehealth: Payer: Self-pay

## 2018-11-27 ENCOUNTER — Other Ambulatory Visit: Payer: Self-pay

## 2018-11-27 ENCOUNTER — Other Ambulatory Visit: Payer: Medicare Other | Admitting: Adult Health Nurse Practitioner

## 2018-11-27 DIAGNOSIS — Z515 Encounter for palliative care: Secondary | ICD-10-CM

## 2018-11-27 NOTE — Telephone Encounter (Signed)
Message received that patient's daughter,[name], called reporting that patient has been vomiting with severe diarrhea x 2 days. Phone call returned to [place] [facility]. Visit scheduled with Amy NP via Telehealth for today at 1:30pm

## 2018-11-27 NOTE — Progress Notes (Signed)
[facility] Consult Note Telephone: [phone]  Fax: [phone]  PATIENT NAME: [name] DOB: 02/10/1921 MRN: [id number]  PRIMARY CARE PROVIDER:   Wenda Low, MD  REFERRING PROVIDER:  Wenda Low, MD [address] [place], [place] [address]  RESPONSIBLE PARTY:   [name], daughter [phone]  Due to the COVID-19 crisis, this visit was done via telemedicine and it was initiated and consent by this patient and or family.  Telehealth visit via Zoom with patient and daughter.    RECOMMENDATIONS and PLAN:  1.  Diarrhea/vomiting.  Patient started having diarrhea early Monday morning and felt like it is was improving on Tuesday but started worsening on Tuesday night.  Have tried immodium, which patient usually takes one daily for IBS symptoms. Even at increased frequency this did not help.  Called PCP and received prescription for Lomotil and this has not slowed the diarrhea either. Patient has had some vomiting and not able to hold down much food right now.  Denies fever, cough, SOB.  No known sick contacts.  Daughter has gotten her some pedialyte to help prevent dehydration.  Encouraged to drink as much fluid as possible to prevent dehydration and having to go to hospital.  Patient does not have history of diverticulitis or Chron's but concerned of possible diverticulitis if antibiotics may help. Though due to circumstances related to coronavirus, do not recommend going out to hospital for ER visit or getting the imaging that could show what is going on. Also instructed patient and daughter that if this is a stomach virus that it may have to run its course but to continue drinking as much as possible to prevent dehydration and something like desitin or A&D ointment can help with the rawness she is experiencing from having so much diarrhea.  Also complains of burning in the esophagus which is most likely related to the  vomiting.  2.  Dysuria.  Patient states that she does not feel like she has been urinating since this started.  Denies flank pain or abdominal pain.  As this is a telehealth visit, unable to perform physical exam to evaluate for suprapubic distention or CVA tenderness.  Patient states that "there is something wrong with my kidneys."  Does state that she has been going to the bathroom so much that she can't tell if she has been urinating but she does not feel like she has been urinating as much.   Have called Dr. Glenna Durand office and left detailed message about above concerns.  Left in message that daughter would like a return call about any changes to plan of care and left her name and number as well as my name and number for any more details or questions.  Also indicated that daughter stated that she thought their pharmacy, [facility], closed at 4pm.  I spent 40 minutes providing this consultation,  from 1:20 to 2:00. More than 50% of the time in this consultation was spent coordinating communication.   HISTORY OF PRESENT ILLNESS:  [name] is a 85 y.o. year old female with multiple medical problems including hypothyroidism, HTN, anemia,skin cancer. Palliative Care was asked to help address goals of care.   CODE STATUS: DNR  PPS: 50% HOSPICE ELIGIBILITY/DIAGNOSIS: TBD  PHYSICAL EXAM:   General:  Patient sitting up in wheelchair in NAD.  Looks frail and tired  PAST MEDICAL HISTORY:  Past Medical History:  Diagnosis Date   Anemia  Anginal pain ([facility])    Arthritis    "all over; doesn't seem to bother me much" (08/22/2013)   Chronic lower back pain    Dysrhythmia    "irregular heart beat"    Exertional shortness of breath    GERD (gastroesophageal reflux disease)    Hypertension    Hypothyroidism    Migraines    "used to"   Mixed hyperlipidemia    Osteoporosis    Paroxysmal supraventricular tachycardia ([facility])    Skin cancer    "couple taken off right leg"  (08/22/2013)    SOCIAL HX:  Social History   Tobacco Use   Smoking status: Never Smoker   Smokeless tobacco: Never Used  Substance Use Topics   Alcohol use: No    ALLERGIES:  Allergies  Allergen Reactions   Codeine Other (See Comments)    "feel like I'm floating"   Other Other (See Comments)    All pain meds cause lethargy and disorientation     PERTINENT MEDICATIONS:  Outpatient Encounter Medications as of 11/27/2018  Medication Sig   acetaminophen (TYLENOL) 500 MG tablet Take 2 tablets (1,000 mg total) by mouth 3 (three) times daily. X 3 DAYS   bimatoprost (LUMIGAN) 0.01 % SOLN Place 1 drop into both eyes at bedtime.   BIOTIN PO Take 1 tablet by mouth daily.   bisacodyl (DULCOLAX) 10 MG suppository Place 1 suppository (10 mg total) rectally daily as needed for moderate constipation.   brimonidine (ALPHAGAN P) 0.1 % SOLN Place 1 drop into the left eye 3 (three) times daily.   Calcium Citrate-Vitamin D (CALCITRATE) 315-250 MG-UNIT TABS Take 2 tablets by mouth 2 (two) times daily.   diltiazem (DILACOR XR) 120 MG 24 hr capsule Take 120 mg by mouth every evening.    dorzolamide-timolol (COSOPT) 22.3-6.8 MG/ML ophthalmic solution Place 1 drop into the left eye 2 (two) times daily.   feeding supplement, ENSURE ENLIVE, (ENSURE ENLIVE) LIQD Take 237 mLs by mouth 2 (two) times daily between meals.   ferrous sulfate 325 (65 FE) MG EC tablet Take 325 mg by mouth every evening.   fexofenadine (ALLEGRA) 180 MG tablet Take 180 mg by mouth daily.   gemfibrozil (LOPID) 600 MG tablet Take 600 mg by mouth 2 (two) times daily before a meal.   levothyroxine (SYNTHROID, LEVOTHROID) 50 MCG tablet Take 25-50 mcg by mouth See admin instructions. Takes 1/2 tab on even days Takes 1 tab on odd days   lisinopril (PRINIVIL,ZESTRIL) 20 MG tablet Take 20 mg by mouth daily.   metoprolol (LOPRESSOR) 50 MG tablet Take 50 mg by mouth 2 (two) times daily.   Omega-3 Fatty Acids (FISH OIL) 1000 MG  CAPS Take 1 capsule by mouth 2 (two) times daily.   omeprazole (PRILOSEC) 20 MG capsule Take 20 mg by mouth daily.   polyethylene glycol (MIRALAX / GLYCOLAX) packet Take 17 g by mouth daily.   Rivaroxaban (XARELTO) 15 MG TABS tablet Take 15 mg by mouth 2 (two) times daily with a meal.   senna-docusate (SENOKOT-S) 8.6-50 MG tablet Take 1 tablet by mouth 2 (two) times daily.   sertraline (ZOLOFT) 100 MG tablet Take 100 mg by mouth at bedtime.    VITAMIN D, CHOLECALCIFEROL, PO Take 1 tablet by mouth every evening.   No facility-administered encounter medications on file as of 11/27/2018.      Mylinda Brook Jenetta Downer, NP

## 2018-11-28 ENCOUNTER — Encounter (HOSPITAL_COMMUNITY): Payer: Self-pay | Admitting: Emergency Medicine

## 2018-11-28 ENCOUNTER — Observation Stay (HOSPITAL_COMMUNITY): Payer: Medicare Other

## 2018-11-28 ENCOUNTER — Other Ambulatory Visit: Payer: Self-pay

## 2018-11-28 ENCOUNTER — Inpatient Hospital Stay (HOSPITAL_COMMUNITY)
Admission: EM | Admit: 2018-11-28 | Discharge: 2018-12-11 | DRG: 683 | Disposition: A | Discharge status: Skilled Nursing Facility | Payer: Medicare Other | Attending: Family Medicine | Admitting: Family Medicine

## 2018-11-28 DIAGNOSIS — D696 Thrombocytopenia, unspecified: Secondary | ICD-10-CM | POA: Diagnosis present

## 2018-11-28 DIAGNOSIS — Z886 Allergy status to analgesic agent status: Secondary | ICD-10-CM

## 2018-11-28 DIAGNOSIS — R0989 Other specified symptoms and signs involving the circulatory and respiratory systems: Secondary | ICD-10-CM

## 2018-11-28 DIAGNOSIS — E860 Dehydration: Secondary | ICD-10-CM | POA: Diagnosis present

## 2018-11-28 DIAGNOSIS — K8050 Calculus of bile duct without cholangitis or cholecystitis without obstruction: Secondary | ICD-10-CM | POA: Diagnosis present

## 2018-11-28 DIAGNOSIS — N179 Acute kidney failure, unspecified: Secondary | ICD-10-CM | POA: Diagnosis not present

## 2018-11-28 DIAGNOSIS — J9811 Atelectasis: Secondary | ICD-10-CM | POA: Diagnosis not present

## 2018-11-28 DIAGNOSIS — R531 Weakness: Secondary | ICD-10-CM | POA: Diagnosis not present

## 2018-11-28 DIAGNOSIS — Z96642 Presence of left artificial hip joint: Secondary | ICD-10-CM | POA: Diagnosis present

## 2018-11-28 DIAGNOSIS — R109 Unspecified abdominal pain: Secondary | ICD-10-CM

## 2018-11-28 DIAGNOSIS — D7389 Other diseases of spleen: Secondary | ICD-10-CM | POA: Diagnosis present

## 2018-11-28 DIAGNOSIS — Z791 Long term (current) use of non-steroidal anti-inflammatories (NSAID): Secondary | ICD-10-CM

## 2018-11-28 DIAGNOSIS — Z66 Do not resuscitate: Secondary | ICD-10-CM | POA: Diagnosis not present

## 2018-11-28 DIAGNOSIS — K828 Other specified diseases of gallbladder: Secondary | ICD-10-CM | POA: Diagnosis present

## 2018-11-28 DIAGNOSIS — K5909 Other constipation: Secondary | ICD-10-CM | POA: Diagnosis present

## 2018-11-28 DIAGNOSIS — Z7902 Long term (current) use of antithrombotics/antiplatelets: Secondary | ICD-10-CM

## 2018-11-28 DIAGNOSIS — E872 Acidosis: Secondary | ICD-10-CM | POA: Diagnosis present

## 2018-11-28 DIAGNOSIS — Z87898 Personal history of other specified conditions: Secondary | ICD-10-CM

## 2018-11-28 DIAGNOSIS — D638 Anemia in other chronic diseases classified elsewhere: Secondary | ICD-10-CM | POA: Diagnosis present

## 2018-11-28 DIAGNOSIS — Z9841 Cataract extraction status, right eye: Secondary | ICD-10-CM

## 2018-11-28 DIAGNOSIS — E782 Mixed hyperlipidemia: Secondary | ICD-10-CM | POA: Diagnosis present

## 2018-11-28 DIAGNOSIS — B9689 Other specified bacterial agents as the cause of diseases classified elsewhere: Secondary | ICD-10-CM | POA: Diagnosis present

## 2018-11-28 DIAGNOSIS — E039 Hypothyroidism, unspecified: Secondary | ICD-10-CM | POA: Diagnosis present

## 2018-11-28 DIAGNOSIS — B961 Klebsiella pneumoniae [K. pneumoniae] as the cause of diseases classified elsewhere: Secondary | ICD-10-CM | POA: Diagnosis present

## 2018-11-28 DIAGNOSIS — I10 Essential (primary) hypertension: Secondary | ICD-10-CM | POA: Diagnosis present

## 2018-11-28 DIAGNOSIS — E876 Hypokalemia: Secondary | ICD-10-CM | POA: Diagnosis present

## 2018-11-28 DIAGNOSIS — B9620 Unspecified Escherichia coli [E. coli] as the cause of diseases classified elsewhere: Secondary | ICD-10-CM | POA: Diagnosis present

## 2018-11-28 DIAGNOSIS — R1310 Dysphagia, unspecified: Secondary | ICD-10-CM | POA: Diagnosis present

## 2018-11-28 DIAGNOSIS — Z961 Presence of intraocular lens: Secondary | ICD-10-CM | POA: Diagnosis present

## 2018-11-28 DIAGNOSIS — I959 Hypotension, unspecified: Secondary | ICD-10-CM | POA: Diagnosis not present

## 2018-11-28 DIAGNOSIS — R112 Nausea with vomiting, unspecified: Secondary | ICD-10-CM | POA: Diagnosis not present

## 2018-11-28 DIAGNOSIS — M545 Low back pain: Secondary | ICD-10-CM | POA: Diagnosis present

## 2018-11-28 DIAGNOSIS — K6289 Other specified diseases of anus and rectum: Secondary | ICD-10-CM | POA: Diagnosis present

## 2018-11-28 DIAGNOSIS — N390 Urinary tract infection, site not specified: Secondary | ICD-10-CM | POA: Diagnosis present

## 2018-11-28 DIAGNOSIS — A084 Viral intestinal infection, unspecified: Secondary | ICD-10-CM

## 2018-11-28 DIAGNOSIS — Z888 Allergy status to other drugs, medicaments and biological substances status: Secondary | ICD-10-CM

## 2018-11-28 DIAGNOSIS — R933 Abnormal findings on diagnostic imaging of other parts of digestive tract: Secondary | ICD-10-CM | POA: Diagnosis present

## 2018-11-28 DIAGNOSIS — D649 Anemia, unspecified: Secondary | ICD-10-CM

## 2018-11-28 DIAGNOSIS — I471 Supraventricular tachycardia: Secondary | ICD-10-CM | POA: Diagnosis not present

## 2018-11-28 DIAGNOSIS — F329 Major depressive disorder, single episode, unspecified: Secondary | ICD-10-CM | POA: Diagnosis present

## 2018-11-28 DIAGNOSIS — E861 Hypovolemia: Secondary | ICD-10-CM | POA: Diagnosis present

## 2018-11-28 DIAGNOSIS — M810 Age-related osteoporosis without current pathological fracture: Secondary | ICD-10-CM | POA: Diagnosis present

## 2018-11-28 DIAGNOSIS — E878 Other disorders of electrolyte and fluid balance, not elsewhere classified: Secondary | ICD-10-CM | POA: Diagnosis present

## 2018-11-28 DIAGNOSIS — R05 Cough: Secondary | ICD-10-CM

## 2018-11-28 DIAGNOSIS — K219 Gastro-esophageal reflux disease without esophagitis: Secondary | ICD-10-CM | POA: Diagnosis present

## 2018-11-28 DIAGNOSIS — Z885 Allergy status to narcotic agent status: Secondary | ICD-10-CM

## 2018-11-28 DIAGNOSIS — Z85828 Personal history of other malignant neoplasm of skin: Secondary | ICD-10-CM

## 2018-11-28 DIAGNOSIS — R5381 Other malaise: Secondary | ICD-10-CM | POA: Diagnosis not present

## 2018-11-28 DIAGNOSIS — I209 Angina pectoris, unspecified: Secondary | ICD-10-CM | POA: Diagnosis present

## 2018-11-28 DIAGNOSIS — I739 Peripheral vascular disease, unspecified: Secondary | ICD-10-CM | POA: Diagnosis present

## 2018-11-28 DIAGNOSIS — G8929 Other chronic pain: Secondary | ICD-10-CM | POA: Diagnosis present

## 2018-11-28 DIAGNOSIS — Z79890 Hormone replacement therapy (postmenopausal): Secondary | ICD-10-CM

## 2018-11-28 DIAGNOSIS — K831 Obstruction of bile duct: Secondary | ICD-10-CM

## 2018-11-28 DIAGNOSIS — R1111 Vomiting without nausea: Secondary | ICD-10-CM | POA: Diagnosis not present

## 2018-11-28 DIAGNOSIS — R059 Cough, unspecified: Secondary | ICD-10-CM

## 2018-11-28 DIAGNOSIS — Z79899 Other long term (current) drug therapy: Secondary | ICD-10-CM

## 2018-11-28 LAB — CBC WITH DIFFERENTIAL/PLATELET
Abs Immature Granulocytes: 0.03 10*3/uL (ref 0.00–0.07)
Basophils Absolute: 0 10*3/uL (ref 0.0–0.1)
Basophils Relative: 0 %
Eosinophils Absolute: 0.1 10*3/uL (ref 0.0–0.5)
Eosinophils Relative: 1 %
HCT: 34.4 % — ABNORMAL LOW (ref 36.0–46.0)
Hemoglobin: 10.8 g/dL — ABNORMAL LOW (ref 12.0–15.0)
Immature Granulocytes: 0 %
Lymphocytes Relative: 16 %
Lymphs Abs: 1.3 10*3/uL (ref 0.7–4.0)
MCH: 30.5 pg (ref 26.0–34.0)
MCHC: 31.4 g/dL (ref 30.0–36.0)
MCV: 97.2 fL (ref 80.0–100.0)
Monocytes Absolute: 0.7 10*3/uL (ref 0.1–1.0)
Monocytes Relative: 10 %
Neutro Abs: 5.6 10*3/uL (ref 1.7–7.7)
Neutrophils Relative %: 73 %
Platelets: 142 10*3/uL — ABNORMAL LOW (ref 150–400)
RBC: 3.54 MIL/uL — ABNORMAL LOW (ref 3.87–5.11)
RDW: 16.3 % — ABNORMAL HIGH (ref 11.5–15.5)
WBC: 7.8 10*3/uL (ref 4.0–10.5)
nRBC: 0 % (ref 0.0–0.2)

## 2018-11-28 LAB — BASIC METABOLIC PANEL
Anion gap: 14 (ref 5–15)
BUN: 86 mg/dL — ABNORMAL HIGH (ref 8–23)
CO2: 17 mmol/L — ABNORMAL LOW (ref 22–32)
Calcium: 10.1 mg/dL (ref 8.9–10.3)
Chloride: 104 mmol/L (ref 98–111)
Creatinine, Ser: 3.25 mg/dL — ABNORMAL HIGH (ref 0.44–1.00)
GFR calc Af Amer: 13 mL/min — ABNORMAL LOW (ref >60)
GFR calc non Af Amer: 12 mL/min — ABNORMAL LOW (ref >60)
Glucose, Bld: 103 mg/dL — ABNORMAL HIGH (ref 70–99)
Potassium: 3.5 mmol/L (ref 3.5–5.1)
Sodium: 135 mmol/L (ref 135–145)

## 2018-11-28 LAB — HEPATIC FUNCTION PANEL
ALT: 9 U/L (ref 0–44)
AST: 19 U/L (ref 15–41)
Albumin: 3.6 g/dL (ref 3.5–5.0)
Alkaline Phosphatase: 76 U/L (ref 38–126)
Bilirubin, Direct: 0.2 mg/dL (ref 0.0–0.2)
Indirect Bilirubin: 0.5 mg/dL (ref 0.3–0.9)
Total Bilirubin: 0.7 mg/dL (ref 0.3–1.2)
Total Protein: 6.8 g/dL (ref 6.5–8.1)

## 2018-11-28 LAB — CBC
HCT: 35.5 % — ABNORMAL LOW (ref 36.0–46.0)
Hemoglobin: 10.8 g/dL — ABNORMAL LOW (ref 12.0–15.0)
MCH: 29 pg (ref 26.0–34.0)
MCHC: 30.4 g/dL (ref 30.0–36.0)
MCV: 95.2 fL (ref 80.0–100.0)
Platelets: 142 10*3/uL — ABNORMAL LOW (ref 150–400)
RBC: 3.73 MIL/uL — ABNORMAL LOW (ref 3.87–5.11)
RDW: 16.1 % — ABNORMAL HIGH (ref 11.5–15.5)
WBC: 7.9 10*3/uL (ref 4.0–10.5)
nRBC: 0 % (ref 0.0–0.2)

## 2018-11-28 LAB — LIPASE, BLOOD: Lipase: 80 U/L — ABNORMAL HIGH (ref 11–51)

## 2018-11-28 LAB — LACTIC ACID, PLASMA: Lactic Acid, Venous: 0.9 mmol/L (ref 0.5–1.9)

## 2018-11-28 MED ORDER — SODIUM CHLORIDE 0.9 % IV BOLUS
1000.0000 mL | Freq: Once | INTRAVENOUS | Status: AC
  Administered 2018-11-28: 1000 mL via INTRAVENOUS

## 2018-11-28 MED ORDER — BRIMONIDINE TARTRATE 0.15 % OP SOLN
1.0000 [drp] | Freq: Three times a day (TID) | OPHTHALMIC | Status: DC
  Administered 2018-11-29 – 2018-12-11 (×38): 1 [drp] via OPHTHALMIC
  Filled 2018-11-28: qty 5

## 2018-11-28 MED ORDER — CLOPIDOGREL BISULFATE 75 MG PO TABS
75.0000 mg | ORAL_TABLET | ORAL | Status: DC
  Administered 2018-11-30 – 2018-12-10 (×6): 75 mg via ORAL
  Filled 2018-11-28 (×8): qty 1

## 2018-11-28 MED ORDER — METOPROLOL TARTRATE 50 MG PO TABS
50.0000 mg | ORAL_TABLET | Freq: Two times a day (BID) | ORAL | Status: DC
  Administered 2018-11-29 – 2018-12-10 (×25): 50 mg via ORAL
  Filled 2018-11-28 (×25): qty 1

## 2018-11-28 MED ORDER — DORZOLAMIDE HCL-TIMOLOL MAL 2-0.5 % OP SOLN
1.0000 [drp] | Freq: Two times a day (BID) | OPHTHALMIC | Status: DC
  Administered 2018-11-29 – 2018-12-11 (×26): 1 [drp] via OPHTHALMIC
  Filled 2018-11-28: qty 10

## 2018-11-28 MED ORDER — LEVOTHYROXINE SODIUM 25 MCG PO TABS: 25.0000 ug | ORAL_TABLET | ORAL | Status: DC

## 2018-11-28 MED ORDER — LOPERAMIDE HCL 2 MG PO CAPS
2.0000 mg | ORAL_CAPSULE | ORAL | Status: DC | PRN
  Administered 2018-11-29 – 2018-12-02 (×3): 2 mg via ORAL
  Filled 2018-11-28 (×3): qty 1

## 2018-11-28 MED ORDER — ONDANSETRON HCL 4 MG/2ML IJ SOLN: 4.0000 mg | Freq: Four times a day (QID) | INTRAMUSCULAR | Status: DC | PRN

## 2018-11-28 MED ORDER — FERROUS SULFATE 325 (65 FE) MG PO TABS
325.0000 mg | ORAL_TABLET | Freq: Every evening | ORAL | Status: DC
  Administered 2018-11-29 – 2018-12-10 (×12): 325 mg via ORAL
  Filled 2018-11-28 (×11): qty 1

## 2018-11-28 MED ORDER — LATANOPROST 0.005 % OP SOLN
1.0000 [drp] | Freq: Every day | OPHTHALMIC | Status: DC
  Administered 2018-11-29 – 2018-12-10 (×12): 1 [drp] via OPHTHALMIC
  Filled 2018-11-28: qty 2.5

## 2018-11-28 MED ORDER — LEVOTHYROXINE SODIUM 25 MCG PO TABS
25.0000 ug | ORAL_TABLET | ORAL | Status: DC
  Administered 2018-11-29 – 2018-12-11 (×7): 25 ug via ORAL
  Filled 2018-11-28 (×8): qty 1

## 2018-11-28 MED ORDER — ACETAMINOPHEN 325 MG PO TABS
650.0000 mg | ORAL_TABLET | Freq: Four times a day (QID) | ORAL | Status: DC | PRN
  Administered 2018-12-08 – 2018-12-11 (×2): 650 mg via ORAL
  Filled 2018-11-28 (×3): qty 2

## 2018-11-28 MED ORDER — LEVOTHYROXINE SODIUM 50 MCG PO TABS
50.0000 ug | ORAL_TABLET | ORAL | Status: DC
  Administered 2018-11-30 – 2018-12-10 (×6): 50 ug via ORAL
  Filled 2018-11-28 (×8): qty 1

## 2018-11-28 MED ORDER — ACETAMINOPHEN 650 MG RE SUPP: 650.0000 mg | Freq: Four times a day (QID) | RECTAL | Status: DC | PRN

## 2018-11-28 MED ORDER — ENSURE ENLIVE PO LIQD
237.0000 mL | Freq: Two times a day (BID) | ORAL | Status: DC
  Administered 2018-11-29 – 2018-12-11 (×21): 237 mL via ORAL

## 2018-11-28 MED ORDER — GEMFIBROZIL 600 MG PO TABS
600.0000 mg | ORAL_TABLET | Freq: Two times a day (BID) | ORAL | Status: DC
  Administered 2018-11-29 – 2018-12-02 (×8): 600 mg via ORAL
  Filled 2018-11-28 (×10): qty 1

## 2018-11-28 MED ORDER — HEPARIN SODIUM (PORCINE) 5000 UNIT/ML IJ SOLN
5000.0000 [IU] | Freq: Three times a day (TID) | INTRAMUSCULAR | Status: DC
  Administered 2018-11-29 – 2018-12-11 (×37): 5000 [IU] via SUBCUTANEOUS
  Filled 2018-11-28 (×37): qty 1

## 2018-11-28 MED ORDER — PANTOPRAZOLE SODIUM 40 MG PO TBEC
40.0000 mg | DELAYED_RELEASE_TABLET | Freq: Every day | ORAL | Status: DC
  Administered 2018-11-29 – 2018-12-01 (×3): 40 mg via ORAL
  Filled 2018-11-28 (×3): qty 1

## 2018-11-28 MED ORDER — SODIUM CHLORIDE 0.9 % IV SOLN
INTRAVENOUS | Status: AC
  Administered 2018-11-29 (×2): via INTRAVENOUS

## 2018-11-28 MED ORDER — LORATADINE 10 MG PO TABS: 10.0000 mg | ORAL_TABLET | Freq: Every day | ORAL | Status: DC | PRN

## 2018-11-28 MED ORDER — SERTRALINE HCL 100 MG PO TABS
100.0000 mg | ORAL_TABLET | Freq: Every day | ORAL | Status: DC
  Administered 2018-11-29 – 2018-12-10 (×13): 100 mg via ORAL
  Filled 2018-11-28 (×15): qty 1

## 2018-11-28 NOTE — H&P (Signed)
History and Physical    [name] KVQ:[id number] DOB: 10/20/1920 DOA: 11/28/2018  PCP: Wenda Low, MD Patient coming from: Home  Chief Complaint: Generalized weakness, diarrhea, vomiting  HPI: [name] is a 85 y.o. female with medical history significant of anemia, hypertension, hypothyroidism, GERD, hyperlipidemia, paroxysmal SVT presenting to the hospital for evaluation of generalized weakness, nausea, vomiting, and nonbloody diarrhea x1 week.  Patient states her symptoms started initially with diarrhea and then she started vomiting.  She vomited for 2 to 3 days but then vomiting resolved.  Currently continues to feel nauseous.  She has not been able to eat or drink much.  Denies any fevers, chills, chest pain, shortness of breath, or abdominal pain.  States she coughs sometimes at night or in the daytime after eating but there has been no recent change in her chronic cough.  Denies any recent travel, sick contacts, or exposure to any individual with confirmed COVID-19.  Review of Systems: As per HPI otherwise 10 point review of systems negative.  Past Medical History:  Diagnosis Date  . Anemia   . Anginal pain ([facility])   . Arthritis    "all over; doesn't seem to bother me much" (08/22/2013)  . Chronic lower back pain   . Dysrhythmia    "irregular heart beat"   . Exertional shortness of breath   . GERD (gastroesophageal reflux disease)   . Hypertension   . Hypothyroidism   . Migraines    "used to"  . Mixed hyperlipidemia   . Osteoporosis   . Paroxysmal supraventricular tachycardia ([facility])   . Skin cancer    "couple taken off right leg" (08/22/2013)    Past Surgical History:  Procedure Laterality Date  . ABDOMINAL HYSTERECTOMY    . ANTERIOR APPROACH HEMI HIP ARTHROPLASTY Left 10/18/2016   Procedure: ANTERIOR APPROACH LEFT HEMI HIP ARTHROPLASTY;  Surgeon: Leandrew Koyanagi, MD;  Location: [facility];  Service: Orthopedics;  Laterality: Left;  . APPENDECTOMY  08/22/2013  .  BACK SURGERY    . CATARACT EXTRACTION W/ INTRAOCULAR LENS  IMPLANT, BILATERAL    . LAPAROSCOPIC APPENDECTOMY N/A 08/22/2013   Procedure: APPENDECTOMY LAPAROSCOPIC;  Surgeon: Imogene Burn. [name], MD;  Location: [facility];  Service: General;  Laterality: N/A;  . LUMBAR [place] SURGERY  2008  . THROAT SURGERY  1940's; ?   "cysts removed"      reports that she has never smoked. She has never used smokeless tobacco. She reports that she does not drink alcohol or use drugs.  Allergies  Allergen Reactions  . Codeine Other (See Comments)    "I feel like I'm floating"   . Gabapentin Other (See Comments)    Cannot be on for a good length of time- causes a lot of lethargy  . Other Other (See Comments)    All pain meds cause lethargy and disorientation    History reviewed. No pertinent family history.  Prior to Admission medications   Medication Sig Start Date End Date Taking? Authorizing Provider  acetaminophen (TYLENOL) 500 MG tablet Take 2 tablets (1,000 mg total) by mouth 3 (three) times daily. X 3 DAYS 10/20/16   Rai, Ripudeep K, MD  bimatoprost (LUMIGAN) 0.01 % SOLN Place 1 drop into both eyes at bedtime.    [provider]  BIOTIN PO Take 1 tablet by mouth daily.    [provider]  bisacodyl (DULCOLAX) 10 MG suppository Place 1 suppository (10 mg total) rectally daily as needed for moderate constipation. 10/20/16  Rai, Ripudeep K, MD  brimonidine (ALPHAGAN P) 0.1 % SOLN Place 1 drop into the left eye 3 (three) times daily. 05/07/14   [provider]  Calcium Citrate-Vitamin D (CALCITRATE) 315-250 MG-UNIT TABS Take 2 tablets by mouth 2 (two) times daily.    [provider]  diltiazem (DILACOR XR) 120 MG 24 hr capsule Take 120 mg by mouth every evening.     [provider]  dorzolamide-timolol (COSOPT) 22.3-6.8 MG/ML ophthalmic solution Place 1 drop into the left eye 2 (two) times daily. 05/07/14   [provider]  feeding supplement, ENSURE ENLIVE,  (ENSURE ENLIVE) LIQD Take 237 mLs by mouth 2 (two) times daily between meals. 10/20/16   Rai, Ripudeep K, MD  ferrous sulfate 325 (65 FE) MG EC tablet Take 325 mg by mouth every evening.    [provider]  fexofenadine (ALLEGRA) 180 MG tablet Take 180 mg by mouth daily.    [provider]  gemfibrozil (LOPID) 600 MG tablet Take 600 mg by mouth 2 (two) times daily before a meal.    [provider]  levothyroxine (SYNTHROID, LEVOTHROID) 50 MCG tablet Take 25-50 mcg by mouth See admin instructions. Takes 1/2 tab on even days Takes 1 tab on odd days    [provider]  lisinopril (PRINIVIL,ZESTRIL) 20 MG tablet Take 20 mg by mouth daily.    [provider]  metoprolol (LOPRESSOR) 50 MG tablet Take 50 mg by mouth 2 (two) times daily.    [provider]  Omega-3 Fatty Acids (FISH OIL) 1000 MG CAPS Take 1 capsule by mouth 2 (two) times daily.    [provider]  omeprazole (PRILOSEC) 20 MG capsule Take 20 mg by mouth daily.    [provider]  polyethylene glycol (MIRALAX / GLYCOLAX) packet Take 17 g by mouth daily. 10/21/16   Rai, Vernelle Emerald, MD  Rivaroxaban (XARELTO) 15 MG TABS tablet Take 15 mg by mouth 2 (two) times daily with a meal.    [provider]  senna-docusate (SENOKOT-S) 8.6-50 MG tablet Take 1 tablet by mouth 2 (two) times daily. 10/20/16   Rai, Vernelle Emerald, MD  sertraline (ZOLOFT) 100 MG tablet Take 100 mg by mouth at bedtime.     [provider]  VITAMIN D, CHOLECALCIFEROL, PO Take 1 tablet by mouth every evening.    [provider]    Physical Exam: Vitals:   11/28/18 2100 11/28/18 2115 11/28/18 2130 11/28/18 2145  BP: (!) 123/47  (!) 127/49   Pulse: 64 65 66 65  Resp: 14 17 19 16   Temp:      TempSrc:      SpO2: 98% 95% 99% 96%  Weight:      Height:        Physical Exam  Constitutional: She is oriented to person, place, and time.  Frail elderly female Appears extremely lethargic   HENT:  Head: Normocephalic.  Dry mucous membranes  Eyes: Right eye exhibits no discharge. Left eye exhibits no discharge.  Neck: Neck supple.  Cardiovascular: Normal rate, regular rhythm and intact distal pulses.  Pulmonary/Chest: Effort normal. No respiratory distress. She has no wheezes. She has rales.  Rales appreciated at the left lung base  Abdominal: Soft. Bowel sounds are normal. She exhibits no distension. There is no abdominal tenderness. There is no rebound and no guarding.  Musculoskeletal:        General: No edema.  Neurological: She is alert and oriented to person, place, and  time.  Skin: Skin is warm and dry. She is not diaphoretic.     Labs on Admission: I have personally reviewed following labs and imaging studies  CBC: Recent Labs  Lab 11/28/18 1928 11/28/18 2145  WBC 7.9 7.8  NEUTROABS  --  5.6  HGB 10.8* 10.8*  HCT 35.5* 34.4*  MCV 95.2 97.2  PLT 142* 102*   Basic Metabolic Panel: Recent Labs  Lab 11/28/18 1928  NA 135  K 3.5  CL 104  CO2 17*  GLUCOSE 103*  BUN 86*  CREATININE 3.25*  CALCIUM 10.1   GFR: Estimated Creatinine Clearance: 7.1 mL/min (A) (by C-G formula based on SCr of 3.25 mg/dL (H)). Liver Function Tests: Recent Labs  Lab 11/28/18 2003  AST 19  ALT 9  ALKPHOS 76  BILITOT 0.7  PROT 6.8  ALBUMIN 3.6   Recent Labs  Lab 11/28/18 2145  LIPASE 80*   No results for input(s): AMMONIA in the last 168 hours. Coagulation Profile: No results for input(s): INR, PROTIME in the last 168 hours. Cardiac Enzymes: No results for input(s): CKTOTAL, CKMB, CKMBINDEX, TROPONINI in the last 168 hours. BNP (last 3 results) No results for input(s): PROBNP in the last 8760 hours. HbA1C: No results for input(s): HGBA1C in the last 72 hours. CBG: No results for input(s): GLUCAP in the last 168 hours. Lipid Profile: No results for input(s): CHOL, HDL, LDLCALC, TRIG, CHOLHDL, LDLDIRECT in the last 72 hours. Thyroid Function Tests: No  results for input(s): TSH, T4TOTAL, FREET4, T3FREE, THYROIDAB in the last 72 hours. Anemia Panel: No results for input(s): VITAMINB12, FOLATE, FERRITIN, TIBC, IRON, RETICCTPCT in the last 72 hours. Urine analysis:    Component Value Date/Time   COLORURINE YELLOW 08/22/2013 0825   APPEARANCEUR CLEAR 08/22/2013 0825   LABSPEC 1.015 08/22/2013 0825   PHURINE 5.0 08/22/2013 0825   GLUCOSEU NEGATIVE 08/22/2013 0825   HGBUR NEGATIVE 08/22/2013 0825   BILIRUBINUR NEGATIVE 08/22/2013 0825   KETONESUR NEGATIVE 08/22/2013 0825   PROTEINUR NEGATIVE 08/22/2013 0825   UROBILINOGEN 0.2 08/22/2013 0825   NITRITE NEGATIVE 08/22/2013 0825   LEUKOCYTESUR TRACE (A) 08/22/2013 0825    Radiological Exams on Admission: No results found.  EKG: Independently reviewed.  Sinus rhythm, nonspecific T wave abnormality.  Not suggestive of ACS.  Assessment/Plan Principal Problem:   AKI (acute kidney injury) ([facility]) Active Problems:   Viral gastroenteritis   Generalized weakness   Chronic anemia   Thrombocytopenia ([facility])   AKI BUN 86.  Creatinine 3.2, baseline 1.1 two years ago.  Likely prerenal due to severe dehydration from GI loss and concomitant home ACE inhibitor use. -IV fluid hydration -Renal ultrasound -Avoid nephrotoxic agents/contrast.  Hold home ACE inhibitor. -Continue to monitor BMP -UA pending  Viral gastroenteritis Presenting with 1 week history of nausea, vomiting, and diarrhea.  No complaints of abdominal pain.  Afebrile and no leukocytosis.  Hemodynamically stable.  Lactic acid normal.  LFTs normal.  Abdominal exam benign. -IV fluid hydration -IV Zofran PRN nausea -Loperamide PRN diarrhea -GI pathogen panel -Check lipase level  Generalized weakness Suspect related to severe dehydration and viral illness.  Patient reports chronic intermittent cough; no recent change.  Low suspicion for COVID-19 given no fever or shortness of breath.  Noted to have rales at the left lung base on exam.   However, no tachypnea or hypoxia.  No signs of respiratory distress.   -Chest x-ray -Add differential to CBC to check for lymphopenia -Test for SARS-CoV-2 if chest x-ray with infiltrates or  labs suggestive of lymphopenia.  Keep patient on droplet and contact precautions at this time. -IV fluid hydration and management of viral gastroenteritis as mentioned above -PT evaluation  Chronic anemia -Stable.  Hemoglobin 10.8, above baseline compared to labs done 2 years ago.  No recent labs in the chart.  Mild thrombocytopenia Platelet count 142,000.  No signs of active bleeding. -Continue to monitor CBC  Normal anion gap metabolic acidosis Bicarb 17, anion gap 14.  Likely related to diarrhea. -IV fluid hydration -Continue monitor BMP  Hypertension -Continue home metoprolol  Hyperlipidemia  Peripheral vascular disease -Continue home Plavix  Hypothyroidism -Continue home Synthroid  GERD -Continue PPI  Depression -Continue Zoloft  DVT prophylaxis: Subcutaneous heparin Code Status: Patient wishes to be DNR and DNI. Family Communication: No family available. Disposition Plan: Anticipate discharge after clinical improvement. Consults called: None Admission status: Observation, MedSurg   This chart was dictated using voice recognition software.  Despite best efforts to proofread, errors can occur which can change the documentation meaning.  Shela Leff MD [facility] Pager [phone]  If 7PM-7AM, please contact night-coverage [email or web address] Password [email or web address]  11/28/2018, 10:08 PM

## 2018-11-28 NOTE — ED Provider Notes (Signed)
[facility] EMERGENCY DEPARTMENT Provider Note   CSN: [id number] Arrival date & time: 11/28/18  1919    History   Chief Complaint Chief Complaint  Patient presents with  . Weakness    HPI [name] is a 85 y.o. female.     HPI 85 year old female with past medical history as below here with generalized weakness.  Patient states that for the last week, she has had severe, generalized weakness and diarrhea.  Symptoms started over the weekend with acute onset of watery, nonbloody diarrhea.  She had persistent diarrhea since then, as well as mild nausea loss of appetite.  She did vomit 3 times, but has not vomited since then.  She denies any associated abdominal pain or cramping.  No fevers or chills.  She endorses progressive worsening, generalized weakness and is having difficulty getting around her house.  She has lightheadedness upon standing.  No chest pain or shortness of breath.  No cough or sputum production.  No known sick contacts.  No suspicious food intake.  Past Medical History:  Diagnosis Date  . Anemia   . Anginal pain ([place])   . Arthritis    "all over; doesn't seem to bother me much" (08/22/2013)  . Chronic lower back pain   . Dysrhythmia    "irregular heart beat"   . Exertional shortness of breath   . GERD (gastroesophageal reflux disease)   . Hypertension   . Hypothyroidism   . Migraines    "used to"  . Mixed hyperlipidemia   . Osteoporosis   . Paroxysmal supraventricular tachycardia ([place])   . Skin cancer    "couple taken off right leg" (08/22/2013)    Patient Active Problem List   Diagnosis Date Noted  . Chronic bilateral low back pain 10/31/2016  . Closed subcapital fracture of femur, left, initial encounter ([place]) 10/16/2016  . Peripheral vascular disease ([place]) 10/16/2016  . Essential hypertension 10/16/2016  . Hypothyroidism, acquired 10/16/2016  . Dry eye 04/21/2015  . H/O surgical procedure 05/25/2014  . 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 Pseudoaphakia 04/10/2014  . Acute appendicitis 08/22/2013    Past Surgical History:  Procedure Laterality Date  . ABDOMINAL HYSTERECTOMY    . ANTERIOR APPROACH HEMI HIP ARTHROPLASTY Left 10/18/2016   Procedure: ANTERIOR APPROACH LEFT HEMI HIP ARTHROPLASTY;  Surgeon: Leandrew Koyanagi, MD;  Location: [facility];  Service: Orthopedics;  Laterality: Left;  . APPENDECTOMY  08/22/2013  . BACK SURGERY    . CATARACT EXTRACTION W/ INTRAOCULAR LENS  IMPLANT, BILATERAL    . LAPAROSCOPIC APPENDECTOMY N/A 08/22/2013   Procedure: APPENDECTOMY LAPAROSCOPIC;  Surgeon: Imogene Burn. [name], MD;  Location: [facility];  Service: General;  Laterality: N/A;  . LUMBAR [place] SURGERY  2008  . THROAT SURGERY  1940's; ?   "cysts removed"      OB History   No obstetric history on file.      Home Medications    Prior to Admission medications   Medication Sig Start Date End Date Taking? Authorizing Provider  acetaminophen (TYLENOL) 500 MG tablet Take 2 tablets (1,000 mg total) by mouth 3 (three) times daily. X 3 DAYS 10/20/16   Rai, Ripudeep K, MD  bimatoprost (LUMIGAN) 0.01 % SOLN Place 1 drop into both eyes at bedtime.    [provider]  BIOTIN PO Take 1 tablet by mouth daily.    [provider]  bisacodyl (DULCOLAX) 10 MG suppository Place 1 suppository (10 mg total) rectally daily as needed for  moderate constipation. 10/20/16   Rai, Ripudeep K, MD  brimonidine (ALPHAGAN P) 0.1 % SOLN Place 1 drop into the left eye 3 (three) times daily. 05/07/14   [provider]  Calcium Citrate-Vitamin D (CALCITRATE) 315-250 MG-UNIT TABS Take 2 tablets by mouth 2 (two) times daily.    [provider]  diltiazem (DILACOR XR) 120 MG 24 hr capsule Take 120 mg by mouth every evening.     [provider]  dorzolamide-timolol (COSOPT) 22.3-6.8 MG/ML ophthalmic solution Place 1 drop into the left eye 2 (two) times daily. 05/07/14   [provider]  feeding supplement,  ENSURE ENLIVE, (ENSURE ENLIVE) LIQD Take 237 mLs by mouth 2 (two) times daily between meals. 10/20/16   Rai, Ripudeep K, MD  ferrous sulfate 325 (65 FE) MG EC tablet Take 325 mg by mouth every evening.    [provider]  fexofenadine (ALLEGRA) 180 MG tablet Take 180 mg by mouth daily.    [provider]  gemfibrozil (LOPID) 600 MG tablet Take 600 mg by mouth 2 (two) times daily before a meal.    [provider]  levothyroxine (SYNTHROID, LEVOTHROID) 50 MCG tablet Take 25-50 mcg by mouth See admin instructions. Takes 1/2 tab on even days Takes 1 tab on odd days    [provider]  lisinopril (PRINIVIL,ZESTRIL) 20 MG tablet Take 20 mg by mouth daily.    [provider]  metoprolol (LOPRESSOR) 50 MG tablet Take 50 mg by mouth 2 (two) times daily.    [provider]  Omega-3 Fatty Acids (FISH OIL) 1000 MG CAPS Take 1 capsule by mouth 2 (two) times daily.    [provider]  omeprazole (PRILOSEC) 20 MG capsule Take 20 mg by mouth daily.    [provider]  polyethylene glycol (MIRALAX / GLYCOLAX) packet Take 17 g by mouth daily. 10/21/16   Rai, Vernelle Emerald, MD  Rivaroxaban (XARELTO) 15 MG TABS tablet Take 15 mg by mouth 2 (two) times daily with a meal.    [provider]  senna-docusate (SENOKOT-S) 8.6-50 MG tablet Take 1 tablet by mouth 2 (two) times daily. 10/20/16   Rai, Vernelle Emerald, MD  sertraline (ZOLOFT) 100 MG tablet Take 100 mg by mouth at bedtime.     [provider]  VITAMIN D, CHOLECALCIFEROL, PO Take 1 tablet by mouth every evening.    [provider]    Family History History reviewed. No pertinent family history.  Social History Social History   Tobacco Use  . Smoking status: Never Smoker  . Smokeless tobacco: Never Used  Substance Use Topics  . Alcohol use: No  . Drug use: No     Allergies   Codeine and Other   Review of Systems Review of Systems  Constitutional: Positive for  fatigue. Negative for chills and fever.  HENT: Negative for congestion and rhinorrhea.   Eyes: Negative for visual disturbance.  Respiratory: Negative for cough, shortness of breath and wheezing.   Cardiovascular: Negative for chest pain and leg swelling.  Gastrointestinal: Positive for diarrhea, nausea and vomiting. Negative for abdominal pain.  Genitourinary: Negative for dysuria and flank pain.  Musculoskeletal: Negative for neck pain and neck stiffness.  Skin: Negative for rash and wound.  Allergic/Immunologic: Negative for immunocompromised state.  Neurological: Positive for weakness. Negative for syncope and headaches.  All other systems reviewed and are negative.    Physical Exam Updated Vital Signs BP (!) 130/48   Pulse 60   Temp  98.2 F (36.8 C) (Oral)   Resp 15   Ht 4\' 11"  (1.499 m)   Wt 49 kg   SpO2 98%   BMI 21.81 kg/m   Physical Exam Vitals signs and nursing note reviewed.  Constitutional:      General: She is not in acute distress.    Appearance: She is well-developed.  HENT:     Head: Normocephalic and atraumatic.     Comments: Markedly dry mucous membranes Eyes:     Conjunctiva/sclera: Conjunctivae normal.  Neck:     Musculoskeletal: Neck supple.  Cardiovascular:     Rate and Rhythm: Normal rate and regular rhythm.     Heart sounds: Normal heart sounds. No murmur. No friction rub.  Pulmonary:     Effort: Pulmonary effort is normal. No respiratory distress.     Breath sounds: Normal breath sounds. No wheezing or rales.  Abdominal:     General: There is no distension.     Palpations: Abdomen is soft.     Tenderness: There is no abdominal tenderness.     Comments: No rebound, guarding. No TTP.   Skin:    General: Skin is warm.     Capillary Refill: Capillary refill takes less than 2 seconds.  Neurological:     Mental Status: She is alert and oriented to person, place, and time.     Motor: No abnormal muscle tone.      ED Treatments / Results   Labs (all labs ordered are listed, but only abnormal results are displayed) Labs Reviewed  BASIC METABOLIC PANEL - Abnormal; Notable for the following components:      Result Value   CO2 17 (*)    Glucose, Bld 103 (*)    BUN 86 (*)    Creatinine, Ser 3.25 (*)    GFR calc non Af Amer 12 (*)    GFR calc Af Amer 13 (*)    All other components within normal limits  CBC - Abnormal; Notable for the following components:   RBC 3.73 (*)    Hemoglobin 10.8 (*)    HCT 35.5 (*)    RDW 16.1 (*)    Platelets 142 (*)    All other components within normal limits  LACTIC ACID, PLASMA  HEPATIC FUNCTION PANEL  URINALYSIS, ROUTINE W REFLEX MICROSCOPIC  LACTIC ACID, PLASMA    EKG None  Radiology No results found.  Procedures Procedures (including critical care time)  Medications Ordered in ED Medications  sodium chloride 0.9 % bolus 1,000 mL (1,000 mLs Intravenous New Bag/Given 11/28/18 2015)     Initial Impression / Assessment and Plan / ED Course  I have reviewed the triage vital signs and the nursing notes.  Pertinent labs & imaging results that were available during my care of the patient were reviewed by me and considered in my medical decision making (see chart for details).        85 year old female here with profound dehydration and subsequent prerenal acute kidney injury in the setting of diarrheal illness.  Patient with no abdominal tenderness on my exam, normal white blood cell count, no reported pain, no fevers, and will hold on CT imaging at this time.  Will admit for hydration and monitoring.  Final Clinical Impressions(s) / ED Diagnoses   Final diagnoses:  AKI (acute kidney injury) [facility])    ED Discharge Orders    None       Duffy Bruce, MD 11/28/18 2100

## 2018-11-28 NOTE — ED Notes (Signed)
Patient transported to Ultrasound 

## 2018-11-28 NOTE — ED Notes (Signed)
Jorge Ny @ [phone] pts daughter would like an update

## 2018-11-28 NOTE — ED Triage Notes (Signed)
BIB [facility] from home with c/o of N/V/D since Monday. Per EMS last vomiting episode was Monday and last Dirrhea yesterday afternoon. Pt with increased weakness but denies cough, SOB, and fevers. 98.2 on arrival. Pt seen by PCP and given meds without relief.

## 2018-11-28 NOTE — ED Notes (Signed)
ED TO INPATIENT HANDOFF REPORT  ED Nurse Name and Phone #:  Freida Busman  [phone]  S Name/Age/Gender Devoria Glassing 85 y.o. female Room/Bed: 016C/016C  Code Status   Code Status: Prior  Home/SNF/Other Home Patient oriented to: self, place, time and situation Is this baseline? Yes   Triage Complete: Triage complete  Chief Complaint dehydration  Triage Note BIB [facility] from home with c/o of N/V/D since Monday. Per EMS last vomiting episode was Monday and last Dirrhea yesterday afternoon. Pt with increased weakness but denies cough, SOB, and fevers. 98.2 on arrival. Pt seen by PCP and given meds without relief.    Allergies Allergies  Allergen Reactions  . Codeine Other (See Comments)    "feel like I'm floating" Other reaction(s): Other Feels like body is floating. "feel like I'm floating"   . Other Other (See Comments)    All pain meds cause lethargy and disorientation    Level of Care/Admitting Diagnosis ED Disposition    ED Disposition Condition Comment   Admit  Hospital Area: [facility] [100100]  Level of Care: Med-Surg [16]  I expect the patient will be discharged within 24 hours: Yes  LOW acuity---Tx typically complete <24 hrs---ACUTE conditions typically can be evaluated <24 hours---LABS likely to return to acceptable levels <24 hours---IS near functional baseline---EXPECTED to return to current living arrangement---NOT newly hypoxic: Meets criteria for 5C-Observation unit  Diagnosis: AKI (acute kidney injury) [facility]) [132440]  Admitting Physician: Shela Leff [[id number]]  Attending Physician: Shela Leff [[id number]]  PT Class (Do Not Modify): Observation [104]  PT Acc Code (Do Not Modify): Observation [10022]       B Medical/Surgery History Past Medical History:  Diagnosis Date  . Anemia   . Anginal pain ([place])   . Arthritis    "all over; doesn't seem to bother me much" (08/22/2013)  . Chronic lower back pain   . Dysrhythmia     "irregular heart beat"   . Exertional shortness of breath   . GERD (gastroesophageal reflux disease)   . Hypertension   . Hypothyroidism   . Migraines    "used to"  . Mixed hyperlipidemia   . Osteoporosis   . Paroxysmal supraventricular tachycardia ([place])   . Skin cancer    "couple taken off right leg" (08/22/2013)   Past Surgical History:  Procedure Laterality Date  . ABDOMINAL HYSTERECTOMY    . ANTERIOR APPROACH HEMI HIP ARTHROPLASTY Left 10/18/2016   Procedure: ANTERIOR APPROACH LEFT HEMI HIP ARTHROPLASTY;  Surgeon: Leandrew Koyanagi, MD;  Location: [facility];  Service: Orthopedics;  Laterality: Left;  . APPENDECTOMY  08/22/2013  . BACK SURGERY    . CATARACT EXTRACTION W/ INTRAOCULAR LENS  IMPLANT, BILATERAL    . LAPAROSCOPIC APPENDECTOMY N/A 08/22/2013   Procedure: APPENDECTOMY LAPAROSCOPIC;  Surgeon: Imogene Burn. [name], MD;  Location: [facility];  Service: General;  Laterality: N/A;  . LUMBAR [place] SURGERY  2008  . THROAT SURGERY  1940's; ?   "cysts removed"      A IV Location/Drains/Wounds Patient Lines/Drains/Airways Status   Active Line/Drains/Airways    Name:   Placement date:   Placement time:   Site:   Days:   Peripheral IV 11/28/18 Left Antecubital   11/28/18    1912    Antecubital   less than 1   Incision (Closed) 10/18/16 Hip   10/18/16    1604     771          Intake/Output Last 24 hours  Intake/Output  Summary (Last 24 hours) at 11/28/2018 2139 Last data filed at 11/28/2018 2123 Gross per 24 hour  Intake 2000 ml  Output -  Net 2000 ml    Labs/Imaging Results for orders placed or performed during the hospital encounter of 11/28/18 (from the past 48 hour(s))  Basic metabolic panel     Status: Abnormal   Collection Time: 11/28/18  7:28 PM  Result Value Ref Range   Sodium 135 135 - 145 mmol/L   Potassium 3.5 3.5 - 5.1 mmol/L   Chloride 104 98 - 111 mmol/L   CO2 17 (L) 22 - 32 mmol/L   Glucose, Bld 103 (H) 70 - 99 mg/dL   BUN 86 (H) 8 - 23 mg/dL   Creatinine, Ser 3.25 (H)  0.44 - 1.00 mg/dL   Calcium 10.1 8.9 - 10.3 mg/dL   GFR calc non Af Amer 12 (L) >60 mL/min   GFR calc Af Amer 13 (L) >60 mL/min   Anion gap 14 5 - 15    Comment: Performed at [facility], [address], [name], [place] [address]  CBC     Status: Abnormal   Collection Time: 11/28/18  7:28 PM  Result Value Ref Range   WBC 7.9 4.0 - 10.5 K/uL   RBC 3.73 (L) 3.87 - 5.11 MIL/uL   Hemoglobin 10.8 (L) 12.0 - 15.0 g/dL   HCT 35.5 (L) 36.0 - 46.0 %   MCV 95.2 80.0 - 100.0 fL   MCH 29.0 26.0 - 34.0 pg   MCHC 30.4 30.0 - 36.0 g/dL   RDW 16.1 (H) 11.5 - 15.5 %   Platelets 142 (L) 150 - 400 K/uL   nRBC 0.0 0.0 - 0.2 %    Comment: Performed at [facility] [address], [place], [place] [address]  Lactic acid, plasma     Status: None   Collection Time: 11/28/18  7:28 PM  Result Value Ref Range   Lactic Acid, Venous 0.9 0.5 - 1.9 mmol/L    Comment: Performed at [facility] [address] [place], [place] [address]  Hepatic function panel     Status: None   Collection Time: 11/28/18  8:03 PM  Result Value Ref Range   Total Protein 6.8 6.5 - 8.1 g/dL   Albumin 3.6 3.5 - 5.0 g/dL   AST 19 15 - 41 U/L   ALT 9 0 - 44 U/L   Alkaline Phosphatase 76 38 - 126 U/L   Total Bilirubin 0.7 0.3 - 1.2 mg/dL   Bilirubin, Direct 0.2 0.0 - 0.2 mg/dL   Indirect Bilirubin 0.5 0.3 - 0.9 mg/dL    Comment: Performed at [facility] [address], [place], [place] [address]   No results found.  Pending Labs Unresulted Labs (From admission, onward)    Start     Ordered   11/28/18 1926  Lactic acid, plasma  Now then every 2 hours,   STAT     11/28/18 1925   11/28/18 1925  Urinalysis, Routine w reflex microscopic  Once,   STAT     11/28/18 1925          Vitals/Pain Today's Vitals   11/28/18 2045 11/28/18 2100 11/28/18 2115 11/28/18 2124  BP:  (!) 123/47    Pulse: 62 64 65   Resp: 14 14 17    Temp:      TempSrc:      SpO2: 97% 98% 95%   Weight:  Height:       PainSc:    8     Isolation Precautions No active isolations  Medications Medications  sodium chloride 0.9 % bolus 1,000 mL (0 mLs Intravenous Stopped 11/28/18 2123)    Mobility walks Low fall risk   Focused Assessments Cardiac Assessment Handoff:  Cardiac Rhythm: Normal sinus rhythm No results found for: CKTOTAL, CKMB, CKMBINDEX, TROPONINI No results found for: DDIMER Does the Patient currently have chest pain? No      R Recommendations: See Admitting Provider Note  Report given to:   Additional Notes:   Per triage note, pt with N/V/D since Monday. Seen by PCP. No N/V/D today, just notable generalized weakness.

## 2018-11-28 NOTE — ED Notes (Signed)
Admitting Provider at bedside. 

## 2018-11-29 DIAGNOSIS — D649 Anemia, unspecified: Secondary | ICD-10-CM | POA: Diagnosis not present

## 2018-11-29 DIAGNOSIS — L304 Erythema intertrigo: Secondary | ICD-10-CM | POA: Diagnosis not present

## 2018-11-29 DIAGNOSIS — R5381 Other malaise: Secondary | ICD-10-CM | POA: Diagnosis not present

## 2018-11-29 DIAGNOSIS — R05 Cough: Secondary | ICD-10-CM | POA: Diagnosis not present

## 2018-11-29 DIAGNOSIS — R1312 Dysphagia, oropharyngeal phase: Secondary | ICD-10-CM | POA: Diagnosis not present

## 2018-11-29 DIAGNOSIS — Z96642 Presence of left artificial hip joint: Secondary | ICD-10-CM | POA: Diagnosis not present

## 2018-11-29 DIAGNOSIS — L89611 Pressure ulcer of right heel, stage 1: Secondary | ICD-10-CM | POA: Diagnosis not present

## 2018-11-29 DIAGNOSIS — K828 Other specified diseases of gallbladder: Secondary | ICD-10-CM | POA: Diagnosis not present

## 2018-11-29 DIAGNOSIS — Z9841 Cataract extraction status, right eye: Secondary | ICD-10-CM | POA: Diagnosis not present

## 2018-11-29 DIAGNOSIS — E860 Dehydration: Secondary | ICD-10-CM | POA: Diagnosis not present

## 2018-11-29 DIAGNOSIS — R531 Weakness: Secondary | ICD-10-CM

## 2018-11-29 DIAGNOSIS — E039 Hypothyroidism, unspecified: Secondary | ICD-10-CM | POA: Diagnosis not present

## 2018-11-29 DIAGNOSIS — H401424 Capsular glaucoma with pseudoexfoliation of lens, left eye, indeterminate stage: Secondary | ICD-10-CM | POA: Diagnosis not present

## 2018-11-29 DIAGNOSIS — M545 Low back pain: Secondary | ICD-10-CM | POA: Diagnosis not present

## 2018-11-29 DIAGNOSIS — J9811 Atelectasis: Secondary | ICD-10-CM | POA: Diagnosis not present

## 2018-11-29 DIAGNOSIS — I10 Essential (primary) hypertension: Secondary | ICD-10-CM | POA: Diagnosis not present

## 2018-11-29 DIAGNOSIS — I471 Supraventricular tachycardia: Secondary | ICD-10-CM | POA: Diagnosis not present

## 2018-11-29 DIAGNOSIS — R933 Abnormal findings on diagnostic imaging of other parts of digestive tract: Secondary | ICD-10-CM | POA: Diagnosis not present

## 2018-11-29 DIAGNOSIS — Z7401 Bed confinement status: Secondary | ICD-10-CM | POA: Diagnosis not present

## 2018-11-29 DIAGNOSIS — N179 Acute kidney failure, unspecified: Secondary | ICD-10-CM | POA: Diagnosis not present

## 2018-11-29 DIAGNOSIS — M1990 Unspecified osteoarthritis, unspecified site: Secondary | ICD-10-CM | POA: Diagnosis not present

## 2018-11-29 DIAGNOSIS — A048 Other specified bacterial intestinal infections: Secondary | ICD-10-CM | POA: Diagnosis not present

## 2018-11-29 DIAGNOSIS — B961 Klebsiella pneumoniae [K. pneumoniae] as the cause of diseases classified elsewhere: Secondary | ICD-10-CM | POA: Diagnosis not present

## 2018-11-29 DIAGNOSIS — E872 Acidosis: Secondary | ICD-10-CM | POA: Diagnosis not present

## 2018-11-29 DIAGNOSIS — K529 Noninfective gastroenteritis and colitis, unspecified: Secondary | ICD-10-CM | POA: Diagnosis not present

## 2018-11-29 DIAGNOSIS — M2550 Pain in unspecified joint: Secondary | ICD-10-CM | POA: Diagnosis not present

## 2018-11-29 DIAGNOSIS — N390 Urinary tract infection, site not specified: Secondary | ICD-10-CM | POA: Diagnosis not present

## 2018-11-29 DIAGNOSIS — R0989 Other specified symptoms and signs involving the circulatory and respiratory systems: Secondary | ICD-10-CM | POA: Diagnosis not present

## 2018-11-29 DIAGNOSIS — D81819 Biotin-dependent carboxylase deficiency, unspecified: Secondary | ICD-10-CM | POA: Diagnosis not present

## 2018-11-29 DIAGNOSIS — Z961 Presence of intraocular lens: Secondary | ICD-10-CM | POA: Diagnosis not present

## 2018-11-29 DIAGNOSIS — F339 Major depressive disorder, recurrent, unspecified: Secondary | ICD-10-CM | POA: Diagnosis not present

## 2018-11-29 DIAGNOSIS — Z79890 Hormone replacement therapy (postmenopausal): Secondary | ICD-10-CM | POA: Diagnosis not present

## 2018-11-29 DIAGNOSIS — E876 Hypokalemia: Secondary | ICD-10-CM | POA: Diagnosis not present

## 2018-11-29 DIAGNOSIS — K6289 Other specified diseases of anus and rectum: Secondary | ICD-10-CM | POA: Diagnosis not present

## 2018-11-29 DIAGNOSIS — K219 Gastro-esophageal reflux disease without esophagitis: Secondary | ICD-10-CM | POA: Diagnosis present

## 2018-11-29 DIAGNOSIS — E861 Hypovolemia: Secondary | ICD-10-CM | POA: Diagnosis present

## 2018-11-29 DIAGNOSIS — K5790 Diverticulosis of intestine, part unspecified, without perforation or abscess without bleeding: Secondary | ICD-10-CM | POA: Diagnosis not present

## 2018-11-29 DIAGNOSIS — M810 Age-related osteoporosis without current pathological fracture: Secondary | ICD-10-CM | POA: Diagnosis not present

## 2018-11-29 DIAGNOSIS — Z79899 Other long term (current) drug therapy: Secondary | ICD-10-CM | POA: Diagnosis not present

## 2018-11-29 DIAGNOSIS — I739 Peripheral vascular disease, unspecified: Secondary | ICD-10-CM | POA: Diagnosis present

## 2018-11-29 DIAGNOSIS — B9620 Unspecified Escherichia coli [E. coli] as the cause of diseases classified elsewhere: Secondary | ICD-10-CM | POA: Diagnosis not present

## 2018-11-29 DIAGNOSIS — K838 Other specified diseases of biliary tract: Secondary | ICD-10-CM | POA: Diagnosis not present

## 2018-11-29 DIAGNOSIS — D696 Thrombocytopenia, unspecified: Secondary | ICD-10-CM

## 2018-11-29 DIAGNOSIS — M6281 Muscle weakness (generalized): Secondary | ICD-10-CM | POA: Diagnosis not present

## 2018-11-29 DIAGNOSIS — A084 Viral intestinal infection, unspecified: Secondary | ICD-10-CM | POA: Diagnosis not present

## 2018-11-29 DIAGNOSIS — D638 Anemia in other chronic diseases classified elsewhere: Secondary | ICD-10-CM | POA: Diagnosis present

## 2018-11-29 DIAGNOSIS — Z66 Do not resuscitate: Secondary | ICD-10-CM | POA: Diagnosis not present

## 2018-11-29 DIAGNOSIS — K8050 Calculus of bile duct without cholangitis or cholecystitis without obstruction: Secondary | ICD-10-CM | POA: Diagnosis not present

## 2018-11-29 DIAGNOSIS — R4182 Altered mental status, unspecified: Secondary | ICD-10-CM | POA: Diagnosis not present

## 2018-11-29 DIAGNOSIS — K8020 Calculus of gallbladder without cholecystitis without obstruction: Secondary | ICD-10-CM | POA: Diagnosis not present

## 2018-11-29 DIAGNOSIS — R2689 Other abnormalities of gait and mobility: Secondary | ICD-10-CM | POA: Diagnosis not present

## 2018-11-29 DIAGNOSIS — G8929 Other chronic pain: Secondary | ICD-10-CM | POA: Diagnosis not present

## 2018-11-29 DIAGNOSIS — R1310 Dysphagia, unspecified: Secondary | ICD-10-CM | POA: Diagnosis present

## 2018-11-29 DIAGNOSIS — E782 Mixed hyperlipidemia: Secondary | ICD-10-CM | POA: Diagnosis not present

## 2018-11-29 LAB — GASTROINTESTINAL PANEL BY PCR, STOOL (REPLACES STOOL CULTURE)

## 2018-11-29 LAB — CBC
HCT: 30.5 % — ABNORMAL LOW (ref 36.0–46.0)
Hemoglobin: 9.4 g/dL — ABNORMAL LOW (ref 12.0–15.0)
MCH: 28.7 pg (ref 26.0–34.0)
MCHC: 30.8 g/dL (ref 30.0–36.0)
MCV: 93 fL (ref 80.0–100.0)
Platelets: 117 10*3/uL — ABNORMAL LOW (ref 150–400)
RBC: 3.28 MIL/uL — ABNORMAL LOW (ref 3.87–5.11)
RDW: 16.2 % — ABNORMAL HIGH (ref 11.5–15.5)
WBC: 6.8 10*3/uL (ref 4.0–10.5)
nRBC: 0 % (ref 0.0–0.2)

## 2018-11-29 LAB — BASIC METABOLIC PANEL
Anion gap: 12 (ref 5–15)
BUN: 87 mg/dL — ABNORMAL HIGH (ref 8–23)
CO2: 16 mmol/L — ABNORMAL LOW (ref 22–32)
Calcium: 9.3 mg/dL (ref 8.9–10.3)
Chloride: 108 mmol/L (ref 98–111)
Creatinine, Ser: 3.04 mg/dL — ABNORMAL HIGH (ref 0.44–1.00)
GFR calc Af Amer: 14 mL/min — ABNORMAL LOW (ref >60)
GFR calc non Af Amer: 12 mL/min — ABNORMAL LOW (ref >60)
Glucose, Bld: 87 mg/dL (ref 70–99)
Potassium: 3.3 mmol/L — ABNORMAL LOW (ref 3.5–5.1)
Sodium: 136 mmol/L (ref 135–145)

## 2018-11-29 MED ORDER — SODIUM CHLORIDE 0.9 % IV SOLN
INTRAVENOUS | Status: DC
  Administered 2018-11-29 – 2018-11-30 (×2): via INTRAVENOUS

## 2018-11-29 MED ORDER — ALUM & MAG HYDROXIDE-SIMETH 200-200-20 MG/5ML PO SUSP
15.0000 mL | ORAL | Status: DC | PRN
  Filled 2018-11-29 (×2): qty 30

## 2018-11-29 MED ORDER — SODIUM BICARBONATE 650 MG PO TABS
650.0000 mg | ORAL_TABLET | Freq: Three times a day (TID) | ORAL | Status: DC
  Administered 2018-11-29: 650 mg via ORAL
  Filled 2018-11-29: qty 1

## 2018-11-29 MED ORDER — POTASSIUM CHLORIDE CRYS ER 20 MEQ PO TBCR
40.0000 meq | EXTENDED_RELEASE_TABLET | Freq: Once | ORAL | Status: AC
  Administered 2018-11-29: 40 meq via ORAL
  Filled 2018-11-29: qty 2

## 2018-11-29 NOTE — Evaluation (Signed)
Physical Therapy Evaluation Patient Details Name: [name] MRN: [id number] DOB: 04-06-1921 Today's Date: 11/29/2018   History of Present Illness  Pt is a 85 y/o female admitted secondary to nausea, vomitting, diarrhea and weakness. Found to be dehydrated and with an AKI and to have Gastroenteritis. PMH including but not limited to hypertension, hypothyroidism, GERD, hyperlipidemia, paroxysmal SVT.     Clinical Impression  Pt presented supine in bed with HOB elevated, awake and willing to participate in therapy session. Prior to admission, pt reported that she ambulated short distances with RW when someone was with her, otherwise uses a manual w/c for mobility when she is alone. Additionally, she requires assistance from an aide for ADLs and IADLs. Pt lives alone in a single level home with two steps to enter. She reported that she could have 24/7 supervision/assistance if needed upon d/c. At the time of evaluation, pt required min A overall for bed mobility and transfers. Pt very limited secondary to fatigue. Pt would continue to benefit from skilled physical therapy services at this time while admitted and after d/c to address the below listed limitations in order to improve overall safety and independence with functional mobility.     Follow Up Recommendations Home health PT;Supervision/Assistance - 24 hour;Other (comment)(if pt's family cannot provide 24/7 supervision, needs SNF)    Equipment Recommendations  None recommended by PT    Recommendations for Other Services       Precautions / Restrictions Precautions Precautions: Fall Restrictions Weight Bearing Restrictions: No      Mobility  Bed Mobility Overal bed mobility: Needs Assistance Bed Mobility: Supine to Sit     Supine to sit: Min assist;HOB elevated     General bed mobility comments: increased time and effort, use of bed rail, min A for trunk elevation; pt able to scoot forwards to EOB without any  assistance  Transfers Overall transfer level: Needs assistance Equipment used: 1 person hand held assist Transfers: Sit to/from [employer] Transfers Sit to Stand: Min assist Stand pivot transfers: Min assist       General transfer comment: increased time and effort, pt with one UE on bed rail and 1HHA with the other UE; assistance needed to fully power into standing and for stability with pivotal movement to chair  Ambulation/Gait             General Gait Details: unable due to fatigue  Stairs            Wheelchair Mobility    Modified Rankin (Stroke Patients Only)       Balance Overall balance assessment: Needs assistance Sitting-balance support: Feet supported Sitting balance-Leahy Scale: Fair     Standing balance support: During functional activity;Single extremity supported;Bilateral upper extremity supported Standing balance-Leahy Scale: Poor                               Pertinent Vitals/Pain Pain Assessment: No/denies pain    Home Living Family/patient expects to be discharged to:: Private residence Living Arrangements: Alone Available Help at Discharge: Family;Personal care attendant;Other (Comment)(pt has an aide from 9:00am-3:00pm (Mon-Fri)) Type of Home: House Home Access: Stairs to enter Entrance Stairs-Rails: Right Entrance Stairs-Number of Steps: 2 Home Layout: One level Home Equipment: Clinical cytogeneticist - 2 wheels;Wheelchair - manual      Prior Function Level of Independence: Needs assistance   Gait / Transfers Assistance Needed: pt ambulates with RW if someone is present in the  home, she uses her w/c when she is alone  ADL's / Homemaking Assistance Needed: requires assistance for bathing and dressing        Hand Dominance        Extremity/Trunk Assessment   Upper Extremity Assessment Upper Extremity Assessment: Generalized weakness    Lower Extremity Assessment Lower Extremity Assessment: Generalized  weakness    Cervical / Trunk Assessment Cervical / Trunk Assessment: Kyphotic  Communication   Communication: HOH  Cognition Arousal/Alertness: Awake/alert Behavior During Therapy: WFL for tasks assessed/performed Overall Cognitive Status: No family/caregiver present to determine baseline cognitive functioning Area of Impairment: Memory;Following commands;Problem solving                     Memory: Decreased short-term memory Following Commands: Follows one step commands with increased time     Problem Solving: Slow processing;Decreased initiation;Difficulty sequencing;Requires verbal cues        General Comments      Exercises     Assessment/Plan    PT Assessment Patient needs continued PT services  PT Problem List Decreased strength;Decreased activity tolerance;Decreased balance;Decreased mobility;Decreased coordination;Decreased cognition;Decreased safety awareness;Decreased knowledge of use of DME;Decreased knowledge of precautions       PT Treatment Interventions DME instruction;Gait training;Stair training;Functional mobility training;Therapeutic activities;Therapeutic exercise;Balance training;Neuromuscular re-education;Patient/family education;Cognitive remediation    PT Goals (Current goals can be found in the Care Plan section)  Acute Rehab PT Goals Patient Stated Goal: "go back home" PT Goal Formulation: With patient Time For Goal Achievement: 12/13/18 Potential to Achieve Goals: Good    Frequency Min 3X/week   Barriers to discharge        Co-evaluation               AM-PAC PT "6 Clicks" Mobility  Outcome Measure Help needed turning from your back to your side while in a flat bed without using bedrails?: A Little Help needed moving from lying on your back to sitting on the side of a flat bed without using bedrails?: A Little Help needed moving to and from a bed to a chair (including a wheelchair)?: A Little Help needed standing up from  a chair using your arms (e.g., wheelchair or bedside chair)?: A Little Help needed to walk in hospital room?: A Little Help needed climbing 3-5 steps with a railing? : A Lot 6 Click Score: 17    End of Session   Activity Tolerance: Patient limited by fatigue Patient left: in chair;with call bell/phone within reach;with chair alarm set Nurse Communication: Mobility status PT Visit Diagnosis: Other abnormalities of gait and mobility (R26.89);Muscle weakness (generalized) (M62.81)    Time: 1610-9604 PT Time Calculation (min) (ACUTE ONLY): 39 min   Charges:   PT Evaluation $PT Eval Moderate Complexity: 1 Mod PT Treatments $Therapeutic Activity: 23-37 mins        Sherie Don, PT, DPT  Acute Rehabilitation Services Pager [phone] Office [place] 11/29/2018, 11:03 AM

## 2018-11-29 NOTE — Progress Notes (Addendum)
Initial Nutrition Assessment   RD working remotely  [place]:   Not applicable  INTERVENTION:    Ensure Enlive po BID, each supplement provides 350 kcal and 20 grams of protein  NUTRITION DIAGNOSIS:   Increased nutrient needs related to acute illness as evidenced by estimated needs  GOAL:   Patient will meet greater than or equal to 90% of their needs  MONITOR:   PO intake, Supplement acceptance, Labs, Weight trends, Skin, I & O's  REASON FOR ASSESSMENT:   Malnutrition Screening Tool  ASSESSMENT:   85 yo Female with history of anemia, HTN, hypothyroidism, paroxysmal SVT presented with generalized weakness, diarrhea and vomiting.    Admit dx include: Rales [R09.89] AKI (acute kidney injury) ([place]) [N17.9]  RD attempted to speak with pt over room phone. Pt did not answer call.  Per nutrition screen, pt eating poorly PTA d/t gastroenteritis. Pt was also severely dehydrated and started on IVF. Home medication list reveals pt drinks Ensure.  Per readings below, pt's weight has been stable since 02/2018. Medications include imodium & ferrous sulfate. Labs reviewed. K 3.3 (L).  NUTRITION - FOCUSED PHYSICAL EXAM:  Unable to complete at this time  Diet Order:   Diet Order            Diet Heart Room service appropriate? No; Fluid consistency: Thin  Diet effective now             EDUCATION NEEDS:   Not appropriate for education at this time  Skin:  Skin Assessment: Reviewed RN Assessment  Last BM:  4/9  Height:   Ht Readings from Last 1 Encounters:  11/28/18 4\' 11"  (1.499 m)   Weight:   Wt Readings from Last 1 Encounters:  11/28/18 49.1 kg   Wt Readings from Last 10 Encounters:  11/28/18 49.1 kg  03/16/18 48.1 kg  10/16/16 49 kg  09/12/13 51.9 kg  08/22/13 62 kg   BMI:  Body mass index is 21.86 kg/m.  Estimated Nutritional Needs:   Kcal:  1200-1400  Protein:  60-75 gm  Fluid:  >/= 1.5 L  Arthur Holms, RD, LDN Pager #:  [phone] After-Hours Pager #: [phone]

## 2018-11-29 NOTE — Progress Notes (Signed)
Patient ID: [name], female   DOB: 03/20/1921, 85 y.o.   MRN: [id number]  PROGRESS NOTE    [name]  [id number]:[id number] DOB: Apr 16, 1921 DOA: 11/28/2018 PCP: Wenda Low, MD   Brief Narrative:  85 year old female with history of anemia, hypertension, hypothyroidism, GERD, hyperlipidemia, paroxysmal SVT presented on 11/28/2018 with generalized weakness, diarrhea and vomiting.  She was found to have acute kidney injury with creatinine of 3.25.  He was started on IV fluids.  Assessment & Plan:   Principal Problem:   AKI (acute kidney injury) ([place]) Active Problems:   Viral gastroenteritis   Generalized weakness   Chronic anemia   Thrombocytopenia (H[facility])  Acute kidney injury -Likely prerenal due to severe dehydration from GI loss and concomitant ACE inhibitor use -Presented with creatinine of 3.25, baseline 1.1 two years ago. -Renal ultrasound was negative for hydronephrosis.  ACE inhibitor on hold. -UA pending. -Creatinine is 3.04 this morning.  Continue IV fluids.  Repeat a.m. labs  Gastroenteritis -Most likely viral.  GI PCR is pending.  LFTs normal.  Abdominal exam is benign.  Lipase only slightly elevated, not consistent with pancreatitis. -Diarrhea slightly improving  Normal anion gap metabolic acidosis -Likely related to diarrhea.  Bicarb is 16 today.  Will start oral sodium bicarb tablets.  Continue IV hydration.  Monitor  Generalized weakness -Due to severe dehydration and probable viral illness. -Chest x-ray was negative for infiltrates.  Unlikely that patient has COVID-19. -DC droplet isolation.  Continue contact isolation until we have GI PCR panel back. -PT evaluation  Hypokalemia -Replace.  Repeat a.m. labs  Chronic anemia--stable.  Hemoglobin 9.4 today  Thrombocytopenia -Questionable cause.  No signs of bleeding.  Monitor  Hypertension--continue metoprolol.  Blood pressure stable  Peripheral vascular disease-continue  Plavix  Hypothyroidism -Continue Synthroid  GERD -Continue PPI  Depression--continue Zoloft    DVT prophylaxis: Subcutaneous heparin Code Status: DNR Family Communication: None at bedside Disposition Plan: Probable discharge in 1 to 2 days if clinically improves and renal function improves Consultants: None  Procedures: None  Antimicrobials: None   Subjective: Patient seen and examined at bedside.  She is a poor historian but feels slightly better and states that her diarrhea is improving.  Objective: Vitals:   11/28/18 2300 11/28/18 2347 11/29/18 0551 11/29/18 0931  BP:  (!) 146/53 116/66 120/63  Pulse: 67 72 66 70  Resp: 17     Temp:  98.7 F (37.1 C) 98.4 F (36.9 C)   TempSrc:  Oral Oral   SpO2: 94% 96% 95%   Weight:  49.1 kg    Height:        Intake/Output Summary (Last 24 hours) at 11/29/2018 1043 Last data filed at 11/29/2018 1027 Gross per 24 hour  Intake 2694.15 ml  Output 2 ml  Net 2692.15 ml   Filed Weights   11/28/18 1923 11/28/18 2347  Weight: 49 kg 49.1 kg    Examination:  General exam: Appears calm and comfortable.  Elderly female.  Poor historian.  Awake and answers some questions. Respiratory system: Bilateral decreased breath sounds at bases Cardiovascular system: S1 & S2 heard, Rate controlled Gastrointestinal system: Abdomen is nondistended, soft and nontender. Normal bowel sounds heard. Extremities: No cyanosis, clubbing, edema    Data Reviewed: I have personally reviewed following labs and imaging studies  CBC: Recent Labs  Lab 11/28/18 1928 11/28/18 2145 11/29/18 0254  WBC 7.9 7.8 6.8  NEUTROABS  --  5.6  --   HGB 10.8* 10.8* 9.4*  HCT  35.5* 34.4* 30.5*  MCV 95.2 97.2 93.0  PLT 142* 142* 660*   Basic Metabolic Panel: Recent Labs  Lab 11/28/18 1928 11/29/18 0254  NA 135 136  K 3.5 3.3*  CL 104 108  CO2 17* 16*  GLUCOSE 103* 87  BUN 86* 87*  CREATININE 3.25* 3.04*  CALCIUM 10.1 9.3   GFR: Estimated  Creatinine Clearance: 7.5 mL/min (A) (by C-G formula based on SCr of 3.04 mg/dL (H)). Liver Function Tests: Recent Labs  Lab 11/28/18 2003  AST 19  ALT 9  ALKPHOS 76  BILITOT 0.7  PROT 6.8  ALBUMIN 3.6   Recent Labs  Lab 11/28/18 2145  LIPASE 80*   No results for input(s): AMMONIA in the last 168 hours. Coagulation Profile: No results for input(s): INR, PROTIME in the last 168 hours. Cardiac Enzymes: No results for input(s): CKTOTAL, CKMB, CKMBINDEX, TROPONINI in the last 168 hours. BNP (last 3 results) No results for input(s): PROBNP in the last 8760 hours. HbA1C: No results for input(s): HGBA1C in the last 72 hours. CBG: No results for input(s): GLUCAP in the last 168 hours. Lipid Profile: No results for input(s): CHOL, HDL, LDLCALC, TRIG, CHOLHDL, LDLDIRECT in the last 72 hours. Thyroid Function Tests: No results for input(s): TSH, T4TOTAL, FREET4, T3FREE, THYROIDAB in the last 72 hours. Anemia Panel: No results for input(s): VITAMINB12, FOLATE, FERRITIN, TIBC, IRON, RETICCTPCT in the last 72 hours. Sepsis Labs: Recent Labs  Lab 11/28/18 1928  LATICACIDVEN 0.9    No results found for this or any previous visit (from the past 240 hour(s)).       Radiology Studies: US Renal  Result Date: 11/28/2018 CLINICAL DATA:  Initial evaluation for acute renal injury. EXAM: RENAL / URINARY TRACT ULTRASOUND COMPLETE COMPARISON:  None available. FINDINGS: Right Kidney: Renal measurements: 7.7 x 3.6 x 4.1 cm = volume: 60.9 mL . Echogenicity within normal limits. Mild diffuse cortical thinning. No mass or hydronephrosis visualized. Left Kidney: Renal measurements: 8.6 x 4.1 x 3.8 cm = volume: 70.6 mL. Echogenicity within normal limits. Mild diffuse cortical thinning. No mass or hydronephrosis visualized. Bladder: Appears normal for degree of bladder distention. Irregular cluster of cysts measuring up to 4.4 cm noted within the spleen. No internal vascularity or solid component.  Partially visualized gallbladder appears somewhat distended. IMPRESSION: 1. Mild diffuse chronic cortical thinning of both kidneys. No hydronephrosis. 2. Incidental 4.4 cm irregular complex cyst versus cluster of cysts within the spleen. 3. Distended gallbladder. Electronically Signed   By: Jeannine Boga M.D.   On: 11/28/2018 22:35   Dg Chest Port 1 View  Result Date: 11/28/2018 CLINICAL DATA:  Rales. Vomiting and diarrhea. Weakness. EXAM: PORTABLE CHEST 1 VIEW COMPARISON:  08/30/2017 FINDINGS: Lung volumes are low. Mild left lung base atelectasis. Borderline cardiomegaly, unchanged from prior. Tortuous atherosclerotic thoracic aorta. Pulmonary vasculature is normal. No consolidation, pleural effusion, or pneumothorax. No acute osseous abnormalities are seen. Incidental note of gaseous gastric distention in the upper abdomen. IMPRESSION: Low lung volumes with mild left lung base atelectasis. Electronically Signed   By: Keith Rake M.D.   On: 11/28/2018 23:03        Scheduled Meds:  brimonidine  1 drop Left Eye TID   [START ON 11/30/2018] clopidogrel  75 mg Oral QODAY   dorzolamide-timolol  1 drop Left Eye BID   feeding supplement (ENSURE ENLIVE)  237 mL Oral BID BM   ferrous sulfate  325 mg Oral QPM   gemfibrozil  600 mg Oral BID  AC   heparin  5,000 Units Subcutaneous Q8H   latanoprost  1 drop Both Eyes QHS   levothyroxine  25 mcg Oral QODAY   [START ON 11/30/2018] levothyroxine  50 mcg Oral QODAY   metoprolol tartrate  50 mg Oral BID   pantoprazole  40 mg Oral Daily   sertraline  100 mg Oral QHS   Continuous Infusions:   LOS: 0 days        Aline August, MD [facility] 11/29/2018, 10:43 AM

## 2018-11-29 NOTE — Progress Notes (Signed)
Patient admitted from ED via bed. Alert and oriented x3. Vs are temp 98.7 oral, pulse 82, blood pressure 146/53 and sp02 96 percent in room air. Iv in place and running fluid. Patient is on telemetry running sinus rhythm. Skin assessment done with another nurse. Patient given instructions about call bell and phone. Bed in low position and call bell in reach.

## 2018-11-30 LAB — URINALYSIS, ROUTINE W REFLEX MICROSCOPIC
Bilirubin Urine: NEGATIVE
Glucose, UA: NEGATIVE mg/dL
Ketones, ur: NEGATIVE mg/dL
Nitrite: NEGATIVE
Protein, ur: 30 mg/dL — AB
Specific Gravity, Urine: 1.013 (ref 1.005–1.030)
pH: 7 (ref 5.0–8.0)

## 2018-11-30 LAB — CBC WITH DIFFERENTIAL/PLATELET
Abs Immature Granulocytes: 0.03 10*3/uL (ref 0.00–0.07)
Basophils Absolute: 0 10*3/uL (ref 0.0–0.1)
Basophils Relative: 0 %
Eosinophils Absolute: 0.1 10*3/uL (ref 0.0–0.5)
Eosinophils Relative: 2 %
HCT: 33 % — ABNORMAL LOW (ref 36.0–46.0)
Hemoglobin: 10.8 g/dL — ABNORMAL LOW (ref 12.0–15.0)
Immature Granulocytes: 0 %
Lymphocytes Relative: 12 %
Lymphs Abs: 0.8 10*3/uL (ref 0.7–4.0)
MCH: 30.6 pg (ref 26.0–34.0)
MCHC: 32.7 g/dL (ref 30.0–36.0)
MCV: 93.5 fL (ref 80.0–100.0)
Monocytes Absolute: 0.7 10*3/uL (ref 0.1–1.0)
Monocytes Relative: 10 %
Neutro Abs: 5.1 10*3/uL (ref 1.7–7.7)
Neutrophils Relative %: 76 %
Platelets: 132 10*3/uL — ABNORMAL LOW (ref 150–400)
RBC: 3.53 MIL/uL — ABNORMAL LOW (ref 3.87–5.11)
RDW: 16.5 % — ABNORMAL HIGH (ref 11.5–15.5)
WBC: 6.8 10*3/uL (ref 4.0–10.5)
nRBC: 0 % (ref 0.0–0.2)

## 2018-11-30 LAB — BASIC METABOLIC PANEL
Anion gap: 12 (ref 5–15)
BUN: 72 mg/dL — ABNORMAL HIGH (ref 8–23)
CO2: 12 mmol/L — ABNORMAL LOW (ref 22–32)
Calcium: 8.8 mg/dL — ABNORMAL LOW (ref 8.9–10.3)
Chloride: 115 mmol/L — ABNORMAL HIGH (ref 98–111)
Creatinine, Ser: 1.87 mg/dL — ABNORMAL HIGH (ref 0.44–1.00)
GFR calc Af Amer: 26 mL/min — ABNORMAL LOW (ref >60)
GFR calc non Af Amer: 22 mL/min — ABNORMAL LOW (ref >60)
Glucose, Bld: 79 mg/dL (ref 70–99)
Potassium: 3.9 mmol/L (ref 3.5–5.1)
Sodium: 139 mmol/L (ref 135–145)

## 2018-11-30 LAB — MAGNESIUM: Magnesium: 1.5 mg/dL — ABNORMAL LOW (ref 1.7–2.4)

## 2018-11-30 MED ORDER — MAGNESIUM SULFATE 2 GM/50ML IV SOLN
2.0000 g | Freq: Once | INTRAVENOUS | Status: AC
  Administered 2018-11-30: 2 g via INTRAVENOUS

## 2018-11-30 MED ORDER — SODIUM BICARBONATE 8.4 % IV SOLN
INTRAVENOUS | Status: DC
  Administered 2018-11-30 – 2018-12-02 (×5): via INTRAVENOUS
  Filled 2018-11-30 (×7): qty 100

## 2018-11-30 MED ORDER — LIP MEDEX EX OINT
1.0000 "application " | TOPICAL_OINTMENT | CUTANEOUS | Status: DC | PRN
  Filled 2018-11-30: qty 7

## 2018-11-30 NOTE — Progress Notes (Signed)
Patient ID: [name], female   DOB: 07-Dec-1920, 85 y.o.   MRN: [id number]  PROGRESS NOTE    [name]  PZW:[id number] DOB: November 29, 1920 DOA: 11/28/2018 PCP: Wenda Low, MD   Brief Narrative:  85 year old female with history of anemia, hypertension, hypothyroidism, GERD, hyperlipidemia, paroxysmal SVT presented on 11/28/2018 with generalized weakness, diarrhea and vomiting.  She was found to have acute kidney injury with creatinine of 3.25.  She was started on IV fluids.  Assessment & Plan:   Principal Problem:   AKI (acute kidney injury) ([place]) Active Problems:   Viral gastroenteritis   Generalized weakness   Chronic anemia   Thrombocytopenia ([facility])  Acute kidney injury -Likely prerenal due to severe dehydration from GI loss and concomitant ACE inhibitor use -Presented with creatinine of 3.25, baseline 1.1 two years ago. -Renal ultrasound was negative for hydronephrosis.  ACE inhibitor on hold. -UA pending. -Creatinine is 1.87 this morning.  Switch IV fluids to bicarb drip.  Repeat a.m. labs  Gastroenteritis -Most likely viral.  GI PCR is negative.  LFTs normal.  Abdominal exam is benign.  Lipase only slightly elevated, not consistent with pancreatitis. -Patient still has intermittent vomiting with diarrhea but diarrhea is improving.  No need for repeat imaging study at this time.  Repeat a.m. LFTs. -Use Imodium as needed.  Non anion gap metabolic acidosis -Likely related to diarrhea.  Bicarb is 12 today.  DC oral sodium bicarb.  Start IV bicarb drip.  Monitor.  Generalized weakness -Due to severe dehydration and probable viral illness. -Chest x-ray was negative for infiltrates.  Unlikely that patient has COVID-19. -Isolation has been discontinued. -PT recommends home health PT with supervision 24/7: If family cannot provide 24/7 supervision, needs SNF.  Hypokalemia -Improved  Hypomagnesemia -Replace.  Repeat a.m. labs  Chronic anemia--stable.     Thrombocytopenia -Questionable cause.  No signs of bleeding.  Monitor  Hypertension--continue metoprolol.  Blood pressure stable  Peripheral vascular disease-continue Plavix  Hypothyroidism -Continue Synthroid  GERD -Continue PPI  Depression--continue Zoloft    DVT prophylaxis: Subcutaneous heparin Code Status: DNR Family Communication: None at bedside Disposition Plan: Probable discharge in 1 to 2 days if clinically improves and renal function improves Consultants: None  Procedures: None  Antimicrobials: None   Subjective: Patient seen and examined at bedside.  She is a poor historian.  Patient apparently had vomiting earlier this morning and is having intermittent diarrhea.  No overnight fever.  No worsening abdominal pain reported. Objective: Vitals:   11/29/18 2106 11/30/18 0452 11/30/18 0622 11/30/18 0623  BP: (!) 144/55  (!) 189/75 (!) 159/63  Pulse: 70  80   Resp:      Temp: 98 F (36.7 C)  97.8 F (36.6 C)   TempSrc: Oral  Oral   SpO2: 100%  96%   Weight:  51.3 kg    Height:        Intake/Output Summary (Last 24 hours) at 11/30/2018 1010 Last data filed at 11/30/2018 0615 Gross per 24 hour  Intake 806.25 ml  Output 800 ml  Net 6.25 ml   Filed Weights   11/28/18 1923 11/28/18 2347 11/30/18 0452  Weight: 49 kg 49.1 kg 51.3 kg    Examination:  General exam: Elderly female.  Poor historian.  Awake and answers some questions.  No acute distress.  Looks chronically ill Respiratory system: Bilateral decreased breath sounds at bases, scattered crackles Cardiovascular system: S1 & S2 heard, Rate controlled Gastrointestinal system: Abdomen is nondistended, soft and nontender. Normal  bowel sounds heard. Extremities: No cyanosis, edema    Data Reviewed: I have personally reviewed following labs and imaging studies  CBC: Recent Labs  Lab 11/28/18 1928 11/28/18 2145 11/29/18 0254 11/30/18 0329  WBC 7.9 7.8 6.8 6.8  NEUTROABS  --  5.6  --  5.1   HGB 10.8* 10.8* 9.4* 10.8*  HCT 35.5* 34.4* 30.5* 33.0*  MCV 95.2 97.2 93.0 93.5  PLT 142* 142* 117* 621*   Basic Metabolic Panel: Recent Labs  Lab 11/28/18 1928 11/29/18 0254 11/30/18 0329  NA 135 136 139  K 3.5 3.3* 3.9  CL 104 108 115*  CO2 17* 16* 12*  GLUCOSE 103* 87 79  BUN 86* 87* 72*  CREATININE 3.25* 3.04* 1.87*  CALCIUM 10.1 9.3 8.8*  MG  --   --  1.5*   GFR: Estimated Creatinine Clearance: 12.3 mL/min (A) (by C-G formula based on SCr of 1.87 mg/dL (H)). Liver Function Tests: Recent Labs  Lab 11/28/18 2003  AST 19  ALT 9  ALKPHOS 76  BILITOT 0.7  PROT 6.8  ALBUMIN 3.6   Recent Labs  Lab 11/28/18 2145  LIPASE 80*   No results for input(s): AMMONIA in the last 168 hours. Coagulation Profile: No results for input(s): INR, PROTIME in the last 168 hours. Cardiac Enzymes: No results for input(s): CKTOTAL, CKMB, CKMBINDEX, TROPONINI in the last 168 hours. BNP (last 3 results) No results for input(s): PROBNP in the last 8760 hours. HbA1C: No results for input(s): HGBA1C in the last 72 hours. CBG: No results for input(s): GLUCAP in the last 168 hours. Lipid Profile: No results for input(s): CHOL, HDL, LDLCALC, TRIG, CHOLHDL, LDLDIRECT in the last 72 hours. Thyroid Function Tests: No results for input(s): TSH, T4TOTAL, FREET4, T3FREE, THYROIDAB in the last 72 hours. Anemia Panel: No results for input(s): VITAMINB12, FOLATE, FERRITIN, TIBC, IRON, RETICCTPCT in the last 72 hours. Sepsis Labs: Recent Labs  Lab 11/28/18 1928  LATICACIDVEN 0.9    Recent Results (from the past 240 hour(s))  Gastrointestinal Panel by PCR , Stool     Status: None   Collection Time: 11/28/18  9:48 PM  Result Value Ref Range Status   Campylobacter species NOT DETECTED NOT DETECTED Final   Plesimonas shigelloides NOT DETECTED NOT DETECTED Final   Salmonella species NOT DETECTED NOT DETECTED Final   Yersinia enterocolitica NOT DETECTED NOT DETECTED Final   Vibrio species  NOT DETECTED NOT DETECTED Final   Vibrio cholerae NOT DETECTED NOT DETECTED Final   Enteroaggregative E coli (EAEC) NOT DETECTED NOT DETECTED Final   Enteropathogenic E coli (EPEC) NOT DETECTED NOT DETECTED Final   Enterotoxigenic E coli (ETEC) NOT DETECTED NOT DETECTED Final   Shiga like toxin producing E coli (STEC) NOT DETECTED NOT DETECTED Final   Shigella/Enteroinvasive E coli (EIEC) NOT DETECTED NOT DETECTED Final   Cryptosporidium NOT DETECTED NOT DETECTED Final   Cyclospora cayetanensis NOT DETECTED NOT DETECTED Final   Entamoeba histolytica NOT DETECTED NOT DETECTED Final   Giardia lamblia NOT DETECTED NOT DETECTED Final   Adenovirus F40/41 NOT DETECTED NOT DETECTED Final   Astrovirus NOT DETECTED NOT DETECTED Final   Norovirus GI/GII NOT DETECTED NOT DETECTED Final   Rotavirus A NOT DETECTED NOT DETECTED Final   Sapovirus (I, II, IV, and V) NOT DETECTED NOT DETECTED Final    Comment: Performed at [facility], [address], [place], [place] [address]         Radiology Studies: US Renal  Result Date: 11/28/2018  CLINICAL DATA:  Initial evaluation for acute renal injury. EXAM: RENAL / URINARY TRACT ULTRASOUND COMPLETE COMPARISON:  None available. FINDINGS: Right Kidney: Renal measurements: 7.7 x 3.6 x 4.1 cm = volume: 60.9 mL . Echogenicity within normal limits. Mild diffuse cortical thinning. No mass or hydronephrosis visualized. Left Kidney: Renal measurements: 8.6 x 4.1 x 3.8 cm = volume: 70.6 mL. Echogenicity within normal limits. Mild diffuse cortical thinning. No mass or hydronephrosis visualized. Bladder: Appears normal for degree of bladder distention. Irregular cluster of cysts measuring up to 4.4 cm noted within the spleen. No internal vascularity or solid component. Partially visualized gallbladder appears somewhat distended. IMPRESSION: 1. Mild diffuse chronic cortical thinning of both kidneys. No hydronephrosis. 2. Incidental 4.4 cm irregular complex cyst  versus cluster of cysts within the spleen. 3. Distended gallbladder. Electronically Signed   By: Jeannine Boga M.D.   On: 11/28/2018 22:35   Dg Chest Port 1 View  Result Date: 11/28/2018 CLINICAL DATA:  Rales. Vomiting and diarrhea. Weakness. EXAM: PORTABLE CHEST 1 VIEW COMPARISON:  08/30/2017 FINDINGS: Lung volumes are low. Mild left lung base atelectasis. Borderline cardiomegaly, unchanged from prior. Tortuous atherosclerotic thoracic aorta. Pulmonary vasculature is normal. No consolidation, pleural effusion, or pneumothorax. No acute osseous abnormalities are seen. Incidental note of gaseous gastric distention in the upper abdomen. IMPRESSION: Low lung volumes with mild left lung base atelectasis. Electronically Signed   By: Keith Rake M.D.   On: 11/28/2018 23:03        Scheduled Meds:  brimonidine  1 drop Left Eye TID   clopidogrel  75 mg Oral QODAY   dorzolamide-timolol  1 drop Left Eye BID   feeding supplement (ENSURE ENLIVE)  237 mL Oral BID BM   ferrous sulfate  325 mg Oral QPM   gemfibrozil  600 mg Oral BID AC   heparin  5,000 Units Subcutaneous Q8H   latanoprost  1 drop Both Eyes QHS   levothyroxine  25 mcg Oral QODAY   levothyroxine  50 mcg Oral QODAY   metoprolol tartrate  50 mg Oral BID   pantoprazole  40 mg Oral Daily   sertraline  100 mg Oral QHS   Continuous Infusions:   sodium bicarbonate  infusion 1000 mL 100 mL/hr at 11/30/18 0912     LOS: 1 day        Aline August, MD [facility] 11/30/2018, 10:10 AM

## 2018-11-30 NOTE — Progress Notes (Signed)
Approximately [phone] arrived to patient's room to admin scheduled 0600 heparin and found patient with emesis around mouth, neck and gown. Emesis was brown/green hue and applesauce consistency (applesauce was last food provided to patient). Patient also stated she had soiled herself; observed brown diarrhea. Patient indicated she felt poorly and was unable to move due to diarrhea and emesis. Patient reported no GI  Symptoms, no tenderness with palpation and no abdominal pain. Indicated just felt week. Upon further inquiry patient stated similar episodes of emesis and diarrhea occurred at home happened at home prior to admission.  RN and NT performed patient care. Informed day RN of episode of emesis and diarrhea and to inform attending MD during day shift.

## 2018-12-01 ENCOUNTER — Inpatient Hospital Stay (HOSPITAL_COMMUNITY): Payer: Medicare Other

## 2018-12-01 DIAGNOSIS — N390 Urinary tract infection, site not specified: Secondary | ICD-10-CM

## 2018-12-01 LAB — CBC WITH DIFFERENTIAL/PLATELET
Abs Immature Granulocytes: 0.02 10*3/uL (ref 0.00–0.07)
Basophils Absolute: 0 10*3/uL (ref 0.0–0.1)
Basophils Relative: 0 %
Eosinophils Absolute: 0.1 10*3/uL (ref 0.0–0.5)
Eosinophils Relative: 2 %
HCT: 29.8 % — ABNORMAL LOW (ref 36.0–46.0)
Hemoglobin: 9.6 g/dL — ABNORMAL LOW (ref 12.0–15.0)
Immature Granulocytes: 0 %
Lymphocytes Relative: 19 %
Lymphs Abs: 0.9 10*3/uL (ref 0.7–4.0)
MCH: 28.7 pg (ref 26.0–34.0)
MCHC: 32.2 g/dL (ref 30.0–36.0)
MCV: 89.2 fL (ref 80.0–100.0)
Monocytes Absolute: 0.6 10*3/uL (ref 0.1–1.0)
Monocytes Relative: 12 %
Neutro Abs: 3.1 10*3/uL (ref 1.7–7.7)
Neutrophils Relative %: 67 %
Platelets: 122 10*3/uL — ABNORMAL LOW (ref 150–400)
RBC: 3.34 MIL/uL — ABNORMAL LOW (ref 3.87–5.11)
RDW: 16.5 % — ABNORMAL HIGH (ref 11.5–15.5)
WBC: 4.7 10*3/uL (ref 4.0–10.5)
nRBC: 0 % (ref 0.0–0.2)

## 2018-12-01 LAB — BASIC METABOLIC PANEL
Anion gap: 9 (ref 5–15)
BUN: 41 mg/dL — ABNORMAL HIGH (ref 8–23)
CO2: 19 mmol/L — ABNORMAL LOW (ref 22–32)
Calcium: 8.3 mg/dL — ABNORMAL LOW (ref 8.9–10.3)
Chloride: 112 mmol/L — ABNORMAL HIGH (ref 98–111)
Creatinine, Ser: 1.21 mg/dL — ABNORMAL HIGH (ref 0.44–1.00)
GFR calc Af Amer: 44 mL/min — ABNORMAL LOW (ref >60)
GFR calc non Af Amer: 38 mL/min — ABNORMAL LOW (ref >60)
Glucose, Bld: 127 mg/dL — ABNORMAL HIGH (ref 70–99)
Potassium: 3.1 mmol/L — ABNORMAL LOW (ref 3.5–5.1)
Sodium: 140 mmol/L (ref 135–145)

## 2018-12-01 LAB — MAGNESIUM: Magnesium: 1.9 mg/dL (ref 1.7–2.4)

## 2018-12-01 MED ORDER — SODIUM CHLORIDE 0.9 % IV SOLN
1.0000 g | INTRAVENOUS | Status: DC
  Administered 2018-12-01 – 2018-12-05 (×5): 1 g via INTRAVENOUS
  Filled 2018-12-01 (×5): qty 10

## 2018-12-01 MED ORDER — PANTOPRAZOLE SODIUM 40 MG PO TBEC
40.0000 mg | DELAYED_RELEASE_TABLET | Freq: Two times a day (BID) | ORAL | Status: DC
  Administered 2018-12-01 – 2018-12-11 (×20): 40 mg via ORAL
  Filled 2018-12-01 (×20): qty 1

## 2018-12-01 NOTE — Plan of Care (Signed)
Poor intake. C/O "slow swallowing". SLP eval ordered.

## 2018-12-01 NOTE — Progress Notes (Signed)
Patient ID: [name], female   DOB: 05-13-1921, 85 y.o.   MRN: [id number]  PROGRESS NOTE    [name]  NWG:[id number] DOB: [birth year] DOA: 11/28/2018 PCP: Wenda Low, MD   Brief Narrative:  85 year old female with history of anemia, hypertension, hypothyroidism, GERD, hyperlipidemia, paroxysmal SVT presented on 11/28/2018 with generalized weakness, diarrhea and vomiting.  She was found to have acute kidney injury with creatinine of 3.25.  She was started on IV fluids.  Assessment & Plan:   Principal Problem:   AKI (acute kidney injury) ([place]) Active Problems:   Viral gastroenteritis   Generalized weakness   Chronic anemia   Thrombocytopenia (HCC)  Acute kidney injury -Likely prerenal due to severe dehydration from GI loss and concomitant ACE inhibitor use -Presented with creatinine of 3.25, baseline 1.1 two years ago. -Renal ultrasound was negative for hydronephrosis.  ACE inhibitor on hold. -UA pending. -Creatinine is pending this morning.  Currently on bicarb drip.  Monitor creatinine today.  Probable UTI: Probably present on admission -Urinalysis suggestive of UTI.  Will start Rocephin empirically as patient is not feeling well and has very poor appetite.  Follow urine cultures.  Gastroenteritis -Most likely viral.  GI PCR is negative.  LFTs normal.  Abdominal exam is benign.  Lipase only slightly elevated, not consistent with pancreatitis. -Patient still has intermittent diarrhea but diarrhea is improving.  -Patient still not feeling well.  Will proceed with CT of the abdomen/pelvis with only oral contrast. -Use Imodium as needed.  Non anion gap metabolic acidosis -Likely related to diarrhea.  Continue IV bicarb drip.  Follow bicarb level for today.  Generalized weakness -Due to severe dehydration and probable viral illness. -Chest x-ray was negative for infiltrates.  Unlikely that patient has COVID-19. -Isolation has been discontinued. -PT recommends  home health PT with supervision 24/7: If family cannot provide 24/7 supervision, needs SNF.  Hypokalemia -Monitor potassium  Hypomagnesemia -Monitor magnesium  Chronic anemia--stable.    Thrombocytopenia -Questionable cause.  No signs of bleeding.  Monitor  Hypertension--continue metoprolol.  Blood pressure stable  Peripheral vascular disease-continue Plavix  Hypothyroidism -Continue Synthroid  GERD -Continue PPI  Depression--continue Zoloft    DVT prophylaxis: Subcutaneous heparin Code Status: DNR Family Communication: Spoke to daughter/[name] Easter on phone on 11/30/2018. Disposition Plan: Probable discharge in 1 to 2 days if clinically improves and renal function improves Consultants: None  Procedures: None  Antimicrobials: None   Subjective: Patient seen and examined at bedside.  She is a poor historian.  She does not feel well.  Her appetite is very poor.  No overnight fever.  Still having some diarrhea yesterday and was given 1 dose of Imodium.   Objective: Vitals:   11/30/18 1342 11/30/18 2022 11/30/18 2104 12/01/18 0526  BP: (!) 178/57 (!) 174/64  (!) 157/62  Pulse: 65 71 74 67  Resp: 16 16  16   Temp: 97.9 F (36.6 C) 98.9 F (37.2 C)  97.7 F (36.5 C)  TempSrc:    Oral  SpO2: 100% 96%  96%  Weight:      Height:        Intake/Output Summary (Last 24 hours) at 12/01/2018 0913 Last data filed at 12/01/2018 0019 Gross per 24 hour  Intake 1404.94 ml  Output -  Net 1404.94 ml   Filed Weights   11/28/18 1923 11/28/18 2347 11/30/18 0452  Weight: 49 kg 49.1 kg 51.3 kg    Examination:  General exam: Elderly female.  Poor historian.  Awake and answers  some questions.  No distress.  Looks chronically ill. Respiratory system: Bilateral decreased breath sounds at bases, scattered crackles.  No wheezing Cardiovascular system: S1 & S2 heard, Rate controlled Gastrointestinal system: Abdomen is nondistended, soft and mildly tender in the  epigastric/periumbilical region.  Normal bowel sounds heard. Extremities: No cyanosis, edema    Data Reviewed: I have personally reviewed following labs and imaging studies  CBC: Recent Labs  Lab 11/28/18 1928 11/28/18 2145 11/29/18 0254 11/30/18 0329 12/01/18 0825  WBC 7.9 7.8 6.8 6.8 4.7  NEUTROABS  --  5.6  --  5.1 3.1  HGB 10.8* 10.8* 9.4* 10.8* 9.6*  HCT 35.5* 34.4* 30.5* 33.0* 29.8*  MCV 95.2 97.2 93.0 93.5 89.2  PLT 142* 142* 117* 132* 673*   Basic Metabolic Panel: Recent Labs  Lab 11/28/18 1928 11/29/18 0254 11/30/18 0329  NA 135 136 139  K 3.5 3.3* 3.9  CL 104 108 115*  CO2 17* 16* 12*  GLUCOSE 103* 87 79  BUN 86* 87* 72*  CREATININE 3.25* 3.04* 1.87*  CALCIUM 10.1 9.3 8.8*  MG  --   --  1.5*   GFR: Estimated Creatinine Clearance: 12.3 mL/min (A) (by C-G formula based on SCr of 1.87 mg/dL (H)). Liver Function Tests: Recent Labs  Lab 11/28/18 2003  AST 19  ALT 9  ALKPHOS 76  BILITOT 0.7  PROT 6.8  ALBUMIN 3.6   Recent Labs  Lab 11/28/18 2145  LIPASE 80*   No results for input(s): AMMONIA in the last 168 hours. Coagulation Profile: No results for input(s): INR, PROTIME in the last 168 hours. Cardiac Enzymes: No results for input(s): CKTOTAL, CKMB, CKMBINDEX, TROPONINI in the last 168 hours. BNP (last 3 results) No results for input(s): PROBNP in the last 8760 hours. HbA1C: No results for input(s): HGBA1C in the last 72 hours. CBG: No results for input(s): GLUCAP in the last 168 hours. Lipid Profile: No results for input(s): CHOL, HDL, LDLCALC, TRIG, CHOLHDL, LDLDIRECT in the last 72 hours. Thyroid Function Tests: No results for input(s): TSH, T4TOTAL, FREET4, T3FREE, THYROIDAB in the last 72 hours. Anemia Panel: No results for input(s): VITAMINB12, FOLATE, FERRITIN, TIBC, IRON, RETICCTPCT in the last 72 hours. Sepsis Labs: Recent Labs  Lab 11/28/18 1928  LATICACIDVEN 0.9    Recent Results (from the past 240 hour(s))   Gastrointestinal Panel by PCR , Stool     Status: None   Collection Time: 11/28/18  9:48 PM  Result Value Ref Range Status   Campylobacter species NOT DETECTED NOT DETECTED Final   Plesimonas shigelloides NOT DETECTED NOT DETECTED Final   Salmonella species NOT DETECTED NOT DETECTED Final   Yersinia enterocolitica NOT DETECTED NOT DETECTED Final   Vibrio species NOT DETECTED NOT DETECTED Final   Vibrio cholerae NOT DETECTED NOT DETECTED Final   Enteroaggregative E coli (EAEC) NOT DETECTED NOT DETECTED Final   Enteropathogenic E coli (EPEC) NOT DETECTED NOT DETECTED Final   Enterotoxigenic E coli (ETEC) NOT DETECTED NOT DETECTED Final   Shiga like toxin producing E coli (STEC) NOT DETECTED NOT DETECTED Final   Shigella/Enteroinvasive E coli (EIEC) NOT DETECTED NOT DETECTED Final   Cryptosporidium NOT DETECTED NOT DETECTED Final   Cyclospora cayetanensis NOT DETECTED NOT DETECTED Final   Entamoeba histolytica NOT DETECTED NOT DETECTED Final   Giardia lamblia NOT DETECTED NOT DETECTED Final   Adenovirus F40/41 NOT DETECTED NOT DETECTED Final   Astrovirus NOT DETECTED NOT DETECTED Final   Norovirus GI/GII NOT DETECTED NOT DETECTED Final  Rotavirus A NOT DETECTED NOT DETECTED Final   Sapovirus (I, II, IV, and V) NOT DETECTED NOT DETECTED Final    Comment: Performed at [facility], [address], [place], [place] [address]         Radiology Studies: No results found.      Scheduled Meds: . brimonidine  1 drop Left Eye TID  . clopidogrel  75 mg Oral QODAY  . dorzolamide-timolol  1 drop Left Eye BID  . feeding supplement (ENSURE ENLIVE)  237 mL Oral BID BM  . ferrous sulfate  325 mg Oral QPM  . gemfibrozil  600 mg Oral BID AC  . heparin  5,000 Units Subcutaneous Q8H  . latanoprost  1 drop Both Eyes QHS  . levothyroxine  25 mcg Oral QODAY  . levothyroxine  50 mcg Oral QODAY  . metoprolol tartrate  50 mg Oral BID  . pantoprazole  40 mg Oral Daily  . sertraline   100 mg Oral QHS   Continuous Infusions: . cefTRIAXone (ROCEPHIN)  IV    .  sodium bicarbonate  infusion 1000 mL 100 mL/hr at 12/01/18 0903     LOS: 2 days        Aline August, MD [facility] 12/01/2018, 9:13 AM

## 2018-12-01 NOTE — Progress Notes (Signed)
SLP Cancellation Note  Patient Details Name: [name] MRN: [id number] DOB: August 04, 1921   Cancelled treatment:       Reason Eval/Treat Not Completed: Patient at procedure or test/unavailable. Pt off the floor for CT. Will follow up next date.  Deneise Lever, [place], CCC-SLP Speech-Language Pathologist Acute Rehabilitation Services Pager: [phone] Office: [phone]    Aliene Altes 12/01/2018, 3:48 PM

## 2018-12-02 DIAGNOSIS — K529 Noninfective gastroenteritis and colitis, unspecified: Secondary | ICD-10-CM

## 2018-12-02 LAB — CBC WITH DIFFERENTIAL/PLATELET
Abs Immature Granulocytes: 0.01 10*3/uL (ref 0.00–0.07)
Basophils Absolute: 0 10*3/uL (ref 0.0–0.1)
Basophils Relative: 0 %
Eosinophils Absolute: 0.1 10*3/uL (ref 0.0–0.5)
Eosinophils Relative: 4 %
HCT: 25.9 % — ABNORMAL LOW (ref 36.0–46.0)
Hemoglobin: 8.8 g/dL — ABNORMAL LOW (ref 12.0–15.0)
Immature Granulocytes: 0 %
Lymphocytes Relative: 31 %
Lymphs Abs: 1 10*3/uL (ref 0.7–4.0)
MCH: 29.8 pg (ref 26.0–34.0)
MCHC: 34 g/dL (ref 30.0–36.0)
MCV: 87.8 fL (ref 80.0–100.0)
Monocytes Absolute: 0.5 10*3/uL (ref 0.1–1.0)
Monocytes Relative: 15 %
Neutro Abs: 1.6 10*3/uL — ABNORMAL LOW (ref 1.7–7.7)
Neutrophils Relative %: 50 %
Platelets: 105 10*3/uL — ABNORMAL LOW (ref 150–400)
RBC: 2.95 MIL/uL — ABNORMAL LOW (ref 3.87–5.11)
RDW: 16.7 % — ABNORMAL HIGH (ref 11.5–15.5)
WBC: 3.3 10*3/uL — ABNORMAL LOW (ref 4.0–10.5)
nRBC: 0 % (ref 0.0–0.2)

## 2018-12-02 LAB — COMPREHENSIVE METABOLIC PANEL
ALT: 8 U/L (ref 0–44)
AST: 16 U/L (ref 15–41)
Albumin: 2.4 g/dL — ABNORMAL LOW (ref 3.5–5.0)
Alkaline Phosphatase: 61 U/L (ref 38–126)
Anion gap: 10 (ref 5–15)
BUN: 28 mg/dL — ABNORMAL HIGH (ref 8–23)
CO2: 24 mmol/L (ref 22–32)
Calcium: 8.1 mg/dL — ABNORMAL LOW (ref 8.9–10.3)
Chloride: 108 mmol/L (ref 98–111)
Creatinine, Ser: 1.13 mg/dL — ABNORMAL HIGH (ref 0.44–1.00)
GFR calc Af Amer: 48 mL/min — ABNORMAL LOW (ref >60)
GFR calc non Af Amer: 41 mL/min — ABNORMAL LOW (ref >60)
Glucose, Bld: 114 mg/dL — ABNORMAL HIGH (ref 70–99)
Potassium: 2.6 mmol/L — CL (ref 3.5–5.1)
Sodium: 142 mmol/L (ref 135–145)
Total Bilirubin: 0.6 mg/dL (ref 0.3–1.2)
Total Protein: 4.9 g/dL — ABNORMAL LOW (ref 6.5–8.1)

## 2018-12-02 LAB — MAGNESIUM: Magnesium: 1.7 mg/dL (ref 1.7–2.4)

## 2018-12-02 MED ORDER — POTASSIUM CHLORIDE CRYS ER 20 MEQ PO TBCR
40.0000 meq | EXTENDED_RELEASE_TABLET | ORAL | Status: AC
  Administered 2018-12-02 (×2): 40 meq via ORAL
  Filled 2018-12-02 (×2): qty 2

## 2018-12-02 MED ORDER — METRONIDAZOLE IN NACL 5-0.79 MG/ML-% IV SOLN
500.0000 mg | Freq: Three times a day (TID) | INTRAVENOUS | Status: DC
  Administered 2018-12-02 – 2018-12-03 (×4): 500 mg via INTRAVENOUS
  Filled 2018-12-02 (×4): qty 100

## 2018-12-02 NOTE — TOC Initial Note (Addendum)
Transition of Care [facility]) - Initial/Assessment Note    Patient Details  Name: [name] MRN: [id number] Date of Birth: 1921-04-16  Transition of Care [facility]) CM/SW Contact:    Sharin Mons, RN Phone Number: 12/02/2018, 11:35 AM  Clinical Narrative:         Admitted with AKI.From home alone. Daughter [name] lives next door and states pt receives help from her caregiver qd, 9a-3p. PTA independent with ADL's, DME usage: tub bench, W/C. Recommendations from PT: Home health PT;Supervision/Assistance - 24 hour;Other (comment)(if pt's family cannot provide 24/7 supervision, needs SNF).  NCM with spoke pt/daughter and preference is to return to home vs SNF placement. NCM shared [facility] 1[facility] program. Daughter expressed interest... referral made for Home 1st program.   [name] (Daughter) Hope Pigeon (Daughter)     [phone]      NCM will continued monitor for TOC needs (SNF vs home with home health services).  Expected Discharge Plan: [place] Barriers to Discharge: Continued Medical Work up   Patient Goals and CMS Choice Patient states their goals for this hospitalization and ongoing recovery are:: I want to go home CMS Medicare.gov Compare Post Acute Care list provided to:: Patient Choice offered to / list presented to : Patient, Adult Children  Expected Discharge Plan and Services Expected Discharge Plan: [place] In-house Referral: [facility] Discharge Planning Services: CM Consult   Living arrangements for the past 2 months: Single Family Home                     HH Arranged: RN, PT [place] Agency: [place]  Prior Living Arrangements/Services Living arrangements for the past 2 months: Single Family Home Lives with:: Self(daughter [name] lives next door) Patient language and need for interpreter reviewed:: Yes Do you feel safe going back to the place where you live?: Yes      Need for Family  Participation in Patient Care: Yes (Comment)(Daughter [name] states pt has cargiver qd from 9a-3pm.) Care giver support system in place?: Yes (comment)   Criminal Activity/Legal Involvement Pertinent to Current Situation/Hospitalization: No - Comment as needed  Activities of Daily Living Home Assistive Devices/Equipment: Walker (specify type) ADL Screening (condition at time of admission) Patient's cognitive ability adequate to safely complete daily activities?: Yes Is the patient deaf or have difficulty hearing?: No Does the patient have difficulty seeing, even when wearing glasses/contacts?: No Does the patient have difficulty concentrating, remembering, or making decisions?: No Patient able to express need for assistance with ADLs?: Yes Does the patient have difficulty dressing or bathing?: Yes Independently performs ADLs?: No Communication: Independent Dressing (OT): Needs assistance Is this a change from baseline?: Pre-admission baseline Grooming: Needs assistance Is this a change from baseline?: Pre-admission baseline Feeding: Needs assistance Is this a change from baseline?: Pre-admission baseline Bathing: Needs assistance Is this a change from baseline?: Pre-admission baseline Toileting: Needs assistance Is this a change from baseline?: Pre-admission baseline In/Out Bed: Needs assistance Is this a change from baseline?: Pre-admission baseline Walks in Home: Needs assistance Is this a change from baseline?: Pre-admission baseline Does the patient have difficulty walking or climbing stairs?: Yes Weakness of Legs: Both Weakness of Arms/Hands: Both  Permission Sought/Granted Permission sought to share information with : Case Manager, Family Supports Permission granted to share information with : Yes, Verbal Permission Granted  Share Information with NAME: [name] (Daughter)[name] Hemphill (Daughter)  Emotional Assessment Appearance:: Appears stated  age Attitude/Demeanor/Rapport: Engaged Affect (typically observed): Accepting Orientation: : Oriented to Self, Oriented to Place, Oriented to Situation Alcohol / Substance Use: Not Applicable Psych Involvement: No (comment)  Admission diagnosis:  [place] [R09.89] AKI (acute kidney injury) ([place]) [N17.9] Patient Active Problem List   Diagnosis Date Noted  . AKI (acute kidney injury) ([place]) 11/28/2018  . Viral gastroenteritis 11/28/2018  . Generalized weakness 11/28/2018  . Chronic anemia 11/28/2018  . Thrombocytopenia ([place]) 11/28/2018  . Chronic bilateral low back pain 10/31/2016  . Closed subcapital fracture of femur, left, initial encounter ([place]) 10/16/2016  . Peripheral vascular disease ([place]) 10/16/2016  . Essential hypertension 10/16/2016  . Hypothyroidism, acquired 10/16/2016  . Dry eye 04/21/2015  . H/O surgical procedure 05/25/2014  . 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 Pseudoaphakia 04/10/2014  . Acute appendicitis 08/22/2013   PCP:  Wenda Low, MD Pharmacy:   [place], [address] [place] [address] Phone: [phone] Fax: [phone]     Social Determinants of Health (SDOH) Interventions    Readmission Risk Interventions No flowsheet data found.

## 2018-12-02 NOTE — TOC Progression Note (Signed)
Transition of Care [facility]) - Progression Note    Patient Details  Name: ABIGIAL [place] MRN: [id number] Date of Birth: 09-07-1920  Transition of Care [facility]) CM/SW [place], LCSW Phone Number: 12/02/2018, 1:41 PM  Clinical Narrative:    Alvis Lemmings has accepted patient into the [facility] program and will arrange home care upon discharge.    Expected Discharge Plan: [place] Barriers to Discharge: Continued Medical Work up  Expected Discharge Plan and Services Expected Discharge Plan: [place] In-house Referral: [facility] Discharge Planning Services: CM Consult   Living arrangements for the past 2 months: Single Family Home                     HH Arranged: RN, PT [place] Agency: [place]   Social Determinants of Health (SDOH) Interventions    Readmission Risk Interventions No flowsheet data found.

## 2018-12-02 NOTE — Consult Note (Signed)
   [facility] CM Inpatient Consult   12/02/2018  MELAT WRISLEY July 05, 1921 [id number]   We have reviewed your referral request and assigned this patient meets the requirements for [facility] Care Management programs with the patients primary Medicare insurance and primary care provider.  Chart review reveals that the patient is active with [employer] [facility] with their home palliative program prior to admission.   For additional questions, please contact:  Natividad Brood, RN BSN [facility] Liaison  [phone] business mobile phone Toll free office [phone]

## 2018-12-02 NOTE — Progress Notes (Signed)
Physical Therapy Treatment Patient Details Name: [name] MRN: [id number] DOB: Feb 01, 1921 Today's Date: 12/02/2018    History of Present Illness Pt is a 85 y/o female admitted secondary to nausea, vomitting, diarrhea and weakness. Found to be dehydrated and with an AKI and to have Gastroenteritis. PMH including but not limited to hypertension, hypothyroidism, GERD, hyperlipidemia, paroxysmal SVT.     PT Comments    Pt with slow progression towards goals. Pt requiring mod to max A to transfer to chair this session. Pt with heavy L lateral lean and had difficulty taking steps towards chair. Was unable to safely use RW during transfer. Given increased need for assist, feel pt would benefit from SNF level therapies. However, if pt and family refuses will need max HH services at d/c. Pt will also likely require ambulance transport home as she has 2 steps to enter home and do not feel she can safely perform at this point. Will continue to follow acutely to maximize functional mobility independence and safety.    Follow Up Recommendations  SNF;Supervision/Assistance - 24 hour(vs home first if pt/family refuses)     Equipment Recommendations  None recommended by PT    Recommendations for Other Services       Precautions / Restrictions Precautions Precautions: Fall Restrictions Weight Bearing Restrictions: No    Mobility  Bed Mobility Overal bed mobility: Needs Assistance Bed Mobility: Supine to Sit     Supine to sit: Mod assist     General bed mobility comments: Mod A for LE assist and trunk elevation. Required assist to scoot hips to EOB as well.   Transfers Overall transfer level: Needs assistance Equipment used: 1 person hand held assist;Rolling walker (2 wheeled) Transfers: Sit to/from [facility] Sit to Stand: Mod assist Stand pivot transfers: Max assist       General transfer comment: Attempted to stand with RW, however, pt with heavy L lateral  lean. Stood in front of pt and had her hold onto PT arms. Required mod A for lift assist and steadying and required max A for steadying to take steps to chair. Further mobility deferred.   Ambulation/Gait                 Stairs             Wheelchair Mobility    Modified Rankin (Stroke Patients Only)       Balance Overall balance assessment: Needs assistance Sitting-balance support: Feet supported Sitting balance-Leahy Scale: Fair   Postural control: Left lateral lean Standing balance support: Bilateral upper extremity supported;During functional activity Standing balance-Leahy Scale: Poor Standing balance comment: Pt with L lateral lean and requiring external support for steanding balance.                             Cognition Arousal/Alertness: Awake/alert Behavior During Therapy: WFL for tasks assessed/performed Overall Cognitive Status: No family/caregiver present to determine baseline cognitive functioning                                        Exercises      General Comments        Pertinent Vitals/Pain Pain Assessment: No/denies pain    Home Living                      Prior Function  PT Goals (current goals can now be found in the care plan section) Acute Rehab PT Goals Patient Stated Goal: "go back home" PT Goal Formulation: With patient Time For Goal Achievement: 12/13/18 Potential to Achieve Goals: Good Progress towards PT goals: Progressing toward goals    Frequency    Min 3X/week      PT Plan Discharge plan needs to be updated    Co-evaluation              AM-PAC PT "6 Clicks" Mobility   Outcome Measure  Help needed turning from your back to your side while in a flat bed without using bedrails?: A Little Help needed moving from lying on your back to sitting on the side of a flat bed without using bedrails?: A Lot Help needed moving to and from a bed to a chair  (including a wheelchair)?: Total Help needed standing up from a chair using your arms (e.g., wheelchair or bedside chair)?: A Lot Help needed to walk in hospital room?: Total Help needed climbing 3-5 steps with a railing? : Total 6 Click Score: 10    End of Session Equipment Utilized During Treatment: Gait belt Activity Tolerance: Patient limited by fatigue Patient left: in chair;with call bell/phone within reach;with chair alarm set Nurse Communication: Mobility status PT Visit Diagnosis: Other abnormalities of gait and mobility (R26.89);Muscle weakness (generalized) (M62.81)     Time: 5366-4403 PT Time Calculation (min) (ACUTE ONLY): 20 min  Charges:  $Therapeutic Activity: 8-22 mins                     Leighton Ruff, PT, DPT  Acute Rehabilitation Services  Pager: [phone] Office: [phone]    Rudean Hitt 12/02/2018, 2:28 PM

## 2018-12-02 NOTE — Plan of Care (Signed)
  Problem: Clinical Measurements: Goal: Ability to maintain clinical measurements within normal limits will improve Outcome: Progressing   Problem: Health Behavior/Discharge Planning: Goal: Ability to manage health-related needs will improve Outcome: Progressing   Problem: Clinical Measurements: Goal: Will remain free from infection Outcome: Progressing   Problem: Nutrition: Goal: Adequate nutrition will be maintained Outcome: Progressing   Problem: Coping: Goal: Level of anxiety will decrease Outcome: Progressing   Problem: Elimination: Goal: Will not experience complications related to bowel motility Outcome: Progressing   Problem: Pain Managment: Goal: General experience of comfort will improve Outcome: Progressing   Problem: Safety: Goal: Ability to remain free from injury will improve Outcome: Progressing   Problem: Skin Integrity: Goal: Risk for impaired skin integrity will decrease Outcome: Progressing

## 2018-12-02 NOTE — Evaluation (Signed)
Clinical/Bedside Swallow Evaluation Patient Details  Name: [name] MRN: [id number] Date of Birth: 1921-01-28  Today's Date: 12/02/2018 Time: SLP Start Time (ACUTE ONLY): 1010 SLP Stop Time (ACUTE ONLY): 1045 SLP Time Calculation (min) (ACUTE ONLY): 35 min  Past Medical History:  Past Medical History:  Diagnosis Date  . Anemia   . Anginal pain ([place])   . Arthritis    "all over; doesn't seem to bother me much" (08/22/2013)  . Chronic lower back pain   . Dysrhythmia    "irregular heart beat"   . Exertional shortness of breath   . GERD (gastroesophageal reflux disease)   . Hypertension   . Hypothyroidism   . Migraines    "used to"  . Mixed hyperlipidemia   . Osteoporosis   . Paroxysmal supraventricular tachycardia ([place])   . Skin cancer    "couple taken off right leg" (08/22/2013)   Past Surgical History:  Past Surgical History:  Procedure Laterality Date  . ABDOMINAL HYSTERECTOMY    . ANTERIOR APPROACH HEMI HIP ARTHROPLASTY Left 10/18/2016   Procedure: ANTERIOR APPROACH LEFT HEMI HIP ARTHROPLASTY;  Surgeon: Leandrew Koyanagi, MD;  Location: [facility];  Service: Orthopedics;  Laterality: Left;  . APPENDECTOMY  08/22/2013  . BACK SURGERY    . CATARACT EXTRACTION W/ INTRAOCULAR LENS  IMPLANT, BILATERAL    . LAPAROSCOPIC APPENDECTOMY N/A 08/22/2013   Procedure: APPENDECTOMY LAPAROSCOPIC;  Surgeon: Imogene Burn. [name], MD;  Location: [facility];  Service: General;  Laterality: N/A;  . LUMBAR [place] SURGERY  2008  . THROAT SURGERY  1940's; ?   "cysts removed"    HPI:  Pt is a 85 y/o female admitted secondary to nausea, vomitting, diarrhea and weakness. Found to be dehydrated and with an AKI and to have Gastroenteritis. PMH including but not limited to hypertension, hypothyroidism, GERD, hyperlipidemia, paroxysmal SVT.    Assessment / Plan / Recommendation Clinical Impression  Patient presents with a mild oropharyngeal dysphagia with suspected esophageal component secondary to patient's h/o GERD  and patient complaining of globus sensation and feeling that solids get "stuck" (pointing to base of neck) as well as patient's admission with nausea and vomitting. Patient exhibited delayed mastication of regular solids, swallow initiation delays with thin liquids, puree solids and regular solids, but did not exhbit any overt s/s of aspiraiton or penetration. SLP to downgrade patient's diet from regular solids to dys 3 (mechanical soft) solids and will determine need for MBS at later date.  SLP Visit Diagnosis: Dysphagia, unspecified (R13.10)    Aspiration Risk       Diet Recommendation Dysphagia 3 (Mech soft);Thin liquid   Liquid Administration via: Cup;Straw Medication Administration: Whole meds with liquid Supervision: Patient able to self feed;Intermittent supervision to cue for compensatory strategies Compensations: Minimize environmental distractions;Slow rate;Small sips/bites Postural Changes: Seated upright at 90 degrees;Remain upright for at least 30 minutes after po intake    Other  Recommendations Recommended Consults: Consider GI evaluation;Consider esophageal assessment Oral Care Recommendations: Oral care BID   Follow up Recommendations Other (comment)(TBD pending progress)      Frequency and Duration min 1 x/week  1 week       Prognosis Prognosis for Safe Diet Advancement: Good      Swallow Study   General Date of Onset: 11/28/18 HPI: Pt is a 85 y/o female admitted secondary to nausea, vomitting, diarrhea and weakness. Found to be dehydrated and with an AKI and to have Gastroenteritis. PMH including but not limited to hypertension, hypothyroidism, GERD, hyperlipidemia,  paroxysmal SVT.  Type of Study: Bedside Swallow Evaluation Previous Swallow Assessment: N/A Diet Prior to this Study: Regular;Thin liquids Temperature Spikes Noted: No History of Recent Intubation: No Behavior/Cognition: Alert;Cooperative;Pleasant mood;Confused Oral Cavity Assessment: Within  Functional Limits Oral Care Completed by SLP: Other (Comment)(setup patient with toothbrush and paste and she completed oral care) Oral Cavity - Dentition: Adequate natural dentition Vision: Functional for self-feeding Self-Feeding Abilities: Able to feed self Patient Positioning: Upright in bed Baseline Vocal Quality: Normal Volitional Cough: Weak Volitional Swallow: Able to elicit    Oral/Motor/Sensory Function Overall Oral Motor/Sensory Function: Within functional limits   Ice Chips Ice chips: Not tested   Thin Liquid Thin Liquid: Impaired Presentation: Straw Pharyngeal  Phase Impairments: Suspected delayed Swallow    Nectar Thick Nectar Thick Liquid: Not tested   Honey Thick Honey Thick Liquid: Not tested   Puree Puree: Impaired Oral Phase Functional Implications: Prolonged oral transit Pharyngeal Phase Impairments: Suspected delayed Swallow   Solid     Solid: Impaired Oral Phase Impairments: Impaired mastication Oral Phase Functional Implications: Prolonged oral transit Pharyngeal Phase Impairments: Suspected delayed Swallow      Sonia Baller, MA, CCC-SLP Speech Therapy [facility] Pager: [phone]

## 2018-12-02 NOTE — Care Management Important Message (Signed)
Important Message  Patient Details  Name: [name] MRN: [id number] Date of Birth: 07-Aug-1921   Medicare Important Message Given:  Yes    Orbie Pyo 12/02/2018, 4:26 PM

## 2018-12-02 NOTE — Progress Notes (Signed)
12/02/18 1621  PT Visit Information  Last PT Received On 12/02/18  Assistance Needed +2  History of Present Illness Pt is a 85 y/o female admitted secondary to nausea, vomitting, diarrhea and weakness. Found to be dehydrated and with an AKI and to have Gastroenteritis. PMH including but not limited to hypertension, hypothyroidism, GERD, hyperlipidemia, paroxysmal SVT.   Subjective Data  Patient Stated Goal to be able to walk better  Precautions  Precautions Fall  Restrictions  Weight Bearing Restrictions No  Pain Assessment  Pain Assessment No/denies pain  Cognition  Arousal/Alertness Awake/alert  Behavior During Therapy [facility] for tasks assessed/performed  Overall Cognitive Status No family/caregiver present to determine baseline cognitive functioning  Bed Mobility  Overal bed mobility Needs Assistance  Bed Mobility Sit to Supine  Sit to supine Mod assist  General bed mobility comments Mod A for LE assist to come back to supine.   Transfers  Overall transfer level Needs assistance  Equipment used Rolling walker (2 wheeled)  Transfers Sit to/from Stand  Sit to Stand Min assist;Mod assist;+2 physical assistance  General transfer comment Min to mod A +2 (depending on fatigue) to stand. Less noticable L lateral lean initially, however, with fatigue, L lateral lean became more prominent.   Ambulation/Gait  Ambulation/Gait assistance Mod assist;+2 physical assistance  Gait Distance (Feet) 1 Feet  Assistive device Rolling walker (2 wheeled)  Gait Pattern/deviations Step-to pattern;Decreased step length - right;Decreased step length - left;Decreased weight shift to left;Antalgic  General Gait Details Limited gait distance from chair to bed. Pt with difficulty moving LLE and tended to drag LLE when taking step. Required assist with RW management. With fatigue, L lateral lean more noticeable. Required cues for upright posture.   Gait velocity Decreased   Balance  Overall balance assessment  Needs assistance  Sitting-balance support Feet supported  Sitting balance-Leahy Scale Fair  Postural control Left lateral lean  Standing balance support Bilateral upper extremity supported;During functional activity  Standing balance-Leahy Scale Poor  Standing balance comment Pt with L lateral lean and requiring external support for steanding balance.   Exercises  Exercises General Lower Extremity  General Exercises - Lower Extremity  Long Arc Quad AROM;Both;10 reps;Seated  Hip Flexion/Marching AROM;Both;10 reps;Seated  PT - End of Session  Equipment Utilized During Treatment Gait belt  Activity Tolerance Patient limited by fatigue  Patient left in bed;with call bell/phone within reach;with bed alarm set  Nurse Communication Mobility status   PT - Assessment/Plan  PT Plan Current plan remains appropriate  PT Visit Diagnosis Other abnormalities of gait and mobility (R26.89);Muscle weakness (generalized) (M62.81)  PT Frequency (ACUTE ONLY) Min 3X/week  Follow Up Recommendations SNF;Supervision/Assistance - 24 hour (Home first program if pt and family refuses)  PT equipment None recommended by PT  AM-PAC PT "6 Clicks" Mobility Outcome Measure (Version 2)  Help needed turning from your back to your side while in a flat bed without using bedrails? 3  Help needed moving from lying on your back to sitting on the side of a flat bed without using bedrails? 2  Help needed moving to and from a bed to a chair (including a wheelchair)? 2  Help needed standing up from a chair using your arms (e.g., wheelchair or bedside chair)? 2  Help needed to walk in hospital room? 2  Help needed climbing 3-5 steps with a railing?  1  6 Click Score 12  Consider Recommendation of Discharge To: CIR/SNF/LTACH  PT Goal Progression  Progress towards PT goals  Progressing toward goals  Acute Rehab PT Goals  PT Goal Formulation With patient  Time For Goal Achievement 12/13/18  Potential to Achieve Goals Good  PT  Time Calculation  PT Start Time (ACUTE ONLY) 1450  PT Stop Time (ACUTE ONLY) 1505  PT Time Calculation (min) (ACUTE ONLY) 15 min  PT General Charges  $$ ACUTE PT VISIT 1 Visit  PT Treatments  $Therapeutic Activity 8-22 mins   Saw pt for second session to attempt mobility progression. Pt initially with less of L lateral lean, however, as pt fatigued, L lateral lean became more prominent. Required mod A +2 for mobility tasks using RW. Pt continues to have difficulty taking steps, especially when stepping with LLE. Feel pt would benefit from SNF level therapies prior to d/c home given current mobility deficits. Will continue to follow acutely to maximize functional mobility independence and safety.    Leighton Ruff, PT, DPT  Acute Rehabilitation Services  Pager: [phone] Office: [phone]

## 2018-12-02 NOTE — Progress Notes (Signed)
Patient ID: [name], female   DOB: 02/03/1921, 85 y.o.   MRN: [id number]  PROGRESS NOTE    [name]  OYD:[id number] DOB: [birth year] DOA: 11/28/2018 PCP: Wenda Low, MD   Brief Narrative:  85 year old female with history of anemia, hypertension, hypothyroidism, GERD, hyperlipidemia, paroxysmal SVT presented on 11/28/2018 with generalized weakness, diarrhea and vomiting.  She was found to have acute kidney injury with creatinine of 3.25.  She was started on IV fluids.  Assessment & Plan:   Principal Problem:   AKI (acute kidney injury) ([place]) Active Problems:   Viral gastroenteritis   Generalized weakness   Chronic anemia   Thrombocytopenia ([facility])  Acute kidney injury -Likely prerenal due to severe dehydration from GI loss and concomitant ACE inhibitor use -Presented with creatinine of 3.25, baseline 1.1 two years ago. -Renal ultrasound was negative for hydronephrosis.  ACE inhibitor on hold. -Creatinine is 1.13.  Currently on bicarb drip.  Will DC bicarb drip.  Monitor. -Encourage patient to increase oral intake.  Probable UTI: Probably present on admission -Urinalysis suggestive of UTI.  Continue Rocephin.  Follow urine cultures.  Probable proctitis/colitis Gastroenteritis - GI PCR is negative.  LFTs normal.  Abdominal exam is benign.  Lipase only slightly elevated, not consistent with pancreatitis. -CT of the abdomen and pelvis showed distended gallbladder with CBD approximately 14 mm without bile duct stone.  LFTs normal.  Will get right upper quadrant ultrasound. -CT of the abdomen and pelvis also shows findings suggestive of proctitis/colitis with diverticulosis without diverticulitis. -Patient feels slightly better after starting antibiotics.  We will add Flagyl for now.  Use Imodium as needed.  Non anion gap metabolic acidosis -Likely related to diarrhea.  Improved.  Bicarb drip plan as above.  Generalized weakness -Due to severe dehydration and  probable viral illness. -Chest x-ray was negative for infiltrates.  Unlikely that patient has COVID-19. -Isolation has been discontinued. -PT recommends home health PT with supervision 24/7: If family cannot provide 24/7 supervision, needs SNF. -Patient is still very deconditioned.  Will ask PT to reevaluate the patient.  Hypokalemia -Replace.  Repeat a.m. labs  Hypomagnesemia -Improved  Chronic anemia--stable.    Thrombocytopenia -Questionable cause.  No signs of bleeding.  Monitor  Hypertension--continue metoprolol.  Blood pressure stable  Peripheral vascular disease-continue Plavix  Hypothyroidism -Continue Synthroid  GERD -Continue PPI  Depression--continue Zoloft    DVT prophylaxis: Subcutaneous heparin Code Status: DNR Family Communication: Spoke to daughter/[name] Easter on phone on 12/01/2018. Disposition Plan: Probable discharge in 1 to 2 days if clinically improves Consultants: None  Procedures: None  Antimicrobials: Rocephin from 12/01/2018 onwards   Subjective: Patient seen and examined at bedside.  She is more conversant today and states that she feels slightly better and her belly does not hurt that much.  Her diarrhea is improving.  No overnight fever or vomiting.  She still feels very weak. Objective: Vitals:   12/01/18 1349 12/01/18 2132 12/02/18 0500 12/02/18 0529  BP: (!) 142/49 (!) 148/64  (!) 161/56  Pulse: 62 67  68  Resp: 16     Temp: 98.2 F (36.8 C) 98.9 F (37.2 C)  98.4 F (36.9 C)  TempSrc: Oral Oral  Oral  SpO2: 90% 95%  95%  Weight:   52.5 kg   Height:        Intake/Output Summary (Last 24 hours) at 12/02/2018 0903 Last data filed at 12/02/2018 0600 Gross per 24 hour  Intake 960 ml  Output 450 ml  Net 510  ml   Filed Weights   11/28/18 2347 11/30/18 0452 12/02/18 0500  Weight: 49.1 kg 51.3 kg 52.5 kg    Examination:  General exam: Elderly female.  Poor historian.  Awake and answers some more questions today.  No acute  distress.  Looks chronically ill. Respiratory system: Bilateral decreased breath sounds at bases, scattered crackles.   Cardiovascular system: S1 & S2 heard, Rate controlled Gastrointestinal system: Abdomen is nondistended, soft and nontender today.  Normal bowel sounds heard. Extremities: No cyanosis, edema    Data Reviewed: I have personally reviewed following labs and imaging studies  CBC: Recent Labs  Lab 11/28/18 2145 11/29/18 0254 11/30/18 0329 12/01/18 0825 12/02/18 0344  WBC 7.8 6.8 6.8 4.7 3.3*  NEUTROABS 5.6  --  5.1 3.1 1.6*  HGB 10.8* 9.4* 10.8* 9.6* 8.8*  HCT 34.4* 30.5* 33.0* 29.8* 25.9*  MCV 97.2 93.0 93.5 89.2 87.8  PLT 142* 117* 132* 122* 478*   Basic Metabolic Panel: Recent Labs  Lab 11/28/18 1928 11/29/18 0254 11/30/18 0329 12/01/18 0825 12/02/18 0344  NA 135 136 139 140 142  K 3.5 3.3* 3.9 3.1* 2.6*  CL 104 108 115* 112* 108  CO2 17* 16* 12* 19* 24  GLUCOSE 103* 87 79 127* 114*  BUN 86* 87* 72* 41* 28*  CREATININE 3.25* 3.04* 1.87* 1.21* 1.13*  CALCIUM 10.1 9.3 8.8* 8.3* 8.1*  MG  --   --  1.5* 1.9 1.7   GFR: Estimated Creatinine Clearance: 22 mL/min (A) (by C-G formula based on SCr of 1.13 mg/dL (H)). Liver Function Tests: Recent Labs  Lab 11/28/18 2003 12/02/18 0344  AST 19 16  ALT 9 8  ALKPHOS 76 61  BILITOT 0.7 0.6  PROT 6.8 4.9*  ALBUMIN 3.6 2.4*   Recent Labs  Lab 11/28/18 2145  LIPASE 80*   No results for input(s): AMMONIA in the last 168 hours. Coagulation Profile: No results for input(s): INR, PROTIME in the last 168 hours. Cardiac Enzymes: No results for input(s): CKTOTAL, CKMB, CKMBINDEX, TROPONINI in the last 168 hours. BNP (last 3 results) No results for input(s): PROBNP in the last 8760 hours. HbA1C: No results for input(s): HGBA1C in the last 72 hours. CBG: No results for input(s): GLUCAP in the last 168 hours. Lipid Profile: No results for input(s): CHOL, HDL, LDLCALC, TRIG, CHOLHDL, LDLDIRECT in the last 72  hours. Thyroid Function Tests: No results for input(s): TSH, T4TOTAL, FREET4, T3FREE, THYROIDAB in the last 72 hours. Anemia Panel: No results for input(s): VITAMINB12, FOLATE, FERRITIN, TIBC, IRON, RETICCTPCT in the last 72 hours. Sepsis Labs: Recent Labs  Lab 11/28/18 1928  LATICACIDVEN 0.9    Recent Results (from the past 240 hour(s))  Gastrointestinal Panel by PCR , Stool     Status: None   Collection Time: 11/28/18  9:48 PM  Result Value Ref Range Status   Campylobacter species NOT DETECTED NOT DETECTED Final   Plesimonas shigelloides NOT DETECTED NOT DETECTED Final   Salmonella species NOT DETECTED NOT DETECTED Final   Yersinia enterocolitica NOT DETECTED NOT DETECTED Final   Vibrio species NOT DETECTED NOT DETECTED Final   Vibrio cholerae NOT DETECTED NOT DETECTED Final   Enteroaggregative E coli (EAEC) NOT DETECTED NOT DETECTED Final   Enteropathogenic E coli (EPEC) NOT DETECTED NOT DETECTED Final   Enterotoxigenic E coli (ETEC) NOT DETECTED NOT DETECTED Final   Shiga like toxin producing E coli (STEC) NOT DETECTED NOT DETECTED Final   Shigella/Enteroinvasive E coli (EIEC) NOT DETECTED NOT DETECTED  Final   Cryptosporidium NOT DETECTED NOT DETECTED Final   Cyclospora cayetanensis NOT DETECTED NOT DETECTED Final   Entamoeba histolytica NOT DETECTED NOT DETECTED Final   Giardia lamblia NOT DETECTED NOT DETECTED Final   Adenovirus F40/41 NOT DETECTED NOT DETECTED Final   Astrovirus NOT DETECTED NOT DETECTED Final   Norovirus GI/GII NOT DETECTED NOT DETECTED Final   Rotavirus A NOT DETECTED NOT DETECTED Final   Sapovirus (I, II, IV, and V) NOT DETECTED NOT DETECTED Final    Comment: Performed at [facility], [address], [place], [place] [address]         Radiology Studies: Ct Abdomen Pelvis Wo Contrast  Result Date: 12/01/2018 CLINICAL DATA:  Abdominal pain. EXAM: CT ABDOMEN AND PELVIS WITHOUT CONTRAST TECHNIQUE: Multidetector CT imaging of the abdomen  and pelvis was performed following the standard protocol without IV contrast. COMPARISON:  CT abdomen dated 08/22/2013. FINDINGS: Lower chest: Bibasilar pleural effusions, RIGHT greater than LEFT, incompletely imaged with associated compressive atelectasis. Hepatobiliary: Gallbladder is prominently distended. No focal liver abnormality. Common bile duct is distended to approximately 14 mm. No bile duct stone seen. Pancreas: Unremarkable. No pancreatic ductal dilatation or surrounding inflammatory changes. Spleen: Splenic cyst versus hemangioma, stable. Otherwise unremarkable. Adrenals/Urinary Tract: Adrenal glands appear normal. 3 mm nonobstructing LEFT renal stone. No hydronephrosis bilaterally. No ureteral or bladder calculi identified. Bladder appears normal. Stomach/Bowel: Extensive diverticulosis of the descending and sigmoid colon but no focal inflammatory changes seen to confirm an acute diverticulitis. No dilated large or small bowel loops. No convincing evidence of active bowel wall inflammation. Questionable thickening of the walls of the rectum. Surgical changes of previous appendectomy. Stomach is unremarkable, partially decompressed. Vascular/Lymphatic: Aortic atherosclerosis. No enlarged lymph nodes seen. Reproductive: Status post hysterectomy. No adnexal masses. Other: Small amount of free fluid in the pelvis. No other free fluid. No abscess collection seen. No free intraperitoneal air. Musculoskeletal: No acute or suspicious osseous finding. Degenerative spondylosis of the slightly scoliotic thoracolumbar spine, mild to moderate in degree. Compression fracture deformity of the L1 vertebral body, 75-80% compressed anteriorly/centrally with minimal retropulsion, chronic based on appearance, increased compared to interval lumbar spine CT of 10/27/2016. Ill-defined fluid/edema within the subcutaneous soft tissues of the lower abdomen and pelvis suggesting some degree of anasarca. IMPRESSION: 1.  Gallbladder is markedly distended. Common bile duct is distended to approximately 14 mm. No bile duct stone seen. Recommend correlation with liver function tests. Would also consider right upper quadrant ultrasound for further characterization. 2. Colonic diverticulosis without evidence of acute diverticulitis. 3. Questionable thickening of the walls of the rectosigmoid colon, raising the possibility of proctitis/colitis of infectious or inflammatory nature. 4. Small amount of free fluid in the pelvis. No abscess collection seen. No free intraperitoneal air. 5. Bibasilar pleural effusions, right greater than left, incompletely imaged, with associated compressive atelectasis. 6. Ill-defined fluid/edema within the subcutaneous soft tissues of the pelvis suggesting some degree of anasarca. 7. Additional chronic/incidental findings detailed above. Aortic Atherosclerosis (ICD10-I70.0). Electronically Signed   By: Franki Cabot M.D.   On: 12/01/2018 16:04        Scheduled Meds:  brimonidine  1 drop Left Eye TID   clopidogrel  75 mg Oral QODAY   dorzolamide-timolol  1 drop Left Eye BID   feeding supplement (ENSURE ENLIVE)  237 mL Oral BID BM   ferrous sulfate  325 mg Oral QPM   gemfibrozil  600 mg Oral BID AC   heparin  5,000 Units Subcutaneous Q8H  latanoprost  1 drop Both Eyes QHS   levothyroxine  25 mcg Oral QODAY   levothyroxine  50 mcg Oral QODAY   metoprolol tartrate  50 mg Oral BID   pantoprazole  40 mg Oral BID AC   potassium chloride  40 mEq Oral Q4H   sertraline  100 mg Oral QHS   Continuous Infusions:  cefTRIAXone (ROCEPHIN)  IV 1 g (12/01/18 1025)   metronidazole 500 mg (12/02/18 0856)     LOS: 3 days        Aline August, MD [facility] 12/02/2018, 9:03 AM

## 2018-12-03 ENCOUNTER — Inpatient Hospital Stay (HOSPITAL_COMMUNITY): Payer: Medicare Other

## 2018-12-03 DIAGNOSIS — K838 Other specified diseases of biliary tract: Secondary | ICD-10-CM

## 2018-12-03 LAB — BASIC METABOLIC PANEL
Anion gap: 9 (ref 5–15)
BUN: 17 mg/dL (ref 8–23)
CO2: 22 mmol/L (ref 22–32)
Calcium: 7.9 mg/dL — ABNORMAL LOW (ref 8.9–10.3)
Chloride: 112 mmol/L — ABNORMAL HIGH (ref 98–111)
Creatinine, Ser: 1.07 mg/dL — ABNORMAL HIGH (ref 0.44–1.00)
GFR calc Af Amer: 51 mL/min — ABNORMAL LOW (ref >60)
GFR calc non Af Amer: 44 mL/min — ABNORMAL LOW (ref >60)
Glucose, Bld: 106 mg/dL — ABNORMAL HIGH (ref 70–99)
Potassium: 3.8 mmol/L (ref 3.5–5.1)
Sodium: 143 mmol/L (ref 135–145)

## 2018-12-03 LAB — CBC WITH DIFFERENTIAL/PLATELET
Abs Immature Granulocytes: 0.01 10*3/uL (ref 0.00–0.07)
Basophils Absolute: 0 10*3/uL (ref 0.0–0.1)
Basophils Relative: 1 %
Eosinophils Absolute: 0.2 10*3/uL (ref 0.0–0.5)
Eosinophils Relative: 4 %
HCT: 28.9 % — ABNORMAL LOW (ref 36.0–46.0)
Hemoglobin: 9.3 g/dL — ABNORMAL LOW (ref 12.0–15.0)
Immature Granulocytes: 0 %
Lymphocytes Relative: 29 %
Lymphs Abs: 1.1 10*3/uL (ref 0.7–4.0)
MCH: 28.7 pg (ref 26.0–34.0)
MCHC: 32.2 g/dL (ref 30.0–36.0)
MCV: 89.2 fL (ref 80.0–100.0)
Monocytes Absolute: 0.6 10*3/uL (ref 0.1–1.0)
Monocytes Relative: 16 %
Neutro Abs: 2 10*3/uL (ref 1.7–7.7)
Neutrophils Relative %: 50 %
Platelets: 134 10*3/uL — ABNORMAL LOW (ref 150–400)
RBC: 3.24 MIL/uL — ABNORMAL LOW (ref 3.87–5.11)
RDW: 16.9 % — ABNORMAL HIGH (ref 11.5–15.5)
WBC: 3.9 10*3/uL — ABNORMAL LOW (ref 4.0–10.5)
nRBC: 0 % (ref 0.0–0.2)

## 2018-12-03 LAB — MAGNESIUM: Magnesium: 1.6 mg/dL — ABNORMAL LOW (ref 1.7–2.4)

## 2018-12-03 MED ORDER — HYDRALAZINE HCL 20 MG/ML IJ SOLN
5.0000 mg | Freq: Four times a day (QID) | INTRAMUSCULAR | Status: DC | PRN
  Administered 2018-12-11: 5 mg via INTRAVENOUS
  Filled 2018-12-03: qty 1

## 2018-12-03 MED ORDER — HYDRALAZINE HCL 20 MG/ML IJ SOLN
5.0000 mg | Freq: Once | INTRAMUSCULAR | Status: AC
  Administered 2018-12-03: 5 mg via INTRAVENOUS

## 2018-12-03 MED ORDER — GADOBUTROL 1 MMOL/ML IV SOLN
6.0000 mL | Freq: Once | INTRAVENOUS | Status: AC | PRN
  Administered 2018-12-03: 16:00:00 6 mL via INTRAVENOUS

## 2018-12-03 MED ORDER — METRONIDAZOLE 500 MG PO TABS
500.0000 mg | ORAL_TABLET | Freq: Three times a day (TID) | ORAL | Status: DC
  Administered 2018-12-03 – 2018-12-06 (×9): 500 mg via ORAL
  Filled 2018-12-03 (×9): qty 1

## 2018-12-03 MED ORDER — HYDRALAZINE HCL 20 MG/ML IJ SOLN
INTRAMUSCULAR | Status: AC
  Filled 2018-12-03: qty 1

## 2018-12-03 MED ORDER — DILTIAZEM HCL ER COATED BEADS 120 MG PO CP24
120.0000 mg | ORAL_CAPSULE | Freq: Every day | ORAL | Status: DC
  Administered 2018-12-03 – 2018-12-11 (×9): 120 mg via ORAL
  Filled 2018-12-03 (×9): qty 1

## 2018-12-03 NOTE — Progress Notes (Addendum)
Patient ID: [name], female   DOB: 04-Aug-1921, 85 y.o.   MRN: [id number]  PROGRESS NOTE    CHRISTINA GINTZ  UDJ:[id number] DOB: 07-31-1921 DOA: 11/28/2018 PCP: Wenda Low, MD   Brief Narrative:  85 year old female with history of anemia, hypertension, hypothyroidism, GERD, hyperlipidemia, paroxysmal SVT presented on 11/28/2018 with generalized weakness, diarrhea and vomiting.  She was found to have acute kidney injury with creatinine of 3.25.  She was started on IV fluids.  Assessment & Plan:   Principal Problem:   AKI (acute kidney injury) ([place]) Active Problems:   Viral gastroenteritis   Generalized weakness   Chronic anemia   Thrombocytopenia ([facility])  Acute kidney injury -Likely prerenal due to severe dehydration from GI loss and concomitant ACE inhibitor use -Presented with creatinine of 3.25, baseline 1.1 two years ago. -Renal ultrasound was negative for hydronephrosis.  ACE inhibitor on hold. -Creatinine is 1.07 today.  bicarb drip discontinued on 12/02/2018.  Monitor. -Encourage patient to increase oral intake.  Probable UTI: Probably present on admission -Urinalysis suggestive of UTI.  Continue Rocephin.  Follow urine cultures.  Probable proctitis/colitis Gastroenteritis - GI PCR is negative.  LFTs normal.  Abdominal exam is benign.  Lipase only slightly elevated, not consistent with pancreatitis. -CT of the abdomen and pelvis showed distended gallbladder with CBD approximately 14 mm without bile duct stone.  LFTs normal.  right upper quadrant ultrasound showing dilated CBD and intrahepatic duct.  There is a question of obstruction.  Will get MRCP. -CT of the abdomen and pelvis also showed findings suggestive of proctitis/colitis with diverticulosis without diverticulitis. -Currently on Rocephin and Flagyl.  Still having significant diarrhea.  Will get C. difficile testing.  Use Imodium as needed. -DC gemfibrozil.  Non anion gap metabolic acidosis -Likely  related to diarrhea.  Improved.  Off bicarb drip. Generalized weakness -Due to severe dehydration and probable viral illness. -Chest x-ray was negative for infiltrates.  Unlikely that patient has COVID-19. -Isolation has been discontinued. -PT recommends SNF placement.  Will consult social worker.  Hypokalemia -Improved  Hypomagnesemia -Replace.  Repeat a.m. labs  Anemia of chronic disease--stable.    Thrombocytopenia -Questionable cause.  No signs of bleeding.  Monitor  Hypertension--continue metoprolol.  Blood pressure on the higher side.  Will resume home Cardizem.  Peripheral vascular disease-continue Plavix  Hypothyroidism -Continue Synthroid  GERD -Continue PPI  Depression--continue Zoloft    DVT prophylaxis: Subcutaneous heparin Code Status: DNR Family Communication: Spoke to daughter/[name] Easter on phone on 12/01/2018. Disposition Plan: Probable discharge in 1 to 2 days if clinically improves Consultants: None  Procedures: None  Antimicrobials: Rocephin from 12/01/2018 onwards   Subjective: Patient seen and examined at bedside.  She is and does not feel well today.  Cannot elaborate her symptoms.  Appetite is poor.  Nursing staff reports that she is very weak.  Still having significant diarrhea for which she had to put a rectal pouch.  No overnight vomiting or fever. Objective: Vitals:   12/02/18 2300 12/03/18 0002 12/03/18 0422 12/03/18 0500  BP: (!) 161/65 (!) 156/63 (!) 173/62 (!) 164/63  Pulse: 64 66 70   Resp:   18   Temp:   98.7 F (37.1 C)   TempSrc:   Oral   SpO2:  95% 93%   Weight:      Height:        Intake/Output Summary (Last 24 hours) at 12/03/2018 0828 Last data filed at 12/03/2018 0700 Gross per 24 hour  Intake 380 ml  Output 650 ml  Net -270 ml   Filed Weights   11/28/18 2347 11/30/18 0452 12/02/18 0500  Weight: 49.1 kg 51.3 kg 52.5 kg    Examination:  General exam: Elderly female.  Poor historian.  Awake, no acute  distress.  Answers only some questions.   Respiratory system: Bilateral decreased breath sounds at bases, scattered crackles.  No wheezing Cardiovascular system: Rate controlled, S1-S2 heard  gastrointestinal system: Abdomen is nondistended, soft and mildly tender in the epigastric and periumbilical regions.  Normal bowel sounds heard. Extremities: No cyanosis, edema  Skin: No rash or petechia Psych: Flat affect CNS: Moving extremities.  No focal neurologic deficit Lymph: No cervical lymphadenopathy   Data Reviewed: I have personally reviewed following labs and imaging studies  CBC: Recent Labs  Lab 11/28/18 2145 11/29/18 0254 11/30/18 0329 12/01/18 0825 12/02/18 0344 12/03/18 0350  WBC 7.8 6.8 6.8 4.7 3.3* 3.9*  NEUTROABS 5.6  --  5.1 3.1 1.6* 2.0  HGB 10.8* 9.4* 10.8* 9.6* 8.8* 9.3*  HCT 34.4* 30.5* 33.0* 29.8* 25.9* 28.9*  MCV 97.2 93.0 93.5 89.2 87.8 89.2  PLT 142* 117* 132* 122* 105* 867*   Basic Metabolic Panel: Recent Labs  Lab 11/29/18 0254 11/30/18 0329 12/01/18 0825 12/02/18 0344 12/03/18 0350  NA 136 139 140 142 143  K 3.3* 3.9 3.1* 2.6* 3.8  CL 108 115* 112* 108 112*  CO2 16* 12* 19* 24 22  GLUCOSE 87 79 127* 114* 106*  BUN 87* 72* 41* 28* 17  CREATININE 3.04* 1.87* 1.21* 1.13* 1.07*  CALCIUM 9.3 8.8* 8.3* 8.1* 7.9*  MG  --  1.5* 1.9 1.7 1.6*   GFR: Estimated Creatinine Clearance: 23.3 mL/min (A) (by C-G formula based on SCr of 1.07 mg/dL (H)). Liver Function Tests: Recent Labs  Lab 11/28/18 2003 12/02/18 0344  AST 19 16  ALT 9 8  ALKPHOS 76 61  BILITOT 0.7 0.6  PROT 6.8 4.9*  ALBUMIN 3.6 2.4*   Recent Labs  Lab 11/28/18 2145  LIPASE 80*   No results for input(s): AMMONIA in the last 168 hours. Coagulation Profile: No results for input(s): INR, PROTIME in the last 168 hours. Cardiac Enzymes: No results for input(s): CKTOTAL, CKMB, CKMBINDEX, TROPONINI in the last 168 hours. BNP (last 3 results) No results for input(s): PROBNP in the  last 8760 hours. HbA1C: No results for input(s): HGBA1C in the last 72 hours. CBG: No results for input(s): GLUCAP in the last 168 hours. Lipid Profile: No results for input(s): CHOL, HDL, LDLCALC, TRIG, CHOLHDL, LDLDIRECT in the last 72 hours. Thyroid Function Tests: No results for input(s): TSH, T4TOTAL, FREET4, T3FREE, THYROIDAB in the last 72 hours. Anemia Panel: No results for input(s): VITAMINB12, FOLATE, FERRITIN, TIBC, IRON, RETICCTPCT in the last 72 hours. Sepsis Labs: Recent Labs  Lab 11/28/18 1928  LATICACIDVEN 0.9    Recent Results (from the past 240 hour(s))  Gastrointestinal Panel by PCR , Stool     Status: None   Collection Time: 11/28/18  9:48 PM  Result Value Ref Range Status   Campylobacter species NOT DETECTED NOT DETECTED Final   Plesimonas shigelloides NOT DETECTED NOT DETECTED Final   Salmonella species NOT DETECTED NOT DETECTED Final   Yersinia enterocolitica NOT DETECTED NOT DETECTED Final   Vibrio species NOT DETECTED NOT DETECTED Final   Vibrio cholerae NOT DETECTED NOT DETECTED Final   Enteroaggregative E coli (EAEC) NOT DETECTED NOT DETECTED Final   Enteropathogenic E coli (EPEC) NOT DETECTED NOT DETECTED Final  Enterotoxigenic E coli (ETEC) NOT DETECTED NOT DETECTED Final   Shiga like toxin producing E coli (STEC) NOT DETECTED NOT DETECTED Final   Shigella/Enteroinvasive E coli (EIEC) NOT DETECTED NOT DETECTED Final   Cryptosporidium NOT DETECTED NOT DETECTED Final   Cyclospora cayetanensis NOT DETECTED NOT DETECTED Final   Entamoeba histolytica NOT DETECTED NOT DETECTED Final   Giardia lamblia NOT DETECTED NOT DETECTED Final   Adenovirus F40/41 NOT DETECTED NOT DETECTED Final   Astrovirus NOT DETECTED NOT DETECTED Final   Norovirus GI/GII NOT DETECTED NOT DETECTED Final   Rotavirus A NOT DETECTED NOT DETECTED Final   Sapovirus (I, II, IV, and V) NOT DETECTED NOT DETECTED Final    Comment: Performed at [facility], [address] [place], [place] [address]  Culture, Urine     Status: Abnormal (Preliminary result)   Collection Time: 12/01/18  9:12 AM  Result Value Ref Range Status   Specimen Description URINE, CLEAN CATCH  Final   Special Requests NONE  Final   Culture (A)  Final    >=100,000 COLONIES/mL ESCHERICHIA COLI CULTURE REINCUBATED FOR BETTER GROWTH Performed at [facility] [address], [place], [place] [address]    Report Status PENDING  Incomplete         Radiology Studies: Ct Abdomen Pelvis Wo Contrast  Result Date: 12/01/2018 CLINICAL DATA:  Abdominal pain. EXAM: CT ABDOMEN AND PELVIS WITHOUT CONTRAST TECHNIQUE: Multidetector CT imaging of the abdomen and pelvis was performed following the standard protocol without IV contrast. COMPARISON:  CT abdomen dated 08/22/2013. FINDINGS: Lower chest: Bibasilar pleural effusions, RIGHT greater than LEFT, incompletely imaged with associated compressive atelectasis. Hepatobiliary: Gallbladder is prominently distended. No focal liver abnormality. Common bile duct is distended to approximately 14 mm. No bile duct stone seen. Pancreas: Unremarkable. No pancreatic ductal dilatation or surrounding inflammatory changes. Spleen: Splenic cyst versus hemangioma, stable. Otherwise unremarkable. Adrenals/Urinary Tract: Adrenal glands appear normal. 3 mm nonobstructing LEFT renal stone. No hydronephrosis bilaterally. No ureteral or bladder calculi identified. Bladder appears normal. Stomach/Bowel: Extensive diverticulosis of the descending and sigmoid colon but no focal inflammatory changes seen to confirm an acute diverticulitis. No dilated large or small bowel loops. No convincing evidence of active bowel wall inflammation. Questionable thickening of the walls of the rectum. Surgical changes of previous appendectomy. Stomach is unremarkable, partially decompressed. Vascular/Lymphatic: Aortic atherosclerosis. No enlarged lymph nodes seen. Reproductive: Status post  hysterectomy. No adnexal masses. Other: Small amount of free fluid in the pelvis. No other free fluid. No abscess collection seen. No free intraperitoneal air. Musculoskeletal: No acute or suspicious osseous finding. Degenerative spondylosis of the slightly scoliotic thoracolumbar spine, mild to moderate in degree. Compression fracture deformity of the L1 vertebral body, 75-80% compressed anteriorly/centrally with minimal retropulsion, chronic based on appearance, increased compared to interval lumbar spine CT of 10/27/2016. Ill-defined fluid/edema within the subcutaneous soft tissues of the lower abdomen and pelvis suggesting some degree of anasarca. IMPRESSION: 1. Gallbladder is markedly distended. Common bile duct is distended to approximately 14 mm. No bile duct stone seen. Recommend correlation with liver function tests. Would also consider right upper quadrant ultrasound for further characterization. 2. Colonic diverticulosis without evidence of acute diverticulitis. 3. Questionable thickening of the walls of the rectosigmoid colon, raising the possibility of proctitis/colitis of infectious or inflammatory nature. 4. Small amount of free fluid in the pelvis. No abscess collection seen. No free intraperitoneal air. 5. Bibasilar pleural effusions, right greater than left, incompletely imaged, with associated compressive atelectasis.  6. Ill-defined fluid/edema within the subcutaneous soft tissues of the pelvis suggesting some degree of anasarca. 7. Additional chronic/incidental findings detailed above. Aortic Atherosclerosis (ICD10-I70.0). Electronically Signed   By: Franki Cabot M.D.   On: 12/01/2018 16:04   US Abdomen Limited Ruq  Result Date: 12/03/2018 CLINICAL DATA:  Dilated common bile duct and common bile duct. EXAM: ULTRASOUND ABDOMEN LIMITED RIGHT UPPER QUADRANT COMPARISON:  CT 12/01/2018 FINDINGS: Gallbladder: No gallstones or wall thickening visualized. There is a small amount of sludge in the  gallbladder. The gallbladder is distended with a diameter of 5.2 cm. Common bile duct: Diameter: 13 mm. Liver: No focal lesion identified. Within normal limits in parenchymal echogenicity. Biliary dilatation within the hepatic parenchyma is noted. Portal vein is patent on color Doppler imaging with normal direction of blood flow towards the liver. Additional findings: A right pleural effusion is noted. IMPRESSION: Stable gallbladder dilatation. Common bile duct is dilated. Intrahepatic biliary dilatation is also noted. Findings suggest common bile duct obstruction. MRCP may be helpful. Incidental right pleural effusion. Electronically Signed   By: Marybelle Killings M.D.   On: 12/03/2018 07:46        Scheduled Meds:  brimonidine  1 drop Left Eye TID   clopidogrel  75 mg Oral QODAY   dorzolamide-timolol  1 drop Left Eye BID   feeding supplement (ENSURE ENLIVE)  237 mL Oral BID BM   ferrous sulfate  325 mg Oral QPM   gemfibrozil  600 mg Oral BID AC   heparin  5,000 Units Subcutaneous Q8H   latanoprost  1 drop Both Eyes QHS   levothyroxine  25 mcg Oral QODAY   levothyroxine  50 mcg Oral QODAY   metoprolol tartrate  50 mg Oral BID   pantoprazole  40 mg Oral BID AC   sertraline  100 mg Oral QHS   Continuous Infusions:  cefTRIAXone (ROCEPHIN)  IV 1 g (12/02/18 1035)   metronidazole 500 mg (12/02/18 2216)     LOS: 4 days        Aline August, MD [facility] 12/03/2018, 8:28 AM

## 2018-12-03 NOTE — Progress Notes (Signed)
[name] has bp 173/62 HR 70. [facility] notified. Arthor Captain LPN

## 2018-12-04 DIAGNOSIS — R933 Abnormal findings on diagnostic imaging of other parts of digestive tract: Secondary | ICD-10-CM

## 2018-12-04 DIAGNOSIS — B9689 Other specified bacterial agents as the cause of diseases classified elsewhere: Secondary | ICD-10-CM | POA: Diagnosis present

## 2018-12-04 DIAGNOSIS — K8050 Calculus of bile duct without cholangitis or cholecystitis without obstruction: Secondary | ICD-10-CM

## 2018-12-04 DIAGNOSIS — B961 Klebsiella pneumoniae [K. pneumoniae] as the cause of diseases classified elsewhere: Secondary | ICD-10-CM

## 2018-12-04 DIAGNOSIS — A084 Viral intestinal infection, unspecified: Secondary | ICD-10-CM

## 2018-12-04 DIAGNOSIS — K6289 Other specified diseases of anus and rectum: Secondary | ICD-10-CM | POA: Diagnosis present

## 2018-12-04 DIAGNOSIS — B9620 Unspecified Escherichia coli [E. coli] as the cause of diseases classified elsewhere: Secondary | ICD-10-CM

## 2018-12-04 DIAGNOSIS — N390 Urinary tract infection, site not specified: Secondary | ICD-10-CM | POA: Diagnosis present

## 2018-12-04 DIAGNOSIS — E876 Hypokalemia: Secondary | ICD-10-CM | POA: Diagnosis present

## 2018-12-04 LAB — CBC WITH DIFFERENTIAL/PLATELET
Abs Immature Granulocytes: 0.02 K/uL (ref 0.00–0.07)
Basophils Absolute: 0 K/uL (ref 0.0–0.1)
Basophils Relative: 1 %
Eosinophils Absolute: 0.2 K/uL (ref 0.0–0.5)
Eosinophils Relative: 4 %
HCT: 31 % — ABNORMAL LOW (ref 36.0–46.0)
Hemoglobin: 10.2 g/dL — ABNORMAL LOW (ref 12.0–15.0)
Immature Granulocytes: 0 %
Lymphocytes Relative: 22 %
Lymphs Abs: 1.3 K/uL (ref 0.7–4.0)
MCH: 29.5 pg (ref 26.0–34.0)
MCHC: 32.9 g/dL (ref 30.0–36.0)
MCV: 89.6 fL (ref 80.0–100.0)
Monocytes Absolute: 0.7 K/uL (ref 0.1–1.0)
Monocytes Relative: 12 %
Neutro Abs: 3.5 K/uL (ref 1.7–7.7)
Neutrophils Relative %: 61 %
Platelets: 183 K/uL (ref 150–400)
RBC: 3.46 MIL/uL — ABNORMAL LOW (ref 3.87–5.11)
RDW: 16.9 % — ABNORMAL HIGH (ref 11.5–15.5)
WBC: 5.6 K/uL (ref 4.0–10.5)
nRBC: 0 % (ref 0.0–0.2)

## 2018-12-04 LAB — URINE CULTURE: Culture: >=100000 — AB

## 2018-12-04 LAB — COMPREHENSIVE METABOLIC PANEL
ALT: 11 U/L (ref 0–44)
AST: 19 U/L (ref 15–41)
Albumin: 2.9 g/dL — ABNORMAL LOW (ref 3.5–5.0)
Alkaline Phosphatase: 66 U/L (ref 38–126)
Anion gap: 14 (ref 5–15)
BUN: 15 mg/dL (ref 8–23)
CO2: 21 mmol/L — ABNORMAL LOW (ref 22–32)
Calcium: 8.8 mg/dL — ABNORMAL LOW (ref 8.9–10.3)
Chloride: 107 mmol/L (ref 98–111)
Creatinine, Ser: 1.08 mg/dL — ABNORMAL HIGH (ref 0.44–1.00)
GFR calc Af Amer: 51 mL/min — ABNORMAL LOW (ref >60)
GFR calc non Af Amer: 44 mL/min — ABNORMAL LOW (ref >60)
Glucose, Bld: 91 mg/dL (ref 70–99)
Potassium: 3.4 mmol/L — ABNORMAL LOW (ref 3.5–5.1)
Sodium: 142 mmol/L (ref 135–145)
Total Bilirubin: 0.7 mg/dL (ref 0.3–1.2)
Total Protein: 5.7 g/dL — ABNORMAL LOW (ref 6.5–8.1)

## 2018-12-04 LAB — MAGNESIUM: Magnesium: 1.6 mg/dL — ABNORMAL LOW (ref 1.7–2.4)

## 2018-12-04 LAB — C DIFFICILE QUICK SCREEN W PCR REFLEX
C Diff interpretation: NOT DETECTED
C Diff toxin: NEGATIVE

## 2018-12-04 LAB — PHOSPHORUS: Phosphorus: <1 mg/dL — CL (ref 2.5–4.6)

## 2018-12-04 LAB — C DIFFICILE QUICK SCREEN W PCR REFLEX??: C Diff antigen: NEGATIVE

## 2018-12-04 MED ORDER — HYDRALAZINE HCL 25 MG PO TABS
25.0000 mg | ORAL_TABLET | Freq: Two times a day (BID) | ORAL | Status: DC
  Administered 2018-12-05 – 2018-12-06 (×4): 25 mg via ORAL
  Filled 2018-12-04 (×4): qty 1

## 2018-12-04 MED ORDER — POTASSIUM CHLORIDE CRYS ER 20 MEQ PO TBCR
40.0000 meq | EXTENDED_RELEASE_TABLET | Freq: Once | ORAL | Status: AC
  Administered 2018-12-04: 40 meq via ORAL
  Filled 2018-12-04: qty 2

## 2018-12-04 MED ORDER — POTASSIUM PHOSPHATES 15 MMOLE/5ML IV SOLN
40.0000 meq | Freq: Once | INTRAVENOUS | Status: AC
  Administered 2018-12-04: 40 meq via INTRAVENOUS
  Filled 2018-12-04: qty 9.09

## 2018-12-04 MED ORDER — MAGNESIUM SULFATE 4 GM/100ML IV SOLN
4.0000 g | Freq: Once | INTRAVENOUS | Status: AC
  Administered 2018-12-04: 4 g via INTRAVENOUS
  Filled 2018-12-04: qty 100

## 2018-12-04 MED ORDER — HYDRALAZINE HCL 25 MG PO TABS
25.0000 mg | ORAL_TABLET | Freq: Three times a day (TID) | ORAL | Status: DC
  Administered 2018-12-04 (×2): 25 mg via ORAL
  Filled 2018-12-04 (×2): qty 1

## 2018-12-04 NOTE — Progress Notes (Signed)
During shift assessment engaged in conversation with patient to further assess orientation. Patient oriented A&O 3-4; some disorientation to current time and sequence of events. Patient able to discuss family and events (ex: likes completing puzzles with friends) and stated she has someone that stays with her during the day and her daughter brings her meals in the evening.   Patient communicated would like to get out of bed more and work with PT again and has history of left hip replacement. Informed day RN.  Patient expressed concerns about continued hospitalization and desire to be discharged. Informed patient care team will continue to assess patient and determine discharge plan.

## 2018-12-04 NOTE — Progress Notes (Signed)
CRITICAL VALUE ALERT  Critical Value:  Phosphorus level <1.0   Date & Time Notied: 09:43   Provider Notified:  MD Grandville Silos

## 2018-12-04 NOTE — Progress Notes (Signed)
Physical Therapy Treatment Patient Details Name: [name] MRN: [id number] DOB: 1921/05/03 Today's Date: 12/04/2018    History of Present Illness Pt is a 85 y/o female admitted secondary to nausea, vomitting, diarrhea and weakness. Found to be dehydrated and with an AKI and to have Gastroenteritis. PMH including but not limited to hypertension, hypothyroidism, GERD, hyperlipidemia, paroxysmal SVT.     PT Comments    Pt progressing towards goals and improved balance noted during transfers and short distance gait. However, pt with incontinence of both bowel and bladder, so mobility limited. Continue to feel pt would benefit from SNF level therapies given current deficits. Will continue to follow acutely to maximize functional mobility independence and safety.   Follow Up Recommendations  SNF;Supervision/Assistance - 24 hour(if refuses, home first and 24/7)     Equipment Recommendations  None recommended by PT    Recommendations for Other Services       Precautions / Restrictions Precautions Precautions: Fall Precaution Comments: pt incontinent upon standing Restrictions Weight Bearing Restrictions: No    Mobility  Bed Mobility Overal bed mobility: Needs Assistance Bed Mobility: Rolling;Sidelying to Sit Rolling: Min assist Sidelying to sit: Mod assist       General bed mobility comments: Min A for assist with rolling for clean up following BM. Mod A to come to sitting.   Transfers Overall transfer level: Needs assistance Equipment used: Rolling walker (2 wheeled) Transfers: Sit to/from [facility] Sit to Stand: Mod assist;+2 safety/equipment Stand pivot transfers: Min assist;+2 safety/equipment       General transfer comment: Mod A for lift assist to stand. Upon standing, pt with incontinence of bowel and bladder upon standing and had to transfer to [facility]. Much improved balance and ability to take steps towards BSC. Min A for steadying.    Ambulation/Gait Ambulation/Gait assistance: Min assist;+2 safety/equipment Gait Distance (Feet): 1 Feet Assistive device: Rolling walker (2 wheeled) Gait Pattern/deviations: Step-to pattern;Decreased step length - right;Decreased step length - left;Decreased weight shift to left;Antalgic Gait velocity: Decreased    General Gait Details: Limited gait secondary to incontinence. Much improved steps to recliner from [facility] this session. Min A For steadying. Cues for appropriate use of RW.    Stairs             Wheelchair Mobility    Modified Rankin (Stroke Patients Only)       Balance Overall balance assessment: Needs assistance Sitting-balance support: Feet supported Sitting balance-Leahy Scale: Fair     Standing balance support: Bilateral upper extremity supported;During functional activity Standing balance-Leahy Scale: Poor Standing balance comment: Reliant on BUE support                             Cognition Arousal/Alertness: Awake/alert Behavior During Therapy: WFL for tasks assessed/performed Overall Cognitive Status: No family/caregiver present to determine baseline cognitive functioning                                        Exercises      General Comments        Pertinent Vitals/Pain Pain Assessment: No/denies pain    Home Living                      Prior Function            PT Goals (current goals can  now be found in the care plan section) Acute Rehab PT Goals Patient Stated Goal: to be able to walk better PT Goal Formulation: With patient Time For Goal Achievement: 12/13/18 Potential to Achieve Goals: Good Progress towards PT goals: Progressing toward goals    Frequency    Min 3X/week      PT Plan Current plan remains appropriate    Co-evaluation              AM-PAC PT "6 Clicks" Mobility   Outcome Measure  Help needed turning from your back to your side while in a flat bed without  using bedrails?: A Little Help needed moving from lying on your back to sitting on the side of a flat bed without using bedrails?: A Lot Help needed moving to and from a bed to a chair (including a wheelchair)?: A Little Help needed standing up from a chair using your arms (e.g., wheelchair or bedside chair)?: A Little Help needed to walk in hospital room?: A Lot Help needed climbing 3-5 steps with a railing? : Total 6 Click Score: 14    End of Session Equipment Utilized During Treatment: Gait belt Activity Tolerance: Other (comment)(limited by incontinence) Patient left: in chair;with call bell/phone within reach;with chair alarm set;with nursing/sitter in room Nurse Communication: Mobility status PT Visit Diagnosis: Other abnormalities of gait and mobility (R26.89);Muscle weakness (generalized) (M62.81)     Time: 9242-6834 PT Time Calculation (min) (ACUTE ONLY): 44 min  Charges:  $Therapeutic Activity: 38-52 mins                     [name], PT, DPT  Acute Rehabilitation Services  Pager: [phone] Office: [phone]    [name] 12/04/2018, 5:18 PM

## 2018-12-04 NOTE — Progress Notes (Signed)
PROGRESS NOTE    [name]  ACZ:[id number] DOB: 04-21-1921 DOA: 11/28/2018 PCP: Wenda Low, MD    Brief Narrative:  85 year old female with history of anemia, hypertension, hypothyroidism, GERD, hyperlipidemia, paroxysmal SVT presented on 11/28/2018 with generalized weakness, diarrhea and vomiting.  She was found to have acute kidney injury with creatinine of 3.25.  She was started on IV fluids.   Assessment & Plan:   Principal Problem:   AKI (acute kidney injury) ([place]) Active Problems:   Viral gastroenteritis   Generalized weakness   Chronic anemia   Thrombocytopenia (HCC)   E. coli UTI   UTI due to Klebsiella species   Abnormal magnetic resonance cholangiopancreatography (MRCP)   Hypophosphatemia   Hypokalemia   Proctitis   Hypomagnesemia  1 acute kidney injury Likely secondary to prerenal azotemia secondary to severe dehydration from GI losses of diarrhea in the setting of ACE inhibitor use. Patient noted to have presented with a creatinine of 3.25 on admission.  Baseline noted to be 1.1 on March 2018.  Function improved with hydration.  Creatinine at 1.08 today.  IV fluids have been saline locked.  Follow.  2.  Hypokalemia/hypomagnesemia/hypophosphatemia Likely secondary to GI losses.  Replete.  3.  E. coli/Klebsiella pneumonia UTI Continue IV Rocephin.  Could likely transition to oral antibiotics tomorrow.  Follow.  4.  Probable proctitis/colitis/??  Gastroenteritis GI PCR negative.  LFTs within normal limit.  Abdominal exam is benign.  Lipase noted to be elevated however not consistent with a clinical picture of acute pancreatitis.  CT abdomen and pelvis which was done showed a distended gallbladder with common bile duct approximately 14 mm without bile duct stone noted.  Right upper quadrant ultrasound showed a dilated common bile duct with intrahepatic duct concern for obstruction.  CT abdomen and pelvis which was done suggestive of proctitis/colitis with  diverticulosis without diverticulitis.  C. difficile PCR obtained negative.  Patient given a dose of Imodium 12/02/2018 with some improvement with diarrhea.  Gemfibrozil has been discontinued.  IV Flagyl has been discontinued.  MRCP obtained with mild common duct dependent stones, maximally 7 mm.  Equivocal distal common duct filling defect for which larger stone cannot be excluded.  Resultant intra-and extrahepatic biliary duct dilatation with gallbladder distention.  Small bilateral pleural effusions with bibasilar atelectasis.  Gastric underdistention with apparent wall thickening cannot exclude gastritis.  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  Patient on IV Rocephin.  Patient currently afebrile.  Due to abnormal MRCP will consult with GI for further evaluation and management.  Follow.  5.  Non-anion gap metabolic acidosis Secondary to diarrhea.  Improved.  Bicarb drip has been discontinued.  Chest x-ray negative for any acute infiltrates.  Unlikely patient has COVID-19.  Isolation has been discontinued.  PT recommending SNF placement.  Follow.  6.  Anemia of chronic disease H&H stable.  7.  Hypertension Continue metoprolol and home dose Cardizem.  Add low-dose hydralazine 25 mg twice daily.  8.  Peripheral vascular disease Continue Plavix.  9.  Hypothyroidism Continue Synthroid.  10.  Gastroesophageal reflux disease PPI.  11.  Depression Stable.  Continue home regimen Zoloft.    DVT prophylaxis: Heparin Code Status: DNR Family Communication: No family at bedside. Disposition Plan: To be determined.  Probable SNF   Consultants:   Gastroenterology pending    Procedures:   CT abdomen and pelvis 12/01/2018  Chest x-ray 11/28/2018  MRCP 12/03/2018  Renal ultrasound 11/28/2018  Right upper quadrant ultrasound 12/03/2018  Antimicrobials:   IV Rocephin 12/01/2018  IV Flagyl 12/02/2018>>>  12/03/2018   Subjective: Patient laying in bed.  Denies any chest pain or shortness of breath.  Denies any abdominal pain.  States loose stools may have improved.  Patient noted to have received a dose of Imodium on 12/02/2018.  Objective: Vitals:   12/04/18 0500 12/04/18 0555 12/04/18 0952 12/04/18 1412  BP:  (!) 182/69 (!) 179/72 (!) 147/50  Pulse:  66 66 (!) 55  Resp:   16 18  Temp:  98.7 F (37.1 C)  98.9 F (37.2 C)  TempSrc:  Oral  Oral  SpO2:  93% 96% 94%  Weight: 51 kg     Height:       No intake or output data in the 24 hours ending 12/04/18 1535 Filed Weights   11/30/18 0452 12/02/18 0500 12/04/18 0500  Weight: 51.3 kg 52.5 kg 51 kg    Examination:  General exam: Appears calm and comfortable  Respiratory system: Clear to auscultation. Respiratory effort normal. Cardiovascular system: S1 & S2 heard, RRR. No JVD, murmurs, rubs, gallops or clicks. No pedal edema. Gastrointestinal system: Abdomen is nondistended, soft and nontender. No organomegaly or masses felt. Normal bowel sounds heard. Central nervous system: Alert and oriented. No focal neurological deficits. Extremities: Symmetric 5 x 5 power. Skin: No rashes, lesions or ulcers Psychiatry: Judgement and insight appear normal. Mood & affect appropriate.     Data Reviewed: I have personally reviewed following labs and imaging studies  CBC: Recent Labs  Lab 11/30/18 0329 12/01/18 0825 12/02/18 0344 12/03/18 0350 12/04/18 0803  WBC 6.8 4.7 3.3* 3.9* 5.6  NEUTROABS 5.1 3.1 1.6* 2.0 3.5  HGB 10.8* 9.6* 8.8* 9.3* 10.2*  HCT 33.0* 29.8* 25.9* 28.9* 31.0*  MCV 93.5 89.2 87.8 89.2 89.6  PLT 132* 122* 105* 134* 027   Basic Metabolic Panel: Recent Labs  Lab 11/30/18 0329 12/01/18 0825 12/02/18 0344 12/03/18 0350 12/04/18 0803  NA 139 140 142 143 142  K 3.9 3.1* 2.6* 3.8 3.4*  CL 115* 112* 108 112* 107  CO2 12* 19* 24 22 21*  GLUCOSE 79 127* 114* 106* 91  BUN 72* 41* 28* 17 15  CREATININE 1.87*  1.21* 1.13* 1.07* 1.08*  CALCIUM 8.8* 8.3* 8.1* 7.9* 8.8*  MG 1.5* 1.9 1.7 1.6* 1.6*  PHOS  --   --   --   --  <1.0*   GFR: Estimated Creatinine Clearance: 21.3 mL/min (A) (by C-G formula based on SCr of 1.08 mg/dL (H)). Liver Function Tests: Recent Labs  Lab 11/28/18 2003 12/02/18 0344 12/04/18 0803  AST 19 16 19   ALT 9 8 11   ALKPHOS 76 61 66  BILITOT 0.7 0.6 0.7  PROT 6.8 4.9* 5.7*  ALBUMIN 3.6 2.4* 2.9*   Recent Labs  Lab 11/28/18 2145  LIPASE 80*   No results for input(s): AMMONIA in the last 168 hours. Coagulation Profile: No results for input(s): INR, PROTIME in the last 168 hours. Cardiac Enzymes: No results for input(s): CKTOTAL, CKMB, CKMBINDEX, TROPONINI in the last 168 hours. BNP (last 3 results) No results for input(s): PROBNP in the last 8760 hours. HbA1C: No results for input(s): HGBA1C in the last 72 hours. CBG: No results for input(s): GLUCAP in the last 168 hours. Lipid Profile: No results for input(s): CHOL, HDL, LDLCALC, TRIG, CHOLHDL, LDLDIRECT in the last 72 hours. Thyroid Function Tests: No results for input(s): TSH, T4TOTAL, FREET4, T3FREE, THYROIDAB in the last  72 hours. Anemia Panel: No results for input(s): VITAMINB12, FOLATE, FERRITIN, TIBC, IRON, RETICCTPCT in the last 72 hours. Sepsis Labs: Recent Labs  Lab 11/28/18 1928  LATICACIDVEN 0.9    Recent Results (from the past 240 hour(s))  Gastrointestinal Panel by PCR , Stool     Status: None   Collection Time: 11/28/18  9:48 PM  Result Value Ref Range Status   Campylobacter species NOT DETECTED NOT DETECTED Final   Plesimonas shigelloides NOT DETECTED NOT DETECTED Final   Salmonella species NOT DETECTED NOT DETECTED Final   Yersinia enterocolitica NOT DETECTED NOT DETECTED Final   Vibrio species NOT DETECTED NOT DETECTED Final   Vibrio cholerae NOT DETECTED NOT DETECTED Final   Enteroaggregative E coli (EAEC) NOT DETECTED NOT DETECTED Final   Enteropathogenic E coli (EPEC) NOT  DETECTED NOT DETECTED Final   Enterotoxigenic E coli (ETEC) NOT DETECTED NOT DETECTED Final   Shiga like toxin producing E coli (STEC) NOT DETECTED NOT DETECTED Final   Shigella/Enteroinvasive E coli (EIEC) NOT DETECTED NOT DETECTED Final   Cryptosporidium NOT DETECTED NOT DETECTED Final   Cyclospora cayetanensis NOT DETECTED NOT DETECTED Final   Entamoeba histolytica NOT DETECTED NOT DETECTED Final   Giardia lamblia NOT DETECTED NOT DETECTED Final   Adenovirus F40/41 NOT DETECTED NOT DETECTED Final   Astrovirus NOT DETECTED NOT DETECTED Final   Norovirus GI/GII NOT DETECTED NOT DETECTED Final   Rotavirus A NOT DETECTED NOT DETECTED Final   Sapovirus (I, II, IV, and V) NOT DETECTED NOT DETECTED Final    Comment: Performed at [facility], [address] [place], [place] [address]  Culture, Urine     Status: Abnormal   Collection Time: 12/01/18  9:12 AM  Result Value Ref Range Status   Specimen Description URINE, CLEAN CATCH  Final   Special Requests   Final    NONE Performed at [facility], [address], [place],  [address]    Culture (A)  Final    >=100,000 COLONIES/mL ESCHERICHIA COLI 50,000 COLONIES/mL KLEBSIELLA PNEUMONIAE    Report Status 12/04/2018 FINAL  Final   Organism ID, Bacteria ESCHERICHIA COLI (A)  Final   Organism ID, Bacteria KLEBSIELLA PNEUMONIAE (A)  Final      Susceptibility   Escherichia coli - MIC*    AMPICILLIN 8 SENSITIVE Sensitive     CEFAZOLIN <=4 SENSITIVE Sensitive     CEFTRIAXONE <=1 SENSITIVE Sensitive     CIPROFLOXACIN <=0.25 SENSITIVE Sensitive     GENTAMICIN <=1 SENSITIVE Sensitive     IMIPENEM <=0.25 SENSITIVE Sensitive     NITROFURANTOIN <=16 SENSITIVE Sensitive     TRIMETH/SULFA <=20 SENSITIVE Sensitive     AMPICILLIN/SULBACTAM 4 SENSITIVE Sensitive     PIP/TAZO <=4 SENSITIVE Sensitive     Extended ESBL NEGATIVE Sensitive     * >=100,000 COLONIES/mL ESCHERICHIA COLI   Klebsiella pneumoniae - MIC*    AMPICILLIN  >=32 RESISTANT Resistant     CEFAZOLIN <=4 SENSITIVE Sensitive     CEFTRIAXONE <=1 SENSITIVE Sensitive     CIPROFLOXACIN <=0.25 SENSITIVE Sensitive     GENTAMICIN <=1 SENSITIVE Sensitive     IMIPENEM <=0.25 SENSITIVE Sensitive     NITROFURANTOIN 64 INTERMEDIATE Intermediate     TRIMETH/SULFA <=20 SENSITIVE Sensitive     AMPICILLIN/SULBACTAM 16 INTERMEDIATE Intermediate     PIP/TAZO 16 SENSITIVE Sensitive     Extended ESBL NEGATIVE Sensitive     * 50,000 COLONIES/mL KLEBSIELLA PNEUMONIAE  C difficile quick scan w PCR  reflex     Status: None   Collection Time: 12/04/18  7:00 AM  Result Value Ref Range Status   C Diff antigen NEGATIVE NEGATIVE Final   C Diff toxin NEGATIVE NEGATIVE Final   C Diff interpretation No C. difficile detected.  Final    Comment: Performed at [facility], [place] [address], [place],  [address]         Radiology Studies: Mr 3d Recon At Scanner  Result Date: 12/03/2018 CLINICAL DATA:  Common bile duct obstruction.  Upper abdominal pain. EXAM: MRI ABDOMEN WITHOUT AND WITH CONTRAST (INCLUDING MRCP) TECHNIQUE: Multiplanar multisequence MR imaging of the abdomen was performed both before and after the administration of intravenous contrast. Heavily T2-weighted images of the biliary and pancreatic ducts were obtained, and three-dimensional MRCP images were rendered by post processing. CONTRAST:  6 cc of Gadavist COMPARISON:  414 ultrasound.  12/01/2018 CT. FINDINGS: Mild to moderate motion degradation throughout. Lower chest: Small bilateral pleural effusions with bibasilar atelectasis. Mild cardiomegaly. Hepatobiliary: No suspicious liver lesion. Scattered hepatic cysts or bile duct hamartomas. Gallbladder distension, without stone or acute cholecystitis. Intrahepatic duct dilatation is again identified. The common duct is dilated including at 1.3 cm in the porta hepatis on image 38/4. There are foci of dependent abnormal signal within the mid common duct,  including at 7 mm on image 42/14. At the level of the ampulla, there is an equivocal filling defect which is most suspicious on coronal reformats, including on image 8/8. Pancreas: Atrophy and prominence of the pancreatic duct/side branches, likely within normal variation for age. No acute inflammation. Spleen: Multi septated cystic lesion within the medial spleen measures 4.3 x 3.8 cm. Adrenals/Urinary Tract: Normal adrenal glands. Bilateral renal lesions, primarily felt to represent cysts. Some demonstrate precontrast T1 hyperintensity on series 14, favoring minimally complex cysts. No hydronephrosis. Stomach/Bowel: The stomach is underdistended but appears thick walled, including on series 14. Normal abdominal small bowel loops. Colonic diverticula. Vascular/Lymphatic: Advanced aortic atherosclerosis with ulcerative plaque throughout. No aneurysm. No abdominal adenopathy. Other:  Fluid in the pelvis, suboptimally and incompletely imaged. Musculoskeletal: No acute osseous abnormality. IMPRESSION: 1. Mild to moderate motion degradation. 2. Mid common duct dependent stones, maximally 7 mm. Equivocal distal common duct filling defect for which a larger stone cannot be excluded. Resultant intra and extrahepatic biliary duct dilatation with gallbladder distension. 3. Small bilateral pleural effusions with bibasilar atelectasis. 4. Gastric underdistention with apparent wall thickening. Cannot exclude gastritis. 5.  Aortic Atherosclerosis (ICD10-I70.0). 6. 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 Electronically Signed   By: Abigail Miyamoto M.D.   On: 12/03/2018 16:49   Mr Abdomen Mrcp Moise Boring Contast  Result Date: 12/03/2018 CLINICAL DATA:  Common bile duct obstruction.  Upper abdominal pain. EXAM: MRI ABDOMEN WITHOUT AND WITH CONTRAST (INCLUDING MRCP) TECHNIQUE: Multiplanar multisequence MR imaging of the abdomen was performed both before and after  the administration of intravenous contrast. Heavily T2-weighted images of the biliary and pancreatic ducts were obtained, and three-dimensional MRCP images were rendered by post processing. CONTRAST:  6 cc of Gadavist COMPARISON:  414 ultrasound.  12/01/2018 CT. FINDINGS: Mild to moderate motion degradation throughout. Lower chest: Small bilateral pleural effusions with bibasilar atelectasis. Mild cardiomegaly. Hepatobiliary: No suspicious liver lesion. Scattered hepatic cysts or bile duct hamartomas. Gallbladder distension, without stone or acute cholecystitis. Intrahepatic duct dilatation is again identified. The common duct is dilated including at 1.3 cm in the porta  hepatis on image 38/4. There are foci of dependent abnormal signal within the mid common duct, including at 7 mm on image 42/14. At the level of the ampulla, there is an equivocal filling defect which is most suspicious on coronal reformats, including on image 8/8. Pancreas: Atrophy and prominence of the pancreatic duct/side branches, likely within normal variation for age. No acute inflammation. Spleen: Multi septated cystic lesion within the medial spleen measures 4.3 x 3.8 cm. Adrenals/Urinary Tract: Normal adrenal glands. Bilateral renal lesions, primarily felt to represent cysts. Some demonstrate precontrast T1 hyperintensity on series 14, favoring minimally complex cysts. No hydronephrosis. Stomach/Bowel: The stomach is underdistended but appears thick walled, including on series 14. Normal abdominal small bowel loops. Colonic diverticula. Vascular/Lymphatic: Advanced aortic atherosclerosis with ulcerative plaque throughout. No aneurysm. No abdominal adenopathy. Other:  Fluid in the pelvis, suboptimally and incompletely imaged. Musculoskeletal: No acute osseous abnormality. IMPRESSION: 1. Mild to moderate motion degradation. 2. Mid common duct dependent stones, maximally 7 mm. Equivocal distal common duct filling defect for which a larger stone  cannot be excluded. Resultant intra and extrahepatic biliary duct dilatation with gallbladder distension. 3. Small bilateral pleural effusions with bibasilar atelectasis. 4. Gastric underdistention with apparent wall thickening. Cannot exclude gastritis. 5.  Aortic Atherosclerosis (ICD10-I70.0). 6. 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 Electronically Signed   By: Abigail Miyamoto M.D.   On: 12/03/2018 16:49   US Abdomen Limited Ruq  Result Date: 12/03/2018 CLINICAL DATA:  Dilated common bile duct and common bile duct. EXAM: ULTRASOUND ABDOMEN LIMITED RIGHT UPPER QUADRANT COMPARISON:  CT 12/01/2018 FINDINGS: Gallbladder: No gallstones or wall thickening visualized. There is a small amount of sludge in the gallbladder. The gallbladder is distended with a diameter of 5.2 cm. Common bile duct: Diameter: 13 mm. Liver: No focal lesion identified. Within normal limits in parenchymal echogenicity. Biliary dilatation within the hepatic parenchyma is noted. Portal vein is patent on color Doppler imaging with normal direction of blood flow towards the liver. Additional findings: A right pleural effusion is noted. IMPRESSION: Stable gallbladder dilatation. Common bile duct is dilated. Intrahepatic biliary dilatation is also noted. Findings suggest common bile duct obstruction. MRCP may be helpful. Incidental right pleural effusion. Electronically Signed   By: Marybelle Killings M.D.   On: 12/03/2018 07:46        Scheduled Meds:  brimonidine  1 drop Left Eye TID   clopidogrel  75 mg Oral QODAY   diltiazem  120 mg Oral Daily   dorzolamide-timolol  1 drop Left Eye BID   feeding supplement (ENSURE ENLIVE)  237 mL Oral BID BM   ferrous sulfate  325 mg Oral QPM   heparin  5,000 Units Subcutaneous Q8H   hydrALAZINE  25 mg Oral Q8H   latanoprost  1 drop Both Eyes QHS   levothyroxine  25 mcg Oral QODAY   levothyroxine  50 mcg Oral QODAY    metoprolol tartrate  50 mg Oral BID   metroNIDAZOLE  500 mg Oral Q8H   pantoprazole  40 mg Oral BID AC   sertraline  100 mg Oral QHS   Continuous Infusions:  cefTRIAXone (ROCEPHIN)  IV 1 g (12/04/18 1007)   potassium PHOSPHATE IVPB (mEq) Stopped (12/04/18 1143)     LOS: 5 days    Time spent: 40 minutes    Irine Seal, MD [facility]  If 7PM-7AM, please contact night-coverage [email or web address] 12/04/2018, 3:35 PM

## 2018-12-04 NOTE — Progress Notes (Signed)
SLP Cancellation Note  Patient Details Name: MARILENE VATH MRN: [id number] DOB: [birth year]   Cancelled treatment:       Reason Eval/Treat Not Completed: Other (comment) Pt made NPO by MD this morning. Note that GI w/u is underway. Will f/u pending clarification that we can resume POs.   Venita Sheffield Roxanne Panek 12/04/2018, 10:22 AM  Pollyann Glen, M.A. [place] [facility] Environmental education officer [phone] Office [phone]

## 2018-12-04 NOTE — Consult Note (Signed)
Referring Provider: [facility]  Primary Care Physician:  Wenda Low, MD Primary Gastroenterologist: unassigned     Reason for Consultation:    Diarrhea, vomiting, abnormal imaging    ASSESSMENT / PLAN:     104.  85 year old female admitted a couple days ago with vomiting, diarrhea.  She has associated electrolyte abnormalities and AKI, probably from volume depletion.  Etiology of vomiting / diarrhea unclear. CT scan reveals "questionable" wall thickening of rectosigmoid colon.   -On Flagyl. GI path panel and C-diff both negative. -Diarrhea seems to be improving, she had only one dose of Imodium and that was 2 days ago. Of note, several constipation medications on home med list so obviously has chronic constipation. She also has imodium on home med list. If recently constipated the "diarrhea" could be overflow and "proctitis" on imaging the result of recent impaction. However, wouldn't explain "possible' wall thickening of sigmoid colon.  -if doesn't continue to improve will consider flex sigmoidoscopy.   2. Choledocholithiasis. There is associated intra/extrahepatic duct dilation on MRCP. Liver tests normal.  Denies abdominal pain.  Certainly no evidence for cholangitis.  -May need eventual ERCP, her advanced age increases risk of procedure.  3. Chronic [place] anemia on asa / plavix. On iron at home. Denies overt GI bleeding.   Her hgb is 10.2, down from baseline of 11-12.  -monitor for now. Given age would hold off on endoscopic evaluation unless overtly bleeding of hgb continues to decline  4. GERD, on daily PPI  5. UTI, on Rocephin  6. Hypothyroidism, on replacement    HPI:     [name] is a 85 y.o. female with past medical history not limited to hypertension, paroxysmal SVT, peripheral vascular disease, hypothyroidism, and anemia for which she is on iron at home.  She is on chronic Plavix. Patient presented to ED 11/28/18 with diarrhea, vomiting and weakness. She  is not a great historian. Says the diarrhea started a few days prior to admission. Diarrhea soon followed by N/V. She was admitted with AKI,  UTI, electrolytes abnormalities. Patient believes GI symptoms started a few days prior to admission. Doesn't think she had any recent antibiotics. C-diff has just resulted negative and GI path panel is negative. WBC has been normal . Tmax 99.2 . Her hgb has been in 9-10 range, down from baseline of 11-12 range. She denies overt GI bleeding.   Non-contrast CTAP raises concern for proctosigmoiditis. Also revealing on biliary duct dilation. MRCP reveals CBD stone. Her liver tests are normal and no abdominal pain.     MRCP IMPRESSION: 1. Mild to moderate motion degradation. 2. Mid common duct dependent stones, maximally 7 mm. Equivocal distal common duct filling defect for which a larger stone cannot be excluded. Resultant intra and extrahepatic biliary duct dilatation with gallbladder distension. 3. Small bilateral pleural effusions with bibasilar atelectasis. 4. Gastric underdistention with apparent wall thickening. Cannot exclude gastritis. 5.  Aortic Atherosclerosis (ICD10-I70.0). 6. 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     Past Medical History:  Diagnosis Date   Anemia    Anginal pain ([place])    Arthritis    "all over; doesn't seem to bother me much" (08/22/2013)   Chronic lower back pain    Dysrhythmia    "irregular heart beat"    Exertional shortness of breath    GERD (gastroesophageal reflux disease)    Hypertension    Hypothyroidism    Migraines    "  used to"   Mixed hyperlipidemia    Osteoporosis    Paroxysmal supraventricular tachycardia ([facility])    Skin cancer    "couple taken off right leg" (08/22/2013)    Past Surgical History:  Procedure Laterality Date   ABDOMINAL HYSTERECTOMY     ANTERIOR APPROACH HEMI HIP ARTHROPLASTY Left 10/18/2016    Procedure: ANTERIOR APPROACH LEFT HEMI HIP ARTHROPLASTY;  Surgeon: Leandrew Koyanagi, MD;  Location: [facility];  Service: Orthopedics;  Laterality: Left;   APPENDECTOMY  08/22/2013   BACK SURGERY     CATARACT EXTRACTION W/ INTRAOCULAR LENS  IMPLANT, BILATERAL     LAPAROSCOPIC APPENDECTOMY N/A 08/22/2013   Procedure: APPENDECTOMY LAPAROSCOPIC;  Surgeon: Imogene Burn. [name], MD;  Location: [facility];  Service: General;  Laterality: N/A;   LUMBAR [place] SURGERY  2008   THROAT SURGERY  1940's; ?   "cysts removed"     Prior to Admission medications   Medication Sig Start Date End Date Taking? Authorizing Provider  acetaminophen (TYLENOL) 500 MG tablet Take 2 tablets (1,000 mg total) by mouth 3 (three) times daily. X 3 DAYS Patient taking differently: Take 1,000 mg by mouth at bedtime.  10/20/16  Yes Rai, Ripudeep K, MD  bimatoprost (LUMIGAN) 0.01 % SOLN Place 1 drop into both eyes at bedtime.   Yes [provider]  brimonidine (ALPHAGAN) 0.15 % ophthalmic solution Place 1 drop into the left eye 3 (three) times daily.   Yes [provider]  Calcium Carb-Cholecalciferol (CALCIUM 600+D3 PO) Take 1 tablet by mouth 2 (two) times daily.    Yes [provider]  cloNIDine (CATAPRES) 0.1 MG tablet Take 0.1 mg by mouth 2 (two) times daily as needed (only if systolic number is 824 or greater).    Yes [provider]  clopidogrel (PLAVIX) 75 MG tablet Take 75 mg by mouth every other day.  09/26/18  Yes [provider]  diltiazem (CARDIZEM CD) 120 MG 24 hr capsule Take 120 mg by mouth at bedtime.  08/31/18  Yes [provider]  dorzolamide-timolol (COSOPT) 22.3-6.8 MG/ML ophthalmic solution Place 1 drop into the left eye 2 (two) times daily. 05/07/14  Yes [provider]  feeding supplement, ENSURE ENLIVE, (ENSURE ENLIVE) LIQD Take 237 mLs by mouth 2 (two) times daily between meals. 10/20/16  Yes Rai, Ripudeep K, MD  ferrous sulfate 325 (65 FE) MG EC tablet Take 325 mg by  mouth every evening.   Yes [provider]  fexofenadine (ALLEGRA) 180 MG tablet Take 180 mg by mouth daily.   Yes [provider]  gemfibrozil (LOPID) 600 MG tablet Take 600 mg by mouth 2 (two) times daily before a meal.   Yes [provider]  levothyroxine (SYNTHROID, LEVOTHROID) 50 MCG tablet Take 25-50 mcg by mouth See admin instructions. Take 25 mcg by mouth in the morning before breakfast, alternating with 50 mcg (every other morning)   Yes [provider]  lisinopril (PRINIVIL,ZESTRIL) 20 MG tablet Take 20 mg by mouth daily.   Yes [provider]  loperamide (IMODIUM A-D) 2 MG tablet Take 2 mg by mouth at bedtime.   Yes [provider]  loperamide (IMODIUM) 2 MG capsule Take 2-4 mg by mouth as needed for diarrhea or loose stools.    Yes [provider]  metoprolol (LOPRESSOR) 50 MG tablet Take 50 mg by mouth 2 (two) times daily.   Yes [provider]  Omega-3 Fatty Acids (FISH OIL) 1000 MG CAPS Take 1,000 mg by  mouth 2 (two) times daily.    Yes [provider]  omeprazole (PRILOSEC) 20 MG capsule Take 20 mg by mouth daily.   Yes [provider]  sertraline (ZOLOFT) 100 MG tablet Take 100 mg by mouth at bedtime.    Yes [provider]  BIOTIN PO Take 1 tablet by mouth daily.    [provider]  bisacodyl (DULCOLAX) 10 MG suppository Place 1 suppository (10 mg total) rectally daily as needed for moderate constipation. Patient not taking: Reported on 11/28/2018 10/20/16   Rai, Vernelle Emerald, MD  polyethylene glycol (MIRALAX / GLYCOLAX) packet Take 17 g by mouth daily. Patient not taking: Reported on 11/28/2018 10/21/16   Rai, Vernelle Emerald, MD  senna-docusate (SENOKOT-S) 8.6-50 MG tablet Take 1 tablet by mouth 2 (two) times daily. Patient not taking: Reported on 11/28/2018 10/20/16   Mendel Corning, MD    Current Facility-Administered Medications  Medication Dose Route Frequency Provider Last Rate Last  Dose   acetaminophen (TYLENOL) tablet 650 mg  650 mg Oral Q6H PRN Shela Leff, MD       Or   acetaminophen (TYLENOL) suppository 650 mg  650 mg Rectal Q6H PRN Shela Leff, MD       alum & mag hydroxide-simeth (MAALOX/MYLANTA) 200-200-20 MG/5ML suspension 15 mL  15 mL Oral Q4H PRN Starla Link, Kshitiz, MD       brimonidine (ALPHAGAN) 0.15 % ophthalmic solution 1 drop  1 drop Left Eye TID Shela Leff, MD   1 drop at 12/04/18 0955   cefTRIAXone (ROCEPHIN) 1 g in sodium chloride 0.9 % 100 mL IVPB  1 g Intravenous Q24H Alekh, Kshitiz, MD 200 mL/hr at 12/03/18 1127 1 g at 12/03/18 1127   clopidogrel (PLAVIX) tablet 75 mg  75 mg Oral Derrek Gu, MD   75 mg at 12/04/18 0954   diltiazem (CARDIZEM CD) 24 hr capsule 120 mg  120 mg Oral Daily Alekh, Kshitiz, MD   120 mg at 12/04/18 0953   dorzolamide-timolol (COSOPT) 22.3-6.8 MG/ML ophthalmic solution 1 drop  1 drop Left Eye BID Shela Leff, MD   1 drop at 12/04/18 0955   feeding supplement (ENSURE ENLIVE) (ENSURE ENLIVE) liquid 237 mL  237 mL Oral BID BM Shela Leff, MD   237 mL at 12/03/18 1621   ferrous sulfate tablet 325 mg  325 mg Oral QPM Shela Leff, MD   325 mg at 12/03/18 1848   heparin injection 5,000 Units  5,000 Units Subcutaneous Quay Burow, MD   5,000 Units at 12/04/18 0650   hydrALAZINE (APRESOLINE) injection 5 mg  5 mg Intravenous Q6H PRN Aline August, MD       hydrALAZINE (APRESOLINE) tablet 25 mg  25 mg Oral Q8H Eugenie Filler, MD       latanoprost (XALATAN) 0.005 % ophthalmic solution 1 drop  1 drop Both Eyes QHS Shela Leff, MD   1 drop at 12/03/18 2330   levothyroxine (SYNTHROID, LEVOTHROID) tablet 25 mcg  25 mcg Oral Derrek Gu, MD   25 mcg at 12/03/18 0550   levothyroxine (SYNTHROID, LEVOTHROID) tablet 50 mcg  50 mcg Oral Derrek Gu, MD   50 mcg at 12/04/18 0955   lip balm (CARMEX) ointment 1 application  1 application  Topical PRN Aline August, MD       loperamide (IMODIUM) capsule 2 mg  2 mg Oral PRN Shela Leff, MD   2 mg at 12/02/18 2212   loratadine (CLARITIN) tablet 10 mg  10 mg Oral Daily PRN Shela Leff, MD       magnesium sulfate IVPB 4 g 100 mL  4 g Intravenous Once Eugenie Filler, MD       metoprolol tartrate (LOPRESSOR) tablet 50 mg  50 mg Oral BID Shela Leff, MD   50 mg at 12/04/18 0953   metroNIDAZOLE (FLAGYL) tablet 500 mg  500 mg Oral Q8H Alekh, Kshitiz, MD   500 mg at 12/04/18 0649   ondansetron (ZOFRAN) injection 4 mg  4 mg Intravenous Q6H PRN Shela Leff, MD       pantoprazole (PROTONIX) EC tablet 40 mg  40 mg Oral BID AC Alekh, Kshitiz, MD   40 mg at 12/04/18 0954   potassium chloride SA (K-DUR,KLOR-CON) CR tablet 40 mEq  40 mEq Oral Once Eugenie Filler, MD       potassium PHOSPHATE 40 mEq in dextrose 5 % 500 mL infusion  40 mEq Intravenous Once Eugenie Filler, MD       sertraline (ZOLOFT) tablet 100 mg  100 mg Oral QHS Shela Leff, MD   100 mg at 12/03/18 2328    Allergies as of 11/28/2018 - Review Complete 11/28/2018  Allergen Reaction Noted   Codeine Other (See Comments) 04/30/2014   Gabapentin Other (See Comments) 11/28/2018   Other Other (See Comments) 10/16/2016    History reviewed. No pertinent family history.  Social History   Socioeconomic History   Marital status: Widowed    Spouse name: Not on file   Number of children: Not on file   Years of education: Not on file   Highest education level: Not on file  Occupational History   Not on file  Social Needs   Financial resource strain: Not on file   Food insecurity:    Worry: Not on file    Inability: Not on file   Transportation needs:    Medical: Not on file    Non-medical: Not on file  Tobacco Use   Smoking status: Never Smoker   Smokeless tobacco: Never Used  Substance and Sexual Activity   Alcohol use: No   Drug use: No   Sexual  activity: Never  Lifestyle   Physical activity:    Days per week: Not on file    Minutes per session: Not on file   Stress: Not on file  Relationships   Social connections:    Talks on phone: Not on file    Gets together: Not on file    Attends religious service: Not on file    Active member of club or organization: Not on file    Attends meetings of clubs or organizations: Not on file    Relationship status: Not on file   Intimate partner violence:    Fear of current or ex partner: Not on file    Emotionally abused: Not on file    Physically abused: Not on file    Forced sexual activity: Not on file  Other Topics Concern   Not on file  Social History Narrative   Not on file    Review of Systems: All systems reviewed and negative except where noted in HPI.  Physical Exam: Vital signs in last 24 hours: Temp:  [98.7 F (37.1 C)-99.2 F (37.3 C)] 98.7 F (37.1 C) (04/15 0555) Pulse Rate:  [66-73] 66 (04/15 0952) Resp:  [16-18] 16 (04/15 0952) BP: (154-182)/(59-72) 179/72 (04/15 0952) SpO2:  [93 %-96 %] 96 % (04/15 0952) Weight:  [51 kg] 51  kg (04/15 0500) Last BM Date: 12/03/18(rectal pouch) General:   Alert, thin female in NAD Psych:  Pleasant, cooperative. Normal mood and affect. Eyes:  Pupils equal, sclera clear, no icterus.   Conjunctiva pink. Ears:  Normal auditory acuity. Nose:  No deformity, discharge,  or lesions. Neck:  Supple; no masses Lungs:  Bilateral crackles. Breath sounds diminished bilaterally  Heart:  Regular rate, irreg rhythm, no lower extremity edema Abdomen:  Soft, non-distended, nontender, BS active, no palp mass    Rectal:  Deferred  Msk:  Symmetrical without gross deformities. . Neurologic:  Alert and  oriented x4;  grossly normal neurologically. Skin:  Intact without significant lesions or rashes.   Intake/Output from previous day: 04/14 0701 - 04/15 0700 In: -  Out: 300 [Urine:300] Intake/Output this shift: No intake/output  data recorded.  Lab Results: Recent Labs    12/02/18 0344 12/03/18 0350 12/04/18 0803  WBC 3.3* 3.9* 5.6  HGB 8.8* 9.3* 10.2*  HCT 25.9* 28.9* 31.0*  PLT 105* 134* 183   BMET Recent Labs    12/02/18 0344 12/03/18 0350 12/04/18 0803  NA 142 143 142  K 2.6* 3.8 3.4*  CL 108 112* 107  CO2 24 22 21*  GLUCOSE 114* 106* 91  BUN 28* 17 15  CREATININE 1.13* 1.07* 1.08*  CALCIUM 8.1* 7.9* 8.8*   LFT Recent Labs    12/04/18 0803  PROT 5.7*  ALBUMIN 2.9*  AST 19  ALT 11  ALKPHOS 66  BILITOT 0.7   PT/INR No results for input(s): LABPROT, INR in the last 72 hours. Hepatitis Panel No results for input(s): HEPBSAG, HCVAB, HEPAIGM, HEPBIGM in the last 72 hours.    Studies/Results: Mr 3d Recon At Scanner  Result Date: 12/03/2018 CLINICAL DATA:  Common bile duct obstruction.  Upper abdominal pain. EXAM: MRI ABDOMEN WITHOUT AND WITH CONTRAST (INCLUDING MRCP) TECHNIQUE: Multiplanar multisequence MR imaging of the abdomen was performed both before and after the administration of intravenous contrast. Heavily T2-weighted images of the biliary and pancreatic ducts were obtained, and three-dimensional MRCP images were rendered by post processing. CONTRAST:  6 cc of Gadavist COMPARISON:  414 ultrasound.  12/01/2018 CT. FINDINGS: Mild to moderate motion degradation throughout. Lower chest: Small bilateral pleural effusions with bibasilar atelectasis. Mild cardiomegaly. Hepatobiliary: No suspicious liver lesion. Scattered hepatic cysts or bile duct hamartomas. Gallbladder distension, without stone or acute cholecystitis. Intrahepatic duct dilatation is again identified. The common duct is dilated including at 1.3 cm in the porta hepatis on image 38/4. There are foci of dependent abnormal signal within the mid common duct, including at 7 mm on image 42/14. At the level of the ampulla, there is an equivocal filling defect which is most suspicious on coronal reformats, including on image 8/8.  Pancreas: Atrophy and prominence of the pancreatic duct/side branches, likely within normal variation for age. No acute inflammation. Spleen: Multi septated cystic lesion within the medial spleen measures 4.3 x 3.8 cm. Adrenals/Urinary Tract: Normal adrenal glands. Bilateral renal lesions, primarily felt to represent cysts. Some demonstrate precontrast T1 hyperintensity on series 14, favoring minimally complex cysts. No hydronephrosis. Stomach/Bowel: The stomach is underdistended but appears thick walled, including on series 14. Normal abdominal small bowel loops. Colonic diverticula. Vascular/Lymphatic: Advanced aortic atherosclerosis with ulcerative plaque throughout. No aneurysm. No abdominal adenopathy. Other:  Fluid in the pelvis, suboptimally and incompletely imaged. Musculoskeletal: No acute osseous abnormality. IMPRESSION: 1. Mild to moderate motion degradation. 2. Mid common duct dependent stones, maximally 7 mm. Equivocal distal common duct  filling defect for which a larger stone cannot be excluded. Resultant intra and extrahepatic biliary duct dilatation with gallbladder distension. 3. Small bilateral pleural effusions with bibasilar atelectasis. 4. Gastric underdistention with apparent wall thickening. Cannot exclude gastritis. 5.  Aortic Atherosclerosis (ICD10-I70.0). 6. 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 Electronically Signed   By: Abigail Miyamoto M.D.   On: 12/03/2018 16:49   Mr Abdomen Mrcp Moise Boring Contast  Result Date: 12/03/2018 CLINICAL DATA:  Common bile duct obstruction.  Upper abdominal pain. EXAM: MRI ABDOMEN WITHOUT AND WITH CONTRAST (INCLUDING MRCP) TECHNIQUE: Multiplanar multisequence MR imaging of the abdomen was performed both before and after the administration of intravenous contrast. Heavily T2-weighted images of the biliary and pancreatic ducts were obtained, and three-dimensional MRCP images were rendered  by post processing. CONTRAST:  6 cc of Gadavist COMPARISON:  414 ultrasound.  12/01/2018 CT. FINDINGS: Mild to moderate motion degradation throughout. Lower chest: Small bilateral pleural effusions with bibasilar atelectasis. Mild cardiomegaly. Hepatobiliary: No suspicious liver lesion. Scattered hepatic cysts or bile duct hamartomas. Gallbladder distension, without stone or acute cholecystitis. Intrahepatic duct dilatation is again identified. The common duct is dilated including at 1.3 cm in the porta hepatis on image 38/4. There are foci of dependent abnormal signal within the mid common duct, including at 7 mm on image 42/14. At the level of the ampulla, there is an equivocal filling defect which is most suspicious on coronal reformats, including on image 8/8. Pancreas: Atrophy and prominence of the pancreatic duct/side branches, likely within normal variation for age. No acute inflammation. Spleen: Multi septated cystic lesion within the medial spleen measures 4.3 x 3.8 cm. Adrenals/Urinary Tract: Normal adrenal glands. Bilateral renal lesions, primarily felt to represent cysts. Some demonstrate precontrast T1 hyperintensity on series 14, favoring minimally complex cysts. No hydronephrosis. Stomach/Bowel: The stomach is underdistended but appears thick walled, including on series 14. Normal abdominal small bowel loops. Colonic diverticula. Vascular/Lymphatic: Advanced aortic atherosclerosis with ulcerative plaque throughout. No aneurysm. No abdominal adenopathy. Other:  Fluid in the pelvis, suboptimally and incompletely imaged. Musculoskeletal: No acute osseous abnormality. IMPRESSION: 1. Mild to moderate motion degradation. 2. Mid common duct dependent stones, maximally 7 mm. Equivocal distal common duct filling defect for which a larger stone cannot be excluded. Resultant intra and extrahepatic biliary duct dilatation with gallbladder distension. 3. Small bilateral pleural effusions with bibasilar  atelectasis. 4. Gastric underdistention with apparent wall thickening. Cannot exclude gastritis. 5.  Aortic Atherosclerosis (ICD10-I70.0). 6. 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 Electronically Signed   By: Abigail Miyamoto M.D.   On: 12/03/2018 16:49   US Abdomen Limited Ruq  Result Date: 12/03/2018 CLINICAL DATA:  Dilated common bile duct and common bile duct. EXAM: ULTRASOUND ABDOMEN LIMITED RIGHT UPPER QUADRANT COMPARISON:  CT 12/01/2018 FINDINGS: Gallbladder: No gallstones or wall thickening visualized. There is a small amount of sludge in the gallbladder. The gallbladder is distended with a diameter of 5.2 cm. Common bile duct: Diameter: 13 mm. Liver: No focal lesion identified. Within normal limits in parenchymal echogenicity. Biliary dilatation within the hepatic parenchyma is noted. Portal vein is patent on color Doppler imaging with normal direction of blood flow towards the liver. Additional findings: A right pleural effusion is noted. IMPRESSION: Stable gallbladder dilatation. Common bile duct is dilated. Intrahepatic biliary dilatation is also noted. Findings suggest common bile duct obstruction. MRCP may be helpful. Incidental  right pleural effusion. Electronically Signed   By: Marybelle Killings M.D.   On: 12/03/2018 07:46     Tye Savoy, NP-C @  12/04/2018, 9:55 AM

## 2018-12-05 LAB — CBC WITH DIFFERENTIAL/PLATELET
Abs Immature Granulocytes: 0.05 10*3/uL (ref 0.00–0.07)
Basophils Absolute: 0 10*3/uL (ref 0.0–0.1)
Basophils Relative: 0 %
Eosinophils Absolute: 0.3 10*3/uL (ref 0.0–0.5)
Eosinophils Relative: 5 %
HCT: 29.3 % — ABNORMAL LOW (ref 36.0–46.0)
Hemoglobin: 9.3 g/dL — ABNORMAL LOW (ref 12.0–15.0)
Immature Granulocytes: 1 %
Lymphocytes Relative: 25 %
Lymphs Abs: 1.3 10*3/uL (ref 0.7–4.0)
MCH: 28.6 pg (ref 26.0–34.0)
MCHC: 31.7 g/dL (ref 30.0–36.0)
MCV: 90.2 fL (ref 80.0–100.0)
Monocytes Absolute: 0.6 10*3/uL (ref 0.1–1.0)
Monocytes Relative: 11 %
Neutro Abs: 3.1 10*3/uL (ref 1.7–7.7)
Neutrophils Relative %: 58 %
Platelets: 197 10*3/uL (ref 150–400)
RBC: 3.25 MIL/uL — ABNORMAL LOW (ref 3.87–5.11)
RDW: 17 % — ABNORMAL HIGH (ref 11.5–15.5)
WBC: 5.4 10*3/uL (ref 4.0–10.5)
nRBC: 0 % (ref 0.0–0.2)

## 2018-12-05 LAB — COMPREHENSIVE METABOLIC PANEL
ALT: 11 U/L (ref 0–44)
AST: 20 U/L (ref 15–41)
Albumin: 2.7 g/dL — ABNORMAL LOW (ref 3.5–5.0)
Alkaline Phosphatase: 56 U/L (ref 38–126)
Anion gap: 10 (ref 5–15)
BUN: 15 mg/dL (ref 8–23)
CO2: 18 mmol/L — ABNORMAL LOW (ref 22–32)
Calcium: 8.4 mg/dL — ABNORMAL LOW (ref 8.9–10.3)
Chloride: 108 mmol/L (ref 98–111)
Creatinine, Ser: 1.03 mg/dL — ABNORMAL HIGH (ref 0.44–1.00)
GFR calc Af Amer: 54 mL/min — ABNORMAL LOW (ref >60)
GFR calc non Af Amer: 46 mL/min — ABNORMAL LOW (ref >60)
Glucose, Bld: 98 mg/dL (ref 70–99)
Potassium: 4 mmol/L (ref 3.5–5.1)
Sodium: 136 mmol/L (ref 135–145)
Total Bilirubin: 0.6 mg/dL (ref 0.3–1.2)
Total Protein: 5.2 g/dL — ABNORMAL LOW (ref 6.5–8.1)

## 2018-12-05 LAB — MAGNESIUM: Magnesium: 2.4 mg/dL (ref 1.7–2.4)

## 2018-12-05 LAB — PHOSPHORUS: Phosphorus: 2 mg/dL — ABNORMAL LOW (ref 2.5–4.6)

## 2018-12-05 MED ORDER — CEFDINIR 300 MG PO CAPS
300.0000 mg | ORAL_CAPSULE | ORAL | Status: DC
  Administered 2018-12-05 – 2018-12-06 (×2): 300 mg via ORAL
  Filled 2018-12-05 (×2): qty 1

## 2018-12-05 MED ORDER — LOPERAMIDE HCL 2 MG PO CAPS
2.0000 mg | ORAL_CAPSULE | Freq: Two times a day (BID) | ORAL | Status: DC
  Administered 2018-12-05 – 2018-12-06 (×3): 2 mg via ORAL
  Filled 2018-12-05 (×3): qty 1

## 2018-12-05 MED ORDER — SODIUM PHOSPHATES 45 MMOLE/15ML IV SOLN
30.0000 mmol | Freq: Once | INTRAVENOUS | Status: AC
  Administered 2018-12-05: 30 mmol via INTRAVENOUS
  Filled 2018-12-05: qty 10

## 2018-12-05 MED ORDER — SODIUM BICARBONATE 650 MG PO TABS
650.0000 mg | ORAL_TABLET | Freq: Two times a day (BID) | ORAL | Status: DC
  Administered 2018-12-05 – 2018-12-11 (×12): 650 mg via ORAL
  Filled 2018-12-05 (×12): qty 1

## 2018-12-05 NOTE — Progress Notes (Addendum)
PROGRESS NOTE    [name]  TDH:[id number] DOB: 1921-04-27 DOA: 11/28/2018 PCP: Wenda Low, MD    Brief Narrative:  85 year old female with history of anemia, hypertension, hypothyroidism, GERD, hyperlipidemia, paroxysmal SVT presented on 11/28/2018 with generalized weakness, diarrhea and vomiting.  She was found to have acute kidney injury with creatinine of 3.25.  She was started on IV fluids.   Assessment & Plan:   Principal Problem:   AKI (acute kidney injury) ([place]) Active Problems:   Viral gastroenteritis   Generalized weakness   Chronic anemia   Thrombocytopenia (HCC)   E. coli UTI   UTI due to Klebsiella species   Abnormal magnetic resonance cholangiopancreatography (MRCP)   Hypophosphatemia   Hypokalemia   Proctitis   Hypomagnesemia  1 acute kidney injury Likely secondary to prerenal azotemia secondary to severe dehydration from GI losses of diarrhea in the setting of ACE inhibitor use. Patient noted to have presented with a creatinine of 3.25 on admission.  Baseline noted to be 1.1 on March 2018.  Renal function has improved with hydration.  Creatinine of 1.03.  IV fluids have been saline locked.  Follow.    2.  Hypokalemia/hypomagnesemia/hypophosphatemia Likely secondary to GI losses.  Potassium repleted and currently at 4.0.  Phosphorus at 2.0 and as such we will give a dose of sodium phosphate IV 30 mmol x 1.  Magnesium at 2.4.  Follow.  3.  E. coli/Klebsiella pneumonia UTI Patient on IV Rocephin will transition to oral Omnicef and treat for total of 5 days.  Follow.   4.  Probable proctitis/colitis/??  Gastroenteritis GI PCR negative.  LFTs within normal limit.  Abdominal exam is benign.  Lipase noted to be elevated however not consistent with a clinical picture of acute pancreatitis.  CT abdomen and pelvis which was done showed a distended gallbladder with common bile duct approximately 14 mm without bile duct stone noted.  Right upper quadrant  ultrasound showed a dilated common bile duct with intrahepatic duct concern for obstruction.  CT abdomen and pelvis which was done suggestive of proctitis/colitis with diverticulosis without diverticulitis.  C. difficile PCR obtained negative.  Patient given a dose of Imodium 12/02/2018 with some improvement with diarrhea.  Gemfibrozil has been discontinued.  IV Flagyl has been discontinued.  MRCP obtained with mild common duct dependent stones, maximally 7 mm.  Equivocal distal common duct filling defect for which larger stone cannot be excluded.  Resultant intra-and extrahepatic biliary duct dilatation with gallbladder distention.  Small bilateral pleural effusions with bibasilar atelectasis.  Gastric underdistention with apparent wall thickening cannot exclude gastritis.  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  Patient on IV Rocephin which will transition to [facility].  Patient currently afebrile.  Due to abnormal MRCP, GI was consulted.  Patient noted to have had 3 bowel movements over the past 24 hours which have been large and loose.  GI not planning a flexible sigmoidoscopy at this time and recommending Imodium 2 mg twice daily as needed.  GI following and appreciate input and recommendations.  Follow.  5.  Choledocholithiasis Noted on MRCP.  Patient with normal LFTs.  Patient asymptomatic.  GI consulted and discussed ERCP with her daughter and both she and GI MD felt was that ERCP was not needed at this time given the stones were not causing any symptoms of obstruction.  Per GI if patient should later develop issues with a stone such as bowel obstruction, cholangitis or  pancreatitis would rediscuss ERCP at that time.  Patient on IV Rocephin which will transition to oral Omnicef and treat for total of 5 days.  6.  Non-anion gap metabolic acidosis Secondary to diarrhea.  Improved.  Bicarb drip has been discontinued.  Chest x-ray  negative for any acute infiltrates.  Unlikely patient has COVID-19.  Isolation has been discontinued.  PT recommending SNF placement.  Will place on some oral bicarb tablets.  Follow.  7.  Anemia of chronic disease H&H stable.  8.  Hypertension BP improved with addition of hydralazine to home regimen of metoprolol and Cardizem.  Monitor blood pressure closely may need to decrease hydralazine dose or stop if blood pressure drops too low.  Follow.   9.  Peripheral vascular disease Continue Plavix.  10.  Hypothyroidism Continue Synthroid.  11.  Gastroesophageal reflux disease Continue PPI.  12.  Depression Stable.  Continue home regimen Zoloft.     DVT prophylaxis: Heparin Code Status: DNR Family Communication: No family at bedside.  Tried calling daughter on the telephone however received voicemail. Disposition Plan: To be determined.  Probable SNF   Consultants:   Gastroenterolog: Dr. Hilarie Fredrickson 12/04/2018    Procedures:   CT abdomen and pelvis 12/01/2018  Chest x-ray 11/28/2018  MRCP 12/03/2018  Renal ultrasound 11/28/2018  Right upper quadrant ultrasound 12/03/2018  Antimicrobials:   IV Rocephin 12/01/2018>>>> 12/05/2018  IV Flagyl 12/02/2018>>> 12/03/2018  Oral Vantin 12/06/2018   Subjective: Patient sitting up in bed eating potato soup.  Denies any chest pain or shortness of breath.  Denies any abdominal pain.  Patient not sure whether she had any bowel movements today.   Objective: Vitals:   12/04/18 2100 12/05/18 0011 12/05/18 0500 12/05/18 0529  BP: (!) 160/63 (!) 155/57  (!) 141/55  Pulse: (!) 59 63  63  Resp: 16     Temp: 98.1 F (36.7 C)   99.8 F (37.7 C)  TempSrc: Oral   Oral  SpO2: 96%   95%  Weight:   52.7 kg   Height:        Intake/Output Summary (Last 24 hours) at 12/05/2018 1116 Last data filed at 12/05/2018 0828 Gross per 24 hour  Intake 1265.37 ml  Output 350 ml  Net 915.37 ml   Filed Weights   12/02/18 0500 12/04/18 0500 12/05/18 0500    Weight: 52.5 kg 51 kg 52.7 kg    Examination:  General exam: NAD Respiratory system: Lungs are to auscultation bilaterally.  No wheezes, no crackles, no rhonchi.  Respiratory effort normal. Cardiovascular system: Regular rate rhythm no murmurs rubs or gallops.  No JVD.  No lower extremity edema. Gastrointestinal system: Abdomen is soft, nontender, nondistended, positive bowel sounds.  No rebound.  No guarding.  Central nervous system: Alert and oriented. No focal neurological deficits. Extremities: Symmetric 5 x 5 power. Skin: No rashes, lesions or ulcers Psychiatry: Judgement and insight appear normal. Mood & affect appropriate.     Data Reviewed: I have personally reviewed following labs and imaging studies  CBC: Recent Labs  Lab 12/01/18 0825 12/02/18 0344 12/03/18 0350 12/04/18 0803 12/05/18 0345  WBC 4.7 3.3* 3.9* 5.6 5.4  NEUTROABS 3.1 1.6* 2.0 3.5 3.1  HGB 9.6* 8.8* 9.3* 10.2* 9.3*  HCT 29.8* 25.9* 28.9* 31.0* 29.3*  MCV 89.2 87.8 89.2 89.6 90.2  PLT 122* 105* 134* 183 599   Basic Metabolic Panel: Recent Labs  Lab 12/01/18 0825 12/02/18 0344 12/03/18 0350 12/04/18 0803 12/05/18 0345  NA 140 142 143 142  136  K 3.1* 2.6* 3.8 3.4* 4.0  CL 112* 108 112* 107 108  CO2 19* 24 22 21* 18*  GLUCOSE 127* 114* 106* 91 98  BUN 41* 28* 17 15 15   CREATININE 1.21* 1.13* 1.07* 1.08* 1.03*  CALCIUM 8.3* 8.1* 7.9* 8.8* 8.4*  MG 1.9 1.7 1.6* 1.6* 2.4  PHOS  --   --   --  <1.0* 2.0*   GFR: Estimated Creatinine Clearance: 24.2 mL/min (A) (by C-G formula based on SCr of 1.03 mg/dL (H)). Liver Function Tests: Recent Labs  Lab 11/28/18 2003 12/02/18 0344 12/04/18 0803 12/05/18 0345  AST 19 16 19 20   ALT 9 8 11 11   ALKPHOS 76 61 66 56  BILITOT 0.7 0.6 0.7 0.6  PROT 6.8 4.9* 5.7* 5.2*  ALBUMIN 3.6 2.4* 2.9* 2.7*   Recent Labs  Lab 11/28/18 2145  LIPASE 80*   No results for input(s): AMMONIA in the last 168 hours. Coagulation Profile: No results for input(s):  INR, PROTIME in the last 168 hours. Cardiac Enzymes: No results for input(s): CKTOTAL, CKMB, CKMBINDEX, TROPONINI in the last 168 hours. BNP (last 3 results) No results for input(s): PROBNP in the last 8760 hours. HbA1C: No results for input(s): HGBA1C in the last 72 hours. CBG: No results for input(s): GLUCAP in the last 168 hours. Lipid Profile: No results for input(s): CHOL, HDL, LDLCALC, TRIG, CHOLHDL, LDLDIRECT in the last 72 hours. Thyroid Function Tests: No results for input(s): TSH, T4TOTAL, FREET4, T3FREE, THYROIDAB in the last 72 hours. Anemia Panel: No results for input(s): VITAMINB12, FOLATE, FERRITIN, TIBC, IRON, RETICCTPCT in the last 72 hours. Sepsis Labs: Recent Labs  Lab 11/28/18 1928  LATICACIDVEN 0.9    Recent Results (from the past 240 hour(s))  Gastrointestinal Panel by PCR , Stool     Status: None   Collection Time: 11/28/18  9:48 PM  Result Value Ref Range Status   Campylobacter species NOT DETECTED NOT DETECTED Final   Plesimonas shigelloides NOT DETECTED NOT DETECTED Final   Salmonella species NOT DETECTED NOT DETECTED Final   Yersinia enterocolitica NOT DETECTED NOT DETECTED Final   Vibrio species NOT DETECTED NOT DETECTED Final   Vibrio cholerae NOT DETECTED NOT DETECTED Final   Enteroaggregative E coli (EAEC) NOT DETECTED NOT DETECTED Final   Enteropathogenic E coli (EPEC) NOT DETECTED NOT DETECTED Final   Enterotoxigenic E coli (ETEC) NOT DETECTED NOT DETECTED Final   Shiga like toxin producing E coli (STEC) NOT DETECTED NOT DETECTED Final   Shigella/Enteroinvasive E coli (EIEC) NOT DETECTED NOT DETECTED Final   Cryptosporidium NOT DETECTED NOT DETECTED Final   Cyclospora cayetanensis NOT DETECTED NOT DETECTED Final   Entamoeba histolytica NOT DETECTED NOT DETECTED Final   Giardia lamblia NOT DETECTED NOT DETECTED Final   Adenovirus F40/41 NOT DETECTED NOT DETECTED Final   Astrovirus NOT DETECTED NOT DETECTED Final   Norovirus GI/GII NOT  DETECTED NOT DETECTED Final   Rotavirus A NOT DETECTED NOT DETECTED Final   Sapovirus (I, II, IV, and V) NOT DETECTED NOT DETECTED Final    Comment: Performed at [facility], [address] [place], [place] [address]  Culture, Urine     Status: Abnormal   Collection Time: 12/01/18  9:12 AM  Result Value Ref Range Status   Specimen Description URINE, CLEAN CATCH  Final   Special Requests   Final    NONE Performed at [facility], [address], [place], [place] [address]    Culture (A)  Final    >=100,000 COLONIES/mL ESCHERICHIA COLI 50,000 COLONIES/mL KLEBSIELLA PNEUMONIAE    Report Status 12/04/2018 FINAL  Final   Organism ID, Bacteria ESCHERICHIA COLI (A)  Final   Organism ID, Bacteria KLEBSIELLA PNEUMONIAE (A)  Final      Susceptibility   Escherichia coli - MIC*    AMPICILLIN 8 SENSITIVE Sensitive     CEFAZOLIN <=4 SENSITIVE Sensitive     CEFTRIAXONE <=1 SENSITIVE Sensitive     CIPROFLOXACIN <=0.25 SENSITIVE Sensitive     GENTAMICIN <=1 SENSITIVE Sensitive     IMIPENEM <=0.25 SENSITIVE Sensitive     NITROFURANTOIN <=16 SENSITIVE Sensitive     TRIMETH/SULFA <=20 SENSITIVE Sensitive     AMPICILLIN/SULBACTAM 4 SENSITIVE Sensitive     PIP/TAZO <=4 SENSITIVE Sensitive     Extended ESBL NEGATIVE Sensitive     * >=100,000 COLONIES/mL ESCHERICHIA COLI   Klebsiella pneumoniae - MIC*    AMPICILLIN >=32 RESISTANT Resistant     CEFAZOLIN <=4 SENSITIVE Sensitive     CEFTRIAXONE <=1 SENSITIVE Sensitive     CIPROFLOXACIN <=0.25 SENSITIVE Sensitive     GENTAMICIN <=1 SENSITIVE Sensitive     IMIPENEM <=0.25 SENSITIVE Sensitive     NITROFURANTOIN 64 INTERMEDIATE Intermediate     TRIMETH/SULFA <=20 SENSITIVE Sensitive     AMPICILLIN/SULBACTAM 16 INTERMEDIATE Intermediate     PIP/TAZO 16 SENSITIVE Sensitive     Extended ESBL NEGATIVE Sensitive     * 50,000 COLONIES/mL KLEBSIELLA PNEUMONIAE  C difficile quick scan w PCR reflex     Status: None   Collection Time:  12/04/18  7:00 AM  Result Value Ref Range Status   C Diff antigen NEGATIVE NEGATIVE Final   C Diff toxin NEGATIVE NEGATIVE Final   C Diff interpretation No C. difficile detected.  Final    Comment: Performed at [facility], [place] [address], [place], [place] [address]         Radiology Studies: Mr 3d Recon At Scanner  Result Date: 12/03/2018 CLINICAL DATA:  Common bile duct obstruction.  Upper abdominal pain. EXAM: MRI ABDOMEN WITHOUT AND WITH CONTRAST (INCLUDING MRCP) TECHNIQUE: Multiplanar multisequence MR imaging of the abdomen was performed both before and after the administration of intravenous contrast. Heavily T2-weighted images of the biliary and pancreatic ducts were obtained, and three-dimensional MRCP images were rendered by post processing. CONTRAST:  6 cc of Gadavist COMPARISON:  414 ultrasound.  12/01/2018 CT. FINDINGS: Mild to moderate motion degradation throughout. Lower chest: Small bilateral pleural effusions with bibasilar atelectasis. Mild cardiomegaly. Hepatobiliary: No suspicious liver lesion. Scattered hepatic cysts or bile duct hamartomas. Gallbladder distension, without stone or acute cholecystitis. Intrahepatic duct dilatation is again identified. The common duct is dilated including at 1.3 cm in the porta hepatis on image 38/4. There are foci of dependent abnormal signal within the mid common duct, including at 7 mm on image 42/14. At the level of the ampulla, there is an equivocal filling defect which is most suspicious on coronal reformats, including on image 8/8. Pancreas: Atrophy and prominence of the pancreatic duct/side branches, likely within normal variation for age. No acute inflammation. Spleen: Multi septated cystic lesion within the medial spleen measures 4.3 x 3.8 cm. Adrenals/Urinary Tract: Normal adrenal glands. Bilateral renal lesions, primarily felt to represent cysts. Some demonstrate precontrast T1 hyperintensity on series 14, favoring minimally  complex cysts. No hydronephrosis. Stomach/Bowel: The stomach is underdistended but appears thick walled, including on series 14. Normal abdominal small bowel loops. Colonic diverticula. Vascular/Lymphatic: Advanced aortic atherosclerosis  with ulcerative plaque throughout. No aneurysm. No abdominal adenopathy. Other:  Fluid in the pelvis, suboptimally and incompletely imaged. Musculoskeletal: No acute osseous abnormality. IMPRESSION: 1. Mild to moderate motion degradation. 2. Mid common duct dependent stones, maximally 7 mm. Equivocal distal common duct filling defect for which a larger stone cannot be excluded. Resultant intra and extrahepatic biliary duct dilatation with gallbladder distension. 3. Small bilateral pleural effusions with bibasilar atelectasis. 4. Gastric underdistention with apparent wall thickening. Cannot exclude gastritis. 5.  Aortic Atherosclerosis (ICD10-I70.0). 6. 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 Electronically Signed   By: Abigail Miyamoto M.D.   On: 12/03/2018 16:49   Mr Abdomen Mrcp Moise Boring Contast  Result Date: 12/03/2018 CLINICAL DATA:  Common bile duct obstruction.  Upper abdominal pain. EXAM: MRI ABDOMEN WITHOUT AND WITH CONTRAST (INCLUDING MRCP) TECHNIQUE: Multiplanar multisequence MR imaging of the abdomen was performed both before and after the administration of intravenous contrast. Heavily T2-weighted images of the biliary and pancreatic ducts were obtained, and three-dimensional MRCP images were rendered by post processing. CONTRAST:  6 cc of Gadavist COMPARISON:  414 ultrasound.  12/01/2018 CT. FINDINGS: Mild to moderate motion degradation throughout. Lower chest: Small bilateral pleural effusions with bibasilar atelectasis. Mild cardiomegaly. Hepatobiliary: No suspicious liver lesion. Scattered hepatic cysts or bile duct hamartomas. Gallbladder distension, without stone or acute cholecystitis.  Intrahepatic duct dilatation is again identified. The common duct is dilated including at 1.3 cm in the porta hepatis on image 38/4. There are foci of dependent abnormal signal within the mid common duct, including at 7 mm on image 42/14. At the level of the ampulla, there is an equivocal filling defect which is most suspicious on coronal reformats, including on image 8/8. Pancreas: Atrophy and prominence of the pancreatic duct/side branches, likely within normal variation for age. No acute inflammation. Spleen: Multi septated cystic lesion within the medial spleen measures 4.3 x 3.8 cm. Adrenals/Urinary Tract: Normal adrenal glands. Bilateral renal lesions, primarily felt to represent cysts. Some demonstrate precontrast T1 hyperintensity on series 14, favoring minimally complex cysts. No hydronephrosis. Stomach/Bowel: The stomach is underdistended but appears thick walled, including on series 14. Normal abdominal small bowel loops. Colonic diverticula. Vascular/Lymphatic: Advanced aortic atherosclerosis with ulcerative plaque throughout. No aneurysm. No abdominal adenopathy. Other:  Fluid in the pelvis, suboptimally and incompletely imaged. Musculoskeletal: No acute osseous abnormality. IMPRESSION: 1. Mild to moderate motion degradation. 2. Mid common duct dependent stones, maximally 7 mm. Equivocal distal common duct filling defect for which a larger stone cannot be excluded. Resultant intra and extrahepatic biliary duct dilatation with gallbladder distension. 3. Small bilateral pleural effusions with bibasilar atelectasis. 4. Gastric underdistention with apparent wall thickening. Cannot exclude gastritis. 5.  Aortic Atherosclerosis (ICD10-I70.0). 6. 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 Electronically Signed   By: Abigail Miyamoto M.D.   On: 12/03/2018 16:49        Scheduled Meds:  brimonidine  1 drop Left Eye TID   [START ON  12/06/2018] cefdinir  300 mg Oral Q12H   clopidogrel  75 mg Oral QODAY   diltiazem  120 mg Oral Daily   dorzolamide-timolol  1 drop Left Eye BID   feeding supplement (ENSURE ENLIVE)  237 mL Oral BID BM   ferrous sulfate  325 mg Oral QPM   heparin  5,000 Units Subcutaneous Q8H   hydrALAZINE  25 mg Oral BID  latanoprost  1 drop Both Eyes QHS   levothyroxine  25 mcg Oral QODAY   levothyroxine  50 mcg Oral QODAY   metoprolol tartrate  50 mg Oral BID   metroNIDAZOLE  500 mg Oral Q8H   pantoprazole  40 mg Oral BID AC   sertraline  100 mg Oral QHS   Continuous Infusions:  sodium phosphate  Dextrose 5% IVPB       LOS: 6 days    Time spent: 40 minutes    Irine Seal, MD [facility]  If 7PM-7AM, please contact night-coverage [email or web address] 12/05/2018, 11:16 AM

## 2018-12-05 NOTE — Care Management Important Message (Signed)
Important Message  Patient Details  Name: [name] MRN: [id number] Date of Birth: 1920/12/31   Medicare Important Message Given:  Yes    Orbie Pyo 12/05/2018, 12:02 PM

## 2018-12-05 NOTE — Progress Notes (Signed)
PHARMACY NOTE:  ANTIMICROBIAL RENAL DOSAGE ADJUSTMENT  Current antimicrobial regimen includes a mismatch between antimicrobial dosage and estimated renal function.  As per policy approved by the Pharmacy & Therapeutics and Medical Executive Committees, the antimicrobial dosage will be adjusted accordingly.  Current antimicrobial dosage:  Cefdinir 300mg  BID  Indication: UTI/gastroenteritis  Renal Function:  Estimated Creatinine Clearance: 24.2 mL/min (A) (by C-G formula based on SCr of 1.03 mg/dL (H)). []      On intermittent HD, scheduled: []      On CRRT    Antimicrobial dosage has been changed to:  Cefdinir 300mg  q24h  Additional comments:  Kodie Kishi A. Levada Dy, PharmD, [place] Please utilize Amion for appropriate phone number to reach the unit pharmacist ([place])    12/05/2018 11:37 AM

## 2018-12-05 NOTE — Progress Notes (Signed)
Audible wheeze at 0000; lungs clear on auscultation. Patient stated has had cough intermittently prior to hospitalization.   Put patient on house tray to ensure meals received. Called service response and confirmed patient did not receive breakfast nor dinner tray on 4/15  During assessment patient had mild disorientation to sequence of events and date. Patient conversational and aware of situation. Expressed desire for more information regarding current condition and plan for discharge.

## 2018-12-05 NOTE — Progress Notes (Signed)
Physical Therapy Treatment Patient Details Name: [name] MRN: [id number] DOB: September 05, 1920 Today's Date: 12/05/2018    History of Present Illness Pt is a 85 y/o female admitted secondary to nausea, vomitting, diarrhea and weakness. Found to be dehydrated and with an AKI and to have Gastroenteritis. PMH including but not limited to hypertension, hypothyroidism, GERD, hyperlipidemia, paroxysmal SVT.     PT Comments    Pt progressing towards goals. Improved balance noted when performing transfers this session, however, pt continues to be limited secondary to bowel/bladder incontinence. Required min A with use of RW to perform transfers. Current recommendations appropriate. Will continue to follow acutely to maximize functional mobility independence and safety.     Follow Up Recommendations  SNF;Supervision/Assistance - 24 hour(if refuses, home first and 24/7)     Equipment Recommendations  None recommended by PT    Recommendations for Other Services       Precautions / Restrictions Precautions Precautions: Fall Precaution Comments: pt incontinent of bowel and bladder upon standing Restrictions Weight Bearing Restrictions: No    Mobility  Bed Mobility Overal bed mobility: Needs Assistance Bed Mobility: Rolling;Sidelying to Sit;Sit to Sidelying Rolling: Min assist Sidelying to sit: Mod assist     Sit to sidelying: Min assist General bed mobility comments: Min A for assist with rolling and Mod A to come to sitting. Required min A for LE assist to return to sidelying.   Transfers Overall transfer level: Needs assistance Equipment used: Rolling walker (2 wheeled) Transfers: Sit to/from [facility] Sit to Stand: Min assist Stand pivot transfers: Min assist;+2 safety/equipment       General transfer comment: Min A for lift assist and steadying assist to stand. Once standing, pt with bowel and bladder incontinence, so transferred to [facility]. Able to  tolerate prolonged standing for clean up following BM. Pt requiring min A throughout transfer for stedying. Pt able to take improved steps towards [facility] this session. RN in room and requesting to return to bed, so performed stand pivot back to bed with min A and RW.   Ambulation/Gait                 Stairs             Wheelchair Mobility    Modified Rankin (Stroke Patients Only)       Balance Overall balance assessment: Needs assistance Sitting-balance support: Feet supported Sitting balance-Leahy Scale: Fair     Standing balance support: Bilateral upper extremity supported;During functional activity Standing balance-Leahy Scale: Poor Standing balance comment: Reliant on BUE support                             Cognition Arousal/Alertness: Awake/alert Behavior During Therapy: WFL for tasks assessed/performed Overall Cognitive Status: No family/caregiver present to determine baseline cognitive functioning                                        Exercises General Exercises - Lower Extremity Ankle Circles/Pumps: AROM;Both;20 reps;Supine Heel Slides: AROM;Both;10 reps;Supine    General Comments        Pertinent Vitals/Pain Pain Assessment: No/denies pain    Home Living                      Prior Function            PT  Goals (current goals can now be found in the care plan section) Acute Rehab PT Goals Patient Stated Goal: to be able to walk better PT Goal Formulation: With patient Time For Goal Achievement: 12/13/18 Potential to Achieve Goals: Good Progress towards PT goals: Progressing toward goals    Frequency    Min 3X/week      PT Plan Current plan remains appropriate    Co-evaluation              AM-PAC PT "6 Clicks" Mobility   Outcome Measure  Help needed turning from your back to your side while in a flat bed without using bedrails?: A Little Help needed moving from lying on your back to  sitting on the side of a flat bed without using bedrails?: A Lot Help needed moving to and from a bed to a chair (including a wheelchair)?: A Little Help needed standing up from a chair using your arms (e.g., wheelchair or bedside chair)?: A Little Help needed to walk in hospital room?: A Lot Help needed climbing 3-5 steps with a railing? : Total 6 Click Score: 14    End of Session Equipment Utilized During Treatment: Gait belt Activity Tolerance: Patient tolerated treatment well Patient left: with call bell/phone within reach;with nursing/sitter in room;in bed;with bed alarm set Nurse Communication: Mobility status PT Visit Diagnosis: Other abnormalities of gait and mobility (R26.89);Muscle weakness (generalized) (M62.81)     Time: 7116-5790 PT Time Calculation (min) (ACUTE ONLY): 38 min  Charges:  $Therapeutic Activity: 38-52 mins                     Leighton Ruff, PT, DPT  Acute Rehabilitation Services  Pager: [phone] Office: [phone]    Rudean Hitt 12/05/2018, 5:07 PM

## 2018-12-05 NOTE — Progress Notes (Signed)
Progress Note    ASSESSMENT AND PLAN:    49.  85 year old female admitted a couple days ago with vomiting, diarrhea.    Etiology of vomiting / diarrhea unclear. Vomiting resolved but appetite poor per nursing staff.  CT scan reveals "questionable" wall thickening of rectosigmoid colon.  Had total of 3 loose BMs yesterday, none so far this am.  -Flagyl stopped. GI path panel and C-diff both negative. -She had only one dose of Imodium and that was 3 days ago. I'm going to add a small dose of Imodium BID  -if doesn't continue to improve will consider flex sigmoidoscopy.   2. Choledocholithiasis. There is associated intra/extrahepatic duct dilation on MRCP. Liver tests normal.  Denies abdominal pain.  Certainly no evidence for cholangitis.  -Liver tests remain normal. Choledocholithiasis was incidental finding on imaging study. She is of advanced age and asymptomatic. Given risks of procedure / general anesthesia, we are not recommending ERCP.   3. Chronic Jeannette anemia on asa / plavix. On iron at home. Denies overt GI bleeding.   Her hgb is is in 9-10 range now, down from baseline of 11-12.  -monitor for now. Given age would hold off on endoscopic evaluation unless overtly bleeding of hgb continues to decline  4. GERD, on daily PPI   SUBJECTIVE    no nausea. She is eating ~ 25 % of meals. Denies abdominal pain.    OBJECTIVE:     Vital signs in last 24 hours: Temp:  [98.1 F (36.7 C)-99.8 F (37.7 C)] 99.8 F (37.7 C) (04/16 0529) Pulse Rate:  [55-66] 63 (04/16 0529) Resp:  [16-18] 16 (04/15 2100) BP: (141-179)/(50-72) 141/55 (04/16 0529) SpO2:  [94 %-96 %] 95 % (04/16 0529) Weight:  [52.7 kg] 52.7 kg (04/16 0500) Last BM Date: 12/04/18(rectal pouch) General:   Alert, well-developed female in NAD EENT:  Normal hearing, non icteric sclera, conjunctive pink.  Heart:  Regular rate and rhythm; no murmur.  No lower extremity edema   Pulm: Normal respiratory effort, lungs CTA  bilaterally without wheezes or crackles. Abdomen:  Soft, nondistended, nontender.  Normal bowel sounds,.       Neurologic:  Alert, grossly normal neurologically. Psych:  Pleasant, cooperative.  Normal mood and affect.   Intake/Output from previous day: 04/15 0701 - 04/16 0700 In: 1195.4 [P.O.:470; I.V.:120; IV Piggyback:605.4] Out: 350 [Urine:350] Intake/Output this shift: Total I/O In: 120 [P.O.:120] Out: -   Lab Results: Recent Labs    12/03/18 0350 12/04/18 0803 12/05/18 0345  WBC 3.9* 5.6 5.4  HGB 9.3* 10.2* 9.3*  HCT 28.9* 31.0* 29.3*  PLT 134* 183 197   BMET Recent Labs    12/03/18 0350 12/04/18 0803 12/05/18 0345  NA 143 142 136  K 3.8 3.4* 4.0  CL 112* 107 108  CO2 22 21* 18*  GLUCOSE 106* 91 98  BUN 17 15 15   CREATININE 1.07* 1.08* 1.03*  CALCIUM 7.9* 8.8* 8.4*   LFT Recent Labs    12/05/18 0345  PROT 5.2*  ALBUMIN 2.7*  AST 20  ALT 11  ALKPHOS 56  BILITOT 0.6    Mr 3d Recon At Scanner  Result Date: 12/03/2018 CLINICAL DATA:  Common bile duct obstruction.  Upper abdominal pain. EXAM: MRI ABDOMEN WITHOUT AND WITH CONTRAST (INCLUDING MRCP) TECHNIQUE: Multiplanar multisequence MR imaging of the abdomen was performed both before and after the administration of intravenous contrast. Heavily T2-weighted images of the biliary and pancreatic ducts were obtained, and three-dimensional MRCP images  were rendered by post processing. CONTRAST:  6 cc of Gadavist COMPARISON:  414 ultrasound.  12/01/2018 CT. FINDINGS: Mild to moderate motion degradation throughout. Lower chest: Small bilateral pleural effusions with bibasilar atelectasis. Mild cardiomegaly. Hepatobiliary: No suspicious liver lesion. Scattered hepatic cysts or bile duct hamartomas. Gallbladder distension, without stone or acute cholecystitis. Intrahepatic duct dilatation is again identified. The common duct is dilated including at 1.3 cm in the porta hepatis on image 38/4. There are foci of dependent  abnormal signal within the mid common duct, including at 7 mm on image 42/14. At the level of the ampulla, there is an equivocal filling defect which is most suspicious on coronal reformats, including on image 8/8. Pancreas: Atrophy and prominence of the pancreatic duct/side branches, likely within normal variation for age. No acute inflammation. Spleen: Multi septated cystic lesion within the medial spleen measures 4.3 x 3.8 cm. Adrenals/Urinary Tract: Normal adrenal glands. Bilateral renal lesions, primarily felt to represent cysts. Some demonstrate precontrast T1 hyperintensity on series 14, favoring minimally complex cysts. No hydronephrosis. Stomach/Bowel: The stomach is underdistended but appears thick walled, including on series 14. Normal abdominal small bowel loops. Colonic diverticula. Vascular/Lymphatic: Advanced aortic atherosclerosis with ulcerative plaque throughout. No aneurysm. No abdominal adenopathy. Other:  Fluid in the pelvis, suboptimally and incompletely imaged. Musculoskeletal: No acute osseous abnormality. IMPRESSION: 1. Mild to moderate motion degradation. 2. Mid common duct dependent stones, maximally 7 mm. Equivocal distal common duct filling defect for which a larger stone cannot be excluded. Resultant intra and extrahepatic biliary duct dilatation with gallbladder distension. 3. Small bilateral pleural effusions with bibasilar atelectasis. 4. Gastric underdistention with apparent wall thickening. Cannot exclude gastritis. 5.  Aortic Atherosclerosis (ICD10-I70.0). 6. 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 Electronically Signed   By: Abigail Miyamoto M.D.   On: 12/03/2018 16:49   Mr Abdomen Mrcp Moise Boring Contast  Result Date: 12/03/2018 CLINICAL DATA:  Common bile duct obstruction.  Upper abdominal pain. EXAM: MRI ABDOMEN WITHOUT AND WITH CONTRAST (INCLUDING MRCP) TECHNIQUE: Multiplanar multisequence MR imaging of the  abdomen was performed both before and after the administration of intravenous contrast. Heavily T2-weighted images of the biliary and pancreatic ducts were obtained, and three-dimensional MRCP images were rendered by post processing. CONTRAST:  6 cc of Gadavist COMPARISON:  414 ultrasound.  12/01/2018 CT. FINDINGS: Mild to moderate motion degradation throughout. Lower chest: Small bilateral pleural effusions with bibasilar atelectasis. Mild cardiomegaly. Hepatobiliary: No suspicious liver lesion. Scattered hepatic cysts or bile duct hamartomas. Gallbladder distension, without stone or acute cholecystitis. Intrahepatic duct dilatation is again identified. The common duct is dilated including at 1.3 cm in the porta hepatis on image 38/4. There are foci of dependent abnormal signal within the mid common duct, including at 7 mm on image 42/14. At the level of the ampulla, there is an equivocal filling defect which is most suspicious on coronal reformats, including on image 8/8. Pancreas: Atrophy and prominence of the pancreatic duct/side branches, likely within normal variation for age. No acute inflammation. Spleen: Multi septated cystic lesion within the medial spleen measures 4.3 x 3.8 cm. Adrenals/Urinary Tract: Normal adrenal glands. Bilateral renal lesions, primarily felt to represent cysts. Some demonstrate precontrast T1 hyperintensity on series 14, favoring minimally complex cysts. No hydronephrosis. Stomach/Bowel: The stomach is underdistended but appears thick walled, including on series 14. Normal abdominal small bowel loops. Colonic diverticula. Vascular/Lymphatic: Advanced aortic atherosclerosis with ulcerative plaque throughout. No aneurysm. No abdominal adenopathy.  Other:  Fluid in the pelvis, suboptimally and incompletely imaged. Musculoskeletal: No acute osseous abnormality. IMPRESSION: 1. Mild to moderate motion degradation. 2. Mid common duct dependent stones, maximally 7 mm. Equivocal distal common  duct filling defect for which a larger stone cannot be excluded. Resultant intra and extrahepatic biliary duct dilatation with gallbladder distension. 3. Small bilateral pleural effusions with bibasilar atelectasis. 4. Gastric underdistention with apparent wall thickening. Cannot exclude gastritis. 5.  Aortic Atherosclerosis (ICD10-I70.0). 6. 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 Electronically Signed   By: Abigail Miyamoto M.D.   On: 12/03/2018 16:49     Principal Problem:   AKI (acute kidney injury) ([place]) Active Problems:   Viral gastroenteritis   Generalized weakness   Chronic anemia   Thrombocytopenia (HCC)   E. coli UTI   UTI due to Klebsiella species   Abnormal magnetic resonance cholangiopancreatography (MRCP)   Hypophosphatemia   Hypokalemia   Proctitis   Hypomagnesemia     LOS: 6 days   Tye Savoy ,NP 12/05/2018, 9:26 AM

## 2018-12-05 NOTE — Progress Notes (Signed)
  Speech Language Pathology Treatment: Dysphagia  Patient Details Name: [name] MRN: [id number] DOB: May 26, 1921 Today's Date: 12/05/2018 Time: 1607-3710 SLP Time Calculation (min) (ACUTE ONLY): 30 min  Assessment / Plan / Recommendation Clinical Impression  Patient seen to address dysphagia goals with focus of session on direct observation of thin liquids and puree solids with medications, and patient education. Patient is much more lucid and alert today as compared to initial bedside swallow evaluation and does not appear with any confusion. Per patient and RN, she has been eating normal amounts and has not had any difficulty with swallowing. Patient continues to present with mild swallow initiation delay but did not present with any overt s/s of aspiration or penetration. SLP is now deferring MBS as patient appears close to or at baseline functioning for swallow function and concern for aspiration/penetration is lower.   HPI HPI: Pt is a 85 y/o female admitted secondary to nausea, vomitting, diarrhea and weakness. Found to be dehydrated and with an AKI and to have Gastroenteritis. PMH including but not limited to hypertension, hypothyroidism, GERD, hyperlipidemia, paroxysmal SVT.       SLP Plan  Continue with current plan of care       Recommendations  Diet recommendations: Dysphagia 3 (mechanical soft);Thin liquid Liquids provided via: Straw;Cup Medication Administration: Whole meds with liquid Supervision: Patient able to self feed Compensations: Minimize environmental distractions;Slow rate;Small sips/bites Postural Changes and/or Swallow Maneuvers: Seated upright 90 degrees                Oral Care Recommendations: Oral care BID Follow up Recommendations: None SLP Visit Diagnosis: Dysphagia, unspecified (R13.10) Plan: Continue with current plan of care       GO                [name] 12/05/2018, 3:40 PM    Sonia Baller, MA,  [facility] Pager: [phone]

## 2018-12-06 ENCOUNTER — Inpatient Hospital Stay (HOSPITAL_COMMUNITY): Payer: Medicare Other

## 2018-12-06 DIAGNOSIS — R0989 Other specified symptoms and signs involving the circulatory and respiratory systems: Secondary | ICD-10-CM

## 2018-12-06 LAB — BASIC METABOLIC PANEL
Anion gap: 9 (ref 5–15)
BUN: 15 mg/dL (ref 8–23)
CO2: 20 mmol/L — ABNORMAL LOW (ref 22–32)
Calcium: 8.2 mg/dL — ABNORMAL LOW (ref 8.9–10.3)
Chloride: 111 mmol/L (ref 98–111)
Creatinine, Ser: 1.05 mg/dL — ABNORMAL HIGH (ref 0.44–1.00)
GFR calc Af Amer: 52 mL/min — ABNORMAL LOW (ref >60)
GFR calc non Af Amer: 45 mL/min — ABNORMAL LOW (ref >60)
Glucose, Bld: 94 mg/dL (ref 70–99)
Potassium: 3.8 mmol/L (ref 3.5–5.1)
Sodium: 140 mmol/L (ref 135–145)

## 2018-12-06 LAB — CBC
HCT: 27.6 % — ABNORMAL LOW (ref 36.0–46.0)
Hemoglobin: 8.8 g/dL — ABNORMAL LOW (ref 12.0–15.0)
MCH: 29.2 pg (ref 26.0–34.0)
MCHC: 31.9 g/dL (ref 30.0–36.0)
MCV: 91.7 fL (ref 80.0–100.0)
Platelets: 206 10*3/uL (ref 150–400)
RBC: 3.01 MIL/uL — ABNORMAL LOW (ref 3.87–5.11)
RDW: 17.1 % — ABNORMAL HIGH (ref 11.5–15.5)
WBC: 4.9 10*3/uL (ref 4.0–10.5)
nRBC: 0 % (ref 0.0–0.2)

## 2018-12-06 LAB — MAGNESIUM: Magnesium: 2 mg/dL (ref 1.7–2.4)

## 2018-12-06 LAB — PHOSPHORUS: Phosphorus: 3.7 mg/dL (ref 2.5–4.6)

## 2018-12-06 MED ORDER — FUROSEMIDE 10 MG/ML IJ SOLN
20.0000 mg | Freq: Once | INTRAMUSCULAR | Status: AC
  Administered 2018-12-06: 20 mg via INTRAVENOUS
  Filled 2018-12-06: qty 2

## 2018-12-06 MED ORDER — HYDRALAZINE HCL 25 MG PO TABS
25.0000 mg | ORAL_TABLET | Freq: Every day | ORAL | Status: DC
  Administered 2018-12-07 – 2018-12-08 (×2): 25 mg via ORAL
  Filled 2018-12-06 (×2): qty 1

## 2018-12-06 MED ORDER — BENZONATATE 100 MG PO CAPS
100.0000 mg | ORAL_CAPSULE | Freq: Three times a day (TID) | ORAL | Status: DC | PRN
  Administered 2018-12-06: 100 mg via ORAL
  Filled 2018-12-06: qty 1

## 2018-12-06 MED ORDER — LOPERAMIDE HCL 2 MG PO CAPS
2.0000 mg | ORAL_CAPSULE | Freq: Two times a day (BID) | ORAL | Status: DC | PRN
  Administered 2018-12-11: 2 mg via ORAL
  Filled 2018-12-06: qty 1

## 2018-12-06 NOTE — Progress Notes (Addendum)
Nutrition Follow Up   RD working remotely  [place]:   Not applicable  INTERVENTION:    Ensure Enlive po BID, each supplement provides 350 kcal and 20 grams of protein  NUTRITION DIAGNOSIS:   Increased nutrient needs related to acute illness as evidenced by estimated needs, ongoing  GOAL:   Patient will meet greater than or equal to 90% of their needs, progressing  MONITOR:   PO intake, Supplement acceptance, Labs, Weight trends, Skin, I & O's  ASSESSMENT:   85 yo Female with history of anemia, HTN, hypothyroidism, paroxysmal SVT presented with generalized weakness, diarrhea and vomiting.    Admit dx include: [place] [R09.89] AKI (acute kidney injury) ([place]) [N17.9]  Pt s/p bedside swallow eval 4/13 >> mild dysphagia. PO intake has been variable at 0-50% per flowsheet records. Per MAR, drinking some of her Ensure supplements.  GI consulted; notes reviewed. Pt underwent MRCP >> mild CBD stones. Labs & medications reviewed.  C. difficile PCR obtained >> negative.  Diet Order:   Diet Order            DIET DYS 3 Room service appropriate? Yes with Assist; Fluid consistency: Thin  Diet effective now             EDUCATION NEEDS:   Not appropriate for education at this time  Skin:  Skin Assessment: Reviewed RN Assessment  Last BM:  4/16  Height:   Ht Readings from Last 1 Encounters:  11/28/18 4\' 11"  (1.499 m)   Weight:   Wt Readings from Last 1 Encounters:  12/06/18 53.2 kg   Wt Readings from Last 10 Encounters:  12/06/18 53.2 kg  03/16/18 48.1 kg  10/16/16 49 kg  09/12/13 51.9 kg  08/22/13 62 kg   BMI:  Body mass index is 23.69 kg/m.  Estimated Nutritional Needs:   Kcal:  1200-1400  Protein:  60-75 gm  Fluid:  >/= 1.5 L  Arthur Holms, RD, LDN Pager #: [phone] After-Hours Pager #: [phone]

## 2018-12-06 NOTE — Progress Notes (Signed)
PROGRESS NOTE    [name]  KXF:[id number] DOB: 1920-08-23 DOA: 11/28/2018 PCP: Wenda Low, MD    Brief Narrative:  85 year old female with history of anemia, hypertension, hypothyroidism, GERD, hyperlipidemia, paroxysmal SVT presented on 11/28/2018 with generalized weakness, diarrhea and vomiting.  She was found to have acute kidney injury with creatinine of 3.25.  She was started on IV fluids.   Assessment & Plan:   Principal Problem:   AKI (acute kidney injury) ([place]) Active Problems:   Viral gastroenteritis   Generalized weakness   Chronic anemia   Thrombocytopenia (HCC)   E. coli UTI   UTI due to Klebsiella species   Abnormal magnetic resonance cholangiopancreatography (MRCP)   Hypophosphatemia   Hypokalemia   Proctitis   Hypomagnesemia  1 acute kidney injury Likely secondary to prerenal azotemia secondary to severe dehydration from GI losses of diarrhea in the setting of ACE inhibitor use. Patient noted to have presented with a creatinine of 3.25 on admission.  Baseline noted to be 1.1 on March 2018.  Renal function has improved with hydration.  Creatinine of 1.05.  IV fluids have been saline locked.  Follow.    2.  Hypokalemia/hypomagnesemia/hypophosphatemia Likely secondary to GI losses.  Potassium repleted and currently at 3.8.  Phosphorus at 3.7 today.  Magnesium at 2.0.  Follow.  3.  E. coli/Klebsiella pneumonia UTI Patient was on IV Rocephin will transition to oral Omnicef and treat for total of 5 days.  Follow.   4.  Probable proctitis/colitis/??  Gastroenteritis GI PCR negative.  LFTs within normal limit.  Abdominal exam is benign.  Lipase noted to be elevated however not consistent with a clinical picture of acute pancreatitis.  CT abdomen and pelvis which was done showed a distended gallbladder with common bile duct approximately 14 mm without bile duct stone noted.  Right upper quadrant ultrasound showed a dilated common bile duct with intrahepatic  duct concern for obstruction.  CT abdomen and pelvis which was done suggestive of proctitis/colitis with diverticulosis without diverticulitis.  C. difficile PCR obtained negative.  Patient given a dose of Imodium 12/02/2018 with some improvement with diarrhea.  Gemfibrozil has been discontinued.  IV Flagyl has been discontinued.  MRCP obtained with mild common duct dependent stones, maximally 7 mm.  Equivocal distal common duct filling defect for which larger stone cannot be excluded.  Resultant intra-and extrahepatic biliary duct dilatation with gallbladder distention.  Small bilateral pleural effusions with bibasilar atelectasis.  Gastric underdistention with apparent wall thickening cannot exclude gastritis.  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  Patient was on IV Rocephin and has been transitioned to [facility] to treat for total of 5 days.  Afebrile. Due to abnormal MRCP, GI was consulted.  Patient noted to have had 3 bowel movements on 12/04/2018.  Patient was placed on scheduled Imodium which we will change to as needed.  Patient with no bowel movement today.  GI not planning a flexible sigmoidoscopy at this time and recommending Imodium 2 mg twice daily as needed.  GI following and appreciate input and recommendations.  Follow.  5.  Choledocholithiasis Noted on MRCP.  Patient with normal LFTs.  Patient asymptomatic.  GI consulted and discussed ERCP with her daughter and both she and GI MD felt was that ERCP was not needed at this time given the stones were not causing any symptoms of obstruction.  Per GI if patient should later develop issues with a stone such as  bowel obstruction, cholangitis or pancreatitis would rediscuss ERCP at that time.  Patient was on IV Rocephin and has been transitioned to [facility].  Discontinue antibiotics after today's dose as patient would have had a total of 5 days of antibiotic treatment.  Follow.  6.   Non-anion gap metabolic acidosis Secondary to diarrhea.  Improved.  Bicarb drip has been discontinued.  Chest x-ray negative for any acute infiltrates.  Unlikely patient has COVID-19.  Isolation has been discontinued.  PT recommending SNF placement.  Continue oral bicarb tablets.  Follow.  7.  Anemia of chronic disease H&H stable.  8.  Hypertension BP improved with addition of hydralazine to home regimen of metoprolol and Cardizem.  Decrease hydralazine to daily.  Follow.   9.  Peripheral vascular disease Continue Plavix.  10.  Hypothyroidism Continue Synthroid.   11.  Gastroesophageal reflux disease Continue PPI.  12.  Depression Continue Zoloft.  13.  Bibasilar crackles Check a chest x-ray. Lasix 20 mg IV every 12 hours x2 doses.  Decrease hydralazine.  Follow.    DVT prophylaxis: Heparin Code Status: DNR Family Communication: No family at bedside.  Disposition Plan: To be determined.  Probable SNF   Consultants:   Gastroenterolog: Dr. Hilarie Fredrickson 12/04/2018    Procedures:   CT abdomen and pelvis 12/01/2018  Chest x-ray 11/28/2018  MRCP 12/03/2018  Renal ultrasound 11/28/2018  Right upper quadrant ultrasound 12/03/2018  Antimicrobials:   IV Rocephin 12/01/2018>>>> 12/05/2018  IV Flagyl 12/02/2018>>> 12/03/2018  Oral Vantin 12/06/2018 >>>>   Subjective: Patient sitting up in bed.  Denies any chest pain.  Patient with some shortness of breath.  No abdominal pain.  No bowel movement today.  States had not had breakfast and did not order lunch.   Objective: Vitals:   12/05/18 1417 12/05/18 2115 12/06/18 0500 12/06/18 0530  BP: (!) 125/56 (!) 159/58  (!) 150/53  Pulse: 61 66  63  Resp:  18  16  Temp: 98.5 F (36.9 C) 98.8 F (37.1 C)  98.5 F (36.9 C)  TempSrc: Oral Oral  Oral  SpO2: 94% 93%  94%  Weight:   53.2 kg   Height:        Intake/Output Summary (Last 24 hours) at 12/06/2018 1126 Last data filed at 12/06/2018 0545 Gross per 24 hour  Intake 120 ml    Output 400 ml  Net -280 ml   Filed Weights   12/04/18 0500 12/05/18 0500 12/06/18 0500  Weight: 51 kg 52.7 kg 53.2 kg    Examination:  General exam: NAD Respiratory system: Bibasilar crackles. No wheezes, no crackles, no rhonchi.  Respiratory effort normal. Cardiovascular system: RRR no JVD, no murmurs rubs or gallops, no lower extremity edema.  Gastrointestinal system: Abdomen is soft, nontender, nondistended, positive bowel sounds.  No rebound.  No guarding.  Central nervous system: Alert and oriented. No focal neurological deficits. Extremities: Symmetric 5 x 5 power. Skin: No rashes, lesions or ulcers Psychiatry: Judgement and insight appear normal. Mood & affect appropriate.     Data Reviewed: I have personally reviewed following labs and imaging studies  CBC: Recent Labs  Lab 12/01/18 0825 12/02/18 0344 12/03/18 0350 12/04/18 0803 12/05/18 0345 12/06/18 0302  WBC 4.7 3.3* 3.9* 5.6 5.4 4.9  NEUTROABS 3.1 1.6* 2.0 3.5 3.1  --   HGB 9.6* 8.8* 9.3* 10.2* 9.3* 8.8*  HCT 29.8* 25.9* 28.9* 31.0* 29.3* 27.6*  MCV 89.2 87.8 89.2 89.6 90.2 91.7  PLT 122* 105* 134* 183 197 206   Basic  Metabolic Panel: Recent Labs  Lab 12/01/18 0825 12/02/18 0344 12/03/18 0350 12/04/18 0803 12/05/18 0345 12/06/18 0302  NA 140 142 143 142 136 140  K 3.1* 2.6* 3.8 3.4* 4.0 3.8  CL 112* 108 112* 107 108 111  CO2 19* 24 22 21* 18* 20*  GLUCOSE 127* 114* 106* 91 98 94  BUN 41* 28* 17 15 15 15   CREATININE 1.21* 1.13* 1.07* 1.08* 1.03* 1.05*  CALCIUM 8.3* 8.1* 7.9* 8.8* 8.4* 8.2*  MG 1.9 1.7 1.6* 1.6* 2.4  --   PHOS  --   --   --  <1.0* 2.0*  --    GFR: Estimated Creatinine Clearance: 23.9 mL/min (A) (by C-G formula based on SCr of 1.05 mg/dL (H)). Liver Function Tests: Recent Labs  Lab 12/02/18 0344 12/04/18 0803 12/05/18 0345  AST 16 19 20   ALT 8 11 11   ALKPHOS 61 66 56  BILITOT 0.6 0.7 0.6  PROT 4.9* 5.7* 5.2*  ALBUMIN 2.4* 2.9* 2.7*   No results for input(s): LIPASE,  AMYLASE in the last 168 hours. No results for input(s): AMMONIA in the last 168 hours. Coagulation Profile: No results for input(s): INR, PROTIME in the last 168 hours. Cardiac Enzymes: No results for input(s): CKTOTAL, CKMB, CKMBINDEX, TROPONINI in the last 168 hours. BNP (last 3 results) No results for input(s): PROBNP in the last 8760 hours. HbA1C: No results for input(s): HGBA1C in the last 72 hours. CBG: No results for input(s): GLUCAP in the last 168 hours. Lipid Profile: No results for input(s): CHOL, HDL, LDLCALC, TRIG, CHOLHDL, LDLDIRECT in the last 72 hours. Thyroid Function Tests: No results for input(s): TSH, T4TOTAL, FREET4, T3FREE, THYROIDAB in the last 72 hours. Anemia Panel: No results for input(s): VITAMINB12, FOLATE, FERRITIN, TIBC, IRON, RETICCTPCT in the last 72 hours. Sepsis Labs: No results for input(s): PROCALCITON, LATICACIDVEN in the last 168 hours.  Recent Results (from the past 240 hour(s))  Gastrointestinal Panel by PCR , Stool     Status: None   Collection Time: 11/28/18  9:48 PM  Result Value Ref Range Status   Campylobacter species NOT DETECTED NOT DETECTED Final   Plesimonas shigelloides NOT DETECTED NOT DETECTED Final   Salmonella species NOT DETECTED NOT DETECTED Final   Yersinia enterocolitica NOT DETECTED NOT DETECTED Final   Vibrio species NOT DETECTED NOT DETECTED Final   Vibrio cholerae NOT DETECTED NOT DETECTED Final   Enteroaggregative E coli (EAEC) NOT DETECTED NOT DETECTED Final   Enteropathogenic E coli (EPEC) NOT DETECTED NOT DETECTED Final   Enterotoxigenic E coli (ETEC) NOT DETECTED NOT DETECTED Final   Shiga like toxin producing E coli (STEC) NOT DETECTED NOT DETECTED Final   Shigella/Enteroinvasive E coli (EIEC) NOT DETECTED NOT DETECTED Final   Cryptosporidium NOT DETECTED NOT DETECTED Final   Cyclospora cayetanensis NOT DETECTED NOT DETECTED Final   Entamoeba histolytica NOT DETECTED NOT DETECTED Final   Giardia lamblia NOT  DETECTED NOT DETECTED Final   Adenovirus F40/41 NOT DETECTED NOT DETECTED Final   Astrovirus NOT DETECTED NOT DETECTED Final   Norovirus GI/GII NOT DETECTED NOT DETECTED Final   Rotavirus A NOT DETECTED NOT DETECTED Final   Sapovirus (I, II, IV, and V) NOT DETECTED NOT DETECTED Final    Comment: Performed at [facility], [address] [place], [place] [address]  Culture, Urine     Status: Abnormal   Collection Time: 12/01/18  9:12 AM  Result Value Ref Range Status   Specimen Description URINE, CLEAN CATCH  Final   Special Requests   Final    NONE Performed at [facility], [place] [address] [place], [place] [address]    Culture (A)  Final    >=100,000 COLONIES/mL ESCHERICHIA COLI 50,000 COLONIES/mL KLEBSIELLA PNEUMONIAE    Report Status 12/04/2018 FINAL  Final   Organism ID, Bacteria ESCHERICHIA COLI (A)  Final   Organism ID, Bacteria KLEBSIELLA PNEUMONIAE (A)  Final      Susceptibility   Escherichia coli - MIC*    AMPICILLIN 8 SENSITIVE Sensitive     CEFAZOLIN <=4 SENSITIVE Sensitive     CEFTRIAXONE <=1 SENSITIVE Sensitive     CIPROFLOXACIN <=0.25 SENSITIVE Sensitive     GENTAMICIN <=1 SENSITIVE Sensitive     IMIPENEM <=0.25 SENSITIVE Sensitive     NITROFURANTOIN <=16 SENSITIVE Sensitive     TRIMETH/SULFA <=20 SENSITIVE Sensitive     AMPICILLIN/SULBACTAM 4 SENSITIVE Sensitive     PIP/TAZO <=4 SENSITIVE Sensitive     Extended ESBL NEGATIVE Sensitive     * >=100,000 COLONIES/mL ESCHERICHIA COLI   Klebsiella pneumoniae - MIC*    AMPICILLIN >=32 RESISTANT Resistant     CEFAZOLIN <=4 SENSITIVE Sensitive     CEFTRIAXONE <=1 SENSITIVE Sensitive     CIPROFLOXACIN <=0.25 SENSITIVE Sensitive     GENTAMICIN <=1 SENSITIVE Sensitive     IMIPENEM <=0.25 SENSITIVE Sensitive     NITROFURANTOIN 64 INTERMEDIATE Intermediate     TRIMETH/SULFA <=20 SENSITIVE Sensitive     AMPICILLIN/SULBACTAM 16 INTERMEDIATE Intermediate     PIP/TAZO 16 SENSITIVE Sensitive     Extended  ESBL NEGATIVE Sensitive     * 50,000 COLONIES/mL KLEBSIELLA PNEUMONIAE  C difficile quick scan w PCR reflex     Status: None   Collection Time: 12/04/18  7:00 AM  Result Value Ref Range Status   C Diff antigen NEGATIVE NEGATIVE Final   C Diff toxin NEGATIVE NEGATIVE Final   C Diff interpretation No C. difficile detected.  Final    Comment: Performed at [facility], [place] [address] [place], [place] [address]         Radiology Studies: No results found.      Scheduled Meds:  brimonidine  1 drop Left Eye TID   cefdinir  300 mg Oral Q24H   clopidogrel  75 mg Oral QODAY   diltiazem  120 mg Oral Daily   dorzolamide-timolol  1 drop Left Eye BID   feeding supplement (ENSURE ENLIVE)  237 mL Oral BID BM   ferrous sulfate  325 mg Oral QPM   heparin  5,000 Units Subcutaneous Q8H   hydrALAZINE  25 mg Oral BID   latanoprost  1 drop Both Eyes QHS   levothyroxine  25 mcg Oral QODAY   levothyroxine  50 mcg Oral QODAY   metoprolol tartrate  50 mg Oral BID   pantoprazole  40 mg Oral BID AC   sertraline  100 mg Oral QHS   sodium bicarbonate  650 mg Oral BID   Continuous Infusions:    LOS: 7 days    Time spent: 40 minutes    Irine Seal, MD [facility]  If 7PM-7AM, please contact night-coverage [email or web address] 12/06/2018, 11:26 AM

## 2018-12-07 LAB — BASIC METABOLIC PANEL
Anion gap: 11 (ref 5–15)
BUN: 14 mg/dL (ref 8–23)
CO2: 22 mmol/L (ref 22–32)
Calcium: 8.7 mg/dL — ABNORMAL LOW (ref 8.9–10.3)
Chloride: 107 mmol/L (ref 98–111)
Creatinine, Ser: 1.36 mg/dL — ABNORMAL HIGH (ref 0.44–1.00)
GFR calc Af Amer: 38 mL/min — ABNORMAL LOW (ref >60)
GFR calc non Af Amer: 33 mL/min — ABNORMAL LOW (ref >60)
Glucose, Bld: 110 mg/dL — ABNORMAL HIGH (ref 70–99)
Potassium: 3.6 mmol/L (ref 3.5–5.1)
Sodium: 140 mmol/L (ref 135–145)

## 2018-12-07 LAB — CBC
HCT: 27.2 % — ABNORMAL LOW (ref 36.0–46.0)
Hemoglobin: 8.7 g/dL — ABNORMAL LOW (ref 12.0–15.0)
MCH: 29.6 pg (ref 26.0–34.0)
MCHC: 32 g/dL (ref 30.0–36.0)
MCV: 92.5 fL (ref 80.0–100.0)
Platelets: 221 10*3/uL (ref 150–400)
RBC: 2.94 MIL/uL — ABNORMAL LOW (ref 3.87–5.11)
RDW: 17 % — ABNORMAL HIGH (ref 11.5–15.5)
WBC: 4.4 10*3/uL (ref 4.0–10.5)
nRBC: 0 % (ref 0.0–0.2)

## 2018-12-07 LAB — MAGNESIUM: Magnesium: 1.8 mg/dL (ref 1.7–2.4)

## 2018-12-07 LAB — PHOSPHORUS: Phosphorus: 4.3 mg/dL (ref 2.5–4.6)

## 2018-12-07 NOTE — Progress Notes (Addendum)
PROGRESS NOTE    [name]  HUT:[id number] DOB: Jul 16, 1921 DOA: 11/28/2018 PCP: Wenda Low, MD    Brief Narrative:  85 year old female with history of anemia, hypertension, hypothyroidism, GERD, hyperlipidemia, paroxysmal SVT presented on 11/28/2018 with generalized weakness, diarrhea and vomiting.  She was found to have acute kidney injury with creatinine of 3.25.  She was started on IV fluids.   Assessment & Plan:   Principal Problem:   AKI (acute kidney injury) ([place]) Active Problems:   Viral gastroenteritis   Generalized weakness   Chronic anemia   Thrombocytopenia (HCC)   E. coli UTI   UTI due to Klebsiella species   Abnormal magnetic resonance cholangiopancreatography (MRCP)   Hypophosphatemia   Hypokalemia   Proctitis   Hypomagnesemia  1 acute kidney injury Likely secondary to prerenal azotemia secondary to severe dehydration from GI losses of diarrhea in the setting of ACE inhibitor use. Patient noted to have presented with a creatinine of 3.25 on admission.  Baseline noted to be 1.1 on March 2018.  Renal function has improved with hydration.  Creatinine of 1.36.  IV fluids have been saline locked.  Follow.    2.  Hypokalemia/hypomagnesemia/hypophosphatemia Likely secondary to GI losses.  Potassium repleted and currently at 3.6.  Phosphorus at 4.3 today.  Magnesium at 1.8.  Follow.  3.  E. coli/Klebsiella pneumonia UTI Patient was on IV Rocephin will transitioned to oral Omnicef and treated for total of 5 days.  Follow.   4.  Probable proctitis/colitis/??  Gastroenteritis GI PCR negative.  LFTs within normal limit.  Abdominal exam is benign.  Lipase noted to be elevated however not consistent with a clinical picture of acute pancreatitis.  CT abdomen and pelvis which was done showed a distended gallbladder with common bile duct approximately 14 mm without bile duct stone noted.  Right upper quadrant ultrasound showed a dilated common bile duct with  intrahepatic duct concern for obstruction.  CT abdomen and pelvis which was done suggestive of proctitis/colitis with diverticulosis without diverticulitis.  C. difficile PCR obtained negative.  Patient given a dose of Imodium 12/02/2018 with some improvement with diarrhea.  Gemfibrozil has been discontinued.  IV Flagyl has been discontinued.  MRCP obtained with mild common duct dependent stones, maximally 7 mm.  Equivocal distal common duct filling defect for which larger stone cannot be excluded.  Resultant intra-and extrahepatic biliary duct dilatation with gallbladder distention.  Small bilateral pleural effusions with bibasilar atelectasis.  Gastric underdistention with apparent wall thickening cannot exclude gastritis.  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  Patient was on IV Rocephin and has been transitioned to [facility] to treat for total of 5 days.  Afebrile. Due to abnormal MRCP, GI was consulted.  Patient noted to have had 3 bowel movements on 12/04/2018.  Patient was placed on scheduled Imodium which has been changed to as needed.  Patient with no bowel movement today per patient. GI not planning a flexible sigmoidoscopy at this time and recommending Imodium 2 mg twice daily as needed.  Follow.  5.  Choledocholithiasis Noted on MRCP.  Patient with normal LFTs.  Patient asymptomatic.  GI consulted and discussed ERCP with her daughter and both she and GI MD felt was that ERCP was not needed at this time given the stones were not causing any symptoms of obstruction.  Per GI if patient should later develop issues with a stone such as bowel obstruction, cholangitis or pancreatitis would rediscuss  ERCP at that time.  Patient was on IV Rocephin and has been transitioned to [facility].  Antibiotics have been discontinued and patient received a total of 5 days of antibiotic treatment.  No further antibiotics needed at this time.  6.   Non-anion gap metabolic acidosis Secondary to diarrhea.  Improved.  Bicarb drip has been discontinued.  Chest x-ray negative for any acute infiltrates.  Unlikely patient has COVID-19.  Isolation has been discontinued.  PT recommending SNF placement.  Continue oral bicarb tablets.  Follow.  7.  Anemia of chronic disease Hemoglobin stable at 8.7. Follow.  8.  Hypertension BP improved with addition of hydralazine to home regimen of metoprolol and Cardizem.  Decreased hydralazine to daily.  Follow.   9.  Peripheral vascular disease Continue Plavix.  10.  Hypothyroidism Continue Synthroid.  Outpatient follow-up.  11.  Gastroesophageal reflux disease Continue PPI.   12.  Depression Continue Zoloft.  13.  Bibasilar crackles Chest x-ray consistent with developing volume overload versus atelectasis.  Patient given 2 doses of IV Lasix with clinical improvement.  Urine output not properly recorded.  2D echo was ordered however canceled by cardiology due to current [place] pandemic and recommendations for minimizing exposure to patient/staff.  Outpatient follow-up.     DVT prophylaxis: Heparin Code Status: DNR Family Communication: Updated daughter via telephone.  Disposition Plan: Likely skilled nursing facility.     Consultants:   Gastroenterolog: Dr. Hilarie Fredrickson 12/04/2018    Procedures:   CT abdomen and pelvis 12/01/2018  Chest x-ray 11/28/2018  MRCP 12/03/2018  Renal ultrasound 11/28/2018  Right upper quadrant ultrasound 12/03/2018  Antimicrobials:   IV Rocephin 12/01/2018>>>> 12/05/2018  IV Flagyl 12/02/2018>>> 12/03/2018  Oral Vantin 12/06/2018 >>>> 12/06/2018   Subjective: Patient sleeping however easily arousable.  Patient states he feels better than she did yesterday.  Shortness of breath improved.  Denies any chest pain.  No abdominal pain.  Denies any further diarrhea.  Objective: Vitals:   12/06/18 1314 12/06/18 2148 12/07/18 0541 12/07/18 0907  BP: (!) 119/59 133/60 (!)  147/48 (!) 149/58  Pulse: 60 63 60 70  Resp: 16 16 18 18   Temp: 97.8 F (36.6 C) 98.5 F (36.9 C) 98.3 F (36.8 C) 98.1 F (36.7 C)  TempSrc: Oral Oral Oral Oral  SpO2: 93% 96% 96% 96%  Weight:   52.3 kg   Height:        Intake/Output Summary (Last 24 hours) at 12/07/2018 1145 Last data filed at 12/07/2018 0552 Gross per 24 hour  Intake 480 ml  Output 900 ml  Net -420 ml   Filed Weights   12/05/18 0500 12/06/18 0500 12/07/18 0541  Weight: 52.7 kg 53.2 kg 52.3 kg    Examination:  General exam: NAD Respiratory system: Clear to auscultation bilaterally.  No wheezes, no crackles, no rhonchi.  Normal respiratory effort.  Cardiovascular system: Regular rate rhythm no murmurs rubs or gallops.  No JVD.  No lower extremity edema. Gastrointestinal system: Abdomen is nontender, nondistended, soft, positive bowel sounds.  No rebound.  No guarding.  Central nervous system: Alert and oriented. No focal neurological deficits. Extremities: Symmetric 5 x 5 power. Skin: No rashes, lesions or ulcers Psychiatry: Judgement and insight appear normal. Mood & affect appropriate.     Data Reviewed: I have personally reviewed following labs and imaging studies  CBC: Recent Labs  Lab 12/01/18 0825 12/02/18 0344 12/03/18 0350 12/04/18 0803 12/05/18 0345 12/06/18 0302 12/07/18 0238  WBC 4.7 3.3* 3.9* 5.6 5.4 4.9 4.4  NEUTROABS 3.1 1.6* 2.0 3.5 3.1  --   --   HGB 9.6* 8.8* 9.3* 10.2* 9.3* 8.8* 8.7*  HCT 29.8* 25.9* 28.9* 31.0* 29.3* 27.6* 27.2*  MCV 89.2 87.8 89.2 89.6 90.2 91.7 92.5  PLT 122* 105* 134* 183 197 206 308   Basic Metabolic Panel: Recent Labs  Lab 12/03/18 0350 12/04/18 0803 12/05/18 0345 12/06/18 0302 12/07/18 0238  NA 143 142 136 140 140  K 3.8 3.4* 4.0 3.8 3.6  CL 112* 107 108 111 107  CO2 22 21* 18* 20* 22  GLUCOSE 106* 91 98 94 110*  BUN 17 15 15 15 14   CREATININE 1.07* 1.08* 1.03* 1.05* 1.36*  CALCIUM 7.9* 8.8* 8.4* 8.2* 8.7*  MG 1.6* 1.6* 2.4 2.0 1.8   PHOS  --  <1.0* 2.0* 3.7 4.3   GFR: Estimated Creatinine Clearance: 18.3 mL/min (A) (by C-G formula based on SCr of 1.36 mg/dL (H)). Liver Function Tests: Recent Labs  Lab 12/02/18 0344 12/04/18 0803 12/05/18 0345  AST 16 19 20   ALT 8 11 11   ALKPHOS 61 66 56  BILITOT 0.6 0.7 0.6  PROT 4.9* 5.7* 5.2*  ALBUMIN 2.4* 2.9* 2.7*   No results for input(s): LIPASE, AMYLASE in the last 168 hours. No results for input(s): AMMONIA in the last 168 hours. Coagulation Profile: No results for input(s): INR, PROTIME in the last 168 hours. Cardiac Enzymes: No results for input(s): CKTOTAL, CKMB, CKMBINDEX, TROPONINI in the last 168 hours. BNP (last 3 results) No results for input(s): PROBNP in the last 8760 hours. HbA1C: No results for input(s): HGBA1C in the last 72 hours. CBG: No results for input(s): GLUCAP in the last 168 hours. Lipid Profile: No results for input(s): CHOL, HDL, LDLCALC, TRIG, CHOLHDL, LDLDIRECT in the last 72 hours. Thyroid Function Tests: No results for input(s): TSH, T4TOTAL, FREET4, T3FREE, THYROIDAB in the last 72 hours. Anemia Panel: No results for input(s): VITAMINB12, FOLATE, FERRITIN, TIBC, IRON, RETICCTPCT in the last 72 hours. Sepsis Labs: No results for input(s): PROCALCITON, LATICACIDVEN in the last 168 hours.  Recent Results (from the past 240 hour(s))  Gastrointestinal Panel by PCR , Stool     Status: None   Collection Time: 11/28/18  9:48 PM  Result Value Ref Range Status   Campylobacter species NOT DETECTED NOT DETECTED Final   Plesimonas shigelloides NOT DETECTED NOT DETECTED Final   Salmonella species NOT DETECTED NOT DETECTED Final   Yersinia enterocolitica NOT DETECTED NOT DETECTED Final   Vibrio species NOT DETECTED NOT DETECTED Final   Vibrio cholerae NOT DETECTED NOT DETECTED Final   Enteroaggregative E coli (EAEC) NOT DETECTED NOT DETECTED Final   Enteropathogenic E coli (EPEC) NOT DETECTED NOT DETECTED Final   Enterotoxigenic E coli  (ETEC) NOT DETECTED NOT DETECTED Final   Shiga like toxin producing E coli (STEC) NOT DETECTED NOT DETECTED Final   Shigella/Enteroinvasive E coli (EIEC) NOT DETECTED NOT DETECTED Final   Cryptosporidium NOT DETECTED NOT DETECTED Final   Cyclospora cayetanensis NOT DETECTED NOT DETECTED Final   Entamoeba histolytica NOT DETECTED NOT DETECTED Final   Giardia lamblia NOT DETECTED NOT DETECTED Final   Adenovirus F40/41 NOT DETECTED NOT DETECTED Final   Astrovirus NOT DETECTED NOT DETECTED Final   Norovirus GI/GII NOT DETECTED NOT DETECTED Final   Rotavirus A NOT DETECTED NOT DETECTED Final   Sapovirus (I, II, IV, and V) NOT DETECTED NOT DETECTED Final    Comment: Performed at [facility], [address] [place], [place] [address]  Culture, Urine     Status: Abnormal   Collection Time: 12/01/18  9:12 AM  Result Value Ref Range Status   Specimen Description URINE, CLEAN CATCH  Final   Special Requests   Final    NONE Performed at [facility], [address], [place], [place] [address]    Culture (A)  Final    >=100,000 COLONIES/mL ESCHERICHIA COLI 50,000 COLONIES/mL KLEBSIELLA PNEUMONIAE    Report Status 12/04/2018 FINAL  Final   Organism ID, Bacteria ESCHERICHIA COLI (A)  Final   Organism ID, Bacteria KLEBSIELLA PNEUMONIAE (A)  Final      Susceptibility   Escherichia coli - MIC*    AMPICILLIN 8 SENSITIVE Sensitive     CEFAZOLIN <=4 SENSITIVE Sensitive     CEFTRIAXONE <=1 SENSITIVE Sensitive     CIPROFLOXACIN <=0.25 SENSITIVE Sensitive     GENTAMICIN <=1 SENSITIVE Sensitive     IMIPENEM <=0.25 SENSITIVE Sensitive     NITROFURANTOIN <=16 SENSITIVE Sensitive     TRIMETH/SULFA <=20 SENSITIVE Sensitive     AMPICILLIN/SULBACTAM 4 SENSITIVE Sensitive     PIP/TAZO <=4 SENSITIVE Sensitive     Extended ESBL NEGATIVE Sensitive     * >=100,000 COLONIES/mL ESCHERICHIA COLI   Klebsiella pneumoniae - MIC*    AMPICILLIN >=32 RESISTANT Resistant     CEFAZOLIN <=4 SENSITIVE  Sensitive     CEFTRIAXONE <=1 SENSITIVE Sensitive     CIPROFLOXACIN <=0.25 SENSITIVE Sensitive     GENTAMICIN <=1 SENSITIVE Sensitive     IMIPENEM <=0.25 SENSITIVE Sensitive     NITROFURANTOIN 64 INTERMEDIATE Intermediate     TRIMETH/SULFA <=20 SENSITIVE Sensitive     AMPICILLIN/SULBACTAM 16 INTERMEDIATE Intermediate     PIP/TAZO 16 SENSITIVE Sensitive     Extended ESBL NEGATIVE Sensitive     * 50,000 COLONIES/mL KLEBSIELLA PNEUMONIAE  C difficile quick scan w PCR reflex     Status: None   Collection Time: 12/04/18  7:00 AM  Result Value Ref Range Status   C Diff antigen NEGATIVE NEGATIVE Final   C Diff toxin NEGATIVE NEGATIVE Final   C Diff interpretation No C. difficile detected.  Final    Comment: Performed at [facility], [place] [address] [place], [place] [address]         Radiology Studies: Dg Chest [place] 1 View  Result Date: 12/06/2018 CLINICAL DATA:  85 year old female with cough EXAM: PORTABLE CHEST 1 VIEW COMPARISON:  11/28/2010 FINDINGS: Cardiomediastinal silhouette unchanged in size and contour. No pneumothorax. Blunting of the bilateral costophrenic angles, new from the prior. Mild interlobular septal thickening with patchy opacity at the right lung base. Fullness in the central vasculature. No displaced fracture.  Osteopenia. IMPRESSION: Evidence of developing CHF with small bilateral pleural effusions and likely atelectasis. Infection cannot be excluded. Electronically Signed   By: Corrie Mckusick D.O.   On: 12/06/2018 12:58        Scheduled Meds: . brimonidine  1 drop Left Eye TID  . clopidogrel  75 mg Oral QODAY  . diltiazem  120 mg Oral Daily  . dorzolamide-timolol  1 drop Left Eye BID  . feeding supplement (ENSURE ENLIVE)  237 mL Oral BID BM  . ferrous sulfate  325 mg Oral QPM  . heparin  5,000 Units Subcutaneous Q8H  . hydrALAZINE  25 mg Oral Daily  . latanoprost  1 drop Both Eyes QHS  . levothyroxine  25 mcg Oral QODAY  . levothyroxine  50 mcg Oral  QODAY  . metoprolol  tartrate  50 mg Oral BID  . pantoprazole  40 mg Oral BID AC  . sertraline  100 mg Oral QHS  . sodium bicarbonate  650 mg Oral BID   Continuous Infusions:    LOS: 8 days    Time spent: 35 minutes    Irine Seal, MD [facility]  If 7PM-7AM, please contact night-coverage [email or web address] 12/07/2018, 11:45 AM

## 2018-12-07 NOTE — Progress Notes (Signed)
CARDIOLOGY  Echo ordered  For evaluation of SOB    Hospital course reviewed  Patient treated for gastroenteritis   Received IV fluids    With current COVID pandemic and recommendations for minimizing exposure to patient/staff will cancel echocardiogram for now.   Continue medical therapy  Please call with questions.  Dorris Carnes, MD, [facility]

## 2018-12-08 MED ORDER — HYDRALAZINE HCL 25 MG PO TABS
25.0000 mg | ORAL_TABLET | Freq: Two times a day (BID) | ORAL | Status: DC
  Administered 2018-12-08 – 2018-12-10 (×5): 25 mg via ORAL
  Filled 2018-12-08 (×5): qty 1

## 2018-12-08 NOTE — Plan of Care (Signed)
  Problem: Coping: Goal: Level of anxiety will decrease Outcome: Not Progressing  Patient withdrawn and feeling down being in hospital. Emotional support provided.

## 2018-12-08 NOTE — NC FL2 (Signed)
[place] LEVEL OF CARE SCREENING TOOL     IDENTIFICATION  Patient Name: [name] Birthdate: 11-Nov-1920 Sex: female Admission Date (Current Location): 11/28/2018  [facility] and [place] Number:  Herbalist and Address:  [facility], [place] [address] [place], [place] [address]      Provider Number: [id number]  Attending Physician Name and Address:  Eugenie Filler, MD  Relative Name and Phone Number:  [phone] [name]    Current Level of Care: SNF Recommended Level of Care: [place] Prior Approval Number:    Date Approved/Denied:   PASRR Number:    Discharge Plan: SNF    Current Diagnoses: Patient Active Problem List   Diagnosis Date Noted  . E. coli UTI 12/04/2018  . UTI due to Klebsiella species 12/04/2018  . Abnormal magnetic resonance cholangiopancreatography (MRCP) 12/04/2018  . Hypophosphatemia 12/04/2018  . Hypokalemia 12/04/2018  . Proctitis 12/04/2018  . Hypomagnesemia 12/04/2018  . AKI (acute kidney injury) ([place]) 11/28/2018  . Viral gastroenteritis 11/28/2018  . Generalized weakness 11/28/2018  . Chronic anemia 11/28/2018  . Thrombocytopenia ([place]) 11/28/2018  . Chronic bilateral low back pain 10/31/2016  . Closed subcapital fracture of femur, left, initial encounter ([place]) 10/16/2016  . Peripheral vascular disease ([place]) 10/16/2016  . Essential hypertension 10/16/2016  . Hypothyroidism, acquired 10/16/2016  . Dry eye 04/21/2015  . H/O surgical procedure 05/25/2014  . 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 Pseudoaphakia 04/10/2014  . Acute appendicitis 08/22/2013    Orientation RESPIRATION BLADDER Height & Weight     Self, Time, Place  Normal Incontinent Weight: 115 lb 4.8 oz (52.3 kg) Height:  4\' 11"  (149.9 cm)  BEHAVIORAL SYMPTOMS/MOOD NEUROLOGICAL BOWEL NUTRITION STATUS      Continent Diet(DSY 3 diet thin fluids)  AMBULATORY STATUS COMMUNICATION OF NEEDS Skin    Limited Assist Verbally Normal                       Personal Care Assistance Level of Assistance  Bathing, Feeding, Dressing Bathing Assistance: Limited assistance Feeding assistance: Limited assistance Dressing Assistance: Limited assistance     Functional Limitations Info  Sight, Hearing, Speech Sight Info: Adequate Hearing Info: Adequate Speech Info: Adequate    SPECIAL CARE FACTORS FREQUENCY  PT (By licensed PT), OT (By licensed OT)     PT Frequency: 3x OT Frequency: 3x     Speech Therapy Frequency: 2x      Contractures Contractures Info: Not present    Additional Factors Info  Code Status Code Status Info: Full Code Allergies Info: Gabapentin, Codeine,            Current Medications (12/08/2018):  This is the current hospital active medication list Current Facility-Administered Medications  Medication Dose Route Frequency Provider Last Rate Last Dose  . acetaminophen (TYLENOL) tablet 650 mg  650 mg Oral Q6H PRN Shela Leff, MD       Or  . acetaminophen (TYLENOL) suppository 650 mg  650 mg Rectal Q6H PRN Shela Leff, MD      . alum & mag hydroxide-simeth (MAALOX/MYLANTA) 200-200-20 MG/5ML suspension 15 mL  15 mL Oral Q4H PRN Alekh, Kshitiz, MD      . benzonatate (TESSALON) capsule 100 mg  100 mg Oral TID PRN Eugenie Filler, MD   100 mg at 12/06/18 1240  . brimonidine (ALPHAGAN) 0.15 % ophthalmic solution 1 drop  1 drop Left Eye TID Shela Leff, MD   1 drop  at 12/08/18 0849  . clopidogrel (PLAVIX) tablet 75 mg  75 mg Oral Derrek Gu, MD   75 mg at 12/08/18 0843  . diltiazem (CARDIZEM CD) 24 hr capsule 120 mg  120 mg Oral Daily Starla Link, Kshitiz, MD   120 mg at 12/08/18 0846  . dorzolamide-timolol (COSOPT) 22.3-6.8 MG/ML ophthalmic solution 1 drop  1 drop Left Eye BID Shela Leff, MD   1 drop at 12/08/18 0849  . feeding supplement (ENSURE ENLIVE) (ENSURE ENLIVE) liquid 237 mL  237 mL Oral BID BM Shela Leff,  MD   237 mL at 12/08/18 0848  . ferrous sulfate tablet 325 mg  325 mg Oral QPM Shela Leff, MD   325 mg at 12/07/18 1757  . heparin injection 5,000 Units  5,000 Units Subcutaneous Q8H Shela Leff, MD   5,000 Units at 12/08/18 [phone]  . hydrALAZINE (APRESOLINE) injection 5 mg  5 mg Intravenous Q6H PRN Alekh, Kshitiz, MD      . hydrALAZINE (APRESOLINE) tablet 25 mg  25 mg Oral Daily Eugenie Filler, MD   25 mg at 12/08/18 0846  . latanoprost (XALATAN) 0.005 % ophthalmic solution 1 drop  1 drop Both Eyes QHS Shela Leff, MD   1 drop at 12/07/18 2247  . levothyroxine (SYNTHROID, LEVOTHROID) tablet 25 mcg  25 mcg Oral Derrek Gu, MD   25 mcg at 12/07/18 0547  . levothyroxine (SYNTHROID, LEVOTHROID) tablet 50 mcg  50 mcg Oral Derrek Gu, MD   50 mcg at 12/08/18 0844  . lip balm (CARMEX) ointment 1 application  1 application Topical PRN Alekh, Kshitiz, MD      . loperamide (IMODIUM) capsule 2 mg  2 mg Oral BID PRN Eugenie Filler, MD      . loratadine (CLARITIN) tablet 10 mg  10 mg Oral Daily PRN Shela Leff, MD      . metoprolol tartrate (LOPRESSOR) tablet 50 mg  50 mg Oral BID Shela Leff, MD   50 mg at 12/08/18 0846  . ondansetron (ZOFRAN) injection 4 mg  4 mg Intravenous Q6H PRN Shela Leff, MD      . pantoprazole (PROTONIX) EC tablet 40 mg  40 mg Oral BID AC Alekh, Kshitiz, MD   40 mg at 12/08/18 0844  . sertraline (ZOLOFT) tablet 100 mg  100 mg Oral QHS Shela Leff, MD   100 mg at 12/07/18 2259  . sodium bicarbonate tablet 650 mg  650 mg Oral BID Eugenie Filler, MD   650 mg at 12/08/18 3329     Discharge Medications: Please see discharge summary for a list of discharge medications.  Relevant Imaging Results:  Relevant Lab Results:   Additional Information [id number]  [place] Chard, LCSW

## 2018-12-08 NOTE — Progress Notes (Signed)
PROGRESS NOTE    EARLE BURSON  PZW:[id number] DOB: April 04, 1921 DOA: 11/28/2018 PCP: Wenda Low, MD    Brief Narrative:  85 year old female with history of anemia, hypertension, hypothyroidism, GERD, hyperlipidemia, paroxysmal SVT presented on 11/28/2018 with generalized weakness, diarrhea and vomiting.  She was found to have acute kidney injury with creatinine of 3.25.  She was started on IV fluids.   Assessment & Plan:   Principal Problem:   AKI (acute kidney injury) ([place]) Active Problems:   Viral gastroenteritis   Generalized weakness   Chronic anemia   Thrombocytopenia (HCC)   E. coli UTI   UTI due to Klebsiella species   Abnormal magnetic resonance cholangiopancreatography (MRCP)   Hypophosphatemia   Hypokalemia   Proctitis   Hypomagnesemia  1 acute kidney injury Likely secondary to prerenal azotemia secondary to severe dehydration from GI losses of diarrhea in the setting of ACE inhibitor use. Patient noted to have presented with a creatinine of 3.25 on admission.  Baseline noted to be 1.1 on March 2018.  Renal function has improved with hydration.  Creatinine of 1.36.  IV fluids have been saline locked.  Follow.    2.  Hypokalemia/hypomagnesemia/hypophosphatemia Likely secondary to GI losses.  Potassium repleted and currently at 3.6.  Phosphorus at 4.3.  Magnesium at 1.8.  Repeat labs in the morning.  Follow.  3.  E. coli/Klebsiella pneumonia UTI Patient was on IV Rocephin and transition to oral Omnicef and received a total of 5 days of antibiotic treatment.  No further antibiotics needed.   4.  Probable proctitis/colitis/??  Gastroenteritis GI PCR negative.  LFTs within normal limit.  Abdominal exam is benign.  Lipase noted to be elevated however not consistent with a clinical picture of acute pancreatitis.  CT abdomen and pelvis which was done showed a distended gallbladder with common bile duct approximately 14 mm without bile duct stone noted.  Right upper  quadrant ultrasound showed a dilated common bile duct with intrahepatic duct concern for obstruction.  CT abdomen and pelvis which was done suggestive of proctitis/colitis with diverticulosis without diverticulitis.  C. difficile PCR obtained negative.  Patient given a dose of Imodium 12/02/2018 with some improvement with diarrhea.  Gemfibrozil has been discontinued.  IV Flagyl has been discontinued.  MRCP obtained with mild common duct dependent stones, maximally 7 mm.  Equivocal distal common duct filling defect for which larger stone cannot be excluded.  Resultant intra-and extrahepatic biliary duct dilatation with gallbladder distention.  Small bilateral pleural effusions with bibasilar atelectasis.  Gastric underdistention with apparent wall thickening cannot exclude gastritis.  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  Patient was on IV Rocephin and has been transitioned to [facility] to treat for total of 5 days.  Afebrile. Due to abnormal MRCP, GI was consulted.  Patient noted to have had 3 bowel movements on 12/04/2018.  Patient was placed on scheduled Imodium which has been changed to as needed.  Patient with no bowel movement today per patient. GI not planning a flexible sigmoidoscopy at this time and recommending Imodium 2 mg twice daily as needed.  Follow.  5.  Choledocholithiasis Noted on MRCP.  Patient with normal LFTs.  Patient asymptomatic.  GI consulted and discussed ERCP with her daughter and both she and GI MD felt was that ERCP was not needed at this time given the stones were not causing any symptoms of obstruction.  Per GI if patient should later develop issues with  a stone such as bowel obstruction, cholangitis or pancreatitis would rediscuss ERCP at that time.  Patient was on IV Rocephin and has been transitioned to [facility]  Antibiotics have been discontinued and patient received a total of 5 days of antibiotic  treatment.  No further antibiotics needed at this time.  6.  Non-anion gap metabolic acidosis Secondary to diarrhea.  Improved.  Bicarb drip has been discontinued.  Chest x-ray negative for any acute infiltrates.  Unlikely patient has COVID-19.  Isolation has been discontinued.  PT recommending SNF placement.  Continue oral bicarb tablets.  Follow.  7.  Anemia of chronic disease Hemoglobin stable at 8.7. Follow.  8.  Hypertension BP initially improved with addition of hydralazine to home regimen of metoprolol and Cardizem.  Decreased hydralazine to daily.  Blood pressure back elevated.  Increase hydralazine back to twice daily.  Follow.   9.  Peripheral vascular disease Continue Plavix.  10.  Hypothyroidism Continue Synthroid.  Outpatient follow-up.  11.  Gastroesophageal reflux disease PPI.    12.  Depression Continue Zoloft.  13.  Bibasilar crackles Chest x-ray consistent with developing volume overload versus atelectasis.  Patient given 2 doses of IV Lasix with clinical improvement.  Urine output not properly recorded.  2D echo was ordered however canceled by cardiology due to current [place] pandemic and recommendations for minimizing exposure to patient/staff.  Bibasilar crackles resolved.  Outpatient follow-up.     DVT prophylaxis: Heparin Code Status: DNR Family Communication: No family at bedside.  Disposition Plan: Skilled nursing facility when bed available.    Consultants:   Gastroenterolog: Dr. Hilarie Fredrickson 12/04/2018    Procedures:   CT abdomen and pelvis 12/01/2018  Chest x-ray 11/28/2018  MRCP 12/03/2018  Renal ultrasound 11/28/2018  Right upper quadrant ultrasound 12/03/2018  Antimicrobials:   IV Rocephin 12/01/2018>>>> 12/05/2018  IV Flagyl 12/02/2018>>> 12/03/2018  Oral Vantin 12/06/2018 >>>> 12/06/2018   Subjective: Patient on the telephone.  Denies has been no shortness of breath.  Denies any further diarrhea.  No abdominal pain.  Tolerating current diet.    Objective: Vitals:   12/07/18 1331 12/07/18 2129 12/08/18 0545 12/08/18 0845  BP: (!) 136/57 (!) 153/54 (!) 168/69 (!) 192/88  Pulse: (!) 59 65 68 70  Resp: 16 16 18    Temp: 98.4 F (36.9 C) 98.9 F (37.2 C) 98.4 F (36.9 C) 98.2 F (36.8 C)  TempSrc: Oral Oral Oral Oral  SpO2: 95% 95% 94% 95%  Weight:      Height:        Intake/Output Summary (Last 24 hours) at 12/08/2018 1149 Last data filed at 12/08/2018 0547 Gross per 24 hour  Intake 240 ml  Output 500 ml  Net -260 ml   Filed Weights   12/05/18 0500 12/06/18 0500 12/07/18 0541  Weight: 52.7 kg 53.2 kg 52.3 kg    Examination:  General exam: NAD Respiratory system: CTAB.  No wheezes, no crackles, no rhonchi.  Normal respiratory effort.  Cardiovascular system: RRR no murmurs rubs or gallops.  No JVD.  No lower extremity edema.  Gastrointestinal system: Abdomen is soft, nontender, nondistended, positive bowel sounds.  No rebound.  No guarding.  Central nervous system: Alert and oriented. No focal neurological deficits. Extremities: Symmetric 5 x 5 power. Skin: No rashes, lesions or ulcers Psychiatry: Judgement and insight appear normal. Mood & affect appropriate.     Data Reviewed: I have personally reviewed following labs and imaging studies  CBC: Recent Labs  Lab 12/02/18 0344 12/03/18 0350 12/04/18 1937  12/05/18 0345 12/06/18 0302 12/07/18 0238  WBC 3.3* 3.9* 5.6 5.4 4.9 4.4  NEUTROABS 1.6* 2.0 3.5 3.1  --   --   HGB 8.8* 9.3* 10.2* 9.3* 8.8* 8.7*  HCT 25.9* 28.9* 31.0* 29.3* 27.6* 27.2*  MCV 87.8 89.2 89.6 90.2 91.7 92.5  PLT 105* 134* 183 197 206 347   Basic Metabolic Panel: Recent Labs  Lab 12/03/18 0350 12/04/18 0803 12/05/18 0345 12/06/18 0302 12/07/18 0238  NA 143 142 136 140 140  K 3.8 3.4* 4.0 3.8 3.6  CL 112* 107 108 111 107  CO2 22 21* 18* 20* 22  GLUCOSE 106* 91 98 94 110*  BUN 17 15 15 15 14   CREATININE 1.07* 1.08* 1.03* 1.05* 1.36*  CALCIUM 7.9* 8.8* 8.4* 8.2* 8.7*  MG 1.6*  1.6* 2.4 2.0 1.8  PHOS  --  <1.0* 2.0* 3.7 4.3   GFR: Estimated Creatinine Clearance: 18.3 mL/min (A) (by C-G formula based on SCr of 1.36 mg/dL (H)). Liver Function Tests: Recent Labs  Lab 12/02/18 0344 12/04/18 0803 12/05/18 0345  AST 16 19 20   ALT 8 11 11   ALKPHOS 61 66 56  BILITOT 0.6 0.7 0.6  PROT 4.9* 5.7* 5.2*  ALBUMIN 2.4* 2.9* 2.7*   No results for input(s): LIPASE, AMYLASE in the last 168 hours. No results for input(s): AMMONIA in the last 168 hours. Coagulation Profile: No results for input(s): INR, PROTIME in the last 168 hours. Cardiac Enzymes: No results for input(s): CKTOTAL, CKMB, CKMBINDEX, TROPONINI in the last 168 hours. BNP (last 3 results) No results for input(s): PROBNP in the last 8760 hours. HbA1C: No results for input(s): HGBA1C in the last 72 hours. CBG: No results for input(s): GLUCAP in the last 168 hours. Lipid Profile: No results for input(s): CHOL, HDL, LDLCALC, TRIG, CHOLHDL, LDLDIRECT in the last 72 hours. Thyroid Function Tests: No results for input(s): TSH, T4TOTAL, FREET4, T3FREE, THYROIDAB in the last 72 hours. Anemia Panel: No results for input(s): VITAMINB12, FOLATE, FERRITIN, TIBC, IRON, RETICCTPCT in the last 72 hours. Sepsis Labs: No results for input(s): PROCALCITON, LATICACIDVEN in the last 168 hours.  Recent Results (from the past 240 hour(s))  Gastrointestinal Panel by PCR , Stool     Status: None   Collection Time: 11/28/18  9:48 PM  Result Value Ref Range Status   Campylobacter species NOT DETECTED NOT DETECTED Final   Plesimonas shigelloides NOT DETECTED NOT DETECTED Final   Salmonella species NOT DETECTED NOT DETECTED Final   Yersinia enterocolitica NOT DETECTED NOT DETECTED Final   Vibrio species NOT DETECTED NOT DETECTED Final   Vibrio cholerae NOT DETECTED NOT DETECTED Final   Enteroaggregative E coli (EAEC) NOT DETECTED NOT DETECTED Final   Enteropathogenic E coli (EPEC) NOT DETECTED NOT DETECTED Final    Enterotoxigenic E coli (ETEC) NOT DETECTED NOT DETECTED Final   Shiga like toxin producing E coli (STEC) NOT DETECTED NOT DETECTED Final   Shigella/Enteroinvasive E coli (EIEC) NOT DETECTED NOT DETECTED Final   Cryptosporidium NOT DETECTED NOT DETECTED Final   Cyclospora cayetanensis NOT DETECTED NOT DETECTED Final   Entamoeba histolytica NOT DETECTED NOT DETECTED Final   Giardia lamblia NOT DETECTED NOT DETECTED Final   Adenovirus F40/41 NOT DETECTED NOT DETECTED Final   Astrovirus NOT DETECTED NOT DETECTED Final   Norovirus GI/GII NOT DETECTED NOT DETECTED Final   Rotavirus A NOT DETECTED NOT DETECTED Final   Sapovirus (I, II, IV, and V) NOT DETECTED NOT DETECTED Final    Comment: Performed at  [facility], [address] [place], [place] [address]  Culture, Urine     Status: Abnormal   Collection Time: 12/01/18  9:12 AM  Result Value Ref Range Status   Specimen Description URINE, CLEAN CATCH  Final   Special Requests   Final    NONE Performed at [facility], [place] [address], [place], [place] [address]    Culture (A)  Final    >=100,000 COLONIES/mL ESCHERICHIA COLI 50,000 COLONIES/mL KLEBSIELLA PNEUMONIAE    Report Status 12/04/2018 FINAL  Final   Organism ID, Bacteria ESCHERICHIA COLI (A)  Final   Organism ID, Bacteria KLEBSIELLA PNEUMONIAE (A)  Final      Susceptibility   Escherichia coli - MIC*    AMPICILLIN 8 SENSITIVE Sensitive     CEFAZOLIN <=4 SENSITIVE Sensitive     CEFTRIAXONE <=1 SENSITIVE Sensitive     CIPROFLOXACIN <=0.25 SENSITIVE Sensitive     GENTAMICIN <=1 SENSITIVE Sensitive     IMIPENEM <=0.25 SENSITIVE Sensitive     NITROFURANTOIN <=16 SENSITIVE Sensitive     TRIMETH/SULFA <=20 SENSITIVE Sensitive     AMPICILLIN/SULBACTAM 4 SENSITIVE Sensitive     PIP/TAZO <=4 SENSITIVE Sensitive     Extended ESBL NEGATIVE Sensitive     * >=100,000 COLONIES/mL ESCHERICHIA COLI   Klebsiella pneumoniae - MIC*    AMPICILLIN >=32 RESISTANT Resistant      CEFAZOLIN <=4 SENSITIVE Sensitive     CEFTRIAXONE <=1 SENSITIVE Sensitive     CIPROFLOXACIN <=0.25 SENSITIVE Sensitive     GENTAMICIN <=1 SENSITIVE Sensitive     IMIPENEM <=0.25 SENSITIVE Sensitive     NITROFURANTOIN 64 INTERMEDIATE Intermediate     TRIMETH/SULFA <=20 SENSITIVE Sensitive     AMPICILLIN/SULBACTAM 16 INTERMEDIATE Intermediate     PIP/TAZO 16 SENSITIVE Sensitive     Extended ESBL NEGATIVE Sensitive     * 50,000 COLONIES/mL KLEBSIELLA PNEUMONIAE  C difficile quick scan w PCR reflex     Status: None   Collection Time: 12/04/18  7:00 AM  Result Value Ref Range Status   C Diff antigen NEGATIVE NEGATIVE Final   C Diff toxin NEGATIVE NEGATIVE Final   C Diff interpretation No C. difficile detected.  Final    Comment: Performed at [facility], [place] [address] [place], [place] [address]         Radiology Studies: Dg Chest [place] 1 View  Result Date: 12/06/2018 CLINICAL DATA:  85 year old female with cough EXAM: PORTABLE CHEST 1 VIEW COMPARISON:  11/28/2010 FINDINGS: Cardiomediastinal silhouette unchanged in size and contour. No pneumothorax. Blunting of the bilateral costophrenic angles, new from the prior. Mild interlobular septal thickening with patchy opacity at the right lung base. Fullness in the central vasculature. No displaced fracture.  Osteopenia. IMPRESSION: Evidence of developing CHF with small bilateral pleural effusions and likely atelectasis. Infection cannot be excluded. Electronically Signed   By: Corrie Mckusick D.O.   On: 12/06/2018 12:58        Scheduled Meds: . brimonidine  1 drop Left Eye TID  . clopidogrel  75 mg Oral QODAY  . diltiazem  120 mg Oral Daily  . dorzolamide-timolol  1 drop Left Eye BID  . feeding supplement (ENSURE ENLIVE)  237 mL Oral BID BM  . ferrous sulfate  325 mg Oral QPM  . heparin  5,000 Units Subcutaneous Q8H  . hydrALAZINE  25 mg Oral Daily  . latanoprost  1 drop Both Eyes QHS  . levothyroxine  25 mcg Oral QODAY  .  levothyroxine  50 mcg Oral QODAY  . metoprolol tartrate  50 mg Oral BID  . pantoprazole  40 mg Oral BID AC  . sertraline  100 mg Oral QHS  . sodium bicarbonate  650 mg Oral BID   Continuous Infusions:    LOS: 9 days    Time spent: 35 minutes    Irine Seal, MD [facility]  If 7PM-7AM, please contact night-coverage [email or web address] 12/08/2018, 11:49 AM

## 2018-12-08 NOTE — TOC Initial Note (Signed)
Transition of Care [facility]) - Initial/Assessment Note    Patient Details  Name: [name] MRN: [id number] Date of Birth: 04-Sep-1920  Transition of Care [facility]) CM/SW Contact:    Glendale Chard, LCSW Phone Number: 12/08/2018, 12:20 PM  Clinical Narrative:                 CSW contacted patient's daughter to discuss discharge plan. PT/OT rec's are for SNF rehab. Patient's daughter agrees with medical team recommendations. Patient's daughter prefers placement at [facility] in [place] and also amenable to [place]. CSW acknowledged and will begin referral process. Will continue to assist as needed and begin facilitating referral process.   Expected Discharge Plan: Skilled Nursing Facility Barriers to Discharge: No Barriers Identified   Patient Goals and CMS Choice Patient states their goals for this hospitalization and ongoing recovery are:: I want to go home CMS Medicare.gov Compare Post Acute Care list provided to:: Patient Choice offered to / list presented to : Adult Children  Expected Discharge Plan and Services Expected Discharge Plan: [place] In-house Referral: [facility] Discharge Planning Services: CM Consult Post Acute Care Choice: [place] Living arrangements for the past 2 months: Single Family Home                     HH Arranged: RN, PT [place] Agency: [name]  Prior Living Arrangements/Services Living arrangements for the past 2 months: Single Family Home Lives with:: Adult Children Patient language and need for interpreter reviewed:: No Do you feel safe going back to the place where you live?: No      Need for Family Participation in Patient Care: Yes (Comment) Care giver support system in place?: Yes (comment)   Criminal Activity/Legal Involvement Pertinent to Current Situation/Hospitalization: No - Comment as needed  Activities of Daily Living Home Assistive Devices/Equipment:  Walker (specify type) ADL Screening (condition at time of admission) Patient's cognitive ability adequate to safely complete daily activities?: Yes Is the patient deaf or have difficulty hearing?: No Does the patient have difficulty seeing, even when wearing glasses/contacts?: No Does the patient have difficulty concentrating, remembering, or making decisions?: No Patient able to express need for assistance with ADLs?: Yes Does the patient have difficulty dressing or bathing?: Yes Independently performs ADLs?: No Communication: Independent Dressing (OT): Needs assistance Is this a change from baseline?: Pre-admission baseline Grooming: Needs assistance Is this a change from baseline?: Pre-admission baseline Feeding: Needs assistance Is this a change from baseline?: Pre-admission baseline Bathing: Needs assistance Is this a change from baseline?: Pre-admission baseline Toileting: Needs assistance Is this a change from baseline?: Pre-admission baseline In/Out Bed: Needs assistance Is this a change from baseline?: Pre-admission baseline Walks in Home: Needs assistance Is this a change from baseline?: Pre-admission baseline Does the patient have difficulty walking or climbing stairs?: Yes Weakness of Legs: Both Weakness of Arms/Hands: Both  Permission Sought/Granted Permission sought to share information with : Case Manager, Family Supports Permission granted to share information with : Yes, Verbal Permission Granted  Share Information with NAME: [name] (Daughter)[name] Hemphill (Daughter)     Permission granted to share info w Relationship: [name] [phone]     Emotional Assessment Appearance:: Appears stated age Attitude/Demeanor/Rapport: Engaged Affect (typically observed): Accepting Orientation: : Oriented to Place, Oriented to Self, Oriented to Situation Alcohol / Substance Use: Not Applicable Psych Involvement: No (comment)  Admission diagnosis:  [place]  [R09.89] AKI (acute kidney injury) ([place]) [N17.9]  Patient Active Problem List   Diagnosis Date Noted  . E. coli UTI 12/04/2018  . UTI due to Klebsiella species 12/04/2018  . Abnormal magnetic resonance cholangiopancreatography (MRCP) 12/04/2018  . Hypophosphatemia 12/04/2018  . Hypokalemia 12/04/2018  . Proctitis 12/04/2018  . Hypomagnesemia 12/04/2018  . AKI (acute kidney injury) ([place]) 11/28/2018  . Viral gastroenteritis 11/28/2018  . Generalized weakness 11/28/2018  . Chronic anemia 11/28/2018  . Thrombocytopenia ([place]) 11/28/2018  . Chronic bilateral low back pain 10/31/2016  . Closed subcapital fracture of femur, left, initial encounter ([place]) 10/16/2016  . Peripheral vascular disease ([place]) 10/16/2016  . Essential hypertension 10/16/2016  . Hypothyroidism, acquired 10/16/2016  . Dry eye 04/21/2015  . H/O surgical procedure 05/25/2014  . 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 Pseudoaphakia 04/10/2014  . Acute appendicitis 08/22/2013   PCP:  Wenda Low, MD Pharmacy:   [facility] [address] [place] [address] Phone: [phone] Fax: [phone]     Social Determinants of Health (SDOH) Interventions    Readmission Risk Interventions No flowsheet data found.

## 2018-12-09 LAB — BASIC METABOLIC PANEL
Anion gap: 8 (ref 5–15)
BUN: 17 mg/dL (ref 8–23)
CO2: 21 mmol/L — ABNORMAL LOW (ref 22–32)
Calcium: 8.8 mg/dL — ABNORMAL LOW (ref 8.9–10.3)
Chloride: 107 mmol/L (ref 98–111)
Creatinine, Ser: 1.04 mg/dL — ABNORMAL HIGH (ref 0.44–1.00)
GFR calc Af Amer: 53 mL/min — ABNORMAL LOW (ref >60)
GFR calc non Af Amer: 46 mL/min — ABNORMAL LOW (ref >60)
Glucose, Bld: 110 mg/dL — ABNORMAL HIGH (ref 70–99)
Potassium: 4 mmol/L (ref 3.5–5.1)
Sodium: 136 mmol/L (ref 135–145)

## 2018-12-09 LAB — HEMOGLOBIN AND HEMATOCRIT, BLOOD
HCT: 29.7 % — ABNORMAL LOW (ref 36.0–46.0)
Hemoglobin: 9.3 g/dL — ABNORMAL LOW (ref 12.0–15.0)

## 2018-12-09 LAB — MAGNESIUM: Magnesium: 1.9 mg/dL (ref 1.7–2.4)

## 2018-12-09 MED ORDER — PANTOPRAZOLE SODIUM 40 MG PO TBEC: 40.0000 mg | DELAYED_RELEASE_TABLET | Freq: Two times a day (BID) | ORAL | 0 refills | Status: DC

## 2018-12-09 MED ORDER — SODIUM BICARBONATE 650 MG PO TABS: 650.0000 mg | ORAL_TABLET | Freq: Two times a day (BID) | ORAL | 0 refills | Status: DC

## 2018-12-09 MED ORDER — BENZONATATE 100 MG PO CAPS: 100.0000 mg | ORAL_CAPSULE | Freq: Three times a day (TID) | ORAL | 0 refills | Status: DC | PRN

## 2018-12-09 MED ORDER — HYDRALAZINE HCL 25 MG PO TABS: 25.0000 mg | ORAL_TABLET | Freq: Two times a day (BID) | ORAL | 0 refills | Status: DC

## 2018-12-09 NOTE — Progress Notes (Signed)
CSW has faxed the patient out to other facilities in the area. Awaiting a bed offer.   Domenic Schwab, MSW, [place]

## 2018-12-09 NOTE — Care Management Important Message (Signed)
Important Message  Patient Details  Name: [name] MRN: [id number] Date of Birth: 01-13-1921   Medicare Important Message Given:  Yes    Orbie Pyo 12/09/2018, 2:42 PM

## 2018-12-09 NOTE — Progress Notes (Signed)
Physical Therapy Treatment Patient Details Name: [name] MRN: [id number] DOB: 1921-02-16 Today's Date: 12/09/2018    History of Present Illness Pt is a 85 y/o female admitted secondary to nausea, vomitting, diarrhea and weakness. Found to be dehydrated and with an AKI and to have Gastroenteritis. PMH including but not limited to hypertension, hypothyroidism, GERD, hyperlipidemia, paroxysmal SVT.     PT Comments    Pt progressing towards goals. Was able to increase ambulation distance this session, however, continues to be limited secondary to fatigue. Required min A for steadying assist using RW. Educated about seated HEP. Current recommendations appropriate. Will continue to follow acutely to maximize functional mobility independence and safety.    Follow Up Recommendations  SNF;Supervision/Assistance - 24 hour     Equipment Recommendations  None recommended by PT    Recommendations for Other Services       Precautions / Restrictions Precautions Precautions: Fall Precaution Comments: pt with incontinence during mobility  Restrictions Weight Bearing Restrictions: No    Mobility  Bed Mobility Overal bed mobility: Needs Assistance Bed Mobility: Rolling;Sidelying to Sit Rolling: Supervision Sidelying to sit: Min assist       General bed mobility comments: Pt requiring use of bedrails to roll, but did not require physical assist. Min A for trunk elevation this session.  Transfers Overall transfer level: Needs assistance Equipment used: Rolling walker (2 wheeled) Transfers: Sit to/from Stand Sit to Stand: Min assist         General transfer comment: Min A for lift assist and steadying. Initially with mild posterior lean, however, able to correct with cues.   Ambulation/Gait Ambulation/Gait assistance: Min assist Gait Distance (Feet): 8 Feet Assistive device: Rolling walker (2 wheeled) Gait Pattern/deviations: Step-to pattern;Decreased step length -  right;Decreased step length - left;Decreased weight shift to left;Antalgic Gait velocity: Decreased    General Gait Details: Slow, antalgic gait, however, able to increase ambulation distance. Practiced ambulating forwards and backwards within the room this session. Cues for appropriate use of RW. Min A for steadying assist.    Stairs             Wheelchair Mobility    Modified Rankin (Stroke Patients Only)       Balance Overall balance assessment: Needs assistance Sitting-balance support: Feet supported Sitting balance-Leahy Scale: Fair     Standing balance support: Bilateral upper extremity supported;During functional activity Standing balance-Leahy Scale: Poor Standing balance comment: Reliant on BUE support                             Cognition Arousal/Alertness: Awake/alert Behavior During Therapy: WFL for tasks assessed/performed Overall Cognitive Status: No family/caregiver present to determine baseline cognitive functioning                                        Exercises General Exercises - Lower Extremity Ankle Circles/Pumps: AROM;Both;20 reps;Supine Long Arc Quad: AROM;Both;10 reps;Seated Hip Flexion/Marching: AROM;Both;10 reps;Seated    General Comments        Pertinent Vitals/Pain Pain Assessment: No/denies pain    Home Living                      Prior Function            PT Goals (current goals can now be found in the care plan section) Acute Rehab PT  Goals Patient Stated Goal: to be able to walk better PT Goal Formulation: With patient Time For Goal Achievement: 12/13/18 Potential to Achieve Goals: Good Progress towards PT goals: Progressing toward goals    Frequency    Min 3X/week      PT Plan Current plan remains appropriate    Co-evaluation              AM-PAC PT "6 Clicks" Mobility   Outcome Measure  Help needed turning from your back to your side while in a flat bed without  using bedrails?: A Little Help needed moving from lying on your back to sitting on the side of a flat bed without using bedrails?: A Lot Help needed moving to and from a bed to a chair (including a wheelchair)?: A Little Help needed standing up from a chair using your arms (e.g., wheelchair or bedside chair)?: A Little Help needed to walk in hospital room?: A Little Help needed climbing 3-5 steps with a railing? : Total 6 Click Score: 15    End of Session Equipment Utilized During Treatment: Gait belt Activity Tolerance: Patient tolerated treatment well Patient left: in chair;with call bell/phone within reach;with chair alarm set Nurse Communication: Mobility status PT Visit Diagnosis: Other abnormalities of gait and mobility (R26.89);Muscle weakness (generalized) (M62.81)     Time: 0233-4356 PT Time Calculation (min) (ACUTE ONLY): 35 min  Charges:  $Gait Training: 8-22 mins $Therapeutic Activity: 8-22 mins                     Leighton Ruff, PT, DPT  Acute Rehabilitation Services  Pager: [phone] Office: [phone]    [name] 12/09/2018, 4:35 PM

## 2018-12-09 NOTE — Discharge Summary (Signed)
Physician Discharge Summary  [name] EGB:[id number] DOB: 11/11/1920 DOA: 11/28/2018  PCP: Wenda Low, MD  Admit date: 11/28/2018 Discharge date: 12/09/2018  Time spent: 55 minutes  Recommendations for Outpatient Follow-up:  1. Follow-up with MD at skilled nursing facility.  Patient will need a basic metabolic profile, magnesium level, phosphorus level checked in 1 week to follow-up on electrolytes and renal function.  Patient also need a H&H checked to follow-up on her hemoglobin.   Discharge Diagnoses:  Principal Problem:   AKI (acute kidney injury) ([place]) Active Problems:   Viral gastroenteritis   Generalized weakness   Chronic anemia   Thrombocytopenia (HCC)   E. coli UTI   UTI due to Klebsiella species   Abnormal magnetic resonance cholangiopancreatography (MRCP)   Hypophosphatemia   Hypokalemia   Proctitis   Hypomagnesemia   Discharge Condition: Stable and improved  Diet recommendation: Regular  Filed Weights   12/05/18 0500 12/06/18 0500 12/07/18 0541  Weight: 52.7 kg 53.2 kg 52.3 kg    History of present illness:  Per Dr. Lennice Sites is a 85 y.o. female with medical history significant of anemia, hypertension, hypothyroidism, GERD, hyperlipidemia, paroxysmal SVT presented to the hospital for evaluation of generalized weakness, nausea, vomiting, and nonbloody diarrhea x1 week.  Patient stated her symptoms started initially with diarrhea and then she started vomiting.  She vomited for 2 to 3 days but then vomiting resolved.    At time of admission patient continued to feel nauseous.  She had not been able to eat or drink much.  Denied any fevers, chills, chest pain, shortness of breath, or abdominal pain.  Stated she coughs sometimes at night or in the daytime after eating but there has been no recent change in her chronic cough.  Denied any recent travel, sick contacts, or exposure to any individual with confirmed COVID-19.  Hospital Course:   1 acute kidney injury Likely secondary to prerenal azotemia secondary to severe dehydration from GI losses of diarrhea in the setting of ACE inhibitor use. Patient noted to have presented with a creatinine of 3.25 on admission.  Baseline noted to be 1.1 on March 2018.    Patient was placed on IV fluids, ACE inhibitor was discontinued and nephrotoxins avoided.  Patient's renal function improved on a daily basis such that by day of discharge creatinine was down to 1.04.  IV fluids were saline locked.  Patient remained in stable condition.  Would recommend NOT resuming ACE inhibitor on discharge.  Outpatient follow-up.   2.  Hypokalemia/hypomagnesemia/hypophosphatemia Likely secondary to GI losses.  Patient's electrolytes of potassium magnesium phosphorus were repleted.  Outpatient follow-up.  3.  E. coli/Klebsiella pneumonia UTI Patient was on IV Rocephin and transitioned to oral Omnicef and received a total of 5 days of antibiotic treatment.  No further antibiotics needed.   4.  Probable proctitis/colitis/??  Gastroenteritis GI PCR negative.  LFTs within normal limit.  Abdominal exam is benign.  Lipase noted to be elevated however not consistent with a clinical picture of acute pancreatitis.  CT abdomen and pelvis which was done showed a distended gallbladder with common bile duct approximately 14 mm without bile duct stone noted.  Right upper quadrant ultrasound showed a dilated common bile duct with intrahepatic duct concern for obstruction.  CT abdomen and pelvis which was done suggestive of proctitis/colitis with diverticulosis without diverticulitis.  C. difficile PCR obtained negative.  Patient given a dose of Imodium 12/02/2018 with some improvement with diarrhea.  Gemfibrozil has been  discontinued.  IV Flagyl has been discontinued.  MRCP obtained with mild common duct dependent stones, maximally 7 mm.  Equivocal distal common duct filling defect for which larger stone cannot be excluded.   Resultant intra-and extrahepatic biliary duct dilatation with gallbladder distention.  Small bilateral pleural effusions with bibasilar atelectasis.  Gastric underdistention with apparent wall thickening cannot exclude gastritis.  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  Patient was on IV Rocephin and has been transitioned to [facility] to treat for total of 5 days.  Afebrile. Due to abnormal MRCP, GI was consulted.  Patient noted to have had 3 bowel movements on 12/04/2018.  Patient was placed on scheduled Imodium which was changed to as needed.  Patient with no further diarrhea during the hospitalization.  GI was consulted and followed the patient throughout the hospitalization. GI not planning a flexible sigmoidoscopy at this time and recommended Imodium 2 mg twice daily as needed.    5.  Choledocholithiasis Noted on MRCP.  Patient with normal LFTs.  Patient asymptomatic.  GI consulted and discussed ERCP with her daughter and both she and GI MD felt that ERCP was not needed at this time given the stones were not causing any symptoms of obstruction.  Per GI if patient should later develop issues with a stone such as bowel obstruction, cholangitis or pancreatitis would rediscuss ERCP at that time.  Patient was on IV Rocephin and transitioned to [facility].  Antibiotics have been discontinued and patient received a total of 5 days of antibiotic treatment.  No further antibiotics needed at this time.  6.  Non-anion gap metabolic acidosis Secondary to diarrhea.  Improved.  Bicarb drip has been discontinued.  Chest x-ray negative for any acute infiltrates.  Unlikely patient has COVID-19.  Isolation has been discontinued.  PT recommending SNF placement.  Patient was placed on oral bicarb tablets which will be discharged home for 7 more days.  Will need outpatient follow-up.  7.  Anemia of chronic disease Hemoglobin stable at 8.7.  Follow.  8.  Hypertension BP initially improved with addition of hydralazine to home regimen of metoprolol and Cardizem.  Decreased hydralazine to daily.  Blood pressure back elevated.  Increased hydralazine back to twice daily.  Follow.   9.  Peripheral vascular disease Continued on home regimen of Plavix.  10.  Hypothyroidism Patient maintained on home regimen of Synthroid.   11.  Gastroesophageal reflux disease Patient maintained on a PPI.    12.  Depression Patient maintained on Zoloft throughout the hospitalization.    13.  Bibasilar crackles Chest x-ray consistent with developing volume overload versus atelectasis.  Patient given 2 doses of IV Lasix with clinical improvement.  Urine output not properly recorded.  2D echo was ordered however canceled by cardiology due to current [place] pandemic and recommendations for minimizing exposure to patient/staff.  Bibasilar crackles resolved.  Outpatient follow-up.    Procedures:  CT abdomen and pelvis 12/01/2018  Chest x-ray 11/28/2018  MRCP 12/03/2018  Renal ultrasound 11/28/2018  Right upper quadrant ultrasound 12/03/2018   Consultations:  Gastroenterology: Dr. Hilarie Fredrickson 12/04/2018  Discharge Exam: Vitals:   12/09/18 0511 12/09/18 0513  BP: (!) 172/69   Pulse: 63   Resp:  16  Temp: 98.1 F (36.7 C)   SpO2: 96%     General: NAD Cardiovascular: RRR Respiratory: CTAB  Discharge Instructions   Discharge Instructions    Diet general   Complete by:  As directed  Increase activity slowly   Complete by:  As directed      Allergies as of 12/09/2018      Reactions   Codeine Other (See Comments)   "I feel like I'm floating"   Gabapentin Other (See Comments)   Cannot be on for a good length of time- causes a lot of lethargy   Other Other (See Comments)   All pain meds cause lethargy and disorientation      Medication List    STOP taking these medications   lisinopril 20 MG tablet Commonly known as:   ZESTRIL   omeprazole 20 MG capsule Commonly known as:  PRILOSEC Replaced by:  pantoprazole 40 MG tablet   polyethylene glycol 17 g packet Commonly known as:  MIRALAX / GLYCOLAX   senna-docusate 8.6-50 MG tablet Commonly known as:  Senokot-S     TAKE these medications   acetaminophen 500 MG tablet Commonly known as:  TYLENOL Take 2 tablets (1,000 mg total) by mouth 3 (three) times daily. X 3 DAYS What changed:    when to take this  additional instructions   benzonatate 100 MG capsule Commonly known as:  TESSALON Take 1 capsule (100 mg total) by mouth 3 (three) times daily as needed for cough.   bimatoprost 0.01 % Soln Commonly known as:  LUMIGAN Place 1 drop into both eyes at bedtime.   BIOTIN PO Take 1 tablet by mouth daily.   bisacodyl 10 MG suppository Commonly known as:  DULCOLAX Place 1 suppository (10 mg total) rectally daily as needed for moderate constipation.   brimonidine 0.15 % ophthalmic solution Commonly known as:  ALPHAGAN Place 1 drop into the left eye 3 (three) times daily.   CALCIUM 600+D3 PO Take 1 tablet by mouth 2 (two) times daily.   cloNIDine 0.1 MG tablet Commonly known as:  CATAPRES Take 0.1 mg by mouth 2 (two) times daily as needed (only if systolic number is 789 or greater).   clopidogrel 75 MG tablet Commonly known as:  PLAVIX Take 75 mg by mouth every other day.   diltiazem 120 MG 24 hr capsule Commonly known as:  CARDIZEM CD Take 120 mg by mouth at bedtime.   dorzolamide-timolol 22.3-6.8 MG/ML ophthalmic solution Commonly known as:  COSOPT Place 1 drop into the left eye 2 (two) times daily.   feeding supplement (ENSURE ENLIVE) Liqd Take 237 mLs by mouth 2 (two) times daily between meals.   ferrous sulfate 325 (65 FE) MG EC tablet Take 325 mg by mouth every evening.   fexofenadine 180 MG tablet Commonly known as:  ALLEGRA Take 180 mg by mouth daily.   Fish Oil 1000 MG Caps Take 1,000 mg by mouth 2 (two) times daily.    gemfibrozil 600 MG tablet Commonly known as:  LOPID Take 600 mg by mouth 2 (two) times daily before a meal.   hydrALAZINE 25 MG tablet Commonly known as:  APRESOLINE Take 1 tablet (25 mg total) by mouth 2 (two) times a day.   levothyroxine 50 MCG tablet Commonly known as:  SYNTHROID Take 25-50 mcg by mouth See admin instructions. Take 25 mcg by mouth in the morning before breakfast, alternating with 50 mcg (every other morning)   loperamide 2 MG capsule Commonly known as:  IMODIUM Take 2-4 mg by mouth as needed for diarrhea or loose stools. What changed:  Another medication with the same name was removed. Continue taking this medication, and follow the directions you see here.   metoprolol tartrate  50 MG tablet Commonly known as:  LOPRESSOR Take 50 mg by mouth 2 (two) times daily.   pantoprazole 40 MG tablet Commonly known as:  PROTONIX Take 1 tablet (40 mg total) by mouth 2 (two) times daily before a meal. Take 2 times daily for 2 weeks, then 1 tablet daily/ Replaces:  omeprazole 20 MG capsule   sertraline 100 MG tablet Commonly known as:  ZOLOFT Take 100 mg by mouth at bedtime.   sodium bicarbonate 650 MG tablet Take 1 tablet (650 mg total) by mouth 2 (two) times daily for 7 days.            Durable Medical Equipment  (From admission, onward)         Start     Ordered   12/02/18 1412  For home use only DME Walker rolling  Once    Question:  Patient needs a walker to treat with the following condition  Answer:  Weakness   12/02/18 1412         Allergies  Allergen Reactions  . Codeine Other (See Comments)    "I feel like I'm floating"   . Gabapentin Other (See Comments)    Cannot be on for a good length of time- causes a lot of lethargy  . Other Other (See Comments)    All pain meds cause lethargy and disorientation   Follow-up Information    [facility] Follow up.   Why:  rolling walker will be delivered to bedside prior to discharge       MD  AT SNF Follow up.            The results of significant diagnostics from this hospitalization (including imaging, microbiology, ancillary and laboratory) are listed below for reference.    Significant Diagnostic Studies: Ct Abdomen Pelvis Wo Contrast  Result Date: 12/01/2018 CLINICAL DATA:  Abdominal pain. EXAM: CT ABDOMEN AND PELVIS WITHOUT CONTRAST TECHNIQUE: Multidetector CT imaging of the abdomen and pelvis was performed following the standard protocol without IV contrast. COMPARISON:  CT abdomen dated 08/22/2013. FINDINGS: Lower chest: Bibasilar pleural effusions, RIGHT greater than LEFT, incompletely imaged with associated compressive atelectasis. Hepatobiliary: Gallbladder is prominently distended. No focal liver abnormality. Common bile duct is distended to approximately 14 mm. No bile duct stone seen. Pancreas: Unremarkable. No pancreatic ductal dilatation or surrounding inflammatory changes. Spleen: Splenic cyst versus hemangioma, stable. Otherwise unremarkable. Adrenals/Urinary Tract: Adrenal glands appear normal. 3 mm nonobstructing LEFT renal stone. No hydronephrosis bilaterally. No ureteral or bladder calculi identified. Bladder appears normal. Stomach/Bowel: Extensive diverticulosis of the descending and sigmoid colon but no focal inflammatory changes seen to confirm an acute diverticulitis. No dilated large or small bowel loops. No convincing evidence of active bowel wall inflammation. Questionable thickening of the walls of the rectum. Surgical changes of previous appendectomy. Stomach is unremarkable, partially decompressed. Vascular/Lymphatic: Aortic atherosclerosis. No enlarged lymph nodes seen. Reproductive: Status post hysterectomy. No adnexal masses. Other: Small amount of free fluid in the pelvis. No other free fluid. No abscess collection seen. No free intraperitoneal air. Musculoskeletal: No acute or suspicious osseous finding. Degenerative spondylosis of the slightly scoliotic  thoracolumbar spine, mild to moderate in degree. Compression fracture deformity of the L1 vertebral body, 75-80% compressed anteriorly/centrally with minimal retropulsion, chronic based on appearance, increased compared to interval lumbar spine CT of 10/27/2016. Ill-defined fluid/edema within the subcutaneous soft tissues of the lower abdomen and pelvis suggesting some degree of anasarca. IMPRESSION: 1. Gallbladder is markedly distended. Common bile duct is distended  to approximately 14 mm. No bile duct stone seen. Recommend correlation with liver function tests. Would also consider right upper quadrant ultrasound for further characterization. 2. Colonic diverticulosis without evidence of acute diverticulitis. 3. Questionable thickening of the walls of the rectosigmoid colon, raising the possibility of proctitis/colitis of infectious or inflammatory nature. 4. Small amount of free fluid in the pelvis. No abscess collection seen. No free intraperitoneal air. 5. Bibasilar pleural effusions, right greater than left, incompletely imaged, with associated compressive atelectasis. 6. Ill-defined fluid/edema within the subcutaneous soft tissues of the pelvis suggesting some degree of anasarca. 7. Additional chronic/incidental findings detailed above. Aortic Atherosclerosis (ICD10-I70.0). Electronically Signed   By: Franki Cabot M.D.   On: 12/01/2018 16:04   US Renal  Result Date: 11/28/2018 CLINICAL DATA:  Initial evaluation for acute renal injury. EXAM: RENAL / URINARY TRACT ULTRASOUND COMPLETE COMPARISON:  None available. FINDINGS: Right Kidney: Renal measurements: 7.7 x 3.6 x 4.1 cm = volume: 60.9 mL . Echogenicity within normal limits. Mild diffuse cortical thinning. No mass or hydronephrosis visualized. Left Kidney: Renal measurements: 8.6 x 4.1 x 3.8 cm = volume: 70.6 mL. Echogenicity within normal limits. Mild diffuse cortical thinning. No mass or hydronephrosis visualized. Bladder: Appears normal for degree of  bladder distention. Irregular cluster of cysts measuring up to 4.4 cm noted within the spleen. No internal vascularity or solid component. Partially visualized gallbladder appears somewhat distended. IMPRESSION: 1. Mild diffuse chronic cortical thinning of both kidneys. No hydronephrosis. 2. Incidental 4.4 cm irregular complex cyst versus cluster of cysts within the spleen. 3. Distended gallbladder. Electronically Signed   By: Jeannine Boga M.D.   On: 11/28/2018 22:35   Mr 3d Recon At Scanner  Result Date: 12/03/2018 CLINICAL DATA:  Common bile duct obstruction.  Upper abdominal pain. EXAM: MRI ABDOMEN WITHOUT AND WITH CONTRAST (INCLUDING MRCP) TECHNIQUE: Multiplanar multisequence MR imaging of the abdomen was performed both before and after the administration of intravenous contrast. Heavily T2-weighted images of the biliary and pancreatic ducts were obtained, and three-dimensional MRCP images were rendered by post processing. CONTRAST:  6 cc of Gadavist COMPARISON:  414 ultrasound.  12/01/2018 CT. FINDINGS: Mild to moderate motion degradation throughout. Lower chest: Small bilateral pleural effusions with bibasilar atelectasis. Mild cardiomegaly. Hepatobiliary: No suspicious liver lesion. Scattered hepatic cysts or bile duct hamartomas. Gallbladder distension, without stone or acute cholecystitis. Intrahepatic duct dilatation is again identified. The common duct is dilated including at 1.3 cm in the porta hepatis on image 38/4. There are foci of dependent abnormal signal within the mid common duct, including at 7 mm on image 42/14. At the level of the ampulla, there is an equivocal filling defect which is most suspicious on coronal reformats, including on image 8/8. Pancreas: Atrophy and prominence of the pancreatic duct/side branches, likely within normal variation for age. No acute inflammation. Spleen: Multi septated cystic lesion within the medial spleen measures 4.3 x 3.8 cm. Adrenals/Urinary  Tract: Normal adrenal glands. Bilateral renal lesions, primarily felt to represent cysts. Some demonstrate precontrast T1 hyperintensity on series 14, favoring minimally complex cysts. No hydronephrosis. Stomach/Bowel: The stomach is underdistended but appears thick walled, including on series 14. Normal abdominal small bowel loops. Colonic diverticula. Vascular/Lymphatic: Advanced aortic atherosclerosis with ulcerative plaque throughout. No aneurysm. No abdominal adenopathy. Other:  Fluid in the pelvis, suboptimally and incompletely imaged. Musculoskeletal: No acute osseous abnormality. IMPRESSION: 1. Mild to moderate motion degradation. 2. Mid common duct dependent stones, maximally 7 mm. Equivocal distal common duct filling defect for which  a larger stone cannot be excluded. Resultant intra and extrahepatic biliary duct dilatation with gallbladder distension. 3. Small bilateral pleural effusions with bibasilar atelectasis. 4. Gastric underdistention with apparent wall thickening. Cannot exclude gastritis. 5.  Aortic Atherosclerosis (ICD10-I70.0). 6. 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 Electronically Signed   By: Abigail Miyamoto M.D.   On: 12/03/2018 16:49   Dg Chest Port 1 View  Result Date: 12/06/2018 CLINICAL DATA:  85 year old female with cough EXAM: PORTABLE CHEST 1 VIEW COMPARISON:  11/28/2010 FINDINGS: Cardiomediastinal silhouette unchanged in size and contour. No pneumothorax. Blunting of the bilateral costophrenic angles, new from the prior. Mild interlobular septal thickening with patchy opacity at the right lung base. Fullness in the central vasculature. No displaced fracture.  Osteopenia. IMPRESSION: Evidence of developing CHF with small bilateral pleural effusions and likely atelectasis. Infection cannot be excluded. Electronically Signed   By: Corrie Mckusick D.O.   On: 12/06/2018 12:58   Dg Chest Port 1 View  Result  Date: 11/28/2018 CLINICAL DATA:  Rales. Vomiting and diarrhea. Weakness. EXAM: PORTABLE CHEST 1 VIEW COMPARISON:  08/30/2017 FINDINGS: Lung volumes are low. Mild left lung base atelectasis. Borderline cardiomegaly, unchanged from prior. Tortuous atherosclerotic thoracic aorta. Pulmonary vasculature is normal. No consolidation, pleural effusion, or pneumothorax. No acute osseous abnormalities are seen. Incidental note of gaseous gastric distention in the upper abdomen. IMPRESSION: Low lung volumes with mild left lung base atelectasis. Electronically Signed   By: Keith Rake M.D.   On: 11/28/2018 23:03   Mr Abdomen Mrcp Moise Boring Contast  Result Date: 12/03/2018 CLINICAL DATA:  Common bile duct obstruction.  Upper abdominal pain. EXAM: MRI ABDOMEN WITHOUT AND WITH CONTRAST (INCLUDING MRCP) TECHNIQUE: Multiplanar multisequence MR imaging of the abdomen was performed both before and after the administration of intravenous contrast. Heavily T2-weighted images of the biliary and pancreatic ducts were obtained, and three-dimensional MRCP images were rendered by post processing. CONTRAST:  6 cc of Gadavist COMPARISON:  414 ultrasound.  12/01/2018 CT. FINDINGS: Mild to moderate motion degradation throughout. Lower chest: Small bilateral pleural effusions with bibasilar atelectasis. Mild cardiomegaly. Hepatobiliary: No suspicious liver lesion. Scattered hepatic cysts or bile duct hamartomas. Gallbladder distension, without stone or acute cholecystitis. Intrahepatic duct dilatation is again identified. The common duct is dilated including at 1.3 cm in the porta hepatis on image 38/4. There are foci of dependent abnormal signal within the mid common duct, including at 7 mm on image 42/14. At the level of the ampulla, there is an equivocal filling defect which is most suspicious on coronal reformats, including on image 8/8. Pancreas: Atrophy and prominence of the pancreatic duct/side branches, likely within normal variation  for age. No acute inflammation. Spleen: Multi septated cystic lesion within the medial spleen measures 4.3 x 3.8 cm. Adrenals/Urinary Tract: Normal adrenal glands. Bilateral renal lesions, primarily felt to represent cysts. Some demonstrate precontrast T1 hyperintensity on series 14, favoring minimally complex cysts. No hydronephrosis. Stomach/Bowel: The stomach is underdistended but appears thick walled, including on series 14. Normal abdominal small bowel loops. Colonic diverticula. Vascular/Lymphatic: Advanced aortic atherosclerosis with ulcerative plaque throughout. No aneurysm. No abdominal adenopathy. Other:  Fluid in the pelvis, suboptimally and incompletely imaged. Musculoskeletal: No acute osseous abnormality. IMPRESSION: 1. Mild to moderate motion degradation. 2. Mid common duct dependent stones, maximally 7 mm. Equivocal distal common duct filling defect for which a larger stone cannot be excluded. Resultant intra and extrahepatic biliary duct dilatation with gallbladder distension.  3. Small bilateral pleural effusions with bibasilar atelectasis. 4. Gastric underdistention with apparent wall thickening. Cannot exclude gastritis. 5.  Aortic Atherosclerosis (ICD10-I70.0). 6. 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 Electronically Signed   By: Abigail Miyamoto M.D.   On: 12/03/2018 16:49   US Abdomen Limited Ruq  Result Date: 12/03/2018 CLINICAL DATA:  Dilated common bile duct and common bile duct. EXAM: ULTRASOUND ABDOMEN LIMITED RIGHT UPPER QUADRANT COMPARISON:  CT 12/01/2018 FINDINGS: Gallbladder: No gallstones or wall thickening visualized. There is a small amount of sludge in the gallbladder. The gallbladder is distended with a diameter of 5.2 cm. Common bile duct: Diameter: 13 mm. Liver: No focal lesion identified. Within normal limits in parenchymal echogenicity. Biliary dilatation within the hepatic parenchyma is noted. Portal  vein is patent on color Doppler imaging with normal direction of blood flow towards the liver. Additional findings: A right pleural effusion is noted. IMPRESSION: Stable gallbladder dilatation. Common bile duct is dilated. Intrahepatic biliary dilatation is also noted. Findings suggest common bile duct obstruction. MRCP may be helpful. Incidental right pleural effusion. Electronically Signed   By: Marybelle Killings M.D.   On: 12/03/2018 07:46    Microbiology: Recent Results (from the past 240 hour(s))  Culture, Urine     Status: Abnormal   Collection Time: 12/01/18  9:12 AM  Result Value Ref Range Status   Specimen Description URINE, CLEAN CATCH  Final   Special Requests   Final    NONE Performed at [facility], [address], [place], [place] [address]    Culture (A)  Final    >=100,000 COLONIES/mL ESCHERICHIA COLI 50,000 COLONIES/mL KLEBSIELLA PNEUMONIAE    Report Status 12/04/2018 FINAL  Final   Organism ID, Bacteria ESCHERICHIA COLI (A)  Final   Organism ID, Bacteria KLEBSIELLA PNEUMONIAE (A)  Final      Susceptibility   Escherichia coli - MIC*    AMPICILLIN 8 SENSITIVE Sensitive     CEFAZOLIN <=4 SENSITIVE Sensitive     CEFTRIAXONE <=1 SENSITIVE Sensitive     CIPROFLOXACIN <=0.25 SENSITIVE Sensitive     GENTAMICIN <=1 SENSITIVE Sensitive     IMIPENEM <=0.25 SENSITIVE Sensitive     NITROFURANTOIN <=16 SENSITIVE Sensitive     TRIMETH/SULFA <=20 SENSITIVE Sensitive     AMPICILLIN/SULBACTAM 4 SENSITIVE Sensitive     PIP/TAZO <=4 SENSITIVE Sensitive     Extended ESBL NEGATIVE Sensitive     * >=100,000 COLONIES/mL ESCHERICHIA COLI   Klebsiella pneumoniae - MIC*    AMPICILLIN >=32 RESISTANT Resistant     CEFAZOLIN <=4 SENSITIVE Sensitive     CEFTRIAXONE <=1 SENSITIVE Sensitive     CIPROFLOXACIN <=0.25 SENSITIVE Sensitive     GENTAMICIN <=1 SENSITIVE Sensitive     IMIPENEM <=0.25 SENSITIVE Sensitive     NITROFURANTOIN 64 INTERMEDIATE Intermediate     TRIMETH/SULFA <=20  SENSITIVE Sensitive     AMPICILLIN/SULBACTAM 16 INTERMEDIATE Intermediate     PIP/TAZO 16 SENSITIVE Sensitive     Extended ESBL NEGATIVE Sensitive     * 50,000 COLONIES/mL KLEBSIELLA PNEUMONIAE  C difficile quick scan w PCR reflex     Status: None   Collection Time: 12/04/18  7:00 AM  Result Value Ref Range Status   C Diff antigen NEGATIVE NEGATIVE Final   C Diff toxin NEGATIVE NEGATIVE Final   C Diff interpretation No C. difficile detected.  Final    Comment: Performed at [facility], [address] [place], [place] [address]     Labs: Basic Metabolic Panel: Recent Labs  Lab 12/04/18 0803 12/05/18 0345 12/06/18 0302 12/07/18 0238 12/09/18 0254  NA 142 136 140 140 136  K 3.4* 4.0 3.8 3.6 4.0  CL 107 108 111 107 107  CO2 21* 18* 20* 22 21*  GLUCOSE 91 98 94 110* 110*  BUN 15 15 15 14 17   CREATININE 1.08* 1.03* 1.05* 1.36* 1.04*  CALCIUM 8.8* 8.4* 8.2* 8.7* 8.8*  MG 1.6* 2.4 2.0 1.8 1.9  PHOS <1.0* 2.0* 3.7 4.3  --    Liver Function Tests: Recent Labs  Lab 12/04/18 0803 12/05/18 0345  AST 19 20  ALT 11 11  ALKPHOS 66 56  BILITOT 0.7 0.6  PROT 5.7* 5.2*  ALBUMIN 2.9* 2.7*   No results for input(s): LIPASE, AMYLASE in the last 168 hours. No results for input(s): AMMONIA in the last 168 hours. CBC: Recent Labs  Lab 12/03/18 0350 12/04/18 0803 12/05/18 0345 12/06/18 0302 12/07/18 0238 12/09/18 0254  WBC 3.9* 5.6 5.4 4.9 4.4  --   NEUTROABS 2.0 3.5 3.1  --   --   --   HGB 9.3* 10.2* 9.3* 8.8* 8.7* 9.3*  HCT 28.9* 31.0* 29.3* 27.6* 27.2* 29.7*  MCV 89.2 89.6 90.2 91.7 92.5  --   PLT 134* 183 197 206 221  --    Cardiac Enzymes: No results for input(s): CKTOTAL, CKMB, CKMBINDEX, TROPONINI in the last 168 hours. BNP: BNP (last 3 results) No results for input(s): BNP in the last 8760 hours.  ProBNP (last 3 results) No results for input(s): PROBNP in the last 8760 hours.  CBG: No results for input(s): GLUCAP in the last 168  hours.     Signed:  Irine Seal MD.  [facility] 12/09/2018, 12:37 PM

## 2018-12-10 MED ORDER — HYDROCORTISONE (PERIANAL) 2.5 % EX CREA
1.0000 "application " | TOPICAL_CREAM | Freq: Four times a day (QID) | CUTANEOUS | Status: DC | PRN
  Administered 2018-12-10: 1 via TOPICAL
  Filled 2018-12-10 (×2): qty 28.35

## 2018-12-10 NOTE — TOC Progression Note (Addendum)
Transition of Care [name]) - Progression Note    Patient Details  Name: [name] MRN: [id number] Date of Birth: 10-02-1920  Transition of [name] CM/SW [place], [place] Phone Number: 12/10/2018, 10:29 AM  Clinical Narrative:    4:27pm- Pt daughter [name] has chosen [name], [facility] have a bed for pt tomorrow. Have paged MD and will leave hand off for CSW.  4:06pm- Spoke with pt daughter, she is understandably frustrated by lack of options, still does not want [facility] and prefers SNF for pt, [name]. She is aware pt medically stable for d/c and if either has a bed she will d/c first thing in the morning as SNF pharmacies are now closed for the day.   2:53pm- Pt daughter emailed with other offers [name] reviewing referral.   10:29am- Spoke with pt daughter and she is amenable to [name], however CSW called and there are no current beds at [name] until next week, CSW will reach out to other pending offers.   Expected Discharge Plan: Skilled Nursing Facility Barriers to Discharge: No Barriers Identified  Expected Discharge Plan and Services Expected Discharge Plan: [name] In-house Referral: [name] Discharge Planning Services: CM Consult Post Acute Care Choice: [name] Living arrangements for the past 2 months: Single Family Home Expected Discharge Date: 12/09/18                   HH Arranged: RN, PT [place] Agency: [name]   Social Determinants of Health (SDOH) Interventions    Readmission Risk Interventions No flowsheet data found.

## 2018-12-10 NOTE — Progress Notes (Signed)
  Speech Language Pathology Treatment: Dysphagia  Patient Details Name: [name] MRN: [id number] DOB: May 11, 1921 Today's Date: 12/10/2018 Time: 5868-2574 SLP Time Calculation (min) (ACUTE ONLY): 10 min  Assessment / Plan / Recommendation Clinical Impression  Session focused on education of swallow precautions as well as discussion and determination of least restrictive oral diet. Per patient report and chart review, patient has been tolerating Dys 3 (mech soft), thin liquids diet without any overt s/s of aspiration or penetration. Patient reports that she feels comfortable and enjoys food on current diet and as she continues to be weak, will not seek out trials of upgrade solids (regular solids). Plan for patient to discharge to SNF and diet toleration and upgrades can be completed there as patient's strength improves. SLP will sign off at this time.     HPI HPI: Pt is a 85 y/o female admitted secondary to nausea, vomitting, diarrhea and weakness. Found to be dehydrated and with an AKI and to have Gastroenteritis. PMH including but not limited to hypertension, hypothyroidism, GERD, hyperlipidemia, paroxysmal SVT.       SLP Plan  Discharge SLP treatment due to (comment);All goals met       Recommendations  Diet recommendations: Dysphagia 3 (mechanical soft);Thin liquid Liquids provided via: Straw;Cup Medication Administration: Whole meds with liquid Supervision: Patient able to self feed Compensations: Minimize environmental distractions;Slow rate;Small sips/bites Postural Changes and/or Swallow Maneuvers: Seated upright 90 degrees                Oral Care Recommendations: Oral care BID Follow up Recommendations: Skilled Nursing facility SLP Visit Diagnosis: Dysphagia, unspecified (R13.10) Plan: Discharge SLP treatment due to (comment);All goals met       GO                [name] 12/10/2018, 11:53 AM     Marissa Baller, MA, CCC-SLP Speech  Therapy [facility] Acute Rehab Pager: [phone]

## 2018-12-10 NOTE — NC FL2 (Addendum)
[place] LEVEL OF CARE SCREENING TOOL     IDENTIFICATION  Patient Name: [name] Birthdate: 12-26-1920 Sex: female Admission Date (Current Location): 11/28/2018  [facility] and [place] Number:  Herbalist and Address:  [facility] [place], [place] [address] [place], [place] [address]      Provider Number: [id number]  Attending Physician Name and Address:  Eugenie Filler, MD  Relative Name and Phone Number:  [place]    Current Level of Care: Hospital Recommended Level of Care: [place] Prior Approval Number:    Date Approved/Denied:   PASRR Number: [id number]  Discharge Plan: SNF    Current Diagnoses: Patient Active Problem List   Diagnosis Date Noted  . E. coli UTI 12/04/2018  . UTI due to Klebsiella species 12/04/2018  . Abnormal magnetic resonance cholangiopancreatography (MRCP) 12/04/2018  . Hypophosphatemia 12/04/2018  . Hypokalemia 12/04/2018  . Proctitis 12/04/2018  . Hypomagnesemia 12/04/2018  . AKI (acute kidney injury) ([place]) 11/28/2018  . Viral gastroenteritis 11/28/2018  . Generalized weakness 11/28/2018  . Chronic anemia 11/28/2018  . Thrombocytopenia ([place]) 11/28/2018  . Chronic bilateral low back pain 10/31/2016  . Closed subcapital fracture of femur, left, initial encounter ([place]) 10/16/2016  . Peripheral vascular disease ([place]) 10/16/2016  . Essential hypertension 10/16/2016  . Hypothyroidism, acquired 10/16/2016  . Dry eye 04/21/2015  . H/O surgical procedure 05/25/2014  . 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 Pseudoaphakia 04/10/2014  . Acute appendicitis 08/22/2013    Orientation RESPIRATION BLADDER Height & Weight     Self, Time, Place  Normal Incontinent, External catheter Weight: 115 lb 4.8 oz (52.3 kg) Height:  4\' 11"  (149.9 cm)  BEHAVIORAL SYMPTOMS/MOOD NEUROLOGICAL BOWEL NUTRITION STATUS      Incontinent Diet(DSY 3 mechanical soft diet; thin  fluids)  AMBULATORY STATUS COMMUNICATION OF NEEDS Skin   Extensive Assist Verbally Other (Comment)(MASD on buttocks)                       Personal Care Assistance Level of Assistance  Bathing, Feeding, Dressing Bathing Assistance: Maximum assistance Feeding assistance: Limited assistance Dressing Assistance: Maximum assistance     Functional Limitations Info  Sight, Hearing, Speech Sight Info: Adequate Hearing Info: Adequate Speech Info: Adequate    SPECIAL CARE FACTORS FREQUENCY  PT (By licensed PT), OT (By licensed OT), Speech therapy     PT Frequency: 5x week OT Frequency: 5x week     Speech Therapy Frequency: 2x      Contractures Contractures Info: Not present    Additional Factors Info  Code Status, Allergies, Psychotropic Code Status Info: DNR Allergies Info: CODEINE, GABAPENTIN, OTHER  Psychotropic Info: sertraline (ZOLOFT) tablet 100 mg daily at bedtime PO         Current Medications (12/10/2018):  This is the current hospital active medication list Current Facility-Administered Medications  Medication Dose Route Frequency Provider Last Rate Last Dose  . acetaminophen (TYLENOL) tablet 650 mg  650 mg Oral Q6H PRN Shela Leff, MD   650 mg at 12/08/18 2244   Or  . acetaminophen (TYLENOL) suppository 650 mg  650 mg Rectal Q6H PRN Shela Leff, MD      . alum & mag hydroxide-simeth (MAALOX/MYLANTA) 200-200-20 MG/5ML suspension 15 mL  15 mL Oral Q4H PRN Starla Link, Kshitiz, MD      . benzonatate (TESSALON) capsule 100 mg  100 mg Oral TID PRN Eugenie Filler, MD   100 mg at 12/06/18  1240  . brimonidine (ALPHAGAN) 0.15 % ophthalmic solution 1 drop  1 drop Left Eye TID Shela Leff, MD   1 drop at 12/10/18 1001  . clopidogrel (PLAVIX) tablet 75 mg  75 mg Oral Derrek Gu, MD   75 mg at 12/10/18 0956  . diltiazem (CARDIZEM CD) 24 hr capsule 120 mg  120 mg Oral Daily Starla Link, Kshitiz, MD   120 mg at 12/10/18 0956  . dorzolamide-timolol  (COSOPT) 22.3-6.8 MG/ML ophthalmic solution 1 drop  1 drop Left Eye BID Shela Leff, MD   1 drop at 12/10/18 1002  . feeding supplement (ENSURE ENLIVE) (ENSURE ENLIVE) liquid 237 mL  237 mL Oral BID BM Shela Leff, MD   237 mL at 12/10/18 0958  . ferrous sulfate tablet 325 mg  325 mg Oral QPM Shela Leff, MD   325 mg at 12/09/18 1747  . heparin injection 5,000 Units  5,000 Units Subcutaneous Q8H Shela Leff, MD   5,000 Units at 12/10/18 1421  . hydrALAZINE (APRESOLINE) injection 5 mg  5 mg Intravenous Q6H PRN Aline August, MD      . hydrALAZINE (APRESOLINE) tablet 25 mg  25 mg Oral BID Eugenie Filler, MD   25 mg at 12/10/18 0354  . hydrocortisone (ANUSOL-HC) 2.5 % rectal cream 1 application  1 application Topical QID PRN Eugenie Filler, MD   1 application at 65/68/12 1424  . latanoprost (XALATAN) 0.005 % ophthalmic solution 1 drop  1 drop Both Eyes QHS Shela Leff, MD   1 drop at 12/09/18 2226  . levothyroxine (SYNTHROID, LEVOTHROID) tablet 25 mcg  25 mcg Oral Derrek Gu, MD   25 mcg at 12/09/18 0604  . levothyroxine (SYNTHROID, LEVOTHROID) tablet 50 mcg  50 mcg Oral Derrek Gu, MD   50 mcg at 12/10/18 0957  . lip balm (CARMEX) ointment 1 application  1 application Topical PRN Alekh, Kshitiz, MD      . loperamide (IMODIUM) capsule 2 mg  2 mg Oral BID PRN Eugenie Filler, MD      . loratadine (CLARITIN) tablet 10 mg  10 mg Oral Daily PRN Shela Leff, MD      . metoprolol tartrate (LOPRESSOR) tablet 50 mg  50 mg Oral BID Shela Leff, MD   50 mg at 12/10/18 0957  . ondansetron (ZOFRAN) injection 4 mg  4 mg Intravenous Q6H PRN Shela Leff, MD      . pantoprazole (PROTONIX) EC tablet 40 mg  40 mg Oral BID AC Alekh, Kshitiz, MD   40 mg at 12/10/18 0956  . sertraline (ZOLOFT) tablet 100 mg  100 mg Oral QHS Shela Leff, MD   100 mg at 12/09/18 2227  . sodium bicarbonate tablet 650 mg  650 mg Oral BID  Eugenie Filler, MD   650 mg at 12/10/18 7517     Discharge Medications: Please see discharge summary for a list of discharge medications.  Relevant Imaging Results:  Relevant Lab Results:   Additional Information SN:[id number]  [place], [place]

## 2018-12-10 NOTE — Progress Notes (Signed)
PROGRESS NOTE    [name]  ZOX:[id number] DOB: [birth year] DOA: 11/28/2018 PCP: Wenda Low, MD    Brief Narrative:  85 year old female with history of anemia, hypertension, hypothyroidism, GERD, hyperlipidemia, paroxysmal SVT presented on 11/28/2018 with generalized weakness, diarrhea and vomiting.  She was found to have acute kidney injury with creatinine of 3.25.  She was started on IV fluids.   Assessment & Plan:   Principal Problem:   AKI (acute kidney injury) ([place]) Active Problems:   Viral gastroenteritis   Generalized weakness   Chronic anemia   Thrombocytopenia (HCC)   E. coli UTI   UTI due to Klebsiella species   Abnormal magnetic resonance cholangiopancreatography (MRCP)   Hypophosphatemia   Hypokalemia   Proctitis   Hypomagnesemia  1 acute kidney injury Likely secondary to prerenal azotemia secondary to severe dehydration from GI losses of diarrhea in the setting of ACE inhibitor use. Patient noted to have presented with a creatinine of 3.25 on admission.  Baseline noted to be 1.1 on March 2018.  Renal function has improved with hydration.  Creatinine of 1.04.  IV fluids have been saline locked.  Follow.    2.  Hypokalemia/hypomagnesemia/hypophosphatemia Likely secondary to GI losses.  Potassium repleted and currently at 4.  Phosphorus at 4.3.  Magnesium at 1.9.  Repeat labs in the morning.  Follow.  3.  E. coli/Klebsiella pneumonia UTI Patient was on IV Rocephin and transitioned to oral Omnicef and complete a 5-day course of antibiotic treatment.  No further antibiotics needed.   4.  Probable proctitis/colitis/??  Gastroenteritis GI PCR negative.  LFTs within normal limit.  Abdominal exam is benign.  Lipase noted to be elevated however not consistent with a clinical picture of acute pancreatitis.  CT abdomen and pelvis which was done showed a distended gallbladder with common bile duct approximately 14 mm without bile duct stone noted.  Right upper  quadrant ultrasound showed a dilated common bile duct with intrahepatic duct concern for obstruction.  CT abdomen and pelvis which was done suggestive of proctitis/colitis with diverticulosis without diverticulitis.  C. difficile PCR obtained negative.  Patient given a dose of Imodium 12/02/2018 with some improvement with diarrhea.  Gemfibrozil has been discontinued.  IV Flagyl has been discontinued.  MRCP obtained with mild common duct dependent stones, maximally 7 mm.  Equivocal distal common duct filling defect for which larger stone cannot be excluded.  Resultant intra-and extrahepatic biliary duct dilatation with gallbladder distention.  Small bilateral pleural effusions with bibasilar atelectasis.  Gastric underdistention with apparent wall thickening cannot exclude gastritis.  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  Patient was on IV Rocephin and has been transitioned to [facility] to treat for total of 5 days.  Afebrile. Due to abnormal MRCP, GI was consulted.  Patient noted to have had 3 bowel movements on 12/04/2018.  Patient was placed on scheduled Imodium which has been changed to as needed.  Patient with no bowel movement today per patient. GI not planning a flexible sigmoidoscopy at this time and recommending Imodium 2 mg twice daily as needed.  Follow.  5.  Choledocholithiasis Noted on MRCP.  Patient with normal LFTs.  Patient asymptomatic.  GI consulted and discussed ERCP with her daughter and both she and GI MD felt was that ERCP was not needed at this time given the stones were not causing any symptoms of obstruction.  Per GI if patient should later develop issues with a stone  such as bowel obstruction, cholangitis or pancreatitis would rediscuss ERCP at that time.  Patient was on IV Rocephin and has been transitioned to [facility].  Antibiotics have been discontinued and patient received a total of 5 days of antibiotic  treatment.  No further antibiotics needed at this time.  6.  Non-anion gap metabolic acidosis Secondary to diarrhea.  Improved.  Bicarb drip has been discontinued.  Chest x-ray negative for any acute infiltrates.  Unlikely patient has COVID-19.  Isolation has been discontinued.  PT recommending SNF placement.  Continue oral bicarb tablets.  Follow.  7.  Anemia of chronic disease Hemoglobin stable at 9.3. Follow.  8.  Hypertension BP initially improved with addition of hydralazine to home regimen of metoprolol and Cardizem.  Hydralazine increased back to twice daily.  Follow and uptitrate as needed for better blood pressure control.  Follow.  9.  Peripheral vascular disease Continue Plavix.  10.  Hypothyroidism Continue Synthroid.  Outpatient follow-up.  11.  Gastroesophageal reflux disease PPI.    12.  Depression Continue Zoloft.   13.  Bibasilar crackles Chest x-ray consistent with developing volume overload versus atelectasis.  Patient given 2 doses of IV Lasix with clinical improvement.  Urine output not properly recorded.  2D echo was ordered however canceled by cardiology due to current [place] pandemic and recommendations for minimizing exposure to patient/staff.  Bibasilar crackles resolved.  Outpatient follow-up.     DVT prophylaxis: Heparin Code Status: DNR Family Communication: No family at bedside.  Disposition Plan: Skilled nursing facility when bed available.    Consultants:   Gastroenterolog: Dr. Hilarie Fredrickson 12/04/2018    Procedures:   CT abdomen and pelvis 12/01/2018  Chest x-ray 11/28/2018  MRCP 12/03/2018  Renal ultrasound 11/28/2018  Right upper quadrant ultrasound 12/03/2018  Antimicrobials:   IV Rocephin 12/01/2018>>>> 12/05/2018  IV Flagyl 12/02/2018>>> 12/03/2018  Oral Vantin 12/06/2018 >>>> 12/06/2018   Subjective: Patient laying in bed.  Denies any chest pain.  No shortness of breath.  Ready to be discharged.   Objective: Vitals:   12/09/18 1519  12/09/18 2154 12/10/18 0500 12/10/18 0549  BP: (!) 180/77 (!) 159/61  (!) 167/65  Pulse: 62 71  65  Resp: 16 16  16   Temp: 98.3 F (36.8 C) 97.6 F (36.4 C)  97.9 F (36.6 C)  TempSrc:  Oral  Oral  SpO2: 97% 96%  97%  Weight:   52.3 kg   Height:        Intake/Output Summary (Last 24 hours) at 12/10/2018 1111 Last data filed at 12/10/2018 1023 Gross per 24 hour  Intake 360 ml  Output 200 ml  Net 160 ml   Filed Weights   12/06/18 0500 12/07/18 0541 12/10/18 0500  Weight: 53.2 kg 52.3 kg 52.3 kg    Examination:  General exam: NAD Respiratory system: Lungs clear to auscultation bilaterally.  No wheezes, no crackles, no rhonchi.  Normal respiratory effort.  Cardiovascular system: Regular rate and rhythm no murmurs rubs or gallops.  No JVD.  No lower extremity edema. Gastrointestinal system: Abdomen is soft, nontender, nondistended, positive bowel sounds.  No rebound.  No guarding.  Central nervous system: Alert and oriented. No focal neurological deficits. Extremities: Symmetric 5 x 5 power. Skin: No rashes, lesions or ulcers Psychiatry: Judgement and insight appear normal. Mood & affect appropriate.     Data Reviewed: I have personally reviewed following labs and imaging studies  CBC: Recent Labs  Lab 12/04/18 0803 12/05/18 0345 12/06/18 0302 12/07/18 6073 12/09/18 7106  WBC 5.6 5.4 4.9 4.4  --   NEUTROABS 3.5 3.1  --   --   --   HGB 10.2* 9.3* 8.8* 8.7* 9.3*  HCT 31.0* 29.3* 27.6* 27.2* 29.7*  MCV 89.6 90.2 91.7 92.5  --   PLT 183 197 206 221  --    Basic Metabolic Panel: Recent Labs  Lab 12/04/18 0803 12/05/18 0345 12/06/18 0302 12/07/18 0238 12/09/18 0254  NA 142 136 140 140 136  K 3.4* 4.0 3.8 3.6 4.0  CL 107 108 111 107 107  CO2 21* 18* 20* 22 21*  GLUCOSE 91 98 94 110* 110*  BUN 15 15 15 14 17   CREATININE 1.08* 1.03* 1.05* 1.36* 1.04*  CALCIUM 8.8* 8.4* 8.2* 8.7* 8.8*  MG 1.6* 2.4 2.0 1.8 1.9  PHOS <1.0* 2.0* 3.7 4.3  --    GFR: Estimated  Creatinine Clearance: 23.9 mL/min (A) (by C-G formula based on SCr of 1.04 mg/dL (H)). Liver Function Tests: Recent Labs  Lab 12/04/18 0803 12/05/18 0345  AST 19 20  ALT 11 11  ALKPHOS 66 56  BILITOT 0.7 0.6  PROT 5.7* 5.2*  ALBUMIN 2.9* 2.7*   No results for input(s): LIPASE, AMYLASE in the last 168 hours. No results for input(s): AMMONIA in the last 168 hours. Coagulation Profile: No results for input(s): INR, PROTIME in the last 168 hours. Cardiac Enzymes: No results for input(s): CKTOTAL, CKMB, CKMBINDEX, TROPONINI in the last 168 hours. BNP (last 3 results) No results for input(s): PROBNP in the last 8760 hours. HbA1C: No results for input(s): HGBA1C in the last 72 hours. CBG: No results for input(s): GLUCAP in the last 168 hours. Lipid Profile: No results for input(s): CHOL, HDL, LDLCALC, TRIG, CHOLHDL, LDLDIRECT in the last 72 hours. Thyroid Function Tests: No results for input(s): TSH, T4TOTAL, FREET4, T3FREE, THYROIDAB in the last 72 hours. Anemia Panel: No results for input(s): VITAMINB12, FOLATE, FERRITIN, TIBC, IRON, RETICCTPCT in the last 72 hours. Sepsis Labs: No results for input(s): PROCALCITON, LATICACIDVEN in the last 168 hours.  Recent Results (from the past 240 hour(s))  Culture, Urine     Status: Abnormal   Collection Time: 12/01/18  9:12 AM  Result Value Ref Range Status   Specimen Description URINE, CLEAN CATCH  Final   Special Requests   Final    NONE Performed at [facility], [address], [place], [place] [address]    Culture (A)  Final    >=100,000 COLONIES/mL ESCHERICHIA COLI 50,000 COLONIES/mL KLEBSIELLA PNEUMONIAE    Report Status 12/04/2018 FINAL  Final   Organism ID, Bacteria ESCHERICHIA COLI (A)  Final   Organism ID, Bacteria KLEBSIELLA PNEUMONIAE (A)  Final      Susceptibility   Escherichia coli - MIC*    AMPICILLIN 8 SENSITIVE Sensitive     CEFAZOLIN <=4 SENSITIVE Sensitive     CEFTRIAXONE <=1 SENSITIVE Sensitive      CIPROFLOXACIN <=0.25 SENSITIVE Sensitive     GENTAMICIN <=1 SENSITIVE Sensitive     IMIPENEM <=0.25 SENSITIVE Sensitive     NITROFURANTOIN <=16 SENSITIVE Sensitive     TRIMETH/SULFA <=20 SENSITIVE Sensitive     AMPICILLIN/SULBACTAM 4 SENSITIVE Sensitive     PIP/TAZO <=4 SENSITIVE Sensitive     Extended ESBL NEGATIVE Sensitive     * >=100,000 COLONIES/mL ESCHERICHIA COLI   Klebsiella pneumoniae - MIC*    AMPICILLIN >=32 RESISTANT Resistant     CEFAZOLIN <=4 SENSITIVE Sensitive     CEFTRIAXONE <=1 SENSITIVE Sensitive  CIPROFLOXACIN <=0.25 SENSITIVE Sensitive     GENTAMICIN <=1 SENSITIVE Sensitive     IMIPENEM <=0.25 SENSITIVE Sensitive     NITROFURANTOIN 64 INTERMEDIATE Intermediate     TRIMETH/SULFA <=20 SENSITIVE Sensitive     AMPICILLIN/SULBACTAM 16 INTERMEDIATE Intermediate     PIP/TAZO 16 SENSITIVE Sensitive     Extended ESBL NEGATIVE Sensitive     * 50,000 COLONIES/mL KLEBSIELLA PNEUMONIAE  C difficile quick scan w PCR reflex     Status: None   Collection Time: 12/04/18  7:00 AM  Result Value Ref Range Status   C Diff antigen NEGATIVE NEGATIVE Final   C Diff toxin NEGATIVE NEGATIVE Final   C Diff interpretation No C. difficile detected.  Final    Comment: Performed at [facility], [place] [address] [place],  [address]         Radiology Studies: No results found.      Scheduled Meds: . brimonidine  1 drop Left Eye TID  . clopidogrel  75 mg Oral QODAY  . diltiazem  120 mg Oral Daily  . dorzolamide-timolol  1 drop Left Eye BID  . feeding supplement (ENSURE ENLIVE)  237 mL Oral BID BM  . ferrous sulfate  325 mg Oral QPM  . heparin  5,000 Units Subcutaneous Q8H  . hydrALAZINE  25 mg Oral BID  . latanoprost  1 drop Both Eyes QHS  . levothyroxine  25 mcg Oral QODAY  . levothyroxine  50 mcg Oral QODAY  . metoprolol tartrate  50 mg Oral BID  . pantoprazole  40 mg Oral BID AC  . sertraline  100 mg Oral QHS  . sodium bicarbonate  650 mg Oral BID    Continuous Infusions:    LOS: 11 days    Time spent: 35 minutes    Irine Seal, MD [facility]  If 7PM-7AM, please contact night-coverage [email or web address] 12/10/2018, 11:11 AM

## 2018-12-11 DIAGNOSIS — G8929 Other chronic pain: Secondary | ICD-10-CM | POA: Diagnosis not present

## 2018-12-11 DIAGNOSIS — B961 Klebsiella pneumoniae [K. pneumoniae] as the cause of diseases classified elsewhere: Secondary | ICD-10-CM | POA: Diagnosis not present

## 2018-12-11 DIAGNOSIS — Z96642 Presence of left artificial hip joint: Secondary | ICD-10-CM | POA: Diagnosis not present

## 2018-12-11 DIAGNOSIS — I10 Essential (primary) hypertension: Secondary | ICD-10-CM | POA: Diagnosis not present

## 2018-12-11 DIAGNOSIS — J9811 Atelectasis: Secondary | ICD-10-CM | POA: Diagnosis not present

## 2018-12-11 DIAGNOSIS — A048 Other specified bacterial intestinal infections: Secondary | ICD-10-CM | POA: Diagnosis not present

## 2018-12-11 DIAGNOSIS — E876 Hypokalemia: Secondary | ICD-10-CM | POA: Diagnosis not present

## 2018-12-11 DIAGNOSIS — E860 Dehydration: Secondary | ICD-10-CM | POA: Diagnosis not present

## 2018-12-11 DIAGNOSIS — A084 Viral intestinal infection, unspecified: Secondary | ICD-10-CM | POA: Diagnosis not present

## 2018-12-11 DIAGNOSIS — R4182 Altered mental status, unspecified: Secondary | ICD-10-CM | POA: Diagnosis not present

## 2018-12-11 DIAGNOSIS — F329 Major depressive disorder, single episode, unspecified: Secondary | ICD-10-CM | POA: Diagnosis not present

## 2018-12-11 DIAGNOSIS — R197 Diarrhea, unspecified: Secondary | ICD-10-CM | POA: Diagnosis not present

## 2018-12-11 DIAGNOSIS — R112 Nausea with vomiting, unspecified: Secondary | ICD-10-CM | POA: Diagnosis not present

## 2018-12-11 DIAGNOSIS — R5381 Other malaise: Secondary | ICD-10-CM | POA: Diagnosis not present

## 2018-12-11 DIAGNOSIS — I471 Supraventricular tachycardia: Secondary | ICD-10-CM | POA: Diagnosis not present

## 2018-12-11 DIAGNOSIS — Z961 Presence of intraocular lens: Secondary | ICD-10-CM | POA: Diagnosis not present

## 2018-12-11 DIAGNOSIS — K6289 Other specified diseases of anus and rectum: Secondary | ICD-10-CM | POA: Diagnosis not present

## 2018-12-11 DIAGNOSIS — D81819 Biotin-dependent carboxylase deficiency, unspecified: Secondary | ICD-10-CM | POA: Diagnosis not present

## 2018-12-11 DIAGNOSIS — M545 Low back pain: Secondary | ICD-10-CM | POA: Diagnosis not present

## 2018-12-11 DIAGNOSIS — N390 Urinary tract infection, site not specified: Secondary | ICD-10-CM | POA: Diagnosis not present

## 2018-12-11 DIAGNOSIS — F339 Major depressive disorder, recurrent, unspecified: Secondary | ICD-10-CM | POA: Diagnosis not present

## 2018-12-11 DIAGNOSIS — R2689 Other abnormalities of gait and mobility: Secondary | ICD-10-CM | POA: Diagnosis not present

## 2018-12-11 DIAGNOSIS — E872 Acidosis: Secondary | ICD-10-CM | POA: Diagnosis not present

## 2018-12-11 DIAGNOSIS — M810 Age-related osteoporosis without current pathological fracture: Secondary | ICD-10-CM | POA: Diagnosis not present

## 2018-12-11 DIAGNOSIS — M1990 Unspecified osteoarthritis, unspecified site: Secondary | ICD-10-CM | POA: Diagnosis not present

## 2018-12-11 DIAGNOSIS — D649 Anemia, unspecified: Secondary | ICD-10-CM | POA: Diagnosis not present

## 2018-12-11 DIAGNOSIS — I119 Hypertensive heart disease without heart failure: Secondary | ICD-10-CM | POA: Diagnosis not present

## 2018-12-11 DIAGNOSIS — N179 Acute kidney failure, unspecified: Secondary | ICD-10-CM | POA: Diagnosis not present

## 2018-12-11 DIAGNOSIS — K8050 Calculus of bile duct without cholangitis or cholecystitis without obstruction: Secondary | ICD-10-CM | POA: Diagnosis not present

## 2018-12-11 DIAGNOSIS — E782 Mixed hyperlipidemia: Secondary | ICD-10-CM | POA: Diagnosis not present

## 2018-12-11 DIAGNOSIS — L304 Erythema intertrigo: Secondary | ICD-10-CM | POA: Diagnosis not present

## 2018-12-11 DIAGNOSIS — M6281 Muscle weakness (generalized): Secondary | ICD-10-CM | POA: Diagnosis not present

## 2018-12-11 DIAGNOSIS — Z7401 Bed confinement status: Secondary | ICD-10-CM | POA: Diagnosis not present

## 2018-12-11 DIAGNOSIS — B9620 Unspecified Escherichia coli [E. coli] as the cause of diseases classified elsewhere: Secondary | ICD-10-CM | POA: Diagnosis not present

## 2018-12-11 DIAGNOSIS — H04123 Dry eye syndrome of bilateral lacrimal glands: Secondary | ICD-10-CM | POA: Diagnosis not present

## 2018-12-11 DIAGNOSIS — R531 Weakness: Secondary | ICD-10-CM | POA: Diagnosis not present

## 2018-12-11 DIAGNOSIS — M2550 Pain in unspecified joint: Secondary | ICD-10-CM | POA: Diagnosis not present

## 2018-12-11 DIAGNOSIS — L89611 Pressure ulcer of right heel, stage 1: Secondary | ICD-10-CM | POA: Diagnosis not present

## 2018-12-11 DIAGNOSIS — Z66 Do not resuscitate: Secondary | ICD-10-CM | POA: Diagnosis not present

## 2018-12-11 DIAGNOSIS — R1312 Dysphagia, oropharyngeal phase: Secondary | ICD-10-CM | POA: Diagnosis not present

## 2018-12-11 DIAGNOSIS — E039 Hypothyroidism, unspecified: Secondary | ICD-10-CM | POA: Diagnosis not present

## 2018-12-11 DIAGNOSIS — H401424 Capsular glaucoma with pseudoexfoliation of lens, left eye, indeterminate stage: Secondary | ICD-10-CM | POA: Diagnosis not present

## 2018-12-11 DIAGNOSIS — K529 Noninfective gastroenteritis and colitis, unspecified: Secondary | ICD-10-CM | POA: Diagnosis not present

## 2018-12-11 DIAGNOSIS — Z9841 Cataract extraction status, right eye: Secondary | ICD-10-CM | POA: Diagnosis not present

## 2018-12-11 LAB — TROPONIN I: Troponin I: <0.03 ng/mL (ref <0.03)

## 2018-12-11 LAB — BASIC METABOLIC PANEL
Anion gap: 10 (ref 5–15)
BUN: 19 mg/dL (ref 8–23)
CO2: 21 mmol/L — ABNORMAL LOW (ref 22–32)
Calcium: 9.7 mg/dL (ref 8.9–10.3)
Chloride: 107 mmol/L (ref 98–111)
Creatinine, Ser: 1 mg/dL (ref 0.44–1.00)
GFR calc Af Amer: 55 mL/min — ABNORMAL LOW (ref >60)
GFR calc non Af Amer: 48 mL/min — ABNORMAL LOW (ref >60)
Glucose, Bld: 92 mg/dL (ref 70–99)
Potassium: 3.9 mmol/L (ref 3.5–5.1)
Sodium: 138 mmol/L (ref 135–145)

## 2018-12-11 LAB — HEMOGLOBIN AND HEMATOCRIT, BLOOD
HCT: 33.2 % — ABNORMAL LOW (ref 36.0–46.0)
Hemoglobin: 10.8 g/dL — ABNORMAL LOW (ref 12.0–15.0)

## 2018-12-11 MED ORDER — AMLODIPINE BESYLATE 5 MG PO TABS
5.0000 mg | ORAL_TABLET | Freq: Every day | ORAL | Status: DC
  Administered 2018-12-11: 5 mg via ORAL
  Filled 2018-12-11: qty 1

## 2018-12-11 MED ORDER — NITROGLYCERIN 0.4 MG SL SUBL: 0.4000 mg | SUBLINGUAL_TABLET | SUBLINGUAL | 0 refills | Status: DC | PRN

## 2018-12-11 MED ORDER — NITROGLYCERIN 0.4 MG SL SUBL
0.4000 mg | SUBLINGUAL_TABLET | SUBLINGUAL | Status: DC | PRN
  Administered 2018-12-11 (×2): 0.4 mg via SUBLINGUAL
  Filled 2018-12-11: qty 1

## 2018-12-11 MED ORDER — AMLODIPINE BESYLATE 5 MG PO TABS: 5.0000 mg | ORAL_TABLET | Freq: Every day | ORAL | 0 refills | Status: DC

## 2018-12-11 MED ORDER — METOPROLOL TARTRATE 50 MG PO TABS
50.0000 mg | ORAL_TABLET | Freq: Once | ORAL | Status: AC
  Administered 2018-12-11: 50 mg via ORAL
  Filled 2018-12-11: qty 1

## 2018-12-11 MED ORDER — HYDRALAZINE HCL 25 MG PO TABS: 50.0000 mg | ORAL_TABLET | Freq: Two times a day (BID) | ORAL | 0 refills | Status: DC

## 2018-12-11 MED ORDER — METOPROLOL TARTRATE 50 MG PO TABS: 50.0000 mg | ORAL_TABLET | Freq: Two times a day (BID) | ORAL | Status: DC

## 2018-12-11 MED ORDER — HYDRALAZINE HCL 50 MG PO TABS
50.0000 mg | ORAL_TABLET | Freq: Two times a day (BID) | ORAL | Status: DC
  Administered 2018-12-11: 50 mg via ORAL
  Filled 2018-12-11: qty 1

## 2018-12-11 NOTE — Progress Notes (Signed)
Pt ambulated down the hall 50 ft, BP 165/63, HR 62, oxygen sat 98% on room air, rr 18, temp 98.1 . no complaints of shortness of breath or chest pain. MD was notify.

## 2018-12-11 NOTE — Progress Notes (Signed)
Pt c/o chest pain, per pt " it hurts". She could not rate her pain or describe it. BP elevated despite PRN Hydralazine 5 mg given earlier. Pt in no acute distress. Opyd, MD notified.

## 2018-12-11 NOTE — Progress Notes (Signed)
EKG completed, nitroglycerin given x 2, when RN was ready to administer third nitro, pt denied pain in ribcage and chest. At this time she c/o pain in her back, so third nitro held and MD notified. Per pt pain started in her back and spread to her chest.  No acute distress noted BP 176/88, HR 72. Will continue to monitor pt.

## 2018-12-11 NOTE — TOC Transition Note (Signed)
Transition of Care [name]) - CM/SW Discharge Note   Patient Details  Name: ELDENA DEDE MRN: [id number] Date of Birth: 1921-04-18  [facility]) CM/SW Contact:  Benard Halsted, LCSW Phone Number: 12/11/2018, 11:52 AM   Clinical Narrative:    [facility] Anticipated DC date: 12/11/18 Family notified: Daughter Transport by: Corey Harold   Per MD patient ready for DC to [place]. RN, patient, patient's family, and facility notified of DC. Discharge Summary and FL2 sent to facility. RN to call report prior to discharge [phone]). DC packet on chart. Ambulance transport requested for patient.   CSW will sign off for now as social work intervention is no longer needed. Please consult [place] again if new needs arise.  Cedric Fishman, LCSW Clinical Social Worker [phone]    Final next level of care: Skilled Nursing Facility Barriers to Discharge: No Barriers Identified   Patient Goals and CMS Choice Patient states their goals for this hospitalization and ongoing recovery are:: I want to go home CMS Medicare.gov Compare Post Acute Care list provided to:: Patient Represenative (must comment)(pt daughter [name]) Choice offered to / list presented to : Adult Children  Discharge Placement   Existing PASRR number confirmed : 12/10/18          Patient [facility] bed at: [facility] Patient to be transferred to facility by: [place] Name of family member notified: pt daughter [name] Patient and family notified of of transfer: 12/11/18  Discharge Plan and Services In-house Referral: [facility] Discharge Planning Services: CM Consult Post Acute Care Choice: Skilled Nursing Facility              HH Arranged: NA [place] Agency: NA   Social Determinants of Health (SDOH) Interventions     Readmission Risk Interventions No flowsheet data found.

## 2018-12-11 NOTE — Progress Notes (Signed)
Spoken with Opyd, MD. Pt denied chest pain at this time, but still c/o back pain. K pad ordered. Pt resting comfortably in bed.

## 2018-12-11 NOTE — TOC Transition Note (Addendum)
Transition of Care [facility]) - CM/SW Discharge Note   Patient Details  Name: [name] MRN: [id number] Date of Birth: [birth year]  Transition of Care [facility]) CM/SW Contact:  Sharin Mons, RN Phone Number: 12/11/2018, 9:53 AM   12/11/2018 @ 11:45 am NCM called and arranged pt pick with [facility] for transportation to [facility].   Clinical Narrative:    Plan is to transition to [facility] today. NCM made daughter [name] [phone]) aware. [facility] to provide transportation to facility. [name] would like for nurse to give her a call once ambulance has arrived on floor to  pick pt up for  transport to SNF. NCM made bedside nurse aware.   Final next level of care: Skilled Nursing Facility Barriers to Discharge: No Barriers Identified   Patient Goals and CMS Choice Patient states their goals for this hospitalization and ongoing recovery are:: I want to go home CMS Medicare.gov Compare Post Acute Care list provided to:: Patient Represenative (must comment)(pt daughter [name]) Choice offered to / list presented to : Adult Children  Discharge Placement   Existing PASRR number confirmed : 12/10/18          Patient chooses bed at: [facility] Patient to be transferred to facility by: [place] Name of family member notified: pt daughter [name] Patient and family notified of of transfer: 12/11/18  Discharge Plan and Services In-house Referral: [facility] Discharge Planning Services: CM Consult Post Acute Care Choice: Skilled Nursing Facility              HH Arranged: NA [place] Agency: NA   Social Determinants of Health (SDOH) Interventions     Readmission Risk Interventions No flowsheet data found.

## 2018-12-11 NOTE — Discharge Summary (Signed)
Physician Discharge Summary  [name] BOF:[id number] DOB: 08/18/1921 DOA: 11/28/2018  PCP: Marissa Low, MD  Admit date: 11/28/2018 Discharge date: 12/11/2018  Admitted From: Home  Disposition:  SNF   Recommendations for Outpatient Follow-up:  1. Please obtain BMP in one week to recheck Bicarb and Cr and K 2. Please obtain CBC in one week to repeat Hgb 3. Please follow up with PCP in 1 week after discharge from SNF 4. Please check BP once daily and titrate BP meds as needed     Home Health: N/A  Equipment/Devices: TBD at SNF  Discharge Condition: Fair  CODE STATUS: DNR Diet recommendation: Dysphagia 3 (mech soft), thin liquids  Brief/Interim Summary: [name] is a 85 y.o. F with hx anemia, HTN, hypothyroidism, and SVT who presented with weakness, N/V/D for several days.  Found in the ER to have AKI with Cr 3.25 mg/dL, admitted and started on IV fluids.       PRINCIPAL HOSPITAL DIAGNOSIS: Acute renal failure    Discharge Diagnoses:  Acute renal failure From diarrhea, hypovolemia in setting of ACEi use.  ACEi stopped, fluids given, Cr resolved to normal by time of discharge.     E. Coli/Klebsiella UTI Treated with adequate course antibiotics.  Diarrhea Gastroenteritis and colitis/proctitis Diarrhea persistent on admission.  Gemfibrozil stopped.  GI panel PCR and Cdiff PCRs negative, abdominal exam benign.  RUQ US showed choledocholithiasis (see below).  CT abdomen suggested proctitis and colitis.  No suspicion for COVID-19.  Treated with Ceftriaxone to Omnicef for total 5 days.  This, combined with Imodium, resolved diarrhea.  Seen by GI re: choledocholithiasis, deferred ERCP.    Asymptomatic choledocholithiasis This was noted incidentally on MRCP.  GI consulted, discussed ERCP with daughter and patient, and felt this should be deferred at patient's age in light of absence of symptoms or complications.  If patient were to develop fever, jaundice (ie  cholangitis) or pancreatitis, ERCP should be revisited.  Non-anion gap metabolic acidosis Resolved with Bicarb supplementation and cessation of diarrhea.  Anemia of chronic disease  Hypertension Peripheral vascular disease BP initially [name] on admission, antihypertensives held.  By time of discharge, her BP was persistently in severe range.  Lisinopril was stopped due to renal failure, risk of recurrent renal failure.  Clonidine was stopped due to risk in this age group.  Metoprolol continued, hydralazine titrated up, diltiazem continued, amlodipine added (dual CCB use, with agents of different class, was felt to be preferable to clonidine or lisinopril, and is known to be safe and effective)  Hypothyroidism  GERD  Depression  Angina Had an episode of chest pain on morning of discharge.  ECG obtained, showed no ST segment or TWave changes.  Troponin normal.  Pain resolved spotnaenously.  Able to ambulate without pain or symptoms.          Discharge Instructions  Discharge Instructions    Diet general   Complete by:  As directed    Discharge instructions   Complete by:  As directed    From [name] and [name], You were admitted for dehydration and kidney failure. Thankfully, your kidneys have improved considerably.  Going forward: STOP taking lisinopril (one of your blood pressure medicines) and also stop taking clonidine (another blood pressure medicine) Instead, start taking amlodipine 5 mg  And increase your hydralazine dose  Have the staff at your rehab facility check your blood pressure daily, and increase your blood pressure medicine regimen as needed every 3-4 days to get your  blood pressure under 150/90 mmHg.   Have your facility check your lab work in 1 week   Increase activity slowly   Complete by:  As directed      Allergies as of 12/11/2018      Reactions   Codeine Other (See Comments)   "I feel like I'm floating"   Gabapentin Other (See  Comments)   Cannot be on for a good length of time- causes a lot of lethargy   Other Other (See Comments)   All pain meds cause lethargy and disorientation      Medication List    STOP taking these medications   cloNIDine 0.1 MG tablet Commonly known as:  CATAPRES   lisinopril 20 MG tablet Commonly known as:  ZESTRIL   omeprazole 20 MG capsule Commonly known as:  PRILOSEC Replaced by:  pantoprazole 40 MG tablet   polyethylene glycol 17 g packet Commonly known as:  MIRALAX / GLYCOLAX   senna-docusate 8.6-50 MG tablet Commonly known as:  Senokot-S     TAKE these medications   acetaminophen 500 MG tablet Commonly known as:  TYLENOL Take 2 tablets (1,000 mg total) by mouth 3 (three) times daily. X 3 DAYS What changed:    when to take this  additional instructions   amLODipine 5 MG tablet Commonly known as:  NORVASC Take 1 tablet (5 mg total) by mouth daily. Start taking on:  December 12, 2018   benzonatate 100 MG capsule Commonly known as:  TESSALON Take 1 capsule (100 mg total) by mouth 3 (three) times daily as needed for cough.   bimatoprost 0.01 % Soln Commonly known as:  LUMIGAN Place 1 drop into both eyes at bedtime.   BIOTIN PO Take 1 tablet by mouth daily.   bisacodyl 10 MG suppository Commonly known as:  DULCOLAX Place 1 suppository (10 mg total) rectally daily as needed for moderate constipation.   brimonidine 0.15 % ophthalmic solution Commonly known as:  ALPHAGAN Place 1 drop into the left eye 3 (three) times daily.   CALCIUM 600+D3 PO Take 1 tablet by mouth 2 (two) times daily.   clopidogrel 75 MG tablet Commonly known as:  PLAVIX Take 75 mg by mouth every other day.   diltiazem 120 MG 24 hr capsule Commonly known as:  CARDIZEM CD Take 120 mg by mouth at bedtime.   dorzolamide-timolol 22.3-6.8 MG/ML ophthalmic solution Commonly known as:  COSOPT Place 1 drop into the left eye 2 (two) times daily.   feeding supplement (ENSURE ENLIVE)  Liqd Take 237 mLs by mouth 2 (two) times daily between meals.   ferrous sulfate 325 (65 FE) MG EC tablet Take 325 mg by mouth every evening.   fexofenadine 180 MG tablet Commonly known as:  ALLEGRA Take 180 mg by mouth daily.   Fish Oil 1000 MG Caps Take 1,000 mg by mouth 2 (two) times daily.   gemfibrozil 600 MG tablet Commonly known as:  LOPID Take 600 mg by mouth 2 (two) times daily before a meal.   hydrALAZINE 25 MG tablet Commonly known as:  APRESOLINE Take 2 tablets (50 mg total) by mouth 2 (two) times a day.   levothyroxine 50 MCG tablet Commonly known as:  SYNTHROID Take 25-50 mcg by mouth See admin instructions. Take 25 mcg by mouth in the morning before breakfast, alternating with 50 mcg (every other morning)   loperamide 2 MG capsule Commonly known as:  IMODIUM Take 2-4 mg by mouth as needed for diarrhea or loose  stools. What changed:  Another medication with the same name was removed. Continue taking this medication, and follow the directions you see here.   metoprolol tartrate 50 MG tablet Commonly known as:  LOPRESSOR Take 50 mg by mouth 2 (two) times daily.   nitroGLYCERIN 0.4 MG SL tablet Commonly known as:  NITROSTAT Place 1 tablet (0.4 mg total) under the tongue every 5 (five) minutes as needed for chest pain.   pantoprazole 40 MG tablet Commonly known as:  PROTONIX Take 1 tablet (40 mg total) by mouth 2 (two) times daily before a meal. Take 2 times daily for 2 weeks, then 1 tablet daily/ Replaces:  omeprazole 20 MG capsule   sertraline 100 MG tablet Commonly known as:  ZOLOFT Take 100 mg by mouth at bedtime.            Durable Medical Equipment  (From admission, onward)         Start     Ordered   12/02/18 1412  For home use only DME Walker rolling  Once    Question:  Patient needs a walker to treat with the following condition  Answer:  Weakness   12/02/18 1412          Contact information for follow-up providers    [facility] Follow up.   Why:  rolling walker will be delivered to bedside prior to discharge       [facility] SNF Follow up.            Contact information for after-discharge care    Destination    [facility] .   Service:  Skilled Nursing Contact information: [address] [place] [phone]                 Allergies  Allergen Reactions  . Codeine Other (See Comments)    "I feel like I'm floating"   . Gabapentin Other (See Comments)    Cannot be on for a good length of time- causes a lot of lethargy  . Other Other (See Comments)    All pain meds cause lethargy and disorientation    Consultations:  Gastroenterology   Procedures/Studies: Ct Abdomen Pelvis Wo Contrast  Result Date: 12/01/2018 CLINICAL DATA:  Abdominal pain. EXAM: CT ABDOMEN AND PELVIS WITHOUT CONTRAST TECHNIQUE: Multidetector CT imaging of the abdomen and pelvis was performed following the standard protocol without IV contrast. COMPARISON:  CT abdomen dated 08/22/2013. FINDINGS: Lower chest: Bibasilar pleural effusions, RIGHT greater than LEFT, incompletely imaged with associated compressive atelectasis. Hepatobiliary: Gallbladder is prominently distended. No focal liver abnormality. Common bile duct is distended to approximately 14 mm. No bile duct stone seen. Pancreas: Unremarkable. No pancreatic ductal dilatation or surrounding inflammatory changes. Spleen: Splenic cyst versus hemangioma, stable. Otherwise unremarkable. Adrenals/Urinary Tract: Adrenal glands appear normal. 3 mm nonobstructing LEFT renal stone. No hydronephrosis bilaterally. No ureteral or bladder calculi identified. Bladder appears normal. Stomach/Bowel: Extensive diverticulosis of the descending and sigmoid colon but no focal inflammatory changes seen to confirm an acute diverticulitis. No dilated large or small bowel loops. No convincing evidence of active bowel wall inflammation.  Questionable thickening of the walls of the rectum. Surgical changes of previous appendectomy. Stomach is unremarkable, partially decompressed. Vascular/Lymphatic: Aortic atherosclerosis. No enlarged lymph nodes seen. Reproductive: Status post hysterectomy. No adnexal masses. Other: Small amount of free fluid in the pelvis. No other free fluid. No abscess collection seen. No free intraperitoneal air. Musculoskeletal: No acute or suspicious  osseous finding. Degenerative spondylosis of the slightly scoliotic thoracolumbar spine, mild to moderate in degree. Compression fracture deformity of the L1 vertebral body, 75-80% compressed anteriorly/centrally with minimal retropulsion, chronic based on appearance, increased compared to interval lumbar spine CT of 10/27/2016. Ill-defined fluid/edema within the subcutaneous soft tissues of the lower abdomen and pelvis suggesting some degree of anasarca. IMPRESSION: 1. Gallbladder is markedly distended. Common bile duct is distended to approximately 14 mm. No bile duct stone seen. Recommend correlation with liver function tests. Would also consider right upper quadrant ultrasound for further characterization. 2. Colonic diverticulosis without evidence of acute diverticulitis. 3. Questionable thickening of the walls of the rectosigmoid colon, raising the possibility of proctitis/colitis of infectious or inflammatory nature. 4. Small amount of free fluid in the pelvis. No abscess collection seen. No free intraperitoneal air. 5. Bibasilar pleural effusions, right greater than left, incompletely imaged, with associated compressive atelectasis. 6. Ill-defined fluid/edema within the subcutaneous soft tissues of the pelvis suggesting some degree of anasarca. 7. Additional chronic/incidental findings detailed above. Aortic Atherosclerosis (ICD10-I70.0). Electronically Signed   By: Franki Cabot M.D.   On: 12/01/2018 16:04   US Renal  Result Date: 11/28/2018 CLINICAL DATA:  Initial  evaluation for acute renal injury. EXAM: RENAL / URINARY TRACT ULTRASOUND COMPLETE COMPARISON:  None available. FINDINGS: Right Kidney: Renal measurements: 7.7 x 3.6 x 4.1 cm = volume: 60.9 mL . Echogenicity within normal limits. Mild diffuse cortical thinning. No mass or hydronephrosis visualized. Left Kidney: Renal measurements: 8.6 x 4.1 x 3.8 cm = volume: 70.6 mL. Echogenicity within normal limits. Mild diffuse cortical thinning. No mass or hydronephrosis visualized. Bladder: Appears normal for degree of bladder distention. Irregular cluster of cysts measuring up to 4.4 cm noted within the spleen. No internal vascularity or solid component. Partially visualized gallbladder appears somewhat distended. IMPRESSION: 1. Mild diffuse chronic cortical thinning of both kidneys. No hydronephrosis. 2. Incidental 4.4 cm irregular complex cyst versus cluster of cysts within the spleen. 3. Distended gallbladder. Electronically Signed   By: Jeannine Boga M.D.   On: 11/28/2018 22:35   Mr 3d Recon At Scanner  Result Date: 12/03/2018 CLINICAL DATA:  Common bile duct obstruction.  Upper abdominal pain. EXAM: MRI ABDOMEN WITHOUT AND WITH CONTRAST (INCLUDING MRCP) TECHNIQUE: Multiplanar multisequence MR imaging of the abdomen was performed both before and after the administration of intravenous contrast. Heavily T2-weighted images of the biliary and pancreatic ducts were obtained, and three-dimensional MRCP images were rendered by post processing. CONTRAST:  6 cc of Gadavist COMPARISON:  414 ultrasound.  12/01/2018 CT. FINDINGS: Mild to moderate motion degradation throughout. Lower chest: Small bilateral pleural effusions with bibasilar atelectasis. Mild cardiomegaly. Hepatobiliary: No suspicious liver lesion. Scattered hepatic cysts or bile duct hamartomas. Gallbladder distension, without stone or acute cholecystitis. Intrahepatic duct dilatation is again identified. The common duct is dilated including at 1.3 cm in the  porta hepatis on image 38/4. There are foci of dependent abnormal signal within the mid common duct, including at 7 mm on image 42/14. At the level of the ampulla, there is an equivocal filling defect which is most suspicious on coronal reformats, including on image 8/8. Pancreas: Atrophy and prominence of the pancreatic duct/side branches, likely within normal variation for age. No acute inflammation. Spleen: Multi septated cystic lesion within the medial spleen measures 4.3 x 3.8 cm. Adrenals/Urinary Tract: Normal adrenal glands. Bilateral renal lesions, primarily felt to represent cysts. Some demonstrate precontrast T1 hyperintensity on series 14, favoring minimally complex cysts. No hydronephrosis. Stomach/Bowel:  The stomach is underdistended but appears thick walled, including on series 14. Normal abdominal small bowel loops. Colonic diverticula. Vascular/Lymphatic: Advanced aortic atherosclerosis with ulcerative plaque throughout. No aneurysm. No abdominal adenopathy. Other:  Fluid in the pelvis, suboptimally and incompletely imaged. Musculoskeletal: No acute osseous abnormality. IMPRESSION: 1. Mild to moderate motion degradation. 2. Mid common duct dependent stones, maximally 7 mm. Equivocal distal common duct filling defect for which a larger stone cannot be excluded. Resultant intra and extrahepatic biliary duct dilatation with gallbladder distension. 3. Small bilateral pleural effusions with bibasilar atelectasis. 4. Gastric underdistention with apparent wall thickening. Cannot exclude gastritis. 5.  Aortic Atherosclerosis (ICD10-I70.0). 6. 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 Electronically Signed   By: Abigail Miyamoto M.D.   On: 12/03/2018 16:49   Dg Chest Port 1 View  Result Date: 12/06/2018 CLINICAL DATA:  85 year old female with cough EXAM: PORTABLE CHEST 1 VIEW COMPARISON:  11/28/2010 FINDINGS: Cardiomediastinal silhouette  unchanged in size and contour. No pneumothorax. Blunting of the bilateral costophrenic angles, new from the prior. Mild interlobular septal thickening with patchy opacity at the right lung base. Fullness in the central vasculature. No displaced fracture.  Osteopenia. IMPRESSION: Evidence of developing CHF with small bilateral pleural effusions and likely atelectasis. Infection cannot be excluded. Electronically Signed   By: Corrie Mckusick D.O.   On: 12/06/2018 12:58   Dg Chest Port 1 View  Result Date: 11/28/2018 CLINICAL DATA:  Rales. Vomiting and diarrhea. Weakness. EXAM: PORTABLE CHEST 1 VIEW COMPARISON:  08/30/2017 FINDINGS: Lung volumes are [name]. Mild left lung base atelectasis. Borderline cardiomegaly, unchanged from prior. Tortuous atherosclerotic thoracic aorta. Pulmonary vasculature is normal. No consolidation, pleural effusion, or pneumothorax. No acute osseous abnormalities are seen. Incidental note of gaseous gastric distention in the upper abdomen. IMPRESSION: [name] lung volumes with mild left lung base atelectasis. Electronically Signed   By: Keith Rake M.D.   On: 11/28/2018 23:03   Mr Abdomen Mrcp Moise Boring Contast  Result Date: 12/03/2018 CLINICAL DATA:  Common bile duct obstruction.  Upper abdominal pain. EXAM: MRI ABDOMEN WITHOUT AND WITH CONTRAST (INCLUDING MRCP) TECHNIQUE: Multiplanar multisequence MR imaging of the abdomen was performed both before and after the administration of intravenous contrast. Heavily T2-weighted images of the biliary and pancreatic ducts were obtained, and three-dimensional MRCP images were rendered by post processing. CONTRAST:  6 cc of Gadavist COMPARISON:  414 ultrasound.  12/01/2018 CT. FINDINGS: Mild to moderate motion degradation throughout. Lower chest: Small bilateral pleural effusions with bibasilar atelectasis. Mild cardiomegaly. Hepatobiliary: No suspicious liver lesion. Scattered hepatic cysts or bile duct hamartomas. Gallbladder distension, without  stone or acute cholecystitis. Intrahepatic duct dilatation is again identified. The common duct is dilated including at 1.3 cm in the porta hepatis on image 38/4. There are foci of dependent abnormal signal within the mid common duct, including at 7 mm on image 42/14. At the level of the ampulla, there is an equivocal filling defect which is most suspicious on coronal reformats, including on image 8/8. Pancreas: Atrophy and prominence of the pancreatic duct/side branches, likely within normal variation for age. No acute inflammation. Spleen: Multi septated cystic lesion within the medial spleen measures 4.3 x 3.8 cm. Adrenals/Urinary Tract: Normal adrenal glands. Bilateral renal lesions, primarily felt to represent cysts. Some demonstrate precontrast T1 hyperintensity on series 14, favoring minimally complex cysts. No hydronephrosis. Stomach/Bowel: The stomach is underdistended but appears thick walled, including on series 14. Normal abdominal small bowel  loops. Colonic diverticula. Vascular/Lymphatic: Advanced aortic atherosclerosis with ulcerative plaque throughout. No aneurysm. No abdominal adenopathy. Other:  Fluid in the pelvis, suboptimally and incompletely imaged. Musculoskeletal: No acute osseous abnormality. IMPRESSION: 1. Mild to moderate motion degradation. 2. Mid common duct dependent stones, maximally 7 mm. Equivocal distal common duct filling defect for which a larger stone cannot be excluded. Resultant intra and extrahepatic biliary duct dilatation with gallbladder distension. 3. Small bilateral pleural effusions with bibasilar atelectasis. 4. Gastric underdistention with apparent wall thickening. Cannot exclude gastritis. 5.  Aortic Atherosclerosis (ICD10-I70.0). 6. 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 Electronically Signed   By: Abigail Miyamoto M.D.   On: 12/03/2018 16:49   US Abdomen Limited Ruq  Result Date:  12/03/2018 CLINICAL DATA:  Dilated common bile duct and common bile duct. EXAM: ULTRASOUND ABDOMEN LIMITED RIGHT UPPER QUADRANT COMPARISON:  CT 12/01/2018 FINDINGS: Gallbladder: No gallstones or wall thickening visualized. There is a small amount of sludge in the gallbladder. The gallbladder is distended with a diameter of 5.2 cm. Common bile duct: Diameter: 13 mm. Liver: No focal lesion identified. Within normal limits in parenchymal echogenicity. Biliary dilatation within the hepatic parenchyma is noted. Portal vein is patent on color Doppler imaging with normal direction of blood flow towards the liver. Additional findings: A right pleural effusion is noted. IMPRESSION: Stable gallbladder dilatation. Common bile duct is dilated. Intrahepatic biliary dilatation is also noted. Findings suggest common bile duct obstruction. MRCP may be helpful. Incidental right pleural effusion. Electronically Signed   By: Marybelle Killings M.D.   On: 12/03/2018 07:46       Subjective: No complaints.  Chest pain from earlier today is resolved.  No fever, diarrhea, vomiting, confusion.    Discharge Exam: Vitals:   12/11/18 1000 12/11/18 1037  BP: (!) 172/61 (!) 165/63  Pulse: 63 62  Resp:    Temp:    SpO2: 98%    Vitals:   12/11/18 0906 12/11/18 0938 12/11/18 1000 12/11/18 1037  BP: (!) 178/64 (!) 176/62 (!) 172/61 (!) 165/63  Pulse:  62 63 62  Resp:      Temp:      TempSrc:      SpO2:   98%   Weight:      Height:        General: Pt is alert, awake, not in acute distress, lying in bed, intersactive Cardiovascular: RRR, nl S1-S2, no murmurs appreciated.   No LE edema.   Respiratory: Normal respiratory rate and rhythm.  CTAB without rales or wheezes. Abdominal: Abdomen soft and non-tender.  No distension or HSM.   Neuro/Psych: Strength symmetric in upper and lower extremities.  Judgment and insight appear normal.   The results of significant diagnostics from this hospitalization (including imaging,  microbiology, ancillary and laboratory) are listed below for reference.     Microbiology: Recent Results (from the past 240 hour(s))  C difficile quick scan w PCR reflex     Status: None   Collection Time: 12/04/18  7:00 AM  Result Value Ref Range Status   C Diff antigen NEGATIVE NEGATIVE Final   C Diff toxin NEGATIVE NEGATIVE Final   C Diff interpretation No C. difficile detected.  Final    Comment: Performed at [facility], [place] [address] [place], [place] [address]     Labs: BNP (last 3 results) No results for input(s): BNP in the last 8760 hours. Basic Metabolic Panel: Recent Labs  Lab 12/05/18 0345 12/06/18 0302 12/07/18 0238 12/09/18 0254 12/11/18 0435  NA 136 140 140 136 138  K 4.0 3.8 3.6 4.0 3.9  CL 108 111 107 107 107  CO2 18* 20* 22 21* 21*  GLUCOSE 98 94 110* 110* 92  BUN 15 15 14 17 19   CREATININE 1.03* 1.05* 1.36* 1.04* 1.00  CALCIUM 8.4* 8.2* 8.7* 8.8* 9.7  MG 2.4 2.0 1.8 1.9  --   PHOS 2.0* 3.7 4.3  --   --    Liver Function Tests: Recent Labs  Lab 12/05/18 0345  AST 20  ALT 11  ALKPHOS 56  BILITOT 0.6  PROT 5.2*  ALBUMIN 2.7*   No results for input(s): LIPASE, AMYLASE in the last 168 hours. No results for input(s): AMMONIA in the last 168 hours. CBC: Recent Labs  Lab 12/05/18 0345 12/06/18 0302 12/07/18 0238 12/09/18 0254 12/11/18 0435  WBC 5.4 4.9 4.4  --   --   NEUTROABS 3.1  --   --   --   --   HGB 9.3* 8.8* 8.7* 9.3* 10.8*  HCT 29.3* 27.6* 27.2* 29.7* 33.2*  MCV 90.2 91.7 92.5  --   --   PLT 197 206 221  --   --    Cardiac Enzymes: No results for input(s): CKTOTAL, CKMB, CKMBINDEX, TROPONINI in the last 168 hours. BNP: Invalid input(s): POCBNP CBG: No results for input(s): GLUCAP in the last 168 hours. D-Dimer No results for input(s): DDIMER in the last 72 hours. Hgb A1c No results for input(s): HGBA1C in the last 72 hours. Lipid Profile No results for input(s): CHOL, HDL, LDLCALC, TRIG, CHOLHDL, LDLDIRECT in the  last 72 hours. Thyroid function studies No results for input(s): TSH, T4TOTAL, T3FREE, THYROIDAB in the last 72 hours.  Invalid input(s): FREET3 Anemia work up No results for input(s): VITAMINB12, FOLATE, FERRITIN, TIBC, IRON, RETICCTPCT in the last 72 hours. Urinalysis    Component Value Date/Time   COLORURINE AMBER (A) 11/30/2018 1527   APPEARANCEUR CLOUDY (A) 11/30/2018 1527   LABSPEC 1.013 11/30/2018 1527   PHURINE 7.0 11/30/2018 1527   GLUCOSEU NEGATIVE 11/30/2018 1527   HGBUR MODERATE (A) 11/30/2018 1527   BILIRUBINUR NEGATIVE 11/30/2018 1527   KETONESUR NEGATIVE 11/30/2018 1527   PROTEINUR 30 (A) 11/30/2018 1527   UROBILINOGEN 0.2 08/22/2013 0825   NITRITE NEGATIVE 11/30/2018 1527   LEUKOCYTESUR LARGE (A) 11/30/2018 1527   Sepsis Labs Invalid input(s): PROCALCITONIN,  WBC,  LACTICIDVEN Microbiology Recent Results (from the past 240 hour(s))  C difficile quick scan w PCR reflex     Status: None   Collection Time: 12/04/18  7:00 AM  Result Value Ref Range Status   C Diff antigen NEGATIVE NEGATIVE Final   C Diff toxin NEGATIVE NEGATIVE Final   C Diff interpretation No C. difficile detected.  Final    Comment: Performed at [facility], [place] [address] [place], [place] [address]     Time coordinating discharge: 40 minutes      SIGNED:   Edwin Dada, MD  [facility] 12/11/2018, 10:57 AM

## 2018-12-11 NOTE — Progress Notes (Signed)
[name] to be D/C'd to [facility] SNF per MD order.  Discussed with the patient and all questions fully answered.  VSS, Skin clean, dry and intact without evidence of skin break down, no evidence of skin tears noted. IV catheter discontinued intact. Site without signs and symptoms of complications. Dressing and pressure applied.  An After Visit Summary was printed and given to the patient. Patient received prescription.  D/c education completed with patient/family including follow up instructions, medication list, d/c activities limitations if indicated, with other d/c instructions as indicated by MD - patient able to verbalize understanding, all questions fully answered.   Patient instructed to return to ED, call 911, or call MD for any changes in condition.   Patient escorted via PTAR and D/C to SNF via private auto.  Nurse Katie notify and report given to her .    [name] 12/11/2018 12:20 PM

## 2018-12-12 DIAGNOSIS — R112 Nausea with vomiting, unspecified: Secondary | ICD-10-CM | POA: Diagnosis not present

## 2018-12-12 DIAGNOSIS — K529 Noninfective gastroenteritis and colitis, unspecified: Secondary | ICD-10-CM | POA: Diagnosis not present

## 2018-12-12 DIAGNOSIS — R197 Diarrhea, unspecified: Secondary | ICD-10-CM | POA: Diagnosis not present

## 2018-12-12 DIAGNOSIS — N179 Acute kidney failure, unspecified: Secondary | ICD-10-CM | POA: Diagnosis not present

## 2018-12-13 DIAGNOSIS — R112 Nausea with vomiting, unspecified: Secondary | ICD-10-CM | POA: Diagnosis not present

## 2018-12-13 DIAGNOSIS — K8050 Calculus of bile duct without cholangitis or cholecystitis without obstruction: Secondary | ICD-10-CM | POA: Diagnosis not present

## 2018-12-13 DIAGNOSIS — I10 Essential (primary) hypertension: Secondary | ICD-10-CM | POA: Diagnosis not present

## 2018-12-13 DIAGNOSIS — R197 Diarrhea, unspecified: Secondary | ICD-10-CM | POA: Diagnosis not present

## 2018-12-16 ENCOUNTER — Non-Acute Institutional Stay: Payer: Medicare Other | Admitting: Licensed Clinical Social Worker

## 2018-12-16 ENCOUNTER — Other Ambulatory Visit: Payer: Self-pay

## 2018-12-16 DIAGNOSIS — Z515 Encounter for palliative care: Secondary | ICD-10-CM

## 2018-12-16 NOTE — Progress Notes (Signed)
COMMUNITY PALLIATIVE CARE SW NOTE  PATIENT NAME: [name] DOB: 1920/10/01 MRN: [id number]  PRIMARY CARE PROVIDER: Wenda Low, MD  RESPONSIBLE PARTY:  Acct ID - Guarantor Home Phone Work Phone Relationship Acct Type  [id number] [name]  Self P/F     [place], [place], [place] [address]    Due to the COVID-19 crisis, this virtual check-in visit was done via telephone from my office and it was initiated and consent given by this patient and or family.   PLAN OF CARE and INTERVENTIONS:             1. GOALS OF CARE/ ADVANCE CARE PLANNING:  Goal is for patient to return home from [facility].  Patient is a DNR. 2. SOCIAL/EMOTIONAL/SPIRITUAL ASSESSMENT/ INTERVENTIONS:  SW contacted patient's daughter, [name], to assess coping and needs.  She said patient was moved from the hospital to [facility] on 4/22 for rehab.  [name] reported the hospital stated patient required 24/7 assistance and daughter could not provide.  Patient is quarantined since being admitted and is having difficulty understanding this.  SW provided active listening and supportive counseling. 3. PATIENT/CAREGIVER EDUCATION/ COPING:  SW provided education regarding the virtual check-in visit.  Daughter expressed her feelings openly. 4. PERSONAL EMERGENCY PLAN:  Per facility protocol. 5. COMMUNITY RESOURCES COORDINATION/ HEALTH CARE NAVIGATION:  None. 6. FINANCIAL/LEGAL CONCERNS/INTERVENTIONS:  None.     SOCIAL HX:  Social History   Tobacco Use  . Smoking status: Never Smoker  . Smokeless tobacco: Never Used  Substance Use Topics  . Alcohol use: No    CODE STATUS:  DNR  ADVANCED DIRECTIVES: Y MOST FORM COMPLETE: N HOSPICE EDUCATION PROVIDED: N Duration of visit and documentation:  30 minutes.      Creola Corn Keison Glendinning, LCSW

## 2018-12-17 DIAGNOSIS — I471 Supraventricular tachycardia: Secondary | ICD-10-CM | POA: Diagnosis not present

## 2018-12-17 DIAGNOSIS — N179 Acute kidney failure, unspecified: Secondary | ICD-10-CM | POA: Diagnosis not present

## 2018-12-17 DIAGNOSIS — D649 Anemia, unspecified: Secondary | ICD-10-CM | POA: Diagnosis not present

## 2018-12-17 DIAGNOSIS — I119 Hypertensive heart disease without heart failure: Secondary | ICD-10-CM | POA: Diagnosis not present

## 2018-12-20 DIAGNOSIS — K8050 Calculus of bile duct without cholangitis or cholecystitis without obstruction: Secondary | ICD-10-CM | POA: Diagnosis not present

## 2018-12-20 DIAGNOSIS — H04123 Dry eye syndrome of bilateral lacrimal glands: Secondary | ICD-10-CM | POA: Diagnosis not present

## 2018-12-20 DIAGNOSIS — I471 Supraventricular tachycardia: Secondary | ICD-10-CM | POA: Diagnosis not present

## 2018-12-24 ENCOUNTER — Non-Acute Institutional Stay: Payer: Medicare Other

## 2018-12-24 ENCOUNTER — Other Ambulatory Visit: Payer: Medicare Other

## 2018-12-24 ENCOUNTER — Other Ambulatory Visit: Payer: Self-pay

## 2018-12-24 DIAGNOSIS — Z515 Encounter for palliative care: Secondary | ICD-10-CM

## 2018-12-24 NOTE — Progress Notes (Signed)
PATIENT NAME: [name] DOB: 10/24/1920 MRN: [id number]  PRIMARY CARE PROVIDER: Wenda Low, MD  RESPONSIBLE PARTY:  Acct ID - Guarantor Home Phone Work Phone Relationship Acct Type  [id number] [name]  Self P/F     [place], [name], [place] [address]    PLAN OF CARE and INTERVENTIONS:               1.  GOALS OF CARE/ ADVANCE CARE PLANNING:  Return to her home after rehab with caregiver in home during the day. (Patient scheduled to discharge home 12/25/18.)                2.  PATIENT/CAREGIVER EDUCATION:  Education on fall precautions, education on s/s of infection, fever, chills, cough, shortness of breath, fatigue.               3.  DISEASE STATUS: Patient is a 85 year old patient who was admitted to [facility]  On 12/11/18 for rehab.  Azucena Cecil in agreement with TELEHEALTH visit with patient today.  Due to the COVID-19 crisis, this visit was done via telemedicine from my office and it was initiated and consent by this patient and or family. Patient sitting in chair in her room, smiling and pleasant.  Patient denies having any pain.  Patient informs SW and RN that she will be returning home tomorrow.  Secundino Ginger states patient remains weak but is able to ambulate with her walker with stand by assist.  Patient does need assist with bathing.  Patient is continent of bowel and bladder per Secundino Ginger. Patient continues to suffer with chronic low back pain and constipation.  RN reviewed medications with ADON and patient is taking a stool softener.  Stage I decub patient had on right heel has healed.  Patient to receive PT, OT and respite when she returns home per [facility].  Patient informed her Palliative care home team would be informed that she would be returning to her home tomorrow.  Patient denies having any concerns.  Palliative care RN Sandria Senter updated that patient would be discharging home tomorrow.              HISTORY OF PRESENT ILLNESS:    CODE STATUS: DNR  ADVANCED  DIRECTIVES: Y MOST FORM: No PPS: 40%   PHYSICAL EXAM:   VITALS: Vital signs "normal" per LeeAnn ADON at facility  LUNGS: No cough or shortness of breath CARDIAC:  EXTREMITIES: No edema SKIN: no open areas of skin breakdown  NEURO: Alert and oriented to self and familiars       [name], RN

## 2018-12-24 NOTE — Progress Notes (Signed)
COMMUNITY PALLIATIVE CARE SW NOTE  PATIENT NAME: [name] DOB: [birth year] MRN: [id number]  PRIMARY CARE PROVIDER: Wenda Low, MD  RESPONSIBLE PARTY:  Acct ID - Guarantor Home Phone Work Phone Relationship Acct Type  [id number] [name]  Self P/F     [place], [name], [name] [address]     PLAN OF CARE and INTERVENTIONS:             1. GOALS OF CARE/ ADVANCE CARE PLANNING:  Goal is for patient to return home from [facility] on Wednesday. Patient is a DNR. 2. SOCIAL/EMOTIONAL/SPIRITUAL ASSESSMENT/ INTERVENTIONS:  SW and RN met with patient and Secundino Ginger, facility staff member via TELEHEALTH. Patient does report chronic low back pain and constipation. RN addressed medications. Patient was sitting up in a chair, in her room. Patient reports that she is doing "fair", denies any issues. Patient showed team her fingernails and said she recently had them done.  3. PATIENT/CAREGIVER EDUCATION/ COPING:  Patient was smiling, shared her excitement for the plan for her to return home on Wednesday. 4. PERSONAL EMERGENCY PLAN:  Facility to call 9-1-1 for emergencies. Due to COVID-19, visitors are limited at facility. 5. COMMUNITY RESOURCES COORDINATION/ HEALTH CARE NAVIGATION:  None at this time. Palliative team will collaborate with home health agency and assist with transition back home.  6. FINANCIAL/LEGAL CONCERNS/INTERVENTIONS:  None.     SOCIAL HX:  Social History   Tobacco Use  . Smoking status: Never Smoker  . Smokeless tobacco: Never Used  Substance Use Topics  . Alcohol use: No    CODE STATUS:   Code Status: Prior  ADVANCED DIRECTIVES: N MOST FORM COMPLETE:  No HOSPICE EDUCATION PROVIDED: None.  Due to the COVID-19 crisis, this visit was done via Zoom from my office and it was initiated and consent by this patient and/or facility. This was a scheduled visit.   I spent 30 minutes with patient/family, from 2:00-2:30p providing education, support and  consultation.     [name], LCSW

## 2018-12-25 DIAGNOSIS — R197 Diarrhea, unspecified: Secondary | ICD-10-CM | POA: Diagnosis not present

## 2018-12-25 DIAGNOSIS — I119 Hypertensive heart disease without heart failure: Secondary | ICD-10-CM | POA: Diagnosis not present

## 2018-12-25 DIAGNOSIS — K8050 Calculus of bile duct without cholangitis or cholecystitis without obstruction: Secondary | ICD-10-CM | POA: Diagnosis not present

## 2018-12-25 DIAGNOSIS — R112 Nausea with vomiting, unspecified: Secondary | ICD-10-CM | POA: Diagnosis not present

## 2018-12-26 ENCOUNTER — Other Ambulatory Visit: Payer: Medicare Other | Admitting: Licensed Clinical Social Worker

## 2018-12-26 ENCOUNTER — Other Ambulatory Visit: Payer: Self-pay

## 2018-12-26 DIAGNOSIS — I10 Essential (primary) hypertension: Secondary | ICD-10-CM | POA: Diagnosis not present

## 2018-12-26 DIAGNOSIS — R2681 Unsteadiness on feet: Secondary | ICD-10-CM | POA: Diagnosis not present

## 2018-12-26 DIAGNOSIS — M810 Age-related osteoporosis without current pathological fracture: Secondary | ICD-10-CM | POA: Diagnosis not present

## 2018-12-26 DIAGNOSIS — Z602 Problems related to living alone: Secondary | ICD-10-CM | POA: Diagnosis not present

## 2018-12-26 DIAGNOSIS — M6281 Muscle weakness (generalized): Secondary | ICD-10-CM | POA: Diagnosis not present

## 2018-12-26 DIAGNOSIS — Z7902 Long term (current) use of antithrombotics/antiplatelets: Secondary | ICD-10-CM | POA: Diagnosis not present

## 2018-12-26 DIAGNOSIS — D649 Anemia, unspecified: Secondary | ICD-10-CM | POA: Diagnosis not present

## 2018-12-26 DIAGNOSIS — Z515 Encounter for palliative care: Secondary | ICD-10-CM

## 2018-12-26 DIAGNOSIS — F339 Major depressive disorder, recurrent, unspecified: Secondary | ICD-10-CM | POA: Diagnosis not present

## 2018-12-26 NOTE — Progress Notes (Signed)
COMMUNITY PALLIATIVE CARE SW NOTE  PATIENT NAME: [name] DOB: May 05, 1921 MRN: [id number]  PRIMARY CARE PROVIDER: Wenda Low, MD  RESPONSIBLE PARTY:  Acct ID - Guarantor Home Phone Work Phone Relationship Acct Type  [id number] [name]  Self P/F     [place], [place], [place] [address]   Due to the COVID-19 crisis, this virtual check-in visit was done via telephone from my office and it was initiated and consent given by thispatient and or family.  PLAN OF CARE and INTERVENTIONS:             1. GOALS OF CARE/ ADVANCE CARE PLANNING:  Patient's goal is to remain in her home as long as possible.  Patient has a DNR. 2. SOCIAL/EMOTIONAL/SPIRITUAL ASSESSMENT/ INTERVENTIONS:  SW conducted a Sales executive visit with patient's daughter, [name], in patient's home.  She said [facility] will begin providing PT/OT.  Patient's private caregiver, [name] will begin on Monday.  [name] stated patient remains week.  SW provided active listening and supportive counseling. 3. PATIENT/CAREGIVER EDUCATION/ COPING:  Patient's daughter expresses her feelings openly. 4. PERSONAL EMERGENCY PLAN:  Patient rests when she is fatigued.  Family members will contact patient MD as needed. 5. COMMUNITY RESOURCES COORDINATION/ HEALTH CARE NAVIGATION:  Patient has private caregivers. 6. FINANCIAL/LEGAL CONCERNS/INTERVENTIONS:  None.     SOCIAL HX:  Social History   Tobacco Use  . Smoking status: Never Smoker  . Smokeless tobacco: Never Used  Substance Use Topics  . Alcohol use: No    CODE STATUS:  DNR ADVANCED DIRECTIVES: Y MOST FORM COMPLETE:  N HOSPICE EDUCATION PROVIDED: N Duration of visit and documentation:  30 minutes.      Creola Corn Zabdiel Dripps, LCSW

## 2018-12-27 DIAGNOSIS — F339 Major depressive disorder, recurrent, unspecified: Secondary | ICD-10-CM | POA: Diagnosis not present

## 2018-12-27 DIAGNOSIS — D649 Anemia, unspecified: Secondary | ICD-10-CM | POA: Diagnosis not present

## 2018-12-27 DIAGNOSIS — M810 Age-related osteoporosis without current pathological fracture: Secondary | ICD-10-CM | POA: Diagnosis not present

## 2018-12-27 DIAGNOSIS — R2681 Unsteadiness on feet: Secondary | ICD-10-CM | POA: Diagnosis not present

## 2018-12-27 DIAGNOSIS — M6281 Muscle weakness (generalized): Secondary | ICD-10-CM | POA: Diagnosis not present

## 2018-12-30 ENCOUNTER — Other Ambulatory Visit: Payer: Self-pay | Admitting: *Deleted

## 2018-12-30 DIAGNOSIS — R2681 Unsteadiness on feet: Secondary | ICD-10-CM | POA: Diagnosis not present

## 2018-12-30 DIAGNOSIS — M810 Age-related osteoporosis without current pathological fracture: Secondary | ICD-10-CM | POA: Diagnosis not present

## 2018-12-30 DIAGNOSIS — D649 Anemia, unspecified: Secondary | ICD-10-CM | POA: Diagnosis not present

## 2018-12-30 DIAGNOSIS — M6281 Muscle weakness (generalized): Secondary | ICD-10-CM | POA: Diagnosis not present

## 2018-12-30 DIAGNOSIS — F339 Major depressive disorder, recurrent, unspecified: Secondary | ICD-10-CM | POA: Diagnosis not present

## 2018-12-30 NOTE — Patient Outreach (Signed)
[facility]) Care Management  12/30/2018  SUMAYYA MUHA 1921-08-05 [id number]   Member assessed for potential [place] [facility] needs as a benefit for her [employer] Medicare insurance.  Per PatientPing and acuity member recently discharged from [facility] on 12/25/18 to home with home health services.  Chart reviewed. Noted member is active with outpatient home based palliative care program [facility] thru [facility]. Per chart notes, member has caregivers as well.   No identifiable [facility] Care Management needs at this time.   Will sign off.   Marthenia Rolling, MSN-Ed, RN,BSN [place] Acute Care Coordinator [phone]

## 2018-12-31 DIAGNOSIS — R2681 Unsteadiness on feet: Secondary | ICD-10-CM | POA: Diagnosis not present

## 2018-12-31 DIAGNOSIS — M6281 Muscle weakness (generalized): Secondary | ICD-10-CM | POA: Diagnosis not present

## 2018-12-31 DIAGNOSIS — M810 Age-related osteoporosis without current pathological fracture: Secondary | ICD-10-CM | POA: Diagnosis not present

## 2018-12-31 DIAGNOSIS — F339 Major depressive disorder, recurrent, unspecified: Secondary | ICD-10-CM | POA: Diagnosis not present

## 2018-12-31 DIAGNOSIS — D649 Anemia, unspecified: Secondary | ICD-10-CM | POA: Diagnosis not present

## 2019-01-01 DIAGNOSIS — D649 Anemia, unspecified: Secondary | ICD-10-CM | POA: Diagnosis not present

## 2019-01-01 DIAGNOSIS — M6281 Muscle weakness (generalized): Secondary | ICD-10-CM | POA: Diagnosis not present

## 2019-01-01 DIAGNOSIS — F339 Major depressive disorder, recurrent, unspecified: Secondary | ICD-10-CM | POA: Diagnosis not present

## 2019-01-01 DIAGNOSIS — R2681 Unsteadiness on feet: Secondary | ICD-10-CM | POA: Diagnosis not present

## 2019-01-01 DIAGNOSIS — M810 Age-related osteoporosis without current pathological fracture: Secondary | ICD-10-CM | POA: Diagnosis not present

## 2019-01-02 DIAGNOSIS — F339 Major depressive disorder, recurrent, unspecified: Secondary | ICD-10-CM | POA: Diagnosis not present

## 2019-01-02 DIAGNOSIS — R2681 Unsteadiness on feet: Secondary | ICD-10-CM | POA: Diagnosis not present

## 2019-01-02 DIAGNOSIS — M6281 Muscle weakness (generalized): Secondary | ICD-10-CM | POA: Diagnosis not present

## 2019-01-02 DIAGNOSIS — D649 Anemia, unspecified: Secondary | ICD-10-CM | POA: Diagnosis not present

## 2019-01-02 DIAGNOSIS — M810 Age-related osteoporosis without current pathological fracture: Secondary | ICD-10-CM | POA: Diagnosis not present

## 2019-01-06 DIAGNOSIS — D649 Anemia, unspecified: Secondary | ICD-10-CM | POA: Diagnosis not present

## 2019-01-06 DIAGNOSIS — M810 Age-related osteoporosis without current pathological fracture: Secondary | ICD-10-CM | POA: Diagnosis not present

## 2019-01-06 DIAGNOSIS — R2681 Unsteadiness on feet: Secondary | ICD-10-CM | POA: Diagnosis not present

## 2019-01-06 DIAGNOSIS — M6281 Muscle weakness (generalized): Secondary | ICD-10-CM | POA: Diagnosis not present

## 2019-01-06 DIAGNOSIS — F339 Major depressive disorder, recurrent, unspecified: Secondary | ICD-10-CM | POA: Diagnosis not present

## 2019-01-07 DIAGNOSIS — F339 Major depressive disorder, recurrent, unspecified: Secondary | ICD-10-CM | POA: Diagnosis not present

## 2019-01-07 DIAGNOSIS — M810 Age-related osteoporosis without current pathological fracture: Secondary | ICD-10-CM | POA: Diagnosis not present

## 2019-01-07 DIAGNOSIS — R2681 Unsteadiness on feet: Secondary | ICD-10-CM | POA: Diagnosis not present

## 2019-01-07 DIAGNOSIS — M6281 Muscle weakness (generalized): Secondary | ICD-10-CM | POA: Diagnosis not present

## 2019-01-07 DIAGNOSIS — D649 Anemia, unspecified: Secondary | ICD-10-CM | POA: Diagnosis not present

## 2019-01-08 DIAGNOSIS — M810 Age-related osteoporosis without current pathological fracture: Secondary | ICD-10-CM | POA: Diagnosis not present

## 2019-01-08 DIAGNOSIS — R2681 Unsteadiness on feet: Secondary | ICD-10-CM | POA: Diagnosis not present

## 2019-01-08 DIAGNOSIS — F339 Major depressive disorder, recurrent, unspecified: Secondary | ICD-10-CM | POA: Diagnosis not present

## 2019-01-08 DIAGNOSIS — M6281 Muscle weakness (generalized): Secondary | ICD-10-CM | POA: Diagnosis not present

## 2019-01-08 DIAGNOSIS — D649 Anemia, unspecified: Secondary | ICD-10-CM | POA: Diagnosis not present

## 2019-01-09 DIAGNOSIS — F339 Major depressive disorder, recurrent, unspecified: Secondary | ICD-10-CM | POA: Diagnosis not present

## 2019-01-09 DIAGNOSIS — M810 Age-related osteoporosis without current pathological fracture: Secondary | ICD-10-CM | POA: Diagnosis not present

## 2019-01-09 DIAGNOSIS — D649 Anemia, unspecified: Secondary | ICD-10-CM | POA: Diagnosis not present

## 2019-01-09 DIAGNOSIS — M6281 Muscle weakness (generalized): Secondary | ICD-10-CM | POA: Diagnosis not present

## 2019-01-09 DIAGNOSIS — R2681 Unsteadiness on feet: Secondary | ICD-10-CM | POA: Diagnosis not present

## 2019-01-10 DIAGNOSIS — R269 Unspecified abnormalities of gait and mobility: Secondary | ICD-10-CM | POA: Diagnosis not present

## 2019-01-10 DIAGNOSIS — I739 Peripheral vascular disease, unspecified: Secondary | ICD-10-CM | POA: Diagnosis not present

## 2019-01-10 DIAGNOSIS — D649 Anemia, unspecified: Secondary | ICD-10-CM | POA: Diagnosis not present

## 2019-01-10 DIAGNOSIS — R197 Diarrhea, unspecified: Secondary | ICD-10-CM | POA: Diagnosis not present

## 2019-01-10 DIAGNOSIS — N183 Chronic kidney disease, stage 3 (moderate): Secondary | ICD-10-CM | POA: Diagnosis not present

## 2019-01-10 DIAGNOSIS — N179 Acute kidney failure, unspecified: Secondary | ICD-10-CM | POA: Diagnosis not present

## 2019-01-14 DIAGNOSIS — F339 Major depressive disorder, recurrent, unspecified: Secondary | ICD-10-CM | POA: Diagnosis not present

## 2019-01-14 DIAGNOSIS — R2681 Unsteadiness on feet: Secondary | ICD-10-CM | POA: Diagnosis not present

## 2019-01-14 DIAGNOSIS — D649 Anemia, unspecified: Secondary | ICD-10-CM | POA: Diagnosis not present

## 2019-01-14 DIAGNOSIS — M810 Age-related osteoporosis without current pathological fracture: Secondary | ICD-10-CM | POA: Diagnosis not present

## 2019-01-14 DIAGNOSIS — M6281 Muscle weakness (generalized): Secondary | ICD-10-CM | POA: Diagnosis not present

## 2019-01-15 DIAGNOSIS — R2681 Unsteadiness on feet: Secondary | ICD-10-CM | POA: Diagnosis not present

## 2019-01-15 DIAGNOSIS — N179 Acute kidney failure, unspecified: Secondary | ICD-10-CM | POA: Diagnosis not present

## 2019-01-15 DIAGNOSIS — M6281 Muscle weakness (generalized): Secondary | ICD-10-CM | POA: Diagnosis not present

## 2019-01-15 DIAGNOSIS — D649 Anemia, unspecified: Secondary | ICD-10-CM | POA: Diagnosis not present

## 2019-01-15 DIAGNOSIS — M810 Age-related osteoporosis without current pathological fracture: Secondary | ICD-10-CM | POA: Diagnosis not present

## 2019-01-15 DIAGNOSIS — F339 Major depressive disorder, recurrent, unspecified: Secondary | ICD-10-CM | POA: Diagnosis not present

## 2019-01-16 DIAGNOSIS — F339 Major depressive disorder, recurrent, unspecified: Secondary | ICD-10-CM | POA: Diagnosis not present

## 2019-01-16 DIAGNOSIS — R2681 Unsteadiness on feet: Secondary | ICD-10-CM | POA: Diagnosis not present

## 2019-01-16 DIAGNOSIS — M6281 Muscle weakness (generalized): Secondary | ICD-10-CM | POA: Diagnosis not present

## 2019-01-16 DIAGNOSIS — D649 Anemia, unspecified: Secondary | ICD-10-CM | POA: Diagnosis not present

## 2019-01-16 DIAGNOSIS — M810 Age-related osteoporosis without current pathological fracture: Secondary | ICD-10-CM | POA: Diagnosis not present

## 2019-01-20 DIAGNOSIS — M810 Age-related osteoporosis without current pathological fracture: Secondary | ICD-10-CM | POA: Diagnosis not present

## 2019-01-20 DIAGNOSIS — F339 Major depressive disorder, recurrent, unspecified: Secondary | ICD-10-CM | POA: Diagnosis not present

## 2019-01-20 DIAGNOSIS — M6281 Muscle weakness (generalized): Secondary | ICD-10-CM | POA: Diagnosis not present

## 2019-01-20 DIAGNOSIS — D649 Anemia, unspecified: Secondary | ICD-10-CM | POA: Diagnosis not present

## 2019-01-20 DIAGNOSIS — R2681 Unsteadiness on feet: Secondary | ICD-10-CM | POA: Diagnosis not present

## 2019-01-24 DIAGNOSIS — R2681 Unsteadiness on feet: Secondary | ICD-10-CM | POA: Diagnosis not present

## 2019-01-24 DIAGNOSIS — D649 Anemia, unspecified: Secondary | ICD-10-CM | POA: Diagnosis not present

## 2019-01-24 DIAGNOSIS — F339 Major depressive disorder, recurrent, unspecified: Secondary | ICD-10-CM | POA: Diagnosis not present

## 2019-01-24 DIAGNOSIS — M810 Age-related osteoporosis without current pathological fracture: Secondary | ICD-10-CM | POA: Diagnosis not present

## 2019-01-24 DIAGNOSIS — M6281 Muscle weakness (generalized): Secondary | ICD-10-CM | POA: Diagnosis not present

## 2019-02-03 DIAGNOSIS — M1990 Unspecified osteoarthritis, unspecified site: Secondary | ICD-10-CM | POA: Diagnosis not present

## 2019-02-03 DIAGNOSIS — G450 Vertebro-basilar artery syndrome: Secondary | ICD-10-CM | POA: Diagnosis not present

## 2019-02-03 DIAGNOSIS — E782 Mixed hyperlipidemia: Secondary | ICD-10-CM | POA: Diagnosis not present

## 2019-02-03 DIAGNOSIS — D649 Anemia, unspecified: Secondary | ICD-10-CM | POA: Diagnosis not present

## 2019-02-03 DIAGNOSIS — H409 Unspecified glaucoma: Secondary | ICD-10-CM | POA: Diagnosis not present

## 2019-02-03 DIAGNOSIS — E039 Hypothyroidism, unspecified: Secondary | ICD-10-CM | POA: Diagnosis not present

## 2019-02-03 DIAGNOSIS — N183 Chronic kidney disease, stage 3 (moderate): Secondary | ICD-10-CM | POA: Diagnosis not present

## 2019-02-03 DIAGNOSIS — F324 Major depressive disorder, single episode, in partial remission: Secondary | ICD-10-CM | POA: Diagnosis not present

## 2019-02-03 DIAGNOSIS — G459 Transient cerebral ischemic attack, unspecified: Secondary | ICD-10-CM | POA: Diagnosis not present

## 2019-02-03 DIAGNOSIS — M810 Age-related osteoporosis without current pathological fracture: Secondary | ICD-10-CM | POA: Diagnosis not present

## 2019-02-03 DIAGNOSIS — I10 Essential (primary) hypertension: Secondary | ICD-10-CM | POA: Diagnosis not present

## 2019-02-12 ENCOUNTER — Other Ambulatory Visit: Payer: Medicare Other | Admitting: *Deleted

## 2019-02-12 ENCOUNTER — Other Ambulatory Visit: Payer: Self-pay

## 2019-02-12 DIAGNOSIS — N390 Urinary tract infection, site not specified: Secondary | ICD-10-CM | POA: Diagnosis not present

## 2019-02-12 DIAGNOSIS — Z515 Encounter for palliative care: Secondary | ICD-10-CM

## 2019-02-12 DIAGNOSIS — R410 Disorientation, unspecified: Secondary | ICD-10-CM | POA: Diagnosis not present

## 2019-02-18 NOTE — Progress Notes (Signed)
COMMUNITY PALLIATIVE CARE RN NOTE  PATIENT NAME: [name] DOB: Apr 22, 1921 MRN: [id number]  PRIMARY CARE PROVIDER: Wenda Low, MD  RESPONSIBLE PARTY:  Acct ID - Guarantor Home Phone Work Phone Relationship Acct Type  [id number] [name]346 [phone]  Self P/F     [place], [name], [place] [address]   Covid Pre-screening Negative  PLAN OF CARE and INTERVENTION:  1. ADVANCE CARE PLANNING/GOALS OF CARE: Goal is for patient to remain in her home. She is a DNR. 2. PATIENT/CAREGIVER EDUCATION: Safe Mobility/Transfers 3. DISEASE STATUS: Met with patient and hired caregiver in patient's home. Patient is sitting up on her couch awake and alert. She denies pain at this time, but does have lower back at times. Tylenol remains effective. Hired caregiver reports that patient has not been feeling well over the past few days. She has been more lethargic and napping more during the day. She is also noticing increased overall weakness and leaning to the left. She has also been experiencing more anxiety. Family is concerned that she may have another UTI. Patient denies dysuria, strong odor or fever, however caregiver states that she has never c/o these symptoms when she tested positive for a UTI in the past. She has had a decrease in her appetite also over the past few days. For the past 2 nights, she has been unable to eat much for dinner d/t feelings of nausea. Today, she ate breakfast ok, and had a cup of soup, crackers and some ice cream for lunch without any nausea. She is sleeping well during the night. She remains able to transfer herself to a wheelchair and propel herself around the home. She is able to ambulate using a walker, but does not do this while alone for safety purposes. She is able to shower and dress herself with stand-by assistance. Requires help at times, as she does tire easily and it takes longer for her to perform this task by herself. She is also able to feed herself, but has  noticed that her hands are more shaky. She is continent of both bowel and bladder. Last BM yesterday. Stool was firm. Recommended that she restart stool softeners to help. She has a virtual appointment with her PCP today. I contacted PCP and left a voicemail regarding my visit today to provide added details to how patient is doing overall. Will continue to monitor.  HISTORY OF PRESENT ILLNESS:  This is a 85 yo female who resides at home. She has a hired caregiver during the week, and her daughter lives next door. Palliative Care Team continues to follow patient. Next visit scheduled in 1 month.   CODE STATUS: DNR  ADVANCED DIRECTIVES: Y (HCPOA) MOST FORM: no PPS: 40%   PHYSICAL EXAM:   VITALS: Today's Vitals   02/12/19 1504  BP: (!) 159/75  Pulse: (!) 52  Resp: 16  Temp: (!) 97.1 F (36.2 C)  TempSrc: Temporal  SpO2: 96%  PainSc: 0-No pain    LUNGS: clear to auscultation  CARDIAC: Cor Loletha Grayer EXTREMITIES: No edema SKIN: Exposed skin is dry and intact  NEURO: Alert and oriented x 2, forgetful, generalized weakness, ambulatory with walker (stand-by assist)    (Duration of visit and documentation 75 minutes)   Daryl Eastern, RN BSN

## 2019-03-07 DIAGNOSIS — Z79899 Other long term (current) drug therapy: Secondary | ICD-10-CM | POA: Diagnosis not present

## 2019-03-07 DIAGNOSIS — R410 Disorientation, unspecified: Secondary | ICD-10-CM | POA: Diagnosis not present

## 2019-03-07 DIAGNOSIS — I10 Essential (primary) hypertension: Secondary | ICD-10-CM | POA: Diagnosis not present

## 2019-03-07 DIAGNOSIS — R600 Localized edema: Secondary | ICD-10-CM | POA: Diagnosis not present

## 2019-03-07 DIAGNOSIS — R609 Edema, unspecified: Secondary | ICD-10-CM | POA: Diagnosis not present

## 2019-03-12 ENCOUNTER — Other Ambulatory Visit: Payer: Medicare Other | Admitting: *Deleted

## 2019-03-12 ENCOUNTER — Other Ambulatory Visit: Payer: Self-pay

## 2019-03-12 DIAGNOSIS — Z515 Encounter for palliative care: Secondary | ICD-10-CM | POA: Diagnosis not present

## 2019-03-14 NOTE — Progress Notes (Signed)
COMMUNITY PALLIATIVE CARE RN NOTE  PATIENT NAME: [name] DOB: 08-31-1920 MRN: [id number]  PRIMARY CARE PROVIDER: Wenda Low, MD  RESPONSIBLE PARTY:  Acct ID - Guarantor Home Phone Work Phone Relationship Acct Type  [id number] [name]  Self P/F     [place], [place], [place] [address]   Covid-19 Pre-screening Negative  PLAN OF CARE and INTERVENTION:  1. ADVANCE CARE PLANNING/GOALS OF CARE: Goal is to remain in her home with hired caregivers. She is a DNR. 2. PATIENT/CAREGIVER EDUCATION: Safe Mobility/Transfers 3. DISEASE STATUS: Met with patient and hired caregiver in patient's home. Upon arrival, patient sitting up on her couch awake and alert. Pleasant and engaging. Forgetful at times. She reports feeling ok today. Denies pain at this time. Occasional lower back pain. Continues with Tylenol each night at bedtime and PRN during the day. A month ago, patient was diagnosed with another UTI. She was placed on Cipro x 7 days. About 2 weeks after completing antibiotics, she began to experience increased weakness again and some disorientation. She was taken back to her PCP office where they drew labs and obtained another urine specimen. Urine was negative for UTI, but she was found to be dehydrated. Caregiver and family are pushing fluids. She drank about 32 oz of water yesterday and is also eating fruits high in water content. Caregiver also bought some popsicles for her to try. Her intake is good. She denies any recent issues with nausea/vomiting. She remains able to transfer independently to her wheelchair and propel herself around her home. She ambulates using her walker, only when caregiver is present for safety. She has a routine eye exam scheduled for 8/3 and routine dental exam on 8/4. Will continue to monitor.  HISTORY OF PRESENT ILLNESS:  This is a 85 yo female who resides in her home. She does have caregivers that assists her with bathing, dressing, meal  preparation and light housework. Palliative Care team continues to follow patient. Next visit scheduled in one month.  CODE STATUS: DNR  ADVANCED DIRECTIVES: Y MOST FORM: no PPS: 40%   PHYSICAL EXAM:   VITALS: Today's Vitals   03/12/19 1103  BP: (!) 144/65  Pulse: (!) 50  Resp: 18  Temp: (!) 96.8 F (36 C)  TempSrc: Temporal  SpO2: 98%    LUNGS: clear to auscultation  CARDIAC: Cor Brady EXTREMITIES: No edema SKIN: Exposed skin is dry and intact  NEURO: Alert and oriented x 3, forgetful, pleasant mood, generalized weakness, ambulatory with walker (w/supervision)   (Duration of visit and documentation 75 minutes)   Daryl Eastern, RN BSN

## 2019-03-24 DIAGNOSIS — H401413 Capsular glaucoma with pseudoexfoliation of lens, right eye, severe stage: Secondary | ICD-10-CM | POA: Diagnosis not present

## 2019-03-24 DIAGNOSIS — H01024 Squamous blepharitis left upper eyelid: Secondary | ICD-10-CM | POA: Diagnosis not present

## 2019-03-24 DIAGNOSIS — H01021 Squamous blepharitis right upper eyelid: Secondary | ICD-10-CM | POA: Diagnosis not present

## 2019-03-24 DIAGNOSIS — H401421 Capsular glaucoma with pseudoexfoliation of lens, left eye, mild stage: Secondary | ICD-10-CM | POA: Diagnosis not present

## 2019-04-17 ENCOUNTER — Other Ambulatory Visit: Payer: Medicare Other | Admitting: *Deleted

## 2019-04-17 ENCOUNTER — Other Ambulatory Visit: Payer: Medicare Other | Admitting: Licensed Clinical Social Worker

## 2019-04-17 ENCOUNTER — Other Ambulatory Visit: Payer: Self-pay

## 2019-04-17 DIAGNOSIS — Z515 Encounter for palliative care: Secondary | ICD-10-CM | POA: Diagnosis not present

## 2019-04-18 NOTE — Progress Notes (Signed)
COMMUNITY PALLIATIVE CARE SW NOTE  PATIENT NAME: [name] DOB: 06/27/1921 MRN: [id number]  PRIMARY CARE PROVIDER: Wenda Low, MD  RESPONSIBLE PARTY:  Acct ID - Guarantor Home Phone Work Phone Relationship Acct Type  [id number] [name]  Self P/F     [place] [employer] [place], [place], [place] [address]     PLAN OF CARE and INTERVENTIONS:             1. GOALS OF CARE/ ADVANCE CARE PLANNING:  Goal is for patient to remain in her home.  She is a DNR. 2. SOCIAL/EMOTIONAL/SPIRITUAL ASSESSMENT/ INTERVENTIONS:  SW and Palliative Care RN, [name], met with patient and her caregiver, [name].  Patient's daughter and her family are on vacation currently, and [name]'s daughter is the caregiver in the evenings.  Patient denied pain.  She appeared to have difficulty finding words at times or understanding what was being asked of her.  SW and and RN informed patient that a team from the [facility] would be providing care in the future and they stated they understood. 3. PATIENT/CAREGIVER EDUCATION/ COPING:  Patient and her family express their feelings openly. 4. PERSONAL EMERGENCY PLAN:  She is self aware and will rest as needed.  Her caregivers will contact her daughter, [name], for emergencies. 5. COMMUNITY RESOURCES COORDINATION/ HEALTH CARE NAVIGATION:  She has private caregivers. 6. FINANCIAL/LEGAL CONCERNS/INTERVENTIONS:  None.     SOCIAL HX:  Social History   Tobacco Use  . Smoking status: Never Smoker  . Smokeless tobacco: Never Used  Substance Use Topics  . Alcohol use: No    CODE STATUS:  DNR ADVANCED DIRECTIVES: Yes MOST FORM COMPLETE:  No HOSPICE EDUCATION PROVIDED:  No PPS:  Patient's appetite is normal.  She ambulates with her walker. Duration of visit and documentation:  75 minutes.      Marissa Corn Matyas Baisley, LCSW

## 2019-04-23 NOTE — Progress Notes (Signed)
COMMUNITY PALLIATIVE CARE RN NOTE  PATIENT NAME: [name] DOB: 09-Feb-1921 MRN: [id number]  PRIMARY CARE PROVIDER: Wenda Low, MD  RESPONSIBLE PARTY:  Acct ID - Guarantor Home Phone Work Phone Relationship Acct Type  [id number] [name]606 [phone]  Self P/F     [name], [name], [name] [address]   Covid-19 Pre-screening Negative  PLAN OF CARE and INTERVENTION:  1. ADVANCE CARE PLANNING/GOALS OF CARE: Goal is for patient to remain in her home with hired caregivers.  2. PATIENT/CAREGIVER EDUCATION: Safe Mobility/Transfers 3. DISEASE STATUS: Joint visit made with Palliative Care SW, Lynn Duffy. Met with patient and her hired cg, [name], in patient's home. She is sitting on the couch awake and alert. Remains pleasant and engaging. Forgetful at times w/some word finding difficulties. She denies pain at this time, but does experiencing pain in her lower back at times. She takes Tylenol at bedtime routinely and PRN during the day. She is ambulatory using her walker w/supervision. When alone, she transfers herself to her wheelchair and self propels throughout her home. She is able to bathe/dress herself with minimal assistance. No recent falls. Her intake is good. Denies any recent issues of nausea/vomiting. She has been drinking more fluids for the past month to improve her hydration status. Denies dysphagia and takes her medications with Ensure. She is continent of both bladder and bowel. LBM this morning. She does take naps during the day and sleeps well throughout the night. She remains able to go the her hair salon weekly. Will continue to monitor   HISTORY OF PRESENT ILLNESS:  This is a 85 yo female who resides in her home and has hired caregivers twice daily. Palliative care team continues to follow patient. Patient has been transitioned to our [place] team but will continue to be seen monthly and PRN.  CODE STATUS: DNR  ADVANCED DIRECTIVES: Y MOST FORM: no PPS:  40%   PHYSICAL EXAM:   VITALS: Today's Vitals   04/17/19 1106  BP: (!) 161/75  Pulse: (!) 53  Resp: 18  Temp: (!) 96.7 F (35.9 C)  TempSrc: Temporal  SpO2: 97%  PainSc: 0-No pain    LUNGS: clear to auscultation  CARDIAC: Cor Loletha Grayer EXTREMITIES: No edema SKIN: Exposed skin is dry and intact  NEURO: Alert and oriented x 3, forgetful, generalized weakness, ambulatory with walker and supervision   (Duration of visit and documentation 75 minutes)    Daryl Eastern, RN BSN

## 2019-04-29 DIAGNOSIS — H409 Unspecified glaucoma: Secondary | ICD-10-CM | POA: Diagnosis not present

## 2019-04-29 DIAGNOSIS — E039 Hypothyroidism, unspecified: Secondary | ICD-10-CM | POA: Diagnosis not present

## 2019-04-29 DIAGNOSIS — G459 Transient cerebral ischemic attack, unspecified: Secondary | ICD-10-CM | POA: Diagnosis not present

## 2019-04-29 DIAGNOSIS — D649 Anemia, unspecified: Secondary | ICD-10-CM | POA: Diagnosis not present

## 2019-04-29 DIAGNOSIS — I10 Essential (primary) hypertension: Secondary | ICD-10-CM | POA: Diagnosis not present

## 2019-04-29 DIAGNOSIS — M1990 Unspecified osteoarthritis, unspecified site: Secondary | ICD-10-CM | POA: Diagnosis not present

## 2019-04-29 DIAGNOSIS — N183 Chronic kidney disease, stage 3 (moderate): Secondary | ICD-10-CM | POA: Diagnosis not present

## 2019-04-29 DIAGNOSIS — E782 Mixed hyperlipidemia: Secondary | ICD-10-CM | POA: Diagnosis not present

## 2019-04-29 DIAGNOSIS — M810 Age-related osteoporosis without current pathological fracture: Secondary | ICD-10-CM | POA: Diagnosis not present

## 2019-04-29 DIAGNOSIS — G450 Vertebro-basilar artery syndrome: Secondary | ICD-10-CM | POA: Diagnosis not present

## 2019-04-29 DIAGNOSIS — F324 Major depressive disorder, single episode, in partial remission: Secondary | ICD-10-CM | POA: Diagnosis not present

## 2019-05-02 DIAGNOSIS — E46 Unspecified protein-calorie malnutrition: Secondary | ICD-10-CM | POA: Diagnosis not present

## 2019-05-02 DIAGNOSIS — I739 Peripheral vascular disease, unspecified: Secondary | ICD-10-CM | POA: Diagnosis not present

## 2019-05-02 DIAGNOSIS — E782 Mixed hyperlipidemia: Secondary | ICD-10-CM | POA: Diagnosis not present

## 2019-05-02 DIAGNOSIS — F324 Major depressive disorder, single episode, in partial remission: Secondary | ICD-10-CM | POA: Diagnosis not present

## 2019-05-02 DIAGNOSIS — I471 Supraventricular tachycardia: Secondary | ICD-10-CM | POA: Diagnosis not present

## 2019-05-02 DIAGNOSIS — G450 Vertebro-basilar artery syndrome: Secondary | ICD-10-CM | POA: Diagnosis not present

## 2019-05-02 DIAGNOSIS — E039 Hypothyroidism, unspecified: Secondary | ICD-10-CM | POA: Diagnosis not present

## 2019-05-02 DIAGNOSIS — I10 Essential (primary) hypertension: Secondary | ICD-10-CM | POA: Diagnosis not present

## 2019-05-02 DIAGNOSIS — N183 Chronic kidney disease, stage 3 (moderate): Secondary | ICD-10-CM | POA: Diagnosis not present

## 2019-05-02 DIAGNOSIS — G459 Transient cerebral ischemic attack, unspecified: Secondary | ICD-10-CM | POA: Diagnosis not present

## 2019-05-05 ENCOUNTER — Telehealth: Payer: Self-pay

## 2019-05-05 NOTE — Telephone Encounter (Signed)
Telephone call to patient to schedule home visit with patient. SW spoke with [name] (patient's caregiver). [name] in agreement with palliative care visit on 05-12-19 at 11:00 AM.

## 2019-05-12 ENCOUNTER — Other Ambulatory Visit: Payer: Medicare Other

## 2019-05-12 ENCOUNTER — Other Ambulatory Visit: Payer: Self-pay

## 2019-05-12 DIAGNOSIS — Z515 Encounter for palliative care: Secondary | ICD-10-CM

## 2019-05-12 NOTE — Progress Notes (Signed)
PATIENT NAME: [name] DOB: 1921/01/07 MRN: [id number]  PRIMARY CARE PROVIDER: Wenda Low, MD  RESPONSIBLE PARTY:  Acct ID - Guarantor Home Phone Work Phone Relationship Acct Type  [id number] [name]  Self P/F     [place], [name], [place] [address]    PLAN OF CARE and INTERVENTIONS:               1.  GOALS OF CARE/ ADVANCE CARE PLANNING:  Remain at home with hired caregiver and daughter assisting with care.               2.  PATIENT/CAREGIVER EDUCATION:  Education on fall precautions, education on s/s of infection, education and support.               3.  DISEASE STATUS: SW and RN made joint home visit. Patient sitting on sofa, hired caregiver [name] in home with patient. Patient reports she has been "doing pretty well." Patient reports she has intermittent back and neck pain. Patient reports she takes Tylenol for pain and does get adequate relief. Patient denies pain at the present time. Patient denies suffering any recent falls. Patient ambulates with her walker and 1 person assist when family or caregiver is in the home but uses wheelchair when she is in home alone.  Patients weight today 103 lb. Patient had virtual visit with primary MD last week. Caregiver [name] reports MD made no changes in patients medications. Patient states she is planning on receiving flu vaccine.  Patient reports her appetite is good and patient denies having any nausea or vomiting. Caregiver reports patient complains of feeling cold all the time. Patient reports she has been sleeping well at night and naps during the day. Caregiver reports patient has not suffered a UTI in approximately 2 months. Caregiver reports when patient does have a UTI patient gets confused.  Patient has difficulty with word recall.  Patient and caregiver deny having any concerns at the present time. Patient has a DNR in the home. Patients daughter [name] who lives next door to patient is patients health care  power-of-attorney. Patient talks about working for [employer] and traveling to [place] to work for 2 years with [employer] prior to getting married. Patient and caregiver verbalize appreciation for palliative visit. Patient and caregiver encouraged to contact palliative care with questions or concerns.      HISTORY OF PRESENT ILLNESS: Patient is a 85 year old patient who resides in her own home with daughter [name] checking on patient in the evenings and hired caregiver [name] in the home with patient during the day. Palliative Care team continues to follow patient.      CODE STATUS: DNR  ADVANCED DIRECTIVES: Y MOST FORM: No PPS: 50%   PHYSICAL EXAM:   VITALS: See Vital signs  LUNGS: clear to auscultation  CARDIAC: Cor RRR  EXTREMITIES: Trace edema SKIN: Skin color, texture, turgor normal. No rashes or lesions  NEURO: positive for memory problems       [name], RN

## 2019-05-12 NOTE — Progress Notes (Signed)
COMMUNITY PALLIATIVE CARE SW NOTE  PATIENT NAME: [name] DOB: 16-May-1921 MRN: [id number]  PRIMARY CARE PROVIDER: Wenda Low, MD  RESPONSIBLE PARTY:  Acct ID - Guarantor Home Phone Work Phone Relationship Acct Type  [id number] Marissa Hones217 [phone]  Self P/F     [place], [name], [place] [address]     PLAN OF CARE and INTERVENTIONS:             1. GOALS OF CARE/ ADVANCE CARE PLANNING:  Patient is DNR. [name] said [name] is daughter, [name]. Goal is for patient to remain at home.  2. SOCIAL/EMOTIONAL/SPIRITUAL ASSESSMENT/ INTERVENTIONS:  SW and RN met with patient and [name], paid caregiver in the home. Patient was sitting up on couch, greeted team. Patient reports doing "good". Patient reports good appetite and said that she is sleeping well. Patient is a widow, has two adult daughters. Patient's daughter, [name] lives next door and brings patient dinner every night. Patient has five grandchildren and one great grandson. 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 Patient goes to [employer], and has a supportive pastor that visits often. Patient spends most of her time watching television, napping and doing puzzles with [name].  3. PATIENT/CAREGIVER EDUCATION/ COPING:  Patient was alert, oriented but forgetful at times. Patient is coping well. Patient expressed that she is pleased with how she is doing, happy to be at home.  4. PERSONAL EMERGENCY PLAN:  Caregiver/family will call 9-1-1 for emergencies. 5. COMMUNITY RESOURCES COORDINATION/ HEALTH CARE NAVIGATION:  [name] is the hired caregiver that is with patient each day. [name] has worked with patient for three years. [name] coordinates care and [name] helps manage appointments. No concerns.  6. FINANCIAL/LEGAL CONCERNS/INTERVENTIONS:  None.     SOCIAL HX:  Social History   Tobacco Use  . Smoking status: Never Smoker  . Smokeless tobacco: Never Used  Substance Use  Topics  . Alcohol use: No    CODE STATUS:   Code Status: Prior (DNR) ADVANCED DIRECTIVES: Y MOST FORM COMPLETE: No. HOSPICE EDUCATION PROVIDED: None.  PPS: Patient feeds herself. Patient does need assistance with bathing and dressing. She ambulates with her walker while someone is with her, but uses her wheelchair if she is home alone.  I spent 60 minutes with patient/family, from 11:00a-12:00p providing education, support and consultation.   Marissa Lombard, LCSW

## 2019-05-28 DIAGNOSIS — Z23 Encounter for immunization: Secondary | ICD-10-CM | POA: Diagnosis not present

## 2019-06-05 ENCOUNTER — Telehealth: Payer: Self-pay

## 2019-06-05 NOTE — Telephone Encounter (Signed)
Telephone call to patients home, sppke with caregiver [name].  [name] in agreement with palliative care visit on Tuesday at 11:00 AM 06-10-19.

## 2019-06-10 ENCOUNTER — Other Ambulatory Visit: Payer: Medicare Other

## 2019-06-10 ENCOUNTER — Other Ambulatory Visit: Payer: Self-pay

## 2019-06-10 DIAGNOSIS — Z515 Encounter for palliative care: Secondary | ICD-10-CM

## 2019-06-10 NOTE — Progress Notes (Signed)
COMMUNITY PALLIATIVE CARE SW NOTE  PATIENT NAME: [name] DOB: 09-10-1920 MRN: [id number]  PRIMARY CARE PROVIDER: Wenda Low, MD  RESPONSIBLE PARTY:  Acct ID - Guarantor Home Phone Work Phone Relationship Acct Type  [id number] [name]334 [phone]  Self P/F     [place], [name], [place] [address]     PLAN OF CARE and INTERVENTIONS:             1. GOALS OF CARE/ ADVANCE CARE PLANNING:  Patient is DNR. [name] said [name] is daughter, [name]. Goal is for patient to remain at home.  2. SOCIAL/EMOTIONAL/SPIRITUAL ASSESSMENT/ INTERVENTIONS:  SW and RN met with patient and Deloris, paid caregiver in the home. Patient was sitting on couch. Patient reports itching scalp. RN assessed. Patient denies pain. Patient reports good appetite. Patient said she is sleeping well, and naps during the day. Patient showed team a quilt that she made many years ago, and talked about her family and pictures in her home. 3. PATIENT/CAREGIVER EDUCATION/ COPING:  Patient was alert, engaged. Patient is forgetful. Patient is coping well, denies concerns. Patient enjoys puzzles and television. SW provided emotional support and used active and reflective listening. 4. PERSONAL EMERGENCY PLAN:  Caregiver/family will call 9-1-1 for emergencies. 5. COMMUNITY RESOURCES COORDINATION/ HEALTH CARE NAVIGATION:  [name] is the hired caregiver that is with patient each day. Deloris, [name]'s sister covers care when [name] is out of town. Patient has telehealth visit on Thursday. Patient wants palliative care team to call back to speak with [name] to schedule visit for next month. 6. FINANCIAL/LEGAL CONCERNS/INTERVENTIONS:  None.     SOCIAL HX:  Social History   Tobacco Use  . Smoking status: Never Smoker  . Smokeless tobacco: Never Used  Substance Use Topics  . Alcohol use: No    CODE STATUS:   Code Status: Prior (DNR) ADVANCED DIRECTIVES: Y MOST FORM COMPLETE:  No. HOSPICE EDUCATION PROVIDED: None.  PPS: Patient feeds  and toilets herself. Patient does need assistance with bathing. She ambulates with her walker while someone is there, but uses her wheelchair if she is alone.  I spent66mnutes with patient/family, from 11:00a-12:00pproviding education, support and consultation.  WMargaretmary Lombard LCSW

## 2019-06-10 NOTE — Progress Notes (Signed)
PATIENT NAME: [name] DOB: 11-29-1920 MRN: [id number]  PRIMARY CARE PROVIDER: Wenda Low, MD  RESPONSIBLE PARTY:  Acct ID - Guarantor Home Phone Work Phone Relationship Acct Type  [id number] [name]  Self P/F     [place], [place] [address]    PLAN OF CARE and INTERVENTIONS:               1.  GOALS OF CARE/ ADVANCE CARE PLANNING:  Patient wants to remain at home with hired caregivers and daughter checking on patient.               2.  PATIENT/CAREGIVER EDUCATION:  Education on fall precautions, education on s/s of infection, support               3.  DISEASE STATUS: SW and RN made joint home visit. Patient sitting on sofa. Patient's caregiver [name] in home with patient today. Patient pleasant and talkative. Patient denies having any pain but talks about having itching in her scalp. Patient reports son-in-law fixes her hair on Fridays. Education to patient and caregiver to request son-in-law apply Head and Shoulders and leave shampoo on scalp for 5 minutes before rinsing. Patient reports she has not suffered any falls. Patient continues to ambulate with her rolling walker while there is someone in the home with her but uses wheelchair when she is there alone. Patient continues to stay by herself at night. Patient reports her appetite is good. Patient has received flu shot for the season. Patient reports some shortness of breath at times on exertion. Patient reports she continues to have a cough that is worse in the mornings and in the evenings. Patient has guaifenesin in the home to take as needed for cough. Patient has no open areas of skin breakdown. Patient shares quilt she made years ago with palliative care team. Patient states talks about holidays being different for families this year. Support provided to patient. Patients daughter continues to bring patient her evening meal and check on patient at night. Patient and caregiver encouraged to contact  palliative care team with questions or concerns.      HISTORY OF PRESENT ILLNESS:  Patient is a 85 year old patient who resides in her own home with hired caregivers and daughter staying with patient.  Patient is being followed by palliative care team and seen monthly and PRN.   CODE STATUS: DNR  ADVANCED DIRECTIVES: Y MOST FORM:No PPS: 50%   PHYSICAL EXAM:   VITALS: Today's Vitals   06/10/19 1130  BP: 138/76  Pulse: (!) 53  Resp: 16  Temp: 97.9 F (36.6 C)  TempSrc: Temporal  SpO2: 98%  PainSc: 0-No pain    LUNGS: clear to auscultation  CARDIAC: Cor RRR  EXTREMITIES: Trace edema SKIN: Skin color, texture, turgor normal. No rashes or lesions  NEURO: positive for memory problems       [name], RN

## 2019-06-25 ENCOUNTER — Telehealth: Payer: Self-pay

## 2019-06-25 NOTE — Telephone Encounter (Signed)
Telephone call to patients home, spoke with patients caregiver [name].  [name] in agreement with palliatuve care home visit on 07-02-19 at 10:30 AM.

## 2019-07-02 ENCOUNTER — Other Ambulatory Visit: Payer: Medicare Other

## 2019-07-02 ENCOUNTER — Other Ambulatory Visit: Payer: Self-pay

## 2019-07-02 DIAGNOSIS — Z515 Encounter for palliative care: Secondary | ICD-10-CM

## 2019-07-02 NOTE — Progress Notes (Signed)
COMMUNITY PALLIATIVE CARE SW NOTE  PATIENT NAME: [name] DOB: [birth year] MRN: [id number]  PRIMARY CARE PROVIDER: Wenda Low, MD  RESPONSIBLE PARTY:  Acct ID - Guarantor Home Phone Work Phone Relationship Acct Type  [id number] [name]  Self P/F     [place], [name], [place] [address]     PLAN OF CARE and INTERVENTIONS:             1. GOALS OF CARE/ ADVANCE CARE PLANNING:  Patient is DNR.HCPOA is daughter, [name].Goal is for patient to remain at home. 2. SOCIAL/EMOTIONAL/SPIRITUAL ASSESSMENT/ INTERVENTIONS:  SW and RN met with patient and [name], paid caregiver in the home.Patient said she is doing "good". Patient denies pain. Patient continues to struggle with itchy scalp. RN discussed. Patient noted good appetite, [name] agreed. Patient is resting well and naps during the day. Patient said she did get hot last night and woke up sweating but that was the only time that this has happened. SW provided supportive counseling, discussed care and used active and reflective listening.  3. PATIENT/CAREGIVER EDUCATION/ COPING:  Patient was alert, oriented. Patient pleasant during conversation. Patient is forgetful at times. Patient indicates that she is coping well, denies concerns. Patient has supportive family. 4. PERSONAL EMERGENCY PLAN:  Caregiver/family will call 9-1-1 for emergencies. 5. COMMUNITY RESOURCES COORDINATION/ HEALTH CARE NAVIGATION:  [name] stays with patient during the day and daughter helps patient in the evenings. No concerns.  6. FINANCIAL/LEGAL CONCERNS/INTERVENTIONS:  None.     SOCIAL HX:  Social History   Tobacco Use  . Smoking status: Never Smoker  . Smokeless tobacco: Never Used  Substance Use Topics  . Alcohol use: No     CODE STATUS:   Code Status: Prior (DNR) ADVANCED DIRECTIVES: Y MOST FORM COMPLETE:  No. HOSPICE EDUCATION PROVIDED: None.  PPS: Patientis able to feeds and toilet herself. Patient needs assistance with bathing.  Patient ambulates with her walkerwhile someone is with her, but uses her wheelchair if she is home alone.  Due to patient's stability, patient will no longer be followed by [facility] team at this time. Patient will remain with [facility] team and be followed by NP. Patient agreeable to plan, no questions/concerns.   I spent79mnutes with patient/family, from10:30-11:15aproviding education, support and consultation.  WMargaretmary Lombard LCSW

## 2019-07-02 NOTE — Progress Notes (Signed)
PATIENT NAME: [name] DOB: 1921-05-05 MRN: [id number]  PRIMARY CARE PROVIDER: Wenda Low, MD  RESPONSIBLE PARTY:  Acct ID - Guarantor Home Phone Work Phone Relationship Acct Type  [id number] [name]  Self P/F     [place], [name], [name] [address]    PLAN OF CARE and INTERVENTIONS:               1.  GOALS OF CARE/ ADVANCE CARE PLANNING:  Remain at home with caregiver staying with patient during the day and daughter checking on patient at night.                2.  PATIENT/CAREGIVER EDUCATION:  Education on fall precautions, education on s/s of infection, support                4. PERSONAL EMERGENCY PLAN: Patient has life alert in place.                5.  DISEASE STATUS:  SW and RN made joint home care visit. Patient leaning to left side on sofa after receiving eye drops.  Patients hired caregiver [name] in home with patient. Patient reports she has intermittent pain in her neck that is worse when she lies down.   Patient takes Tylenol as needed for pain. Patient denies having suffering any recent falls. Hired caregiver [name]  report patient has not had any MD visits or had any changes in current medications. Patients report she did not sleep well last night due to it being warm in her home. Patients appetite remains good. Patient continues to ambulate with her walker and uses wheelchair if she is at home alone. Patient has a slight temperature 99.2. Education to caregiver to administer Tylenol to patient. Patient breath sounds are clear and patient denies having any shortness of breath. Patient reports she has a chronic cough.  Caregiver [name] reports patient has been taking Mucinex as needed. Patient reports her scalp remains itchy even though she shampooed her hair with Head and Shoulders shampoo. Education to caregiver to ask hair stylist to try shampoo with tea tree oil. Patient caregiver in agreement with patient not receiving monthly visits and NP checking on patient.  Patient and caregiver informed should patient have a change in status monthly visits could be resumed. Education to patient on fall precautions and signs and symptoms of infection. Patient and caregiver encouraged to contact palliative care with questions or concerns.    HISTORY OF PRESENT ILLNESS: Patient is a 85 year old feamale who resides in her own home.  Patient has been stable for several months.  Patient will ne transferred to NP and will not be receiving monthly visits by [facility] team unless patient has a decline in status.      CODE STATUS: DNR  ADVANCED DIRECTIVES: Y MOST FORM: No PPS: 50%   PHYSICAL EXAM:   VITALS: Today's Vitals   07/02/19 1112  BP: 112/72  Pulse: 60  Resp: 16  Temp: 99.2 F (37.3 C)  TempSrc: Temporal  SpO2: 94%  PainSc: 0-No pain    LUNGS: clear to auscultation  CARDIAC: Cor RRR  EXTREMITIES: Trace edema SKIN: Skin color, texture, turgor normal. No rashes or lesions  NEURO: positive for memory problems       [name], RN

## 2019-07-10 DIAGNOSIS — F324 Major depressive disorder, single episode, in partial remission: Secondary | ICD-10-CM | POA: Diagnosis not present

## 2019-07-10 DIAGNOSIS — N1831 Chronic kidney disease, stage 3a: Secondary | ICD-10-CM | POA: Diagnosis not present

## 2019-07-10 DIAGNOSIS — E039 Hypothyroidism, unspecified: Secondary | ICD-10-CM | POA: Diagnosis not present

## 2019-07-10 DIAGNOSIS — G459 Transient cerebral ischemic attack, unspecified: Secondary | ICD-10-CM | POA: Diagnosis not present

## 2019-07-10 DIAGNOSIS — D649 Anemia, unspecified: Secondary | ICD-10-CM | POA: Diagnosis not present

## 2019-07-10 DIAGNOSIS — H409 Unspecified glaucoma: Secondary | ICD-10-CM | POA: Diagnosis not present

## 2019-07-10 DIAGNOSIS — E782 Mixed hyperlipidemia: Secondary | ICD-10-CM | POA: Diagnosis not present

## 2019-07-10 DIAGNOSIS — M1990 Unspecified osteoarthritis, unspecified site: Secondary | ICD-10-CM | POA: Diagnosis not present

## 2019-07-10 DIAGNOSIS — M810 Age-related osteoporosis without current pathological fracture: Secondary | ICD-10-CM | POA: Diagnosis not present

## 2019-07-10 DIAGNOSIS — G450 Vertebro-basilar artery syndrome: Secondary | ICD-10-CM | POA: Diagnosis not present

## 2019-07-10 DIAGNOSIS — I10 Essential (primary) hypertension: Secondary | ICD-10-CM | POA: Diagnosis not present

## 2019-08-14 ENCOUNTER — Telehealth: Payer: Self-pay | Admitting: Hospice

## 2019-08-14 NOTE — Telephone Encounter (Signed)
Spoke with patient's daughter [name] and have scheduled a  Telephone Palliative f/u visit with NP on 09/03/19 @ 12 Noon.

## 2019-09-03 ENCOUNTER — Other Ambulatory Visit: Payer: Self-pay

## 2019-09-03 ENCOUNTER — Other Ambulatory Visit: Payer: Medicare Other | Admitting: Hospice

## 2019-09-03 DIAGNOSIS — G8929 Other chronic pain: Secondary | ICD-10-CM

## 2019-09-03 DIAGNOSIS — Z515 Encounter for palliative care: Secondary | ICD-10-CM

## 2019-09-03 DIAGNOSIS — M5440 Lumbago with sciatica, unspecified side: Secondary | ICD-10-CM

## 2019-09-03 NOTE — Progress Notes (Signed)
[facility] Consult Note Telephone: [phone]  Fax: [phone]  PATIENT NAME: [name] DOB: [birth year] MRN: [id number]  PRIMARY CARE PROVIDER:   Wenda Low, MD  REFERRING PROVIDER:  Wenda Low, MD [address] [place],  [place] [address]  RESPONSIBLE PARTY:   Self [phone] Emergency contact - Daughter- [name]   TELEHEALTH VISIT STATEMENT Due to the COVID-19 crisis, this visit was done via telephone from my office. It was initiated and consented to by this patient and/or family.  RECOMMENDATIONS/PLAN:   Advance Care Planning/Goals of Care: Telehealth Visit consisted of building trust and discussions on Palliative Medicine as specialized medical care for people living with serious illness, aimed at facilitating better quality of life through symptoms relief, assisting with advance care plan and establishing goals of care. Patient affirmed she is a DNR.Goals of care include to maximize quality of life and symptom management. Symptom management: Patient denied pain but reported it is like she has a lump in her throat and she coughs sometimes; this is chronic. Encouraged use of her GERD, cough and allergy medications. She and [name] verbalized understanding and knows to report unalleviated symptoms. She is ambulatory with her rolling walker, propels herself in her wheelchair around home sometimes, continent of bowel and bladder. Patient is able to communicate her needs; loves to work on puzzles. In no acute distress. Encouraged ongoing supportive care provided by [name] (caregiver) and daughter who lives next door.  Follow up: Palliative care will continue to follow patient for goals of care clarification and symptom management. I spent 60  minutes providing this consultation, from 12pm to 1pm.  More than 50% of the time in this consultation was spent on chart review and coordinating communication HISTORY OF  PRESENT ILLNESS:  [name] is a 86 y.o. year old female with multiple medical problems including GERD chronic low back pain, HTN Hypothyroidism. Palliative Care was asked to help address goals of care.   CODE STATUS: DNR  PPS: 40% HOSPICE ELIGIBILITY/DIAGNOSIS: TBD  PAST MEDICAL HISTORY:  Past Medical History:  Diagnosis Date  . Anemia   . Anginal pain ([place])   . Arthritis    "all over; doesn't seem to bother me much" (08/22/2013)  . Chronic lower back pain   . Dysrhythmia    "irregular heart beat"   . Exertional shortness of breath   . GERD (gastroesophageal reflux disease)   . Hypertension   . Hypothyroidism   . Migraines    "used to"  . Mixed hyperlipidemia   . Osteoporosis   . Paroxysmal supraventricular tachycardia ([place])   . Skin cancer    "couple taken off right leg" (08/22/2013)    SOCIAL HX:  Social History   Tobacco Use  . Smoking status: Never Smoker  . Smokeless tobacco: Never Used  Substance Use Topics  . Alcohol use: No    ALLERGIES:  Allergies  Allergen Reactions  . Codeine Other (See Comments)    "I feel like I'm floating"   . Gabapentin Other (See Comments)    Cannot be on for a good length of time- causes a lot of lethargy  . Other Other (See Comments)    All pain meds cause lethargy and disorientation     PERTINENT MEDICATIONS:  Outpatient Encounter Medications as of 09/03/2019  Medication Sig  . acetaminophen (TYLENOL) 500 MG tablet Take 2 tablets (1,000 mg total) by mouth 3 (three) times daily. X 3  DAYS (Patient taking differently: Take 1,000 mg by mouth at bedtime. )  . amLODipine (NORVASC) 5 MG tablet Take 1 tablet (5 mg total) by mouth daily.  . benzonatate (TESSALON) 100 MG capsule Take 1 capsule (100 mg total) by mouth 3 (three) times daily as needed for cough.  . bimatoprost (LUMIGAN) 0.01 % SOLN Place 1 drop into both eyes at bedtime.  Marland Kitchen BIOTIN PO Take 1 tablet by mouth daily.  . bisacodyl (DULCOLAX) 10 MG suppository Place 1  suppository (10 mg total) rectally daily as needed for moderate constipation. (Patient not taking: Reported on 11/28/2018)  . brimonidine (ALPHAGAN) 0.15 % ophthalmic solution Place 1 drop into the left eye 3 (three) times daily.  . Calcium Carb-Cholecalciferol (CALCIUM 600+D3 PO) Take 1 tablet by mouth 2 (two) times daily.   . clopidogrel (PLAVIX) 75 MG tablet Take 75 mg by mouth every other day.   . diltiazem (CARDIZEM CD) 120 MG 24 hr capsule Take 120 mg by mouth at bedtime.   . dorzolamide-timolol (COSOPT) 22.3-6.8 MG/ML ophthalmic solution Place 1 drop into the left eye 2 (two) times daily.  . feeding supplement, ENSURE ENLIVE, (ENSURE ENLIVE) LIQD Take 237 mLs by mouth 2 (two) times daily between meals.  . ferrous sulfate 325 (65 FE) MG EC tablet Take 325 mg by mouth every evening.  . fexofenadine (ALLEGRA) 180 MG tablet Take 180 mg by mouth daily.  Marland Kitchen gemfibrozil (LOPID) 600 MG tablet Take 600 mg by mouth 2 (two) times daily before a meal.  . hydrALAZINE (APRESOLINE) 25 MG tablet Take 2 tablets (50 mg total) by mouth 2 (two) times a day.  . levothyroxine (SYNTHROID, LEVOTHROID) 50 MCG tablet Take 25-50 mcg by mouth See admin instructions. Take 25 mcg by mouth in the morning before breakfast, alternating with 50 mcg (every other morning)  . loperamide (IMODIUM) 2 MG capsule Take 2-4 mg by mouth as needed for diarrhea or loose stools.   . metoprolol (LOPRESSOR) 50 MG tablet Take 50 mg by mouth 2 (two) times daily.  . nitroGLYCERIN (NITROSTAT) 0.4 MG SL tablet Place 1 tablet (0.4 mg total) under the tongue every 5 (five) minutes as needed for chest pain.  . Omega-3 Fatty Acids (FISH OIL) 1000 MG CAPS Take 1,000 mg by mouth 2 (two) times daily.   . pantoprazole (PROTONIX) 40 MG tablet Take 1 tablet (40 mg total) by mouth 2 (two) times daily before a meal. Take 2 times daily for 2 weeks, then 1 tablet daily/  . sertraline (ZOLOFT) 100 MG tablet Take 100 mg by mouth at bedtime.    No  facility-administered encounter medications on file as of 09/03/2019.     Teodoro Spray, NP

## 2019-09-18 ENCOUNTER — Emergency Department (HOSPITAL_COMMUNITY)
Admission: EM | Admit: 2019-09-18 | Discharge: 2019-09-18 | Disposition: A | Discharge status: Home / Self Care | Payer: Medicare Other | Attending: Emergency Medicine | Admitting: Emergency Medicine

## 2019-09-18 ENCOUNTER — Emergency Department (HOSPITAL_COMMUNITY): Payer: Medicare Other

## 2019-09-18 ENCOUNTER — Other Ambulatory Visit: Payer: Self-pay

## 2019-09-18 DIAGNOSIS — Y9389 Activity, other specified: Secondary | ICD-10-CM | POA: Diagnosis not present

## 2019-09-18 DIAGNOSIS — S42212A Unspecified displaced fracture of surgical neck of left humerus, initial encounter for closed fracture: Secondary | ICD-10-CM | POA: Diagnosis not present

## 2019-09-18 DIAGNOSIS — E039 Hypothyroidism, unspecified: Secondary | ICD-10-CM | POA: Insufficient documentation

## 2019-09-18 DIAGNOSIS — S199XXA Unspecified injury of neck, initial encounter: Secondary | ICD-10-CM | POA: Diagnosis not present

## 2019-09-18 DIAGNOSIS — Y998 Other external cause status: Secondary | ICD-10-CM | POA: Diagnosis not present

## 2019-09-18 DIAGNOSIS — Z79899 Other long term (current) drug therapy: Secondary | ICD-10-CM | POA: Diagnosis not present

## 2019-09-18 DIAGNOSIS — Y92013 Bedroom of single-family (private) house as the place of occurrence of the external cause: Secondary | ICD-10-CM | POA: Insufficient documentation

## 2019-09-18 DIAGNOSIS — W01190A Fall on same level from slipping, tripping and stumbling with subsequent striking against furniture, initial encounter: Secondary | ICD-10-CM | POA: Insufficient documentation

## 2019-09-18 DIAGNOSIS — Y92009 Unspecified place in unspecified non-institutional (private) residence as the place of occurrence of the external cause: Secondary | ICD-10-CM

## 2019-09-18 DIAGNOSIS — R609 Edema, unspecified: Secondary | ICD-10-CM | POA: Diagnosis not present

## 2019-09-18 DIAGNOSIS — S0990XA Unspecified injury of head, initial encounter: Secondary | ICD-10-CM | POA: Insufficient documentation

## 2019-09-18 DIAGNOSIS — W19XXXA Unspecified fall, initial encounter: Secondary | ICD-10-CM | POA: Diagnosis not present

## 2019-09-18 DIAGNOSIS — R52 Pain, unspecified: Secondary | ICD-10-CM | POA: Diagnosis not present

## 2019-09-18 DIAGNOSIS — M542 Cervicalgia: Secondary | ICD-10-CM | POA: Diagnosis not present

## 2019-09-18 DIAGNOSIS — S4992XA Unspecified injury of left shoulder and upper arm, initial encounter: Secondary | ICD-10-CM | POA: Diagnosis present

## 2019-09-18 DIAGNOSIS — I10 Essential (primary) hypertension: Secondary | ICD-10-CM | POA: Insufficient documentation

## 2019-09-18 DIAGNOSIS — G8929 Other chronic pain: Secondary | ICD-10-CM | POA: Insufficient documentation

## 2019-09-18 MED ORDER — ACETAMINOPHEN 500 MG PO TABS
1000.0000 mg | ORAL_TABLET | Freq: Once | ORAL | Status: AC
  Administered 2019-09-18: 1000 mg via ORAL
  Filled 2019-09-18: qty 2

## 2019-09-18 MED ORDER — AMLODIPINE BESYLATE 5 MG PO TABS
5.0000 mg | ORAL_TABLET | Freq: Once | ORAL | Status: AC
  Administered 2019-09-18: 5 mg via ORAL
  Filled 2019-09-18: qty 1

## 2019-09-18 MED ORDER — FENTANYL CITRATE (PF) 100 MCG/2ML IJ SOLN
25.0000 ug | Freq: Once | INTRAMUSCULAR | Status: AC
  Administered 2019-09-18: 25 ug via INTRAVENOUS
  Filled 2019-09-18: qty 2

## 2019-09-18 MED ORDER — HYDRALAZINE HCL 25 MG PO TABS
25.0000 mg | ORAL_TABLET | Freq: Once | ORAL | Status: AC
  Administered 2019-09-18: 25 mg via ORAL
  Filled 2019-09-18: qty 1

## 2019-09-18 NOTE — ED Triage Notes (Signed)
Pt arrives via ems after mechanical fall. Pt fell between bed and nightstand. Did not hit head. C/o pain and swelling to L shoulder. Pt on plavix. No LOC.  230/120 HR 77 96% RA 97.16F

## 2019-09-18 NOTE — ED Notes (Signed)
Got patient undress on the monitor patient is resting with call bell in reach 

## 2019-09-18 NOTE — ED Notes (Signed)
Patient transported to X-ray 

## 2019-09-18 NOTE — ED Provider Notes (Signed)
[facility] EMERGENCY DEPARTMENT Provider Note   CSN: [id number] Arrival date & time: 09/18/19  [id number]     History Chief Complaint  Patient presents with   [name] is a 86 y.o. female with past medical history of hypertension, osteoporosis, presenting to the emergency department via EMS after mechanical fall.  She states she just got her socks out of her nightstand and she was going to turn around when she fell down onto her left arm.  She states her left shoulder hit her mattress as well as her head.  No LOC.  She has chronic neck pain which feels to be about her baseline and not worsening.  She mostly has pain to her left shoulder that is worse with any movement.  No numbness or tingling in her extremities.  No headache.  She takes Plavix daily.  She states EMS gave her medications on the way was provided initial relief though states they are wearing off.  The history is provided by the patient.       Past Medical History:  Diagnosis Date   Anemia    Anginal pain ([place])    Arthritis    "all over; doesn't seem to bother me much" (08/22/2013)   Chronic lower back pain    Dysrhythmia    "irregular heart beat"    Exertional shortness of breath    GERD (gastroesophageal reflux disease)    Hypertension    Hypothyroidism    Migraines    "used to"   Mixed hyperlipidemia    Osteoporosis    Paroxysmal supraventricular tachycardia ([place])    Skin cancer    "couple taken off right leg" (08/22/2013)    Patient Active Problem List   Diagnosis Date Noted   E. coli UTI 12/04/2018   UTI due to Klebsiella species 12/04/2018   Abnormal magnetic resonance cholangiopancreatography (MRCP) 12/04/2018   Hypophosphatemia 12/04/2018   Hypokalemia 12/04/2018   Proctitis 12/04/2018   Hypomagnesemia 12/04/2018   AKI (acute kidney injury) ([place]) 11/28/2018   Viral gastroenteritis 11/28/2018   Generalized weakness 11/28/2018   Chronic  anemia 11/28/2018   Thrombocytopenia ([place]) 11/28/2018   Chronic bilateral low back pain 10/31/2016   Closed subcapital fracture of femur, left, initial encounter ([place]) 10/16/2016   Peripheral vascular disease (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 [name], MD;  Location: [place];  Service: General;  Laterality: N/A;   LUMBAR [place] SURGERY  2008   THROAT SURGERY  1940's; ?   "cysts removed"      OB History   No obstetric history on file.     No family history on file.  Social History   Tobacco Use   Smoking status: Never Smoker   Smokeless tobacco: Never Used  Substance Use Topics  Alcohol use: No   Drug use: No    Home Medications Prior to Admission medications   Medication Sig Start Date End Date Taking? Authorizing Provider  acetaminophen (TYLENOL) 500 MG tablet Take 2 tablets (1,000 mg total) by mouth 3 (three) times daily. X 3 DAYS Patient taking differently: Take 1,000 mg by mouth at bedtime.  10/20/16   Rai, Ripudeep K, MD  amLODipine (NORVASC) 5 MG tablet Take 1 tablet (5 mg total) by mouth daily. 12/12/18   Danford, Suann Larry, MD  benzonatate (TESSALON) 100 MG capsule Take 1 capsule (100 mg total) by mouth 3 (three) times daily as needed for cough.  12/09/18   Eugenie Filler, MD  bimatoprost (LUMIGAN) 0.01 % SOLN Place 1 drop into both eyes at bedtime.    [provider]  BIOTIN PO Take 1 tablet by mouth daily.    [provider]  bisacodyl (DULCOLAX) 10 MG suppository Place 1 suppository (10 mg total) rectally daily as needed for moderate constipation. Patient not taking: Reported on 11/28/2018 10/20/16   Rai, Vernelle Emerald, MD  brimonidine (ALPHAGAN) 0.15 % ophthalmic solution Place 1 drop into the left eye 3 (three) times daily.    [provider]  Calcium Carb-Cholecalciferol (CALCIUM 600+D3 PO) Take 1 tablet by mouth 2 (two) times daily.     [provider]  clopidogrel (PLAVIX) 75 MG tablet Take 75 mg by mouth every other day.  09/26/18   [provider]  diltiazem (CARDIZEM CD) 120 MG 24 hr capsule Take 120 mg by mouth at bedtime.  08/31/18   [provider]  dorzolamide-timolol (COSOPT) 22.3-6.8 MG/ML ophthalmic solution Place 1 drop into the left eye 2 (two) times daily. 05/07/14   [provider]  feeding supplement, ENSURE ENLIVE, (ENSURE ENLIVE) LIQD Take 237 mLs by mouth 2 (two) times daily between meals. 10/20/16   Rai, Ripudeep K, MD  ferrous sulfate 325 (65 FE) MG EC tablet Take 325 mg by mouth every evening.    [provider]  fexofenadine (ALLEGRA) 180 MG tablet Take 180 mg by mouth daily.    [provider]  gemfibrozil (LOPID) 600 MG tablet Take 600 mg by mouth 2 (two) times daily before a meal.    [provider]  hydrALAZINE (APRESOLINE) 25 MG tablet Take 2 tablets (50 mg total) by mouth 2 (two) times a day. 12/11/18   Danford, Suann Larry, MD  levothyroxine (SYNTHROID, LEVOTHROID) 50 MCG tablet Take 25-50 mcg by mouth See admin instructions. Take 25 mcg by mouth in the morning before breakfast, alternating with 50 mcg (every other morning)    [provider]  loperamide (IMODIUM) 2 MG capsule Take 2-4 mg by mouth as needed for  diarrhea or loose stools.     [provider]  metoprolol (LOPRESSOR) 50 MG tablet Take 50 mg by mouth 2 (two) times daily.    [provider]  nitroGLYCERIN (NITROSTAT) 0.4 MG SL tablet Place 1 tablet (0.4 mg total) under the tongue every 5 (five) minutes as needed for chest pain. 12/11/18   Danford, Suann Larry, MD  Omega-3 Fatty Acids (FISH OIL) 1000 MG CAPS Take 1,000 mg by mouth 2 (two) times daily.     [provider]  pantoprazole (PROTONIX) 40 MG tablet Take 1 tablet (40 mg total) by mouth 2 (two) times daily before a meal. Take 2 times daily for 2 weeks, then 1 tablet daily/ 12/09/18   Eugenie Filler, MD  sertraline (ZOLOFT)  100 MG tablet Take 100 mg by mouth at bedtime.     [provider]    Allergies    Codeine, Gabapentin, and Other  Review of Systems   Review of Systems  All other systems reviewed and are negative.   Physical Exam Updated Vital Signs BP (!) 166/59 (BP Location: Right Arm)    Pulse 61    Temp 98.3 F (36.8 C) (Oral)    Resp 15    SpO2 96%   Physical Exam Vitals and nursing note reviewed.  Constitutional:      General: She is not in acute distress.    Appearance: She is well-developed. She is not ill-appearing.  HENT:     Head: Normocephalic and atraumatic.     Comments: No scalp hematoma. Eyes:     Conjunctiva/sclera: Conjunctivae normal.  Cardiovascular:     Rate and Rhythm: Normal rate and regular rhythm.  Pulmonary:     Effort: Pulmonary effort is normal. No respiratory distress.     Breath sounds: Normal breath sounds.  Abdominal:     General: Bowel sounds are normal.     Palpations: Abdomen is soft.     Tenderness: There is no abdominal tenderness.  Musculoskeletal:     Cervical back: Normal range of motion and neck supple. No tenderness.     Comments: Left proximal arm/shoulder with swelling and deformity. Bruising is also present, no overlying wounds. Assoc TTP. Elbow and wrist are normal. Pt has  normal sensation and radial pulse to distal LUE.  Superficial abrasion to midline mid back, no tenderness or bruising.  No bony step-offs or gross deformities.  Skin:    General: Skin is warm.  Neurological:     Mental Status: She is alert and oriented to person, place, and time.  Psychiatric:        Behavior: Behavior normal.     ED Results / Procedures / Treatments   Labs (all labs ordered are listed, but only abnormal results are displayed) Labs Reviewed - No data to display  EKG None  Radiology CT Head Wo Contrast  Result Date: 09/18/2019 CLINICAL DATA:  Fall with trauma to the head and neck. EXAM: CT HEAD WITHOUT CONTRAST CT CERVICAL SPINE WITHOUT CONTRAST TECHNIQUE: Multidetector CT imaging of the head and cervical spine was performed following the standard protocol without intravenous contrast. Multiplanar CT image reconstructions of the cervical spine were also generated. COMPARISON:  None. FINDINGS: CT HEAD FINDINGS Brain: Age related generalized atrophy. Chronic small-vessel ischemic changes throughout the cerebral hemispheric white matter. No sign of acute infarction, mass lesion, hemorrhage, hydrocephalus or extra-axial collection. Vascular: There is atherosclerotic calcification of the major vessels at the base of the brain. Skull: No skull fracture. Sinuses/Orbits: Clear/normal Other: None CT CERVICAL SPINE FINDINGS Alignment: No traumatic malalignment. Skull base and vertebrae: Negative.  No regional fracture. Soft tissues and spinal canal: Negative.  No traumatic finding. Disc levels: Degenerative spondylosis from C3-4 through C5-6. No significant osteophytic narrowing of the canal or foramina. Upper chest: Negative Other: None IMPRESSION: Head CT: No acute or traumatic finding. Age related volume loss. Chronic small-vessel changes of the white matter. Cervical spine CT: No acute or traumatic finding. Fairly mild cervical spondylosis considering age. Electronically Signed   By:  Nelson Chimes M.D.   On: 09/18/2019 11:03   CT Cervical Spine Wo Contrast  Result Date: 09/18/2019 CLINICAL DATA:  Fall with trauma to the head and neck. EXAM: CT HEAD WITHOUT CONTRAST CT CERVICAL SPINE WITHOUT  CONTRAST TECHNIQUE: Multidetector CT imaging of the head and cervical spine was performed following the standard protocol without intravenous contrast. Multiplanar CT image reconstructions of the cervical spine were also generated. COMPARISON:  None. FINDINGS: CT HEAD FINDINGS Brain: Age related generalized atrophy. Chronic small-vessel ischemic changes throughout the cerebral hemispheric white matter. No sign of acute infarction, mass lesion, hemorrhage, hydrocephalus or extra-axial collection. Vascular: There is atherosclerotic calcification of the major vessels at the base of the brain. Skull: No skull fracture. Sinuses/Orbits: Clear/normal Other: None CT CERVICAL SPINE FINDINGS Alignment: No traumatic malalignment. Skull base and vertebrae: Negative.  No regional fracture. Soft tissues and spinal canal: Negative.  No traumatic finding. Disc levels: Degenerative spondylosis from C3-4 through C5-6. No significant osteophytic narrowing of the canal or foramina. Upper chest: Negative Other: None IMPRESSION: Head CT: No acute or traumatic finding. Age related volume loss. Chronic small-vessel changes of the white matter. Cervical spine CT: No acute or traumatic finding. Fairly mild cervical spondylosis considering age. Electronically Signed   By: Nelson Chimes M.D.   On: 09/18/2019 11:03   DG Shoulder Left  Result Date: 09/18/2019 CLINICAL DATA:  Left shoulder injury due to a fall today. Initial encounter. EXAM: LEFT SHOULDER - 2+ VIEW COMPARISON:  None. FINDINGS: The patient has an acute surgical neck fracture of the left humerus with approximately 1/2 shaft width anterior displacement and mild impaction. The fracture involves the greater tuberosity which is mildly displaced. The humerus is located and  the acromioclavicular joint is intact. No lytic or sclerotic lesion. IMPRESSION: Acute surgical neck fracture left humerus extending into the greater tuberosity as described above. Electronically Signed   By: Inge Rise M.D.   On: 09/18/2019 10:35   DG Humerus Left  Result Date: 09/18/2019 CLINICAL DATA:  Left upper arm and shoulder pain due to an injury suffered in a fall today. Initial encounter. EXAM: LEFT HUMERUS - 2+ VIEW COMPARISON:  None. FINDINGS: The patient has an acute surgical neck fracture of the left humerus. No other acute bony or joint abnormality is identified. IMPRESSION: Acute surgical neck fracture left humerus. Please see report of dedicated plain films of the shoulder this same day. Electronically Signed   By: Inge Rise M.D.   On: 09/18/2019 10:32    Procedures Procedures (including critical care time)  Medications Ordered in ED Medications  fentaNYL (SUBLIMAZE) injection 25 mcg (25 mcg Intravenous Given 09/18/19 1003)  acetaminophen (TYLENOL) tablet 1,000 mg (1,000 mg Oral Given 09/18/19 1101)  hydrALAZINE (APRESOLINE) tablet 25 mg (25 mg Oral Given 09/18/19 1151)  amLODipine (NORVASC) tablet 5 mg (5 mg Oral Given 09/18/19 1151)    ED Course  I have reviewed the triage vital signs and the nursing notes.  Pertinent labs & imaging results that were available during my care of the patient were reviewed by me and considered in my medical decision making (see chart for details).  Clinical Course as of Sep 17 1209  Thu Sep 18, 2019  1043 Ortho PA recommends sling and outpt f/u with Dr. Griffin Basil in 1 week.  DG Humerus Left [JR]    Clinical Course User Index [JR] Rankin Coolman, [place] N, PA-C   MDM Rules/Calculators/A&P                      Patient presenting from home after mechanical fall that occurred prior to arrival.  Patient states when she turned around she fell with her left shoulder hitting the edge of the mattress.  She  states that her head did hit the  mattress as well, no LOC.  She is on Plavix though no other anticoagulation.  She has chronic neck pain that is unchanged from baseline.  No other injuries reported.  She is alert and oriented.  Left proximal humerus with obvious deformity and swelling.  No wound.  Normal sensation and pulses.  Imaging obtained which shows displaced surgical neck fracture of the left humerus.  This was reviewed by Ortho PA, Hilbert Odor, who recommends sling and outpatient follow-up with Dr. Griffin Basil.  CT head and C-spine are negative.  Patient was hypertensive on arrival though did not take any of her blood pressure medications this morning prior to coming in.  Her blood pressure did improve with pain management though she was given home dose of her blood pressure medicine prior to discharge.  Pain is adequately managed.  Her daughter was updated over the phone as well as patient regarding symptomatic management to include ice, Tylenol, sling immobilization, and outpatient follow-up with orthopedics next week.  Referral provided.  Strict return precautions.  Patient and her daughter verbalized understanding agrees with care plan for discharge.  Patient discussed with Dr. Alvino Chapel, who agrees with work-up and care plan at this time.  Discussed results, findings, treatment and follow up. Patient advised of return precautions. Patient verbalized understanding and agreed with plan.   Final Clinical Impression(s) / ED Diagnoses Final diagnoses:  Closed displaced fracture of surgical neck of left humerus, unspecified fracture morphology, initial encounter  Fall in home, initial encounter    Rx / DC Orders ED Discharge Orders    None       Maryori Weide, [place] N, PA-C 09/18/19 1216    Davonna Belling, MD 09/18/19 1551

## 2019-09-18 NOTE — Discharge Instructions (Addendum)
The CT of your head and neck show no evidence of acute injury. Keep your arm in a sling for immobilization and comfort. You may feel more comfortable sleeping upright. Take tylenol, up to 1000mg  every 6 hours. Do not take more than 4000mg  in a 24 hour period. Apply ice to your arm for 20 minute at a time, frequently throughout the day to help with pain and swelling. Schedule an appointment for next week with Dr. Griffin Basil for repeat xrays and further management of your arm fracture. Return to the ER for new or concerning symptoms. You have been given you more dose of hydralazine and amlodipine here in the ER. You have also been given 1000mg  of tylenol.

## 2019-09-18 NOTE — ED Notes (Signed)
Patient Alert and oriented to baseline. Stable and ambulatory to baseline. Patient verbalized understanding of the discharge instructions.  Patient belongings were taken by the patient.   

## 2019-09-26 DIAGNOSIS — S42295A Other nondisplaced fracture of upper end of left humerus, initial encounter for closed fracture: Secondary | ICD-10-CM | POA: Diagnosis not present

## 2019-10-11 DIAGNOSIS — H409 Unspecified glaucoma: Secondary | ICD-10-CM | POA: Diagnosis not present

## 2019-10-11 DIAGNOSIS — G43909 Migraine, unspecified, not intractable, without status migrainosus: Secondary | ICD-10-CM | POA: Diagnosis not present

## 2019-10-11 DIAGNOSIS — Z85828 Personal history of other malignant neoplasm of skin: Secondary | ICD-10-CM | POA: Diagnosis not present

## 2019-10-11 DIAGNOSIS — I739 Peripheral vascular disease, unspecified: Secondary | ICD-10-CM | POA: Diagnosis not present

## 2019-10-11 DIAGNOSIS — D649 Anemia, unspecified: Secondary | ICD-10-CM | POA: Diagnosis not present

## 2019-10-11 DIAGNOSIS — E039 Hypothyroidism, unspecified: Secondary | ICD-10-CM | POA: Diagnosis not present

## 2019-10-11 DIAGNOSIS — Z9181 History of falling: Secondary | ICD-10-CM | POA: Diagnosis not present

## 2019-10-11 DIAGNOSIS — I10 Essential (primary) hypertension: Secondary | ICD-10-CM | POA: Diagnosis not present

## 2019-10-11 DIAGNOSIS — M1990 Unspecified osteoarthritis, unspecified site: Secondary | ICD-10-CM | POA: Diagnosis not present

## 2019-10-11 DIAGNOSIS — K219 Gastro-esophageal reflux disease without esophagitis: Secondary | ICD-10-CM | POA: Diagnosis not present

## 2019-10-11 DIAGNOSIS — M80022D Age-related osteoporosis with current pathological fracture, left humerus, subsequent encounter for fracture with routine healing: Secondary | ICD-10-CM | POA: Diagnosis not present

## 2019-10-11 DIAGNOSIS — E782 Mixed hyperlipidemia: Secondary | ICD-10-CM | POA: Diagnosis not present

## 2019-10-13 DIAGNOSIS — G43909 Migraine, unspecified, not intractable, without status migrainosus: Secondary | ICD-10-CM | POA: Diagnosis not present

## 2019-10-13 DIAGNOSIS — M80022D Age-related osteoporosis with current pathological fracture, left humerus, subsequent encounter for fracture with routine healing: Secondary | ICD-10-CM | POA: Diagnosis not present

## 2019-10-13 DIAGNOSIS — E039 Hypothyroidism, unspecified: Secondary | ICD-10-CM | POA: Diagnosis not present

## 2019-10-13 DIAGNOSIS — I739 Peripheral vascular disease, unspecified: Secondary | ICD-10-CM | POA: Diagnosis not present

## 2019-10-13 DIAGNOSIS — I10 Essential (primary) hypertension: Secondary | ICD-10-CM | POA: Diagnosis not present

## 2019-10-13 DIAGNOSIS — H409 Unspecified glaucoma: Secondary | ICD-10-CM | POA: Diagnosis not present

## 2019-10-14 DIAGNOSIS — I739 Peripheral vascular disease, unspecified: Secondary | ICD-10-CM | POA: Diagnosis not present

## 2019-10-14 DIAGNOSIS — H409 Unspecified glaucoma: Secondary | ICD-10-CM | POA: Diagnosis not present

## 2019-10-14 DIAGNOSIS — M80022D Age-related osteoporosis with current pathological fracture, left humerus, subsequent encounter for fracture with routine healing: Secondary | ICD-10-CM | POA: Diagnosis not present

## 2019-10-14 DIAGNOSIS — G43909 Migraine, unspecified, not intractable, without status migrainosus: Secondary | ICD-10-CM | POA: Diagnosis not present

## 2019-10-14 DIAGNOSIS — E039 Hypothyroidism, unspecified: Secondary | ICD-10-CM | POA: Diagnosis not present

## 2019-10-14 DIAGNOSIS — I10 Essential (primary) hypertension: Secondary | ICD-10-CM | POA: Diagnosis not present

## 2019-10-16 DIAGNOSIS — M80022D Age-related osteoporosis with current pathological fracture, left humerus, subsequent encounter for fracture with routine healing: Secondary | ICD-10-CM | POA: Diagnosis not present

## 2019-10-16 DIAGNOSIS — H409 Unspecified glaucoma: Secondary | ICD-10-CM | POA: Diagnosis not present

## 2019-10-16 DIAGNOSIS — I10 Essential (primary) hypertension: Secondary | ICD-10-CM | POA: Diagnosis not present

## 2019-10-16 DIAGNOSIS — E039 Hypothyroidism, unspecified: Secondary | ICD-10-CM | POA: Diagnosis not present

## 2019-10-16 DIAGNOSIS — I739 Peripheral vascular disease, unspecified: Secondary | ICD-10-CM | POA: Diagnosis not present

## 2019-10-16 DIAGNOSIS — G43909 Migraine, unspecified, not intractable, without status migrainosus: Secondary | ICD-10-CM | POA: Diagnosis not present

## 2019-10-17 DIAGNOSIS — I10 Essential (primary) hypertension: Secondary | ICD-10-CM | POA: Diagnosis not present

## 2019-10-17 DIAGNOSIS — D649 Anemia, unspecified: Secondary | ICD-10-CM | POA: Diagnosis not present

## 2019-10-17 DIAGNOSIS — G459 Transient cerebral ischemic attack, unspecified: Secondary | ICD-10-CM | POA: Diagnosis not present

## 2019-10-17 DIAGNOSIS — M1990 Unspecified osteoarthritis, unspecified site: Secondary | ICD-10-CM | POA: Diagnosis not present

## 2019-10-17 DIAGNOSIS — M810 Age-related osteoporosis without current pathological fracture: Secondary | ICD-10-CM | POA: Diagnosis not present

## 2019-10-17 DIAGNOSIS — F324 Major depressive disorder, single episode, in partial remission: Secondary | ICD-10-CM | POA: Diagnosis not present

## 2019-10-17 DIAGNOSIS — G450 Vertebro-basilar artery syndrome: Secondary | ICD-10-CM | POA: Diagnosis not present

## 2019-10-17 DIAGNOSIS — N183 Chronic kidney disease, stage 3 unspecified: Secondary | ICD-10-CM | POA: Diagnosis not present

## 2019-10-17 DIAGNOSIS — H409 Unspecified glaucoma: Secondary | ICD-10-CM | POA: Diagnosis not present

## 2019-10-17 DIAGNOSIS — E039 Hypothyroidism, unspecified: Secondary | ICD-10-CM | POA: Diagnosis not present

## 2019-10-17 DIAGNOSIS — E782 Mixed hyperlipidemia: Secondary | ICD-10-CM | POA: Diagnosis not present

## 2019-10-20 DIAGNOSIS — G43909 Migraine, unspecified, not intractable, without status migrainosus: Secondary | ICD-10-CM | POA: Diagnosis not present

## 2019-10-20 DIAGNOSIS — H409 Unspecified glaucoma: Secondary | ICD-10-CM | POA: Diagnosis not present

## 2019-10-20 DIAGNOSIS — I739 Peripheral vascular disease, unspecified: Secondary | ICD-10-CM | POA: Diagnosis not present

## 2019-10-20 DIAGNOSIS — E039 Hypothyroidism, unspecified: Secondary | ICD-10-CM | POA: Diagnosis not present

## 2019-10-20 DIAGNOSIS — M80022D Age-related osteoporosis with current pathological fracture, left humerus, subsequent encounter for fracture with routine healing: Secondary | ICD-10-CM | POA: Diagnosis not present

## 2019-10-20 DIAGNOSIS — I10 Essential (primary) hypertension: Secondary | ICD-10-CM | POA: Diagnosis not present

## 2019-10-21 DIAGNOSIS — S42295D Other nondisplaced fracture of upper end of left humerus, subsequent encounter for fracture with routine healing: Secondary | ICD-10-CM | POA: Diagnosis not present

## 2019-10-22 ENCOUNTER — Other Ambulatory Visit: Payer: Medicare Other | Admitting: Hospice

## 2019-10-22 ENCOUNTER — Other Ambulatory Visit: Payer: Self-pay

## 2019-10-22 DIAGNOSIS — Z515 Encounter for palliative care: Secondary | ICD-10-CM | POA: Diagnosis not present

## 2019-10-22 DIAGNOSIS — G8929 Other chronic pain: Secondary | ICD-10-CM

## 2019-10-22 NOTE — Progress Notes (Signed)
[facility] Consult Note Telephone: [phone]  Fax: [phone]  PATIENT NAME: [name] DOB: 1920-08-31 MRN: [id number]  PRIMARY CARE PROVIDER:   Wenda Low, MD  REFERRING PROVIDER:  Wenda Low, MD [address] [place],  [place] [address]  RESPONSIBLE PARTY:   [id number] Emergency contact - DaughterRosana [name]   RECOMMENDATIONS/PLAN:   [name] Planning/Goals of Care:  Patient is a DNR.Goals of care include to maximize quality of life and symptom management. Symptom management: Recent mechanical fall 09/18/2019 resulting in Closed displaced fracture of surgical neck of left humerus, treating conservatively with pain relief and arm sling. Sling immobilization to continue in the next one and half weeks, per Ortho. Patient said pain had improved and Tylenol effective when she hurst. She denied pain during visit. Patient currently on physical therapy; progressing well, well tolerated. She continues on her GERD, cough and allergy medications.She is mostly in bed but propels herself in her wheelchair around home sometimes; continent of bowel and bladder. Patient is able to communicate her needs; loves to work on puzzles. She is looking forward to taking off her sling and getting back to her former level of activity. Encouraged ongoing supportive care provided PT, by [name] (caregiver) and daughter who lives next door.  Follow up: Palliative care will continue to follow patient for goals of care clarification and symptom management. I spent 46  minutes providing this consultation.  More than 50% of the time in this consultation was spent on chart review and coordinating communication HISTORY OF PRESENT ILLNESS:  [name] is a 86 y.o. year old female with multiple medical problems including GERD chronic low back pain, HTN Hypothyroidism. Palliative Care was asked to help  address goals of care.   CODE STATUS: DNR  PPS: weak 40% HOSPICE ELIGIBILITY/DIAGNOSIS: TBD  PAST MEDICAL HISTORY:  Past Medical History:  Diagnosis Date  . Anemia   . Anginal pain ([place])   . Arthritis    "all over; doesn't seem to bother me much" (08/22/2013)  . Chronic lower back pain   . Dysrhythmia    "irregular heart beat"   . Exertional shortness of breath   . GERD (gastroesophageal reflux disease)   . Hypertension   . Hypothyroidism   . Migraines    "used to"  . Mixed hyperlipidemia   . Osteoporosis   . Paroxysmal supraventricular tachycardia ([place])   . Skin cancer    "couple taken off right leg" (08/22/2013)    SOCIAL HX:  Social History   Tobacco Use  . Smoking status: Never Smoker  . Smokeless tobacco: Never Used  Substance Use Topics  . Alcohol use: No    ALLERGIES:  Allergies  Allergen Reactions  . Codeine Other (See Comments)    "I feel like I'm floating"   . Gabapentin Other (See Comments)    Cannot be on for a good length of time- causes a lot of lethargy  . Other Other (See Comments)    All pain meds cause lethargy and disorientation     PERTINENT MEDICATIONS:  Outpatient Encounter Medications as of 10/22/2019  Medication Sig  . acetaminophen (TYLENOL) 500 MG tablet Take 2 tablets (1,000 mg total) by mouth 3 (three) times daily. X 3 DAYS (Patient taking differently: Take 1,000 mg by mouth at bedtime. )  . amLODipine (NORVASC) 5 MG tablet Take 1 tablet (5 mg total)  by mouth daily.  . benzonatate (TESSALON) 100 MG capsule Take 1 capsule (100 mg total) by mouth 3 (three) times daily as needed for cough.  . bimatoprost (LUMIGAN) 0.01 % SOLN Place 1 drop into both eyes at bedtime.  Marland Kitchen BIOTIN PO Take 1 tablet by mouth daily.  . bisacodyl (DULCOLAX) 10 MG suppository Place 1 suppository (10 mg total) rectally daily as needed for moderate constipation. (Patient not taking: Reported on 11/28/2018)  . brimonidine (ALPHAGAN) 0.15 % ophthalmic solution Place 1 drop  into the left eye 3 (three) times daily.  . Calcium Carb-Cholecalciferol (CALCIUM 600+D3 PO) Take 1 tablet by mouth 2 (two) times daily.   . clopidogrel (PLAVIX) 75 MG tablet Take 75 mg by mouth every other day.   . diltiazem (CARDIZEM CD) 120 MG 24 hr capsule Take 120 mg by mouth at bedtime.   . dorzolamide-timolol (COSOPT) 22.3-6.8 MG/ML ophthalmic solution Place 1 drop into the left eye 2 (two) times daily.  . feeding supplement, ENSURE ENLIVE, (ENSURE ENLIVE) LIQD Take 237 mLs by mouth 2 (two) times daily between meals.  . ferrous sulfate 325 (65 FE) MG EC tablet Take 325 mg by mouth every evening.  . fexofenadine (ALLEGRA) 180 MG tablet Take 180 mg by mouth daily.  Marland Kitchen gemfibrozil (LOPID) 600 MG tablet Take 600 mg by mouth 2 (two) times daily before a meal.  . hydrALAZINE (APRESOLINE) 25 MG tablet Take 2 tablets (50 mg total) by mouth 2 (two) times a day.  . levothyroxine (SYNTHROID, LEVOTHROID) 50 MCG tablet Take 25-50 mcg by mouth See admin instructions. Take 25 mcg by mouth in the morning before breakfast, alternating with 50 mcg (every other morning)  . loperamide (IMODIUM) 2 MG capsule Take 2-4 mg by mouth as needed for diarrhea or loose stools.   . metoprolol (LOPRESSOR) 50 MG tablet Take 50 mg by mouth 2 (two) times daily.  . nitroGLYCERIN (NITROSTAT) 0.4 MG SL tablet Place 1 tablet (0.4 mg total) under the tongue every 5 (five) minutes as needed for chest pain.  . Omega-3 Fatty Acids (FISH OIL) 1000 MG CAPS Take 1,000 mg by mouth 2 (two) times daily.   . pantoprazole (PROTONIX) 40 MG tablet Take 1 tablet (40 mg total) by mouth 2 (two) times daily before a meal. Take 2 times daily for 2 weeks, then 1 tablet daily/  . sertraline (ZOLOFT) 100 MG tablet Take 100 mg by mouth at bedtime.    No facility-administered encounter medications on file as of 10/22/2019.    PHYSICAL EXAM:   General: NAD. Cardiovascular: regular rate and rhythm Pulmonary: clear ant/post fields Abdomen: soft,  nontender, + bowel sounds GU: no suprapubic tenderness Extremities: no edema, sling to left shoulder Skin: no rashes to exposed skin Neurological: Weakness but otherwise nonfocal  Teodoro Spray, NP

## 2019-10-23 DIAGNOSIS — G43909 Migraine, unspecified, not intractable, without status migrainosus: Secondary | ICD-10-CM | POA: Diagnosis not present

## 2019-10-23 DIAGNOSIS — I10 Essential (primary) hypertension: Secondary | ICD-10-CM | POA: Diagnosis not present

## 2019-10-23 DIAGNOSIS — H409 Unspecified glaucoma: Secondary | ICD-10-CM | POA: Diagnosis not present

## 2019-10-23 DIAGNOSIS — E039 Hypothyroidism, unspecified: Secondary | ICD-10-CM | POA: Diagnosis not present

## 2019-10-23 DIAGNOSIS — I739 Peripheral vascular disease, unspecified: Secondary | ICD-10-CM | POA: Diagnosis not present

## 2019-10-23 DIAGNOSIS — M80022D Age-related osteoporosis with current pathological fracture, left humerus, subsequent encounter for fracture with routine healing: Secondary | ICD-10-CM | POA: Diagnosis not present

## 2019-10-27 DIAGNOSIS — I739 Peripheral vascular disease, unspecified: Secondary | ICD-10-CM | POA: Diagnosis not present

## 2019-10-27 DIAGNOSIS — E039 Hypothyroidism, unspecified: Secondary | ICD-10-CM | POA: Diagnosis not present

## 2019-10-27 DIAGNOSIS — I10 Essential (primary) hypertension: Secondary | ICD-10-CM | POA: Diagnosis not present

## 2019-10-27 DIAGNOSIS — M80022D Age-related osteoporosis with current pathological fracture, left humerus, subsequent encounter for fracture with routine healing: Secondary | ICD-10-CM | POA: Diagnosis not present

## 2019-10-27 DIAGNOSIS — G43909 Migraine, unspecified, not intractable, without status migrainosus: Secondary | ICD-10-CM | POA: Diagnosis not present

## 2019-10-27 DIAGNOSIS — H409 Unspecified glaucoma: Secondary | ICD-10-CM | POA: Diagnosis not present

## 2019-10-28 ENCOUNTER — Telehealth: Payer: Self-pay

## 2019-10-28 NOTE — Telephone Encounter (Signed)
Received message to call patient's daughter, [name]. Returned call. Spoke with Caregiver, [name], who reported that BP was elevated this morning @ 7:30 am (190/77). Daughter instructed [name] to give prescribed BP medications. Recheck of BP @ 9am was 163/80 and again at 12:30 pm was 168/75. Patient denies any headaches, dizziness, numbness or chest pain. Patient is up out of bed with no complaints and eating well. [name] will continue to monitor patient and call back if any changes/concerns. Scheduled a phone check in for Thursday.

## 2019-10-29 ENCOUNTER — Telehealth: Payer: Self-pay

## 2019-10-29 DIAGNOSIS — H409 Unspecified glaucoma: Secondary | ICD-10-CM | POA: Diagnosis not present

## 2019-10-29 DIAGNOSIS — E039 Hypothyroidism, unspecified: Secondary | ICD-10-CM | POA: Diagnosis not present

## 2019-10-29 DIAGNOSIS — I10 Essential (primary) hypertension: Secondary | ICD-10-CM | POA: Diagnosis not present

## 2019-10-29 DIAGNOSIS — I739 Peripheral vascular disease, unspecified: Secondary | ICD-10-CM | POA: Diagnosis not present

## 2019-10-29 DIAGNOSIS — G43909 Migraine, unspecified, not intractable, without status migrainosus: Secondary | ICD-10-CM | POA: Diagnosis not present

## 2019-10-29 DIAGNOSIS — M80022D Age-related osteoporosis with current pathological fracture, left humerus, subsequent encounter for fracture with routine healing: Secondary | ICD-10-CM | POA: Diagnosis not present

## 2019-10-29 NOTE — Telephone Encounter (Signed)
Telephone call to schedule palliative care visit. Caregiver [name] in agreement with palliative care home visit 11/03/19 at 11:00 AM.

## 2019-10-30 DIAGNOSIS — M80022D Age-related osteoporosis with current pathological fracture, left humerus, subsequent encounter for fracture with routine healing: Secondary | ICD-10-CM | POA: Diagnosis not present

## 2019-10-30 DIAGNOSIS — E039 Hypothyroidism, unspecified: Secondary | ICD-10-CM | POA: Diagnosis not present

## 2019-10-30 DIAGNOSIS — I739 Peripheral vascular disease, unspecified: Secondary | ICD-10-CM | POA: Diagnosis not present

## 2019-10-30 DIAGNOSIS — R410 Disorientation, unspecified: Secondary | ICD-10-CM | POA: Diagnosis not present

## 2019-10-30 DIAGNOSIS — I10 Essential (primary) hypertension: Secondary | ICD-10-CM | POA: Diagnosis not present

## 2019-10-30 DIAGNOSIS — G43909 Migraine, unspecified, not intractable, without status migrainosus: Secondary | ICD-10-CM | POA: Diagnosis not present

## 2019-10-30 DIAGNOSIS — H409 Unspecified glaucoma: Secondary | ICD-10-CM | POA: Diagnosis not present

## 2019-10-30 DIAGNOSIS — N1831 Chronic kidney disease, stage 3a: Secondary | ICD-10-CM | POA: Diagnosis not present

## 2019-10-31 DIAGNOSIS — I10 Essential (primary) hypertension: Secondary | ICD-10-CM | POA: Diagnosis not present

## 2019-10-31 DIAGNOSIS — M80022D Age-related osteoporosis with current pathological fracture, left humerus, subsequent encounter for fracture with routine healing: Secondary | ICD-10-CM | POA: Diagnosis not present

## 2019-10-31 DIAGNOSIS — I739 Peripheral vascular disease, unspecified: Secondary | ICD-10-CM | POA: Diagnosis not present

## 2019-10-31 DIAGNOSIS — H409 Unspecified glaucoma: Secondary | ICD-10-CM | POA: Diagnosis not present

## 2019-10-31 DIAGNOSIS — E039 Hypothyroidism, unspecified: Secondary | ICD-10-CM | POA: Diagnosis not present

## 2019-10-31 DIAGNOSIS — G43909 Migraine, unspecified, not intractable, without status migrainosus: Secondary | ICD-10-CM | POA: Diagnosis not present

## 2019-11-03 ENCOUNTER — Other Ambulatory Visit: Payer: Medicare Other

## 2019-11-03 ENCOUNTER — Other Ambulatory Visit: Payer: Self-pay

## 2019-11-03 DIAGNOSIS — E039 Hypothyroidism, unspecified: Secondary | ICD-10-CM | POA: Diagnosis not present

## 2019-11-03 DIAGNOSIS — Z515 Encounter for palliative care: Secondary | ICD-10-CM

## 2019-11-03 DIAGNOSIS — G43909 Migraine, unspecified, not intractable, without status migrainosus: Secondary | ICD-10-CM | POA: Diagnosis not present

## 2019-11-03 DIAGNOSIS — I10 Essential (primary) hypertension: Secondary | ICD-10-CM | POA: Diagnosis not present

## 2019-11-03 DIAGNOSIS — I739 Peripheral vascular disease, unspecified: Secondary | ICD-10-CM | POA: Diagnosis not present

## 2019-11-03 DIAGNOSIS — H409 Unspecified glaucoma: Secondary | ICD-10-CM | POA: Diagnosis not present

## 2019-11-03 DIAGNOSIS — M80022D Age-related osteoporosis with current pathological fracture, left humerus, subsequent encounter for fracture with routine healing: Secondary | ICD-10-CM | POA: Diagnosis not present

## 2019-11-03 NOTE — Progress Notes (Signed)
COMMUNITY PALLIATIVE CARE SW NOTE  PATIENT NAME: [name] DOB: 08/19/1921 MRN: [id number]  PRIMARY CARE PROVIDER: Wenda Low, MD  RESPONSIBLE PARTY:  Acct ID - Guarantor Home Phone Work Phone Relationship Acct Type  [id number] [name]  Self P/F     [place], [name], [place] [address]     PLAN OF CARE and INTERVENTIONS:             1. GOALS OF CARE/ ADVANCE CARE PLANNING:  Patient is DNR.HCPOA is daughter, [name].Goal is for patient to remain at home, and to continue with therapy. Patient said she wants to be able to use her walker to ambulate again. 2. SOCIAL/EMOTIONAL/SPIRITUAL ASSESSMENT/ INTERVENTIONS:  SW and RN met with patient [name], paid caregiver in the home. Patient and [name] reviewed recent fall and ED visit in January. Patient was found to have fractured her left humerus. Patient was wearing a sling up until yesterday. Patient said she still cannot put pressure on that arm, and has limited movement of that arm. Patient is weaker, using her wheelchair more often now. Patient reports pain in her neck, rates a 6 out of 10. Patient takes tylenol for pain. Patient said she has a good appetite, maintaining her weight. Patient reports she is sleeping well and naps often during the day. SW provided supportive counseling, discussed goals of care, and used active and reflective listening.  3. PATIENT/CAREGIVER EDUCATION/ COPING:  Patient was alert, oriented. Patient is forgetful at times. Patient is pleasant during conversation, appeared tired.Patient said she is motivated to get stronger. Patient has supportive family. 4. PERSONAL EMERGENCY PLAN:  Caregiver/family will call 9-1-1 for emergencies. 5. COMMUNITY RESOURCES COORDINATION/ HEALTH CARE NAVIGATION:  Patient is receiving PT/OT with [facility]. Patient did receive both COVID vaccines, no concerns. Patient now has 24-hour care. [name], [name]'s daughter and patient's daughter rotate in the home.  6. FINANCIAL/LEGAL  CONCERNS/INTERVENTIONS:  None.     SOCIAL HX:  Social History   Tobacco Use  . Smoking status: Never Smoker  . Smokeless tobacco: Never Used  Substance Use Topics  . Alcohol use: No    CODE STATUS:   Code Status: Prior (DNR) ADVANCED DIRECTIVES: Y MOST FORM COMPLETE: No. HOSPICE EDUCATION PROVIDED: None.  PPS: Patient needs assistance with bathing, dressing, and meal prep. Patientis able to feed herself.Patient is doing transfers only at this time, unable to use her walker due to her arm fracture.   I spent12mnutes with patient/family, from11:00a-12:00pproviding education, support and consultation.  WMargaretmary Lombard LCSW

## 2019-11-03 NOTE — Progress Notes (Signed)
PATIENT NAME: [name] DOB: 09-05-1920 MRN: [id number]  PRIMARY CARE PROVIDER: Wenda Low, MD  RESPONSIBLE PARTY:  Acct ID - Guarantor Home Phone Work Phone Relationship Acct Type  [id number] Marissa [phone]  Self P/F     [name], [name], [name] [address]    PLAN OF CARE and INTERVENTIONS:               1.  GOALS OF CARE/ ADVANCE CARE PLANNING:  Remain in her home with caregivers and family providing care.               2.  PATIENT/CAREGIVER EDUCATION:  Education on fall precautions, education on s/s of infection, review meds, support               4. PERSONAL EMERGENCY PLAN:  Patient has life line.               5.  DISEASE STATUS:SW and RN made scheduled palliative care home visit. Palliative care team met with patient and hired caregiver [name]. Patient suffered a fall one 08-23-19 and fractured her left humerus. Patient's left arm was in a sling until last week. Patient receiving PT and OT Services through [facility]. Patient reports having pain in left side of neck and currently rates pain at 7 on pain scale. [name] reports they have been giving extra strength Tylenol ES (2 tabs) of 500 mg 3 times today to patient for pain. Patient reports she has difficulty raising her arm. Education to patient hopefully this will improve with therapy. Patient reports for appetite is good. Patients current weight is 104 lbs. Patient's blood pressure has been elevated and was patient was given a prescription for clonidine 0.1 mg 1 tablet to be taken for blood pressure greater than 180. Patient is weak and needs assist when going from sitting to standing. Patient unable to use her walker due to fracture. [name] reports they are using wheelchair all the time for patient. Patient much weaker than when palliative care team visited patient in November. Patient has received both of her covid-19 vaccinations. Patients B/P currently 158/55.  Patient's breath sounds are clear and patient has a non  productive cough. [name] reports she feels cough is due to patient having reflux. Patient has no edema in her lower extremities. Patient has been sleeping well at night and naps frequently during the day. Nurse reviewed patient's medications with [name]. Patient appreciative of palliative care visit. Patient and caregiver encouraged to contact palliative care with questions or concerns.     HISTORY OF PRESENT ILLNESS: Patient is a 86 year old female who lives in her own home.  Patient is not left alone since suffering a fall in January.      CODE STATUS: DNR  ADVANCED DIRECTIVES: Y MOST FORM: No PPS: 40%   PHYSICAL EXAM:   VITALS:see vital signs LUNGS: clear to auscultation  CARDIAC: Cor Brady  EXTREMITIES: Trace edema SKIN: Skin color, texture, turgor normal. No rashes or lesions  NEURO: positive for gait problems, memory problems and weakness       Marissa Simmer, RN

## 2019-11-04 DIAGNOSIS — M80022D Age-related osteoporosis with current pathological fracture, left humerus, subsequent encounter for fracture with routine healing: Secondary | ICD-10-CM | POA: Diagnosis not present

## 2019-11-04 DIAGNOSIS — G43909 Migraine, unspecified, not intractable, without status migrainosus: Secondary | ICD-10-CM | POA: Diagnosis not present

## 2019-11-04 DIAGNOSIS — E039 Hypothyroidism, unspecified: Secondary | ICD-10-CM | POA: Diagnosis not present

## 2019-11-04 DIAGNOSIS — I739 Peripheral vascular disease, unspecified: Secondary | ICD-10-CM | POA: Diagnosis not present

## 2019-11-04 DIAGNOSIS — H409 Unspecified glaucoma: Secondary | ICD-10-CM | POA: Diagnosis not present

## 2019-11-04 DIAGNOSIS — I10 Essential (primary) hypertension: Secondary | ICD-10-CM | POA: Diagnosis not present

## 2019-11-05 DIAGNOSIS — E039 Hypothyroidism, unspecified: Secondary | ICD-10-CM | POA: Diagnosis not present

## 2019-11-05 DIAGNOSIS — N1831 Chronic kidney disease, stage 3a: Secondary | ICD-10-CM | POA: Diagnosis not present

## 2019-11-05 DIAGNOSIS — M1990 Unspecified osteoarthritis, unspecified site: Secondary | ICD-10-CM | POA: Diagnosis not present

## 2019-11-05 DIAGNOSIS — G459 Transient cerebral ischemic attack, unspecified: Secondary | ICD-10-CM | POA: Diagnosis not present

## 2019-11-05 DIAGNOSIS — I10 Essential (primary) hypertension: Secondary | ICD-10-CM | POA: Diagnosis not present

## 2019-11-05 DIAGNOSIS — M810 Age-related osteoporosis without current pathological fracture: Secondary | ICD-10-CM | POA: Diagnosis not present

## 2019-11-05 DIAGNOSIS — D649 Anemia, unspecified: Secondary | ICD-10-CM | POA: Diagnosis not present

## 2019-11-05 DIAGNOSIS — E782 Mixed hyperlipidemia: Secondary | ICD-10-CM | POA: Diagnosis not present

## 2019-11-05 DIAGNOSIS — F324 Major depressive disorder, single episode, in partial remission: Secondary | ICD-10-CM | POA: Diagnosis not present

## 2019-11-05 DIAGNOSIS — N183 Chronic kidney disease, stage 3 unspecified: Secondary | ICD-10-CM | POA: Diagnosis not present

## 2019-11-05 DIAGNOSIS — H409 Unspecified glaucoma: Secondary | ICD-10-CM | POA: Diagnosis not present

## 2019-11-05 DIAGNOSIS — G450 Vertebro-basilar artery syndrome: Secondary | ICD-10-CM | POA: Diagnosis not present

## 2019-11-06 DIAGNOSIS — G43909 Migraine, unspecified, not intractable, without status migrainosus: Secondary | ICD-10-CM | POA: Diagnosis not present

## 2019-11-06 DIAGNOSIS — I739 Peripheral vascular disease, unspecified: Secondary | ICD-10-CM | POA: Diagnosis not present

## 2019-11-06 DIAGNOSIS — M80022D Age-related osteoporosis with current pathological fracture, left humerus, subsequent encounter for fracture with routine healing: Secondary | ICD-10-CM | POA: Diagnosis not present

## 2019-11-06 DIAGNOSIS — I10 Essential (primary) hypertension: Secondary | ICD-10-CM | POA: Diagnosis not present

## 2019-11-06 DIAGNOSIS — E039 Hypothyroidism, unspecified: Secondary | ICD-10-CM | POA: Diagnosis not present

## 2019-11-06 DIAGNOSIS — H409 Unspecified glaucoma: Secondary | ICD-10-CM | POA: Diagnosis not present

## 2019-11-07 DIAGNOSIS — I10 Essential (primary) hypertension: Secondary | ICD-10-CM | POA: Diagnosis not present

## 2019-11-07 DIAGNOSIS — G43909 Migraine, unspecified, not intractable, without status migrainosus: Secondary | ICD-10-CM | POA: Diagnosis not present

## 2019-11-07 DIAGNOSIS — E039 Hypothyroidism, unspecified: Secondary | ICD-10-CM | POA: Diagnosis not present

## 2019-11-07 DIAGNOSIS — M80022D Age-related osteoporosis with current pathological fracture, left humerus, subsequent encounter for fracture with routine healing: Secondary | ICD-10-CM | POA: Diagnosis not present

## 2019-11-07 DIAGNOSIS — I739 Peripheral vascular disease, unspecified: Secondary | ICD-10-CM | POA: Diagnosis not present

## 2019-11-07 DIAGNOSIS — H409 Unspecified glaucoma: Secondary | ICD-10-CM | POA: Diagnosis not present

## 2019-11-10 DIAGNOSIS — M1990 Unspecified osteoarthritis, unspecified site: Secondary | ICD-10-CM | POA: Diagnosis not present

## 2019-11-10 DIAGNOSIS — I10 Essential (primary) hypertension: Secondary | ICD-10-CM | POA: Diagnosis not present

## 2019-11-10 DIAGNOSIS — K219 Gastro-esophageal reflux disease without esophagitis: Secondary | ICD-10-CM | POA: Diagnosis not present

## 2019-11-10 DIAGNOSIS — I739 Peripheral vascular disease, unspecified: Secondary | ICD-10-CM | POA: Diagnosis not present

## 2019-11-10 DIAGNOSIS — E039 Hypothyroidism, unspecified: Secondary | ICD-10-CM | POA: Diagnosis not present

## 2019-11-10 DIAGNOSIS — D649 Anemia, unspecified: Secondary | ICD-10-CM | POA: Diagnosis not present

## 2019-11-10 DIAGNOSIS — H409 Unspecified glaucoma: Secondary | ICD-10-CM | POA: Diagnosis not present

## 2019-11-10 DIAGNOSIS — Z9181 History of falling: Secondary | ICD-10-CM | POA: Diagnosis not present

## 2019-11-10 DIAGNOSIS — M80022D Age-related osteoporosis with current pathological fracture, left humerus, subsequent encounter for fracture with routine healing: Secondary | ICD-10-CM | POA: Diagnosis not present

## 2019-11-10 DIAGNOSIS — G43909 Migraine, unspecified, not intractable, without status migrainosus: Secondary | ICD-10-CM | POA: Diagnosis not present

## 2019-11-10 DIAGNOSIS — E782 Mixed hyperlipidemia: Secondary | ICD-10-CM | POA: Diagnosis not present

## 2019-11-10 DIAGNOSIS — Z85828 Personal history of other malignant neoplasm of skin: Secondary | ICD-10-CM | POA: Diagnosis not present

## 2019-11-11 DIAGNOSIS — I10 Essential (primary) hypertension: Secondary | ICD-10-CM | POA: Diagnosis not present

## 2019-11-11 DIAGNOSIS — G43909 Migraine, unspecified, not intractable, without status migrainosus: Secondary | ICD-10-CM | POA: Diagnosis not present

## 2019-11-11 DIAGNOSIS — I739 Peripheral vascular disease, unspecified: Secondary | ICD-10-CM | POA: Diagnosis not present

## 2019-11-11 DIAGNOSIS — M80022D Age-related osteoporosis with current pathological fracture, left humerus, subsequent encounter for fracture with routine healing: Secondary | ICD-10-CM | POA: Diagnosis not present

## 2019-11-11 DIAGNOSIS — E039 Hypothyroidism, unspecified: Secondary | ICD-10-CM | POA: Diagnosis not present

## 2019-11-11 DIAGNOSIS — H409 Unspecified glaucoma: Secondary | ICD-10-CM | POA: Diagnosis not present

## 2019-11-12 DIAGNOSIS — H409 Unspecified glaucoma: Secondary | ICD-10-CM | POA: Diagnosis not present

## 2019-11-12 DIAGNOSIS — I739 Peripheral vascular disease, unspecified: Secondary | ICD-10-CM | POA: Diagnosis not present

## 2019-11-12 DIAGNOSIS — E039 Hypothyroidism, unspecified: Secondary | ICD-10-CM | POA: Diagnosis not present

## 2019-11-12 DIAGNOSIS — M80022D Age-related osteoporosis with current pathological fracture, left humerus, subsequent encounter for fracture with routine healing: Secondary | ICD-10-CM | POA: Diagnosis not present

## 2019-11-12 DIAGNOSIS — I10 Essential (primary) hypertension: Secondary | ICD-10-CM | POA: Diagnosis not present

## 2019-11-12 DIAGNOSIS — G43909 Migraine, unspecified, not intractable, without status migrainosus: Secondary | ICD-10-CM | POA: Diagnosis not present

## 2019-11-13 DIAGNOSIS — G43909 Migraine, unspecified, not intractable, without status migrainosus: Secondary | ICD-10-CM | POA: Diagnosis not present

## 2019-11-13 DIAGNOSIS — M80022D Age-related osteoporosis with current pathological fracture, left humerus, subsequent encounter for fracture with routine healing: Secondary | ICD-10-CM | POA: Diagnosis not present

## 2019-11-13 DIAGNOSIS — E039 Hypothyroidism, unspecified: Secondary | ICD-10-CM | POA: Diagnosis not present

## 2019-11-13 DIAGNOSIS — I10 Essential (primary) hypertension: Secondary | ICD-10-CM | POA: Diagnosis not present

## 2019-11-13 DIAGNOSIS — H409 Unspecified glaucoma: Secondary | ICD-10-CM | POA: Diagnosis not present

## 2019-11-13 DIAGNOSIS — I739 Peripheral vascular disease, unspecified: Secondary | ICD-10-CM | POA: Diagnosis not present

## 2019-11-14 DIAGNOSIS — G43909 Migraine, unspecified, not intractable, without status migrainosus: Secondary | ICD-10-CM | POA: Diagnosis not present

## 2019-11-14 DIAGNOSIS — H409 Unspecified glaucoma: Secondary | ICD-10-CM | POA: Diagnosis not present

## 2019-11-14 DIAGNOSIS — E039 Hypothyroidism, unspecified: Secondary | ICD-10-CM | POA: Diagnosis not present

## 2019-11-14 DIAGNOSIS — I10 Essential (primary) hypertension: Secondary | ICD-10-CM | POA: Diagnosis not present

## 2019-11-14 DIAGNOSIS — I739 Peripheral vascular disease, unspecified: Secondary | ICD-10-CM | POA: Diagnosis not present

## 2019-11-14 DIAGNOSIS — M80022D Age-related osteoporosis with current pathological fracture, left humerus, subsequent encounter for fracture with routine healing: Secondary | ICD-10-CM | POA: Diagnosis not present

## 2019-11-17 DIAGNOSIS — G43909 Migraine, unspecified, not intractable, without status migrainosus: Secondary | ICD-10-CM | POA: Diagnosis not present

## 2019-11-17 DIAGNOSIS — I739 Peripheral vascular disease, unspecified: Secondary | ICD-10-CM | POA: Diagnosis not present

## 2019-11-17 DIAGNOSIS — M80022D Age-related osteoporosis with current pathological fracture, left humerus, subsequent encounter for fracture with routine healing: Secondary | ICD-10-CM | POA: Diagnosis not present

## 2019-11-17 DIAGNOSIS — E039 Hypothyroidism, unspecified: Secondary | ICD-10-CM | POA: Diagnosis not present

## 2019-11-17 DIAGNOSIS — I10 Essential (primary) hypertension: Secondary | ICD-10-CM | POA: Diagnosis not present

## 2019-11-17 DIAGNOSIS — H409 Unspecified glaucoma: Secondary | ICD-10-CM | POA: Diagnosis not present

## 2019-11-20 DIAGNOSIS — R41841 Cognitive communication deficit: Secondary | ICD-10-CM | POA: Diagnosis not present

## 2019-11-20 DIAGNOSIS — M25512 Pain in left shoulder: Secondary | ICD-10-CM | POA: Diagnosis not present

## 2019-11-20 DIAGNOSIS — E039 Hypothyroidism, unspecified: Secondary | ICD-10-CM | POA: Diagnosis not present

## 2019-11-20 DIAGNOSIS — R4189 Other symptoms and signs involving cognitive functions and awareness: Secondary | ICD-10-CM | POA: Diagnosis not present

## 2019-11-20 DIAGNOSIS — M545 Low back pain: Secondary | ICD-10-CM | POA: Diagnosis not present

## 2019-11-20 DIAGNOSIS — R159 Full incontinence of feces: Secondary | ICD-10-CM | POA: Diagnosis not present

## 2019-11-20 DIAGNOSIS — R69 Illness, unspecified: Secondary | ICD-10-CM | POA: Diagnosis not present

## 2019-11-20 DIAGNOSIS — M6281 Muscle weakness (generalized): Secondary | ICD-10-CM | POA: Diagnosis not present

## 2019-11-20 DIAGNOSIS — R1312 Dysphagia, oropharyngeal phase: Secondary | ICD-10-CM | POA: Diagnosis not present

## 2019-11-20 DIAGNOSIS — S42202D Unspecified fracture of upper end of left humerus, subsequent encounter for fracture with routine healing: Secondary | ICD-10-CM | POA: Diagnosis not present

## 2019-11-20 DIAGNOSIS — R2681 Unsteadiness on feet: Secondary | ICD-10-CM | POA: Diagnosis not present

## 2019-11-20 DIAGNOSIS — I10 Essential (primary) hypertension: Secondary | ICD-10-CM | POA: Diagnosis not present

## 2019-11-20 DIAGNOSIS — Z9181 History of falling: Secondary | ICD-10-CM | POA: Diagnosis not present

## 2019-11-20 DIAGNOSIS — M84422S Pathological fracture, left humerus, sequela: Secondary | ICD-10-CM | POA: Diagnosis not present

## 2019-11-20 DIAGNOSIS — I471 Supraventricular tachycardia: Secondary | ICD-10-CM | POA: Diagnosis not present

## 2019-11-21 DIAGNOSIS — M6281 Muscle weakness (generalized): Secondary | ICD-10-CM | POA: Diagnosis not present

## 2019-11-21 DIAGNOSIS — I119 Hypertensive heart disease without heart failure: Secondary | ICD-10-CM | POA: Diagnosis not present

## 2019-11-21 DIAGNOSIS — M79622 Pain in left upper arm: Secondary | ICD-10-CM | POA: Diagnosis not present

## 2019-11-21 DIAGNOSIS — R5381 Other malaise: Secondary | ICD-10-CM | POA: Diagnosis not present

## 2019-11-21 DIAGNOSIS — M84422S Pathological fracture, left humerus, sequela: Secondary | ICD-10-CM | POA: Diagnosis not present

## 2019-11-21 DIAGNOSIS — R1312 Dysphagia, oropharyngeal phase: Secondary | ICD-10-CM | POA: Diagnosis not present

## 2019-11-21 DIAGNOSIS — R41841 Cognitive communication deficit: Secondary | ICD-10-CM | POA: Diagnosis not present

## 2019-11-21 DIAGNOSIS — R2681 Unsteadiness on feet: Secondary | ICD-10-CM | POA: Diagnosis not present

## 2019-11-21 DIAGNOSIS — Z8781 Personal history of (healed) traumatic fracture: Secondary | ICD-10-CM | POA: Diagnosis not present

## 2019-11-21 DIAGNOSIS — S42202D Unspecified fracture of upper end of left humerus, subsequent encounter for fracture with routine healing: Secondary | ICD-10-CM | POA: Diagnosis not present

## 2019-11-21 DIAGNOSIS — I10 Essential (primary) hypertension: Secondary | ICD-10-CM | POA: Diagnosis not present

## 2019-11-21 DIAGNOSIS — R197 Diarrhea, unspecified: Secondary | ICD-10-CM | POA: Diagnosis not present

## 2019-11-23 DIAGNOSIS — M6281 Muscle weakness (generalized): Secondary | ICD-10-CM | POA: Diagnosis not present

## 2019-11-23 DIAGNOSIS — I10 Essential (primary) hypertension: Secondary | ICD-10-CM | POA: Diagnosis not present

## 2019-11-23 DIAGNOSIS — R1312 Dysphagia, oropharyngeal phase: Secondary | ICD-10-CM | POA: Diagnosis not present

## 2019-11-23 DIAGNOSIS — R41841 Cognitive communication deficit: Secondary | ICD-10-CM | POA: Diagnosis not present

## 2019-11-23 DIAGNOSIS — M84422S Pathological fracture, left humerus, sequela: Secondary | ICD-10-CM | POA: Diagnosis not present

## 2019-11-23 DIAGNOSIS — R2681 Unsteadiness on feet: Secondary | ICD-10-CM | POA: Diagnosis not present

## 2019-11-24 DIAGNOSIS — I10 Essential (primary) hypertension: Secondary | ICD-10-CM | POA: Diagnosis not present

## 2019-11-24 DIAGNOSIS — M84422S Pathological fracture, left humerus, sequela: Secondary | ICD-10-CM | POA: Diagnosis not present

## 2019-11-24 DIAGNOSIS — R2681 Unsteadiness on feet: Secondary | ICD-10-CM | POA: Diagnosis not present

## 2019-11-24 DIAGNOSIS — R1312 Dysphagia, oropharyngeal phase: Secondary | ICD-10-CM | POA: Diagnosis not present

## 2019-11-24 DIAGNOSIS — R41841 Cognitive communication deficit: Secondary | ICD-10-CM | POA: Diagnosis not present

## 2019-11-24 DIAGNOSIS — M6281 Muscle weakness (generalized): Secondary | ICD-10-CM | POA: Diagnosis not present

## 2019-11-25 DIAGNOSIS — R41841 Cognitive communication deficit: Secondary | ICD-10-CM | POA: Diagnosis not present

## 2019-11-25 DIAGNOSIS — M6281 Muscle weakness (generalized): Secondary | ICD-10-CM | POA: Diagnosis not present

## 2019-11-25 DIAGNOSIS — M84422S Pathological fracture, left humerus, sequela: Secondary | ICD-10-CM | POA: Diagnosis not present

## 2019-11-25 DIAGNOSIS — R1312 Dysphagia, oropharyngeal phase: Secondary | ICD-10-CM | POA: Diagnosis not present

## 2019-11-25 DIAGNOSIS — R2681 Unsteadiness on feet: Secondary | ICD-10-CM | POA: Diagnosis not present

## 2019-11-25 DIAGNOSIS — I10 Essential (primary) hypertension: Secondary | ICD-10-CM | POA: Diagnosis not present

## 2019-11-26 DIAGNOSIS — R197 Diarrhea, unspecified: Secondary | ICD-10-CM | POA: Diagnosis not present

## 2019-11-26 DIAGNOSIS — I119 Hypertensive heart disease without heart failure: Secondary | ICD-10-CM | POA: Diagnosis not present

## 2019-11-27 DIAGNOSIS — R41841 Cognitive communication deficit: Secondary | ICD-10-CM | POA: Diagnosis not present

## 2019-11-27 DIAGNOSIS — R1312 Dysphagia, oropharyngeal phase: Secondary | ICD-10-CM | POA: Diagnosis not present

## 2019-11-27 DIAGNOSIS — R2681 Unsteadiness on feet: Secondary | ICD-10-CM | POA: Diagnosis not present

## 2019-11-27 DIAGNOSIS — M84422S Pathological fracture, left humerus, sequela: Secondary | ICD-10-CM | POA: Diagnosis not present

## 2019-11-27 DIAGNOSIS — M6281 Muscle weakness (generalized): Secondary | ICD-10-CM | POA: Diagnosis not present

## 2019-11-27 DIAGNOSIS — I10 Essential (primary) hypertension: Secondary | ICD-10-CM | POA: Diagnosis not present

## 2019-11-28 DIAGNOSIS — R41841 Cognitive communication deficit: Secondary | ICD-10-CM | POA: Diagnosis not present

## 2019-11-28 DIAGNOSIS — R1312 Dysphagia, oropharyngeal phase: Secondary | ICD-10-CM | POA: Diagnosis not present

## 2019-11-28 DIAGNOSIS — I10 Essential (primary) hypertension: Secondary | ICD-10-CM | POA: Diagnosis not present

## 2019-11-28 DIAGNOSIS — M84422S Pathological fracture, left humerus, sequela: Secondary | ICD-10-CM | POA: Diagnosis not present

## 2019-11-28 DIAGNOSIS — R2681 Unsteadiness on feet: Secondary | ICD-10-CM | POA: Diagnosis not present

## 2019-11-28 DIAGNOSIS — M6281 Muscle weakness (generalized): Secondary | ICD-10-CM | POA: Diagnosis not present

## 2019-11-30 DIAGNOSIS — R2681 Unsteadiness on feet: Secondary | ICD-10-CM | POA: Diagnosis not present

## 2019-11-30 DIAGNOSIS — M84422S Pathological fracture, left humerus, sequela: Secondary | ICD-10-CM | POA: Diagnosis not present

## 2019-11-30 DIAGNOSIS — M6281 Muscle weakness (generalized): Secondary | ICD-10-CM | POA: Diagnosis not present

## 2019-11-30 DIAGNOSIS — I10 Essential (primary) hypertension: Secondary | ICD-10-CM | POA: Diagnosis not present

## 2019-11-30 DIAGNOSIS — R41841 Cognitive communication deficit: Secondary | ICD-10-CM | POA: Diagnosis not present

## 2019-11-30 DIAGNOSIS — R1312 Dysphagia, oropharyngeal phase: Secondary | ICD-10-CM | POA: Diagnosis not present

## 2019-12-01 DIAGNOSIS — I10 Essential (primary) hypertension: Secondary | ICD-10-CM | POA: Diagnosis not present

## 2019-12-01 DIAGNOSIS — M6281 Muscle weakness (generalized): Secondary | ICD-10-CM | POA: Diagnosis not present

## 2019-12-01 DIAGNOSIS — R1312 Dysphagia, oropharyngeal phase: Secondary | ICD-10-CM | POA: Diagnosis not present

## 2019-12-01 DIAGNOSIS — R2681 Unsteadiness on feet: Secondary | ICD-10-CM | POA: Diagnosis not present

## 2019-12-01 DIAGNOSIS — R41841 Cognitive communication deficit: Secondary | ICD-10-CM | POA: Diagnosis not present

## 2019-12-01 DIAGNOSIS — R197 Diarrhea, unspecified: Secondary | ICD-10-CM | POA: Diagnosis not present

## 2019-12-01 DIAGNOSIS — M84422S Pathological fracture, left humerus, sequela: Secondary | ICD-10-CM | POA: Diagnosis not present

## 2019-12-02 DIAGNOSIS — R41841 Cognitive communication deficit: Secondary | ICD-10-CM | POA: Diagnosis not present

## 2019-12-02 DIAGNOSIS — R2681 Unsteadiness on feet: Secondary | ICD-10-CM | POA: Diagnosis not present

## 2019-12-02 DIAGNOSIS — M6281 Muscle weakness (generalized): Secondary | ICD-10-CM | POA: Diagnosis not present

## 2019-12-02 DIAGNOSIS — M84422S Pathological fracture, left humerus, sequela: Secondary | ICD-10-CM | POA: Diagnosis not present

## 2019-12-02 DIAGNOSIS — I10 Essential (primary) hypertension: Secondary | ICD-10-CM | POA: Diagnosis not present

## 2019-12-02 DIAGNOSIS — R1312 Dysphagia, oropharyngeal phase: Secondary | ICD-10-CM | POA: Diagnosis not present

## 2019-12-02 DIAGNOSIS — Z20822 Contact with and (suspected) exposure to covid-19: Secondary | ICD-10-CM | POA: Diagnosis not present

## 2019-12-03 DIAGNOSIS — R2681 Unsteadiness on feet: Secondary | ICD-10-CM | POA: Diagnosis not present

## 2019-12-03 DIAGNOSIS — R41841 Cognitive communication deficit: Secondary | ICD-10-CM | POA: Diagnosis not present

## 2019-12-03 DIAGNOSIS — I10 Essential (primary) hypertension: Secondary | ICD-10-CM | POA: Diagnosis not present

## 2019-12-03 DIAGNOSIS — R1312 Dysphagia, oropharyngeal phase: Secondary | ICD-10-CM | POA: Diagnosis not present

## 2019-12-03 DIAGNOSIS — M84422S Pathological fracture, left humerus, sequela: Secondary | ICD-10-CM | POA: Diagnosis not present

## 2019-12-03 DIAGNOSIS — M6281 Muscle weakness (generalized): Secondary | ICD-10-CM | POA: Diagnosis not present

## 2019-12-04 DIAGNOSIS — R41841 Cognitive communication deficit: Secondary | ICD-10-CM | POA: Diagnosis not present

## 2019-12-04 DIAGNOSIS — I10 Essential (primary) hypertension: Secondary | ICD-10-CM | POA: Diagnosis not present

## 2019-12-04 DIAGNOSIS — M6281 Muscle weakness (generalized): Secondary | ICD-10-CM | POA: Diagnosis not present

## 2019-12-04 DIAGNOSIS — R1312 Dysphagia, oropharyngeal phase: Secondary | ICD-10-CM | POA: Diagnosis not present

## 2019-12-04 DIAGNOSIS — M84422S Pathological fracture, left humerus, sequela: Secondary | ICD-10-CM | POA: Diagnosis not present

## 2019-12-04 DIAGNOSIS — R2681 Unsteadiness on feet: Secondary | ICD-10-CM | POA: Diagnosis not present

## 2019-12-04 DIAGNOSIS — D649 Anemia, unspecified: Secondary | ICD-10-CM | POA: Diagnosis not present

## 2019-12-04 DIAGNOSIS — R197 Diarrhea, unspecified: Secondary | ICD-10-CM | POA: Diagnosis not present

## 2019-12-04 DIAGNOSIS — R5381 Other malaise: Secondary | ICD-10-CM | POA: Diagnosis not present

## 2019-12-04 DIAGNOSIS — S42202D Unspecified fracture of upper end of left humerus, subsequent encounter for fracture with routine healing: Secondary | ICD-10-CM | POA: Diagnosis not present

## 2019-12-05 DIAGNOSIS — M6281 Muscle weakness (generalized): Secondary | ICD-10-CM | POA: Diagnosis not present

## 2019-12-05 DIAGNOSIS — R41841 Cognitive communication deficit: Secondary | ICD-10-CM | POA: Diagnosis not present

## 2019-12-05 DIAGNOSIS — M84422S Pathological fracture, left humerus, sequela: Secondary | ICD-10-CM | POA: Diagnosis not present

## 2019-12-05 DIAGNOSIS — U071 COVID-19: Secondary | ICD-10-CM | POA: Diagnosis not present

## 2019-12-05 DIAGNOSIS — I10 Essential (primary) hypertension: Secondary | ICD-10-CM | POA: Diagnosis not present

## 2019-12-05 DIAGNOSIS — R1312 Dysphagia, oropharyngeal phase: Secondary | ICD-10-CM | POA: Diagnosis not present

## 2019-12-05 DIAGNOSIS — R2681 Unsteadiness on feet: Secondary | ICD-10-CM | POA: Diagnosis not present

## 2019-12-07 DIAGNOSIS — R41841 Cognitive communication deficit: Secondary | ICD-10-CM | POA: Diagnosis not present

## 2019-12-07 DIAGNOSIS — M6281 Muscle weakness (generalized): Secondary | ICD-10-CM | POA: Diagnosis not present

## 2019-12-07 DIAGNOSIS — R2681 Unsteadiness on feet: Secondary | ICD-10-CM | POA: Diagnosis not present

## 2019-12-07 DIAGNOSIS — R1312 Dysphagia, oropharyngeal phase: Secondary | ICD-10-CM | POA: Diagnosis not present

## 2019-12-07 DIAGNOSIS — I10 Essential (primary) hypertension: Secondary | ICD-10-CM | POA: Diagnosis not present

## 2019-12-07 DIAGNOSIS — M84422S Pathological fracture, left humerus, sequela: Secondary | ICD-10-CM | POA: Diagnosis not present

## 2019-12-08 DIAGNOSIS — M84422S Pathological fracture, left humerus, sequela: Secondary | ICD-10-CM | POA: Diagnosis not present

## 2019-12-08 DIAGNOSIS — F329 Major depressive disorder, single episode, unspecified: Secondary | ICD-10-CM | POA: Diagnosis not present

## 2019-12-08 DIAGNOSIS — R41841 Cognitive communication deficit: Secondary | ICD-10-CM | POA: Diagnosis not present

## 2019-12-08 DIAGNOSIS — M6281 Muscle weakness (generalized): Secondary | ICD-10-CM | POA: Diagnosis not present

## 2019-12-08 DIAGNOSIS — R2681 Unsteadiness on feet: Secondary | ICD-10-CM | POA: Diagnosis not present

## 2019-12-08 DIAGNOSIS — R1312 Dysphagia, oropharyngeal phase: Secondary | ICD-10-CM | POA: Diagnosis not present

## 2019-12-08 DIAGNOSIS — I10 Essential (primary) hypertension: Secondary | ICD-10-CM | POA: Diagnosis not present

## 2019-12-09 DIAGNOSIS — R2681 Unsteadiness on feet: Secondary | ICD-10-CM | POA: Diagnosis not present

## 2019-12-09 DIAGNOSIS — R1312 Dysphagia, oropharyngeal phase: Secondary | ICD-10-CM | POA: Diagnosis not present

## 2019-12-09 DIAGNOSIS — I10 Essential (primary) hypertension: Secondary | ICD-10-CM | POA: Diagnosis not present

## 2019-12-09 DIAGNOSIS — R41841 Cognitive communication deficit: Secondary | ICD-10-CM | POA: Diagnosis not present

## 2019-12-09 DIAGNOSIS — M6281 Muscle weakness (generalized): Secondary | ICD-10-CM | POA: Diagnosis not present

## 2019-12-09 DIAGNOSIS — M84422S Pathological fracture, left humerus, sequela: Secondary | ICD-10-CM | POA: Diagnosis not present

## 2019-12-10 DIAGNOSIS — U071 COVID-19: Secondary | ICD-10-CM | POA: Diagnosis not present

## 2019-12-10 DIAGNOSIS — R2681 Unsteadiness on feet: Secondary | ICD-10-CM | POA: Diagnosis not present

## 2019-12-10 DIAGNOSIS — M84422S Pathological fracture, left humerus, sequela: Secondary | ICD-10-CM | POA: Diagnosis not present

## 2019-12-10 DIAGNOSIS — I10 Essential (primary) hypertension: Secondary | ICD-10-CM | POA: Diagnosis not present

## 2019-12-10 DIAGNOSIS — M6281 Muscle weakness (generalized): Secondary | ICD-10-CM | POA: Diagnosis not present

## 2019-12-10 DIAGNOSIS — R41841 Cognitive communication deficit: Secondary | ICD-10-CM | POA: Diagnosis not present

## 2019-12-10 DIAGNOSIS — R1312 Dysphagia, oropharyngeal phase: Secondary | ICD-10-CM | POA: Diagnosis not present

## 2019-12-11 DIAGNOSIS — I10 Essential (primary) hypertension: Secondary | ICD-10-CM | POA: Diagnosis not present

## 2019-12-11 DIAGNOSIS — M6281 Muscle weakness (generalized): Secondary | ICD-10-CM | POA: Diagnosis not present

## 2019-12-11 DIAGNOSIS — R1312 Dysphagia, oropharyngeal phase: Secondary | ICD-10-CM | POA: Diagnosis not present

## 2019-12-11 DIAGNOSIS — M84422S Pathological fracture, left humerus, sequela: Secondary | ICD-10-CM | POA: Diagnosis not present

## 2019-12-11 DIAGNOSIS — K219 Gastro-esophageal reflux disease without esophagitis: Secondary | ICD-10-CM | POA: Diagnosis not present

## 2019-12-11 DIAGNOSIS — R41841 Cognitive communication deficit: Secondary | ICD-10-CM | POA: Diagnosis not present

## 2019-12-11 DIAGNOSIS — R197 Diarrhea, unspecified: Secondary | ICD-10-CM | POA: Diagnosis not present

## 2019-12-11 DIAGNOSIS — R2681 Unsteadiness on feet: Secondary | ICD-10-CM | POA: Diagnosis not present

## 2019-12-12 ENCOUNTER — Other Ambulatory Visit: Payer: Self-pay

## 2019-12-12 ENCOUNTER — Non-Acute Institutional Stay: Payer: Medicare Other

## 2019-12-12 DIAGNOSIS — R1312 Dysphagia, oropharyngeal phase: Secondary | ICD-10-CM | POA: Diagnosis not present

## 2019-12-12 DIAGNOSIS — R2681 Unsteadiness on feet: Secondary | ICD-10-CM | POA: Diagnosis not present

## 2019-12-12 DIAGNOSIS — M84422S Pathological fracture, left humerus, sequela: Secondary | ICD-10-CM | POA: Diagnosis not present

## 2019-12-12 DIAGNOSIS — Z515 Encounter for palliative care: Secondary | ICD-10-CM

## 2019-12-12 DIAGNOSIS — M6281 Muscle weakness (generalized): Secondary | ICD-10-CM | POA: Diagnosis not present

## 2019-12-12 DIAGNOSIS — R41841 Cognitive communication deficit: Secondary | ICD-10-CM | POA: Diagnosis not present

## 2019-12-12 DIAGNOSIS — I10 Essential (primary) hypertension: Secondary | ICD-10-CM | POA: Diagnosis not present

## 2019-12-12 NOTE — Progress Notes (Signed)
COMMUNITY PALLIATIVE CARE SW NOTE  PATIENT NAME: [name] DOB: November 12, 1920 MRN: [id number]  PRIMARY CARE PROVIDER: Wenda Low, MD  RESPONSIBLE PARTY:  Acct ID - Guarantor Home Phone Work Phone Relationship Acct Type  [id number] [name]  Self P/F     [place], [name], [place] [address]     PLAN OF CARE and INTERVENTIONS:             1. GOALS OF CARE/ ADVANCE CARE PLANNING:  Patient is a DNR.HCPOA is daughter, [name].Goal is for patient to return home. 2. SOCIAL/EMOTIONAL/SPIRITUAL ASSESSMENT/ INTERVENTIONS:  SW met with patient in her room at [facility]. Patient was sitting up in wheelchair, trying to eat lunch but said she does not feel hungry right now. Patient reports her appetite is fair. Patient denies pain at this time but reports pain in her back occasionally, especially when she stands up. Patient does not soreness in her left arm from the fracture but said they she is doing exercises and stretches. Patient said she is still weak overall. Patient said she has trouble falling asleep at times, but once she is asleep then she does sleep well. Patient said she is not napping as much now. Patient said she spends most of her time watching television. Patient also calls family and friends periodically throughout the day. SW provided supportive counseling, discussed goals and care plan, and used active and reflective listening. 3. PATIENT/CAREGIVER EDUCATION/ COPING:  Patient was alert,oriented to self and place.Patient does seem more confused. Patient is pleasant and calm. Patient said she is coping "okay", does desire to go home. Patient showed SW her flowers in the window and enjoys looking outside at the birds. Patient has supportive family. 4. PERSONAL EMERGENCY PLAN:  Facility to follow protocol. 5. COMMUNITY RESOURCES COORDINATION/ HEALTH CARE NAVIGATION:  Patient is receiving therapy at [facility]. SW spoke with Mariann Laster, nurse at facility. Mariann Laster said  facility provider is requesting to test patient for C. Diff. Mariann Laster said patient does not seem to have any concerns with bowels. Mariann Laster denies any other nursing concerns.  6. FINANCIAL/LEGAL CONCERNS/INTERVENTIONS:  None.     SOCIAL HX:  Social History   Tobacco Use  . Smoking status: Never Smoker  . Smokeless tobacco: Never Used  Substance Use Topics  . Alcohol use: No    CODE STATUS: DNR ADVANCED DIRECTIVES: Y MOST FORM COMPLETE:  No. HOSPICE EDUCATION PROVIDED: None.  PPS: Patient needsassistance with bathing and dressing. Patientis able tofeed herself.Patient is doing mostly transfers but is using her walker for short distances with PT.  I spent69mnutes with patient/family, [id number], support and consultation.  WMargaretmary Lombard LCSW

## 2019-12-13 DIAGNOSIS — R41841 Cognitive communication deficit: Secondary | ICD-10-CM | POA: Diagnosis not present

## 2019-12-13 DIAGNOSIS — M84422S Pathological fracture, left humerus, sequela: Secondary | ICD-10-CM | POA: Diagnosis not present

## 2019-12-13 DIAGNOSIS — M6281 Muscle weakness (generalized): Secondary | ICD-10-CM | POA: Diagnosis not present

## 2019-12-13 DIAGNOSIS — R1312 Dysphagia, oropharyngeal phase: Secondary | ICD-10-CM | POA: Diagnosis not present

## 2019-12-13 DIAGNOSIS — I10 Essential (primary) hypertension: Secondary | ICD-10-CM | POA: Diagnosis not present

## 2019-12-13 DIAGNOSIS — R2681 Unsteadiness on feet: Secondary | ICD-10-CM | POA: Diagnosis not present

## 2019-12-14 DIAGNOSIS — R2681 Unsteadiness on feet: Secondary | ICD-10-CM | POA: Diagnosis not present

## 2019-12-14 DIAGNOSIS — M6281 Muscle weakness (generalized): Secondary | ICD-10-CM | POA: Diagnosis not present

## 2019-12-14 DIAGNOSIS — M84422S Pathological fracture, left humerus, sequela: Secondary | ICD-10-CM | POA: Diagnosis not present

## 2019-12-14 DIAGNOSIS — R41841 Cognitive communication deficit: Secondary | ICD-10-CM | POA: Diagnosis not present

## 2019-12-14 DIAGNOSIS — R1312 Dysphagia, oropharyngeal phase: Secondary | ICD-10-CM | POA: Diagnosis not present

## 2019-12-14 DIAGNOSIS — I10 Essential (primary) hypertension: Secondary | ICD-10-CM | POA: Diagnosis not present

## 2019-12-15 DIAGNOSIS — D649 Anemia, unspecified: Secondary | ICD-10-CM | POA: Diagnosis not present

## 2019-12-15 DIAGNOSIS — R2681 Unsteadiness on feet: Secondary | ICD-10-CM | POA: Diagnosis not present

## 2019-12-15 DIAGNOSIS — R4182 Altered mental status, unspecified: Secondary | ICD-10-CM | POA: Diagnosis not present

## 2019-12-15 DIAGNOSIS — R1312 Dysphagia, oropharyngeal phase: Secondary | ICD-10-CM | POA: Diagnosis not present

## 2019-12-15 DIAGNOSIS — M6281 Muscle weakness (generalized): Secondary | ICD-10-CM | POA: Diagnosis not present

## 2019-12-15 DIAGNOSIS — I119 Hypertensive heart disease without heart failure: Secondary | ICD-10-CM | POA: Diagnosis not present

## 2019-12-15 DIAGNOSIS — R41841 Cognitive communication deficit: Secondary | ICD-10-CM | POA: Diagnosis not present

## 2019-12-15 DIAGNOSIS — R197 Diarrhea, unspecified: Secondary | ICD-10-CM | POA: Diagnosis not present

## 2019-12-15 DIAGNOSIS — I10 Essential (primary) hypertension: Secondary | ICD-10-CM | POA: Diagnosis not present

## 2019-12-15 DIAGNOSIS — M84422S Pathological fracture, left humerus, sequela: Secondary | ICD-10-CM | POA: Diagnosis not present

## 2019-12-16 DIAGNOSIS — M84422S Pathological fracture, left humerus, sequela: Secondary | ICD-10-CM | POA: Diagnosis not present

## 2019-12-16 DIAGNOSIS — I10 Essential (primary) hypertension: Secondary | ICD-10-CM | POA: Diagnosis not present

## 2019-12-16 DIAGNOSIS — E039 Hypothyroidism, unspecified: Secondary | ICD-10-CM | POA: Diagnosis not present

## 2019-12-16 DIAGNOSIS — R1312 Dysphagia, oropharyngeal phase: Secondary | ICD-10-CM | POA: Diagnosis not present

## 2019-12-16 DIAGNOSIS — R41841 Cognitive communication deficit: Secondary | ICD-10-CM | POA: Diagnosis not present

## 2019-12-16 DIAGNOSIS — R4182 Altered mental status, unspecified: Secondary | ICD-10-CM | POA: Diagnosis not present

## 2019-12-16 DIAGNOSIS — R627 Adult failure to thrive: Secondary | ICD-10-CM | POA: Diagnosis not present

## 2019-12-16 DIAGNOSIS — R2681 Unsteadiness on feet: Secondary | ICD-10-CM | POA: Diagnosis not present

## 2019-12-16 DIAGNOSIS — M6281 Muscle weakness (generalized): Secondary | ICD-10-CM | POA: Diagnosis not present

## 2019-12-17 DIAGNOSIS — R5383 Other fatigue: Secondary | ICD-10-CM | POA: Diagnosis not present

## 2019-12-17 DIAGNOSIS — R0989 Other specified symptoms and signs involving the circulatory and respiratory systems: Secondary | ICD-10-CM | POA: Diagnosis not present

## 2019-12-17 DIAGNOSIS — I10 Essential (primary) hypertension: Secondary | ICD-10-CM | POA: Diagnosis not present

## 2019-12-17 DIAGNOSIS — R2681 Unsteadiness on feet: Secondary | ICD-10-CM | POA: Diagnosis not present

## 2019-12-17 DIAGNOSIS — R41841 Cognitive communication deficit: Secondary | ICD-10-CM | POA: Diagnosis not present

## 2019-12-17 DIAGNOSIS — R1312 Dysphagia, oropharyngeal phase: Secondary | ICD-10-CM | POA: Diagnosis not present

## 2019-12-17 DIAGNOSIS — M84422S Pathological fracture, left humerus, sequela: Secondary | ICD-10-CM | POA: Diagnosis not present

## 2019-12-17 DIAGNOSIS — M6281 Muscle weakness (generalized): Secondary | ICD-10-CM | POA: Diagnosis not present

## 2019-12-18 DIAGNOSIS — R41841 Cognitive communication deficit: Secondary | ICD-10-CM | POA: Diagnosis not present

## 2019-12-18 DIAGNOSIS — M6281 Muscle weakness (generalized): Secondary | ICD-10-CM | POA: Diagnosis not present

## 2019-12-18 DIAGNOSIS — R634 Abnormal weight loss: Secondary | ICD-10-CM | POA: Diagnosis not present

## 2019-12-18 DIAGNOSIS — R2681 Unsteadiness on feet: Secondary | ICD-10-CM | POA: Diagnosis not present

## 2019-12-18 DIAGNOSIS — I10 Essential (primary) hypertension: Secondary | ICD-10-CM | POA: Diagnosis not present

## 2019-12-18 DIAGNOSIS — R4189 Other symptoms and signs involving cognitive functions and awareness: Secondary | ICD-10-CM | POA: Diagnosis not present

## 2019-12-18 DIAGNOSIS — R1312 Dysphagia, oropharyngeal phase: Secondary | ICD-10-CM | POA: Diagnosis not present

## 2019-12-18 DIAGNOSIS — M84422S Pathological fracture, left humerus, sequela: Secondary | ICD-10-CM | POA: Diagnosis not present

## 2019-12-19 ENCOUNTER — Telehealth: Payer: Self-pay | Admitting: Gastroenterology

## 2019-12-19 DIAGNOSIS — I10 Essential (primary) hypertension: Secondary | ICD-10-CM | POA: Diagnosis not present

## 2019-12-19 DIAGNOSIS — R41841 Cognitive communication deficit: Secondary | ICD-10-CM | POA: Diagnosis not present

## 2019-12-19 DIAGNOSIS — M84422S Pathological fracture, left humerus, sequela: Secondary | ICD-10-CM | POA: Diagnosis not present

## 2019-12-19 DIAGNOSIS — R2681 Unsteadiness on feet: Secondary | ICD-10-CM | POA: Diagnosis not present

## 2019-12-19 DIAGNOSIS — M6281 Muscle weakness (generalized): Secondary | ICD-10-CM | POA: Diagnosis not present

## 2019-12-19 DIAGNOSIS — R531 Weakness: Secondary | ICD-10-CM | POA: Diagnosis not present

## 2019-12-19 DIAGNOSIS — R5381 Other malaise: Secondary | ICD-10-CM | POA: Diagnosis not present

## 2019-12-19 DIAGNOSIS — R627 Adult failure to thrive: Secondary | ICD-10-CM | POA: Diagnosis not present

## 2019-12-19 DIAGNOSIS — R1312 Dysphagia, oropharyngeal phase: Secondary | ICD-10-CM | POA: Diagnosis not present

## 2019-12-19 NOTE — Telephone Encounter (Signed)
Pt's PCP is Cletus Gash, NP.  She can be reached at [phone].

## 2019-12-19 NOTE — Telephone Encounter (Signed)
DOD 12/19/19  Dr. Loletha Carrow, we have received a referral to evaluate this patient for weight loss, gastroenteritis and colitis.  Records from [facility] will be sent to you for review.  Please advise scheduling.

## 2019-12-19 NOTE — Telephone Encounter (Signed)
There is absolutely no clinical information in the records that were sent.  There are no office notes, lab reports, imaging reports (if such things exist), and the nature of the consult request is therefore entirely unclear.  Of note, this is a 86 year old nursing home resident on a blood thinner medicine with a DNR status, which means I would not be planning any endoscopic procedures on this patient.  It is not clear if caregivers at her facility would hope and expect that this patient should be transported from their facility to our office for an in person evaluation.  In sum, please obtain a phone number where this patient's provider that facility can be reached, and I will call them in the near future.

## 2019-12-22 DIAGNOSIS — R4182 Altered mental status, unspecified: Secondary | ICD-10-CM | POA: Diagnosis not present

## 2019-12-22 DIAGNOSIS — R531 Weakness: Secondary | ICD-10-CM | POA: Diagnosis not present

## 2019-12-22 DIAGNOSIS — R627 Adult failure to thrive: Secondary | ICD-10-CM | POA: Diagnosis not present

## 2019-12-23 NOTE — Telephone Encounter (Signed)
I spoke with Cletus Gash, NP today, and she informed me this patient's overall functional and neurological condition has declined, and she has been transition to comfort care.  As such, an office consult with [place] is no longer needed.  The provider will cancel it with her scheduling staff.   Please close out the referral.  - HD

## 2019-12-24 DIAGNOSIS — Z20828 Contact with and (suspected) exposure to other viral communicable diseases: Secondary | ICD-10-CM | POA: Diagnosis not present

## 2019-12-26 DIAGNOSIS — R627 Adult failure to thrive: Secondary | ICD-10-CM | POA: Diagnosis not present

## 2019-12-26 DIAGNOSIS — R5381 Other malaise: Secondary | ICD-10-CM | POA: Diagnosis not present

## 2019-12-29 DIAGNOSIS — F329 Major depressive disorder, single episode, unspecified: Secondary | ICD-10-CM | POA: Diagnosis not present

## 2020-01-01 DIAGNOSIS — R627 Adult failure to thrive: Secondary | ICD-10-CM | POA: Diagnosis not present

## 2020-01-01 DIAGNOSIS — R4182 Altered mental status, unspecified: Secondary | ICD-10-CM | POA: Diagnosis not present

## 2020-01-01 DIAGNOSIS — F329 Major depressive disorder, single episode, unspecified: Secondary | ICD-10-CM | POA: Diagnosis not present

## 2020-01-02 ENCOUNTER — Other Ambulatory Visit: Payer: Medicare Other

## 2020-01-02 ENCOUNTER — Other Ambulatory Visit: Payer: Self-pay

## 2020-01-02 DIAGNOSIS — Z515 Encounter for palliative care: Secondary | ICD-10-CM

## 2020-01-02 NOTE — Progress Notes (Signed)
PATIENT NAME: [name] DOB: 02/01/1921 MRN: [id number]  PRIMARY CARE PROVIDER: Wenda Low, MD  RESPONSIBLE PARTY:  Acct ID - Guarantor Home Phone Work Phone Relationship Acct Type  [id number] Marissa [phone]  Self P/F     [place], [name], [place] [address]    PLAN OF CARE and INTERVENTIONS:               1.  GOALS OF CARE/ ADVANCE CARE PLANNING:  Patient states she would like to return home but states daughter [name] doesn't feel this will happen.  Patient wants to be comfortable.               2.  PATIENT/CAREGIVER EDUCATION:  Education on fall precautions, education to use call bell and ask for assistance befor getting up, support               4. PERSONAL EMERGENCY PLAN:  Patient resides at skilled nursing facility with 24/7 care.               5.  DISEASE STATUS: RN made scheduled facility care visit. Patient sitting up in wheelchair in her room watching TV. Patient denies pain at the present time. Patient states she does have intermittent pain in her back. Patient states she would like to return home however daughter [name] has informed patient she does not think that's possible. Patients current weight is 90 pounds. Patients heart rate is irregular today ranging from 45 to 75 beats per minute. Patient reports her appetite is fair however she misses seasonings in food. Patient denies having any shortness of breath. Patient reports she has a non-productive cough at times. Patient denies suffering any recent falls however reports she has stumbled a few times. Education to patient not to get up by herself but to ring call bell and ask for assistance. Patient reports she has been getting a bath every other day. Patient denies having any difficulty with bowel movements. Patient states she has had no medication changes that she is aware of. Patient has had no recent hospitalizations. Patient has trace edema in her lower extremities. Patient talks about planting flowers and has  participated in activities at facility. Support provided to patient. Facility staff denies having any concerns regarding patient at the present time. Patient and facility staff encouraged to contact palliative care with questions or concerns.      HISTORY OF PRESENT ILLNESS: Patient is a 86 year old female who resides at [facility].  Patient is followed by palliative care and is seen monthly and PRN.    CODE STATUS: DNR  ADVANCED DIRECTIVES: Y MOST FORM: No PPS: 40%   PHYSICAL EXAM:   VITALS: Today's Vitals   01/02/20 1230  BP: 104/80  Pulse: (!) 55  Resp: 16  Temp: 98.3 F (36.8 C)  TempSrc: Temporal  SpO2: 98%  Weight: 90 lb (40.8 kg)  PainSc: 0-No pain    LUNGS: clear to auscultation  CARDIAC: Cor irreg, irreg RRR  EXTREMITIES: Trace edema SKIN: no visible open areas of skin breakdown   NEURO: positive for gait problems, memory problems and weakness       [name], RN

## 2020-01-05 DIAGNOSIS — R634 Abnormal weight loss: Secondary | ICD-10-CM | POA: Diagnosis not present

## 2020-01-05 DIAGNOSIS — F329 Major depressive disorder, single episode, unspecified: Secondary | ICD-10-CM | POA: Diagnosis not present

## 2020-01-05 DIAGNOSIS — F4320 Adjustment disorder, unspecified: Secondary | ICD-10-CM | POA: Diagnosis not present

## 2020-01-07 DIAGNOSIS — M549 Dorsalgia, unspecified: Secondary | ICD-10-CM | POA: Diagnosis not present

## 2020-01-07 DIAGNOSIS — Z9181 History of falling: Secondary | ICD-10-CM | POA: Diagnosis not present

## 2020-01-08 DIAGNOSIS — Z9181 History of falling: Secondary | ICD-10-CM | POA: Diagnosis not present

## 2020-01-08 DIAGNOSIS — R0781 Pleurodynia: Secondary | ICD-10-CM | POA: Diagnosis not present

## 2020-01-08 DIAGNOSIS — R634 Abnormal weight loss: Secondary | ICD-10-CM | POA: Diagnosis not present

## 2020-01-08 DIAGNOSIS — I10 Essential (primary) hypertension: Secondary | ICD-10-CM | POA: Diagnosis not present

## 2020-01-13 DIAGNOSIS — M549 Dorsalgia, unspecified: Secondary | ICD-10-CM | POA: Diagnosis not present

## 2020-01-14 DIAGNOSIS — Z20828 Contact with and (suspected) exposure to other viral communicable diseases: Secondary | ICD-10-CM | POA: Diagnosis not present

## 2020-01-16 DIAGNOSIS — M549 Dorsalgia, unspecified: Secondary | ICD-10-CM | POA: Diagnosis not present

## 2020-01-18 ENCOUNTER — Encounter (HOSPITAL_COMMUNITY): Payer: Self-pay | Admitting: Emergency Medicine

## 2020-01-18 ENCOUNTER — Emergency Department (HOSPITAL_COMMUNITY): Payer: Medicare Other

## 2020-01-18 ENCOUNTER — Inpatient Hospital Stay (HOSPITAL_COMMUNITY)
Admission: EM | Admit: 2020-01-18 | Discharge: 2020-01-20 | DRG: 378 | Disposition: A | Discharge status: Skilled Nursing Facility | Payer: Medicare Other | Source: Skilled Nursing Facility | Attending: Internal Medicine | Admitting: Internal Medicine

## 2020-01-18 DIAGNOSIS — Z85828 Personal history of other malignant neoplasm of skin: Secondary | ICD-10-CM

## 2020-01-18 DIAGNOSIS — M1990 Unspecified osteoarthritis, unspecified site: Secondary | ICD-10-CM | POA: Diagnosis present

## 2020-01-18 DIAGNOSIS — I129 Hypertensive chronic kidney disease with stage 1 through stage 4 chronic kidney disease, or unspecified chronic kidney disease: Secondary | ICD-10-CM | POA: Diagnosis present

## 2020-01-18 DIAGNOSIS — D649 Anemia, unspecified: Secondary | ICD-10-CM | POA: Diagnosis not present

## 2020-01-18 DIAGNOSIS — M810 Age-related osteoporosis without current pathological fracture: Secondary | ICD-10-CM | POA: Diagnosis present

## 2020-01-18 DIAGNOSIS — K625 Hemorrhage of anus and rectum: Secondary | ICD-10-CM | POA: Diagnosis not present

## 2020-01-18 DIAGNOSIS — K922 Gastrointestinal hemorrhage, unspecified: Secondary | ICD-10-CM | POA: Diagnosis present

## 2020-01-18 DIAGNOSIS — K649 Unspecified hemorrhoids: Secondary | ICD-10-CM | POA: Diagnosis present

## 2020-01-18 DIAGNOSIS — E039 Hypothyroidism, unspecified: Secondary | ICD-10-CM | POA: Diagnosis present

## 2020-01-18 DIAGNOSIS — Z20822 Contact with and (suspected) exposure to covid-19: Secondary | ICD-10-CM | POA: Diagnosis not present

## 2020-01-18 DIAGNOSIS — N183 Chronic kidney disease, stage 3 unspecified: Secondary | ICD-10-CM | POA: Diagnosis present

## 2020-01-18 DIAGNOSIS — Z886 Allergy status to analgesic agent status: Secondary | ICD-10-CM

## 2020-01-18 DIAGNOSIS — Z66 Do not resuscitate: Secondary | ICD-10-CM | POA: Diagnosis present

## 2020-01-18 DIAGNOSIS — R58 Hemorrhage, not elsewhere classified: Secondary | ICD-10-CM | POA: Diagnosis not present

## 2020-01-18 DIAGNOSIS — F419 Anxiety disorder, unspecified: Secondary | ICD-10-CM | POA: Diagnosis present

## 2020-01-18 DIAGNOSIS — K5790 Diverticulosis of intestine, part unspecified, without perforation or abscess without bleeding: Secondary | ICD-10-CM | POA: Diagnosis not present

## 2020-01-18 DIAGNOSIS — Z8673 Personal history of transient ischemic attack (TIA), and cerebral infarction without residual deficits: Secondary | ICD-10-CM

## 2020-01-18 DIAGNOSIS — I10 Essential (primary) hypertension: Secondary | ICD-10-CM | POA: Diagnosis not present

## 2020-01-18 DIAGNOSIS — Z79890 Hormone replacement therapy (postmenopausal): Secondary | ICD-10-CM

## 2020-01-18 DIAGNOSIS — Z7982 Long term (current) use of aspirin: Secondary | ICD-10-CM

## 2020-01-18 DIAGNOSIS — R531 Weakness: Secondary | ICD-10-CM | POA: Diagnosis not present

## 2020-01-18 DIAGNOSIS — E782 Mixed hyperlipidemia: Secondary | ICD-10-CM | POA: Diagnosis present

## 2020-01-18 DIAGNOSIS — D62 Acute posthemorrhagic anemia: Secondary | ICD-10-CM | POA: Diagnosis not present

## 2020-01-18 DIAGNOSIS — R0902 Hypoxemia: Secondary | ICD-10-CM | POA: Diagnosis not present

## 2020-01-18 DIAGNOSIS — K219 Gastro-esophageal reflux disease without esophagitis: Secondary | ICD-10-CM | POA: Diagnosis present

## 2020-01-18 DIAGNOSIS — G43909 Migraine, unspecified, not intractable, without status migrainosus: Secondary | ICD-10-CM | POA: Diagnosis present

## 2020-01-18 DIAGNOSIS — Z7902 Long term (current) use of antithrombotics/antiplatelets: Secondary | ICD-10-CM

## 2020-01-18 DIAGNOSIS — I471 Supraventricular tachycardia: Secondary | ICD-10-CM | POA: Diagnosis present

## 2020-01-18 DIAGNOSIS — Z79899 Other long term (current) drug therapy: Secondary | ICD-10-CM

## 2020-01-18 DIAGNOSIS — F329 Major depressive disorder, single episode, unspecified: Secondary | ICD-10-CM | POA: Diagnosis present

## 2020-01-18 DIAGNOSIS — Z888 Allergy status to other drugs, medicaments and biological substances status: Secondary | ICD-10-CM

## 2020-01-18 DIAGNOSIS — Z885 Allergy status to narcotic agent status: Secondary | ICD-10-CM

## 2020-01-18 DIAGNOSIS — I739 Peripheral vascular disease, unspecified: Secondary | ICD-10-CM | POA: Diagnosis present

## 2020-01-18 LAB — COMPREHENSIVE METABOLIC PANEL
ALT: 10 U/L (ref 0–44)
AST: 16 U/L (ref 15–41)
Albumin: 3.5 g/dL (ref 3.5–5.0)
Alkaline Phosphatase: 86 U/L (ref 38–126)
Anion gap: 7 (ref 5–15)
BUN: 28 mg/dL — ABNORMAL HIGH (ref 8–23)
CO2: 22 mmol/L (ref 22–32)
Calcium: 9.8 mg/dL (ref 8.9–10.3)
Chloride: 112 mmol/L — ABNORMAL HIGH (ref 98–111)
Creatinine, Ser: 1.04 mg/dL — ABNORMAL HIGH (ref 0.44–1.00)
GFR calc Af Amer: 53 mL/min — ABNORMAL LOW (ref >60)
GFR calc non Af Amer: 45 mL/min — ABNORMAL LOW (ref >60)
Glucose, Bld: 113 mg/dL — ABNORMAL HIGH (ref 70–99)
Potassium: 4.2 mmol/L (ref 3.5–5.1)
Sodium: 141 mmol/L (ref 135–145)
Total Bilirubin: 0.6 mg/dL (ref 0.3–1.2)
Total Protein: 6.5 g/dL (ref 6.5–8.1)

## 2020-01-18 LAB — CBC WITH DIFFERENTIAL/PLATELET
Abs Immature Granulocytes: 0.01 10*3/uL (ref 0.00–0.07)
Basophils Absolute: 0 10*3/uL (ref 0.0–0.1)
Basophils Relative: 0 %
Eosinophils Absolute: 0.2 10*3/uL (ref 0.0–0.5)
Eosinophils Relative: 4 %
HCT: 35.2 % — ABNORMAL LOW (ref 36.0–46.0)
Hemoglobin: 11.4 g/dL — ABNORMAL LOW (ref 12.0–15.0)
Immature Granulocytes: 0 %
Lymphocytes Relative: 24 %
Lymphs Abs: 1.4 10*3/uL (ref 0.7–4.0)
MCH: 30 pg (ref 26.0–34.0)
MCHC: 32.4 g/dL (ref 30.0–36.0)
MCV: 92.6 fL (ref 80.0–100.0)
Monocytes Absolute: 0.5 10*3/uL (ref 0.1–1.0)
Monocytes Relative: 9 %
Neutro Abs: 3.7 10*3/uL (ref 1.7–7.7)
Neutrophils Relative %: 63 %
Platelets: 202 10*3/uL (ref 150–400)
RBC: 3.8 MIL/uL — ABNORMAL LOW (ref 3.87–5.11)
RDW: 14 % (ref 11.5–15.5)
WBC: 5.9 10*3/uL (ref 4.0–10.5)
nRBC: 0 % (ref 0.0–0.2)

## 2020-01-18 LAB — TYPE AND SCREEN
ABO/RH(D): A POS
Antibody Screen: NEGATIVE

## 2020-01-18 LAB — POC OCCULT BLOOD, ED: Fecal Occult Bld: POSITIVE — AB

## 2020-01-18 LAB — APTT: aPTT: 41 seconds — ABNORMAL HIGH (ref 24–36)

## 2020-01-18 LAB — PROTIME-INR
INR: 1.2 (ref 0.8–1.2)
Prothrombin Time: 14.3 seconds (ref 11.4–15.2)

## 2020-01-18 MED ORDER — PANTOPRAZOLE SODIUM 40 MG IV SOLR
40.0000 mg | Freq: Once | INTRAVENOUS | Status: AC
  Administered 2020-01-18: 40 mg via INTRAVENOUS
  Filled 2020-01-18: qty 40

## 2020-01-18 NOTE — ED Provider Notes (Signed)
[facility] EMERGENCY DEPARTMENT Provider Note   CSN: [id number] Arrival date & time: 01/18/20  2205     History Chief Complaint  Patient presents with  . GI Bleeding    [name] is a 86 y.o. female.  HPI   Patient presented to the ED for evaluation of rectal bleeding.  Patient was noted to have rectal bleeding that started this morning.  Patient's had a large amount of blood loss according to the EMS report.  Patient denies any abdominal pain.  She denies any issues with fevers or chills.  States she has been feeling lightheaded.  She denies any prior history of rectal bleeding.  According to the medical records the patient's not on anticoagulants, she does take plavix Past Medical History:  Diagnosis Date  . Anemia   . Anginal pain ([place])   . Arthritis    "all over; doesn't seem to bother me much" (08/22/2013)  . Chronic lower back pain   . Dysrhythmia    "irregular heart beat"   . Exertional shortness of breath   . GERD (gastroesophageal reflux disease)   . Hypertension   . Hypothyroidism   . Migraines    "used to"  . Mixed hyperlipidemia   . Osteoporosis   . Paroxysmal supraventricular tachycardia ([place])   . Skin cancer    "couple taken off right leg" (08/22/2013)    Patient Active Problem List   Diagnosis Date Noted  . E. coli UTI 12/04/2018  . UTI due to Klebsiella species 12/04/2018  . Abnormal magnetic resonance cholangiopancreatography (MRCP) 12/04/2018  . Hypophosphatemia 12/04/2018  . Hypokalemia 12/04/2018  . Proctitis 12/04/2018  . Hypomagnesemia 12/04/2018  . AKI (acute kidney injury) ([place]) 11/28/2018  . Viral gastroenteritis 11/28/2018  . Generalized weakness 11/28/2018  . Chronic anemia 11/28/2018  . Thrombocytopenia ([place]) 11/28/2018  . Chronic bilateral low back pain 10/31/2016  . Closed subcapital fracture of femur, left, initial encounter ([place]) 10/16/2016  . Peripheral vascular disease ([place]) 10/16/2016  . Essential  hypertension 10/16/2016  . Hypothyroidism, acquired 10/16/2016  . Dry eye 04/21/2015  . H/O surgical procedure 05/25/2014  . 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 Pseudoaphakia 04/10/2014  . Acute appendicitis 08/22/2013    Past Surgical History:  Procedure Laterality Date  . ABDOMINAL HYSTERECTOMY    . ANTERIOR APPROACH HEMI HIP ARTHROPLASTY Left 10/18/2016   Procedure: ANTERIOR APPROACH LEFT HEMI HIP ARTHROPLASTY;  Surgeon: Leandrew Koyanagi, MD;  Location: [facility];  Service: Orthopedics;  Laterality: Left;  . APPENDECTOMY  08/22/2013  . BACK SURGERY    . CATARACT EXTRACTION W/ INTRAOCULAR LENS  IMPLANT, BILATERAL    . LAPAROSCOPIC APPENDECTOMY N/A 08/22/2013   Procedure: APPENDECTOMY LAPAROSCOPIC;  Surgeon: Imogene Burn. [name], MD;  Location: [facility];  Service: General;  Laterality: N/A;  . LUMBAR [place] SURGERY  2008  . THROAT SURGERY  1940's; ?   "cysts removed"      OB History   No obstetric history on file.     No family history on file.  Social History   Tobacco Use  . Smoking status: Never Smoker  . Smokeless tobacco: Never Used  Substance Use Topics  . Alcohol use: No  . Drug use: No    Home Medications Prior to Admission medications   Medication Sig Start Date End Date Taking? Authorizing Provider  acetaminophen (TYLENOL) 500 MG tablet Take 2 tablets (1,000 mg total) by mouth 3 (three) times daily. X 3 DAYS Patient taking differently: Take 1,000 mg  by mouth at bedtime.  10/20/16   Rai, Ripudeep K, MD  amLODipine (NORVASC) 5 MG tablet Take 1 tablet (5 mg total) by mouth daily. 12/12/18   Danford, Suann Larry, MD  benzonatate (TESSALON) 100 MG capsule Take 1 capsule (100 mg total) by mouth 3 (three) times daily as needed for cough. 12/09/18   Eugenie Filler, MD  bimatoprost (LUMIGAN) 0.01 % SOLN Place 1 drop into both eyes at bedtime.    [provider]  BIOTIN PO Take 1 tablet by mouth daily.    [provider]  bisacodyl (DULCOLAX) 10 MG suppository  Place 1 suppository (10 mg total) rectally daily as needed for moderate constipation. Patient not taking: Reported on 11/28/2018 10/20/16   Rai, Vernelle Emerald, MD  brimonidine (ALPHAGAN) 0.15 % ophthalmic solution Place 1 drop into the left eye 3 (three) times daily.    [provider]  Calcium Carb-Cholecalciferol (CALCIUM 600+D3 PO) Take 1 tablet by mouth 2 (two) times daily.     [provider]  clopidogrel (PLAVIX) 75 MG tablet Take 75 mg by mouth every other day.  09/26/18   [provider]  diltiazem (CARDIZEM CD) 120 MG 24 hr capsule Take 120 mg by mouth at bedtime.  08/31/18   [provider]  dorzolamide-timolol (COSOPT) 22.3-6.8 MG/ML ophthalmic solution Place 1 drop into the left eye 2 (two) times daily. 05/07/14   [provider]  feeding supplement, ENSURE ENLIVE, (ENSURE ENLIVE) LIQD Take 237 mLs by mouth 2 (two) times daily between meals. 10/20/16   Rai, Ripudeep K, MD  ferrous sulfate 325 (65 FE) MG EC tablet Take 325 mg by mouth every evening.    [provider]  fexofenadine (ALLEGRA) 180 MG tablet Take 180 mg by mouth daily.    [provider]  gemfibrozil (LOPID) 600 MG tablet Take 600 mg by mouth 2 (two) times daily before a meal.    [provider]  hydrALAZINE (APRESOLINE) 25 MG tablet Take 2 tablets (50 mg total) by mouth 2 (two) times a day. 12/11/18   Danford, Suann Larry, MD  levothyroxine (SYNTHROID, LEVOTHROID) 50 MCG tablet Take 25-50 mcg by mouth See admin instructions. Take 25 mcg by mouth in the morning before breakfast, alternating with 50 mcg (every other morning)    [provider]  loperamide (IMODIUM) 2 MG capsule Take 2-4 mg by mouth as needed for diarrhea or loose stools.     [provider]  metoprolol (LOPRESSOR) 50 MG tablet Take 50 mg by mouth 2 (two) times daily.    [provider]  nitroGLYCERIN (NITROSTAT) 0.4 MG SL tablet Place 1 tablet (0.4 mg total) under the tongue  every 5 (five) minutes as needed for chest pain. 12/11/18   Danford, Suann Larry, MD  Omega-3 Fatty Acids (FISH OIL) 1000 MG CAPS Take 1,000 mg by mouth 2 (two) times daily.     [provider]  pantoprazole (PROTONIX) 40 MG tablet Take 1 tablet (40 mg total) by mouth 2 (two) times daily before a meal. Take 2 times daily for 2 weeks, then 1 tablet daily/ 12/09/18   Eugenie Filler, MD  sertraline (ZOLOFT) 100 MG tablet Take 100 mg by mouth at bedtime.     [provider]    Allergies    Codeine, Gabapentin, Tramadol, and Other  Review of Systems   Review of Systems  All other systems reviewed and are negative.   Physical Exam Updated Vital Signs BP Marland Kitchen)  162/46   Pulse (!) 55   Temp 98.3 F (36.8 C) (Oral)   Resp 19   SpO2 96%   Physical Exam Vitals and nursing note reviewed.  Constitutional:      Appearance: She is well-developed. She is not diaphoretic.     Comments: Elderly, frail  HENT:     Head: Normocephalic and atraumatic.     Right Ear: External ear normal.     Left Ear: External ear normal.  Eyes:     General: No scleral icterus.       Right eye: No discharge.        Left eye: No discharge.     Conjunctiva/sclera: Conjunctivae normal.  Neck:     Trachea: No tracheal deviation.  Cardiovascular:     Rate and Rhythm: Normal rate and regular rhythm.  Pulmonary:     Effort: Pulmonary effort is normal. No respiratory distress.     Breath sounds: Normal breath sounds. No stridor. No wheezing or rales.  Abdominal:     General: Bowel sounds are normal. There is no distension.     Palpations: Abdomen is soft.     Tenderness: There is no abdominal tenderness. There is no guarding or rebound.  Genitourinary:    Comments: Maroon stool noted on rectal exam, no mass appreciated Musculoskeletal:        General: No tenderness.     Cervical back: Neck supple.  Skin:    General: Skin is warm and dry.     Findings: No rash.  Neurological:     Mental  Status: She is alert.     Cranial Nerves: No cranial nerve deficit (no facial droop, extraocular movements intact, no slurred speech).     Sensory: No sensory deficit.     Motor: No abnormal muscle tone or seizure activity.     Coordination: Coordination normal.     Comments: Patient moves all extremities but does appear generally weak, and movements are slow     ED Results / Procedures / Treatments   Labs (all labs ordered are listed, but only abnormal results are displayed) Labs Reviewed  COMPREHENSIVE METABOLIC PANEL - Abnormal; Notable for the following components:      Result Value   Chloride 112 (*)    Glucose, Bld 113 (*)    BUN 28 (*)    Creatinine, Ser 1.04 (*)    GFR calc non Af Amer 45 (*)    GFR calc Af Amer 53 (*)    All other components within normal limits  CBC WITH DIFFERENTIAL/PLATELET - Abnormal; Notable for the following components:   RBC 3.80 (*)    Hemoglobin 11.4 (*)    HCT 35.2 (*)    All other components within normal limits  APTT - Abnormal; Notable for the following components:   aPTT 41 (*)    All other components within normal limits  POC OCCULT BLOOD, ED - Abnormal; Notable for the following components:   Fecal Occult Bld POSITIVE (*)    All other components within normal limits  SARS CORONAVIRUS 2 BY RT PCR (HOSPITAL ORDER, [facility])  PROTIME-INR  TYPE AND SCREEN    EKG None  Radiology DG Chest Port 1 View  Result Date: 01/18/2020 CLINICAL DATA:  Weakness.  GI bleed. EXAM: PORTABLE CHEST 1 VIEW COMPARISON:  12/06/2018 FINDINGS: Upper normal heart size with unchanged mediastinal contours. Aortic atherosclerosis. Left lung base atelectasis or scarring. Right lung is clear. No pneumothorax, pulmonary  edema, or significant pleural effusion. Interval healing of left proximal humerus fracture from initial injury radiographs January 2021. IMPRESSION: Left lung base atelectasis or scarring. Aortic Atherosclerosis  (ICD10-I70.0). Electronically Signed   By: Keith Rake M.D.   On: 01/18/2020 23:05    Procedures Procedures (including critical care time)  Medications Ordered in ED Medications  pantoprazole (PROTONIX) injection 40 mg (has no administration in time range)    ED Course  I have reviewed the triage vital signs and the nursing notes.  Pertinent labs & imaging results that were available during my care of the patient were reviewed by me and considered in my medical decision making (see chart for details).    MDM Rules/Calculators/A&P                      Patient presents to the ED for evaluation of acute GI bleeding.  Patient noted to have a large volume of gelatinous bloody stool.  In the ED she remains hemodynamically stable and is in fact hypertensive.  Laboratory test did not show evidence of significant anemia.  I discussed the case with the patient's daughter.  She and the patient would be amenable to blood transfusions if necessary.  Right now her hemoglobin is stable and she does not require blood transfusion.  I will consult the medical service so that she can be admitted for observation, serial hemoglobin and GI consultation. Final Clinical Impression(s) / ED Diagnoses Final diagnoses:  Weakness  Acute GI bleeding     Dorie Rank, MD 01/18/20 2327

## 2020-01-18 NOTE — ED Notes (Signed)
Daughter is POA. Patient has MOST form that states comfort measures only. Patient is A/OX4 and states that she will receive treatment (i.e. fluids, blood products, etc.)

## 2020-01-18 NOTE — ED Triage Notes (Signed)
BIB EMS from [facility]. Per facility patient has had rectal bleeding onset this AM. Denies abd pain, N/V. Hypertensive en route, BP 214/77. A/OX4

## 2020-01-19 ENCOUNTER — Encounter (HOSPITAL_COMMUNITY): Payer: Self-pay | Admitting: Internal Medicine

## 2020-01-19 DIAGNOSIS — Z888 Allergy status to other drugs, medicaments and biological substances status: Secondary | ICD-10-CM | POA: Diagnosis not present

## 2020-01-19 DIAGNOSIS — E782 Mixed hyperlipidemia: Secondary | ICD-10-CM | POA: Diagnosis present

## 2020-01-19 DIAGNOSIS — Z20822 Contact with and (suspected) exposure to covid-19: Secondary | ICD-10-CM | POA: Diagnosis present

## 2020-01-19 DIAGNOSIS — F419 Anxiety disorder, unspecified: Secondary | ICD-10-CM | POA: Diagnosis present

## 2020-01-19 DIAGNOSIS — I10 Essential (primary) hypertension: Secondary | ICD-10-CM | POA: Diagnosis not present

## 2020-01-19 DIAGNOSIS — K219 Gastro-esophageal reflux disease without esophagitis: Secondary | ICD-10-CM | POA: Diagnosis present

## 2020-01-19 DIAGNOSIS — D62 Acute posthemorrhagic anemia: Secondary | ICD-10-CM

## 2020-01-19 DIAGNOSIS — Z8673 Personal history of transient ischemic attack (TIA), and cerebral infarction without residual deficits: Secondary | ICD-10-CM | POA: Diagnosis not present

## 2020-01-19 DIAGNOSIS — I739 Peripheral vascular disease, unspecified: Secondary | ICD-10-CM | POA: Diagnosis present

## 2020-01-19 DIAGNOSIS — K922 Gastrointestinal hemorrhage, unspecified: Secondary | ICD-10-CM | POA: Diagnosis not present

## 2020-01-19 DIAGNOSIS — Z886 Allergy status to analgesic agent status: Secondary | ICD-10-CM | POA: Diagnosis not present

## 2020-01-19 DIAGNOSIS — N183 Chronic kidney disease, stage 3 unspecified: Secondary | ICD-10-CM | POA: Diagnosis present

## 2020-01-19 DIAGNOSIS — Z7902 Long term (current) use of antithrombotics/antiplatelets: Secondary | ICD-10-CM | POA: Diagnosis not present

## 2020-01-19 DIAGNOSIS — M1990 Unspecified osteoarthritis, unspecified site: Secondary | ICD-10-CM | POA: Diagnosis present

## 2020-01-19 DIAGNOSIS — M810 Age-related osteoporosis without current pathological fracture: Secondary | ICD-10-CM | POA: Diagnosis present

## 2020-01-19 DIAGNOSIS — G459 Transient cerebral ischemic attack, unspecified: Secondary | ICD-10-CM | POA: Diagnosis not present

## 2020-01-19 DIAGNOSIS — Z66 Do not resuscitate: Secondary | ICD-10-CM | POA: Diagnosis present

## 2020-01-19 DIAGNOSIS — G43909 Migraine, unspecified, not intractable, without status migrainosus: Secondary | ICD-10-CM | POA: Diagnosis present

## 2020-01-19 DIAGNOSIS — Z7982 Long term (current) use of aspirin: Secondary | ICD-10-CM | POA: Diagnosis not present

## 2020-01-19 DIAGNOSIS — K5790 Diverticulosis of intestine, part unspecified, without perforation or abscess without bleeding: Secondary | ICD-10-CM | POA: Diagnosis present

## 2020-01-19 DIAGNOSIS — K649 Unspecified hemorrhoids: Secondary | ICD-10-CM | POA: Diagnosis present

## 2020-01-19 DIAGNOSIS — I129 Hypertensive chronic kidney disease with stage 1 through stage 4 chronic kidney disease, or unspecified chronic kidney disease: Secondary | ICD-10-CM | POA: Diagnosis present

## 2020-01-19 DIAGNOSIS — E039 Hypothyroidism, unspecified: Secondary | ICD-10-CM | POA: Diagnosis not present

## 2020-01-19 DIAGNOSIS — F329 Major depressive disorder, single episode, unspecified: Secondary | ICD-10-CM | POA: Diagnosis present

## 2020-01-19 DIAGNOSIS — Z885 Allergy status to narcotic agent status: Secondary | ICD-10-CM | POA: Diagnosis not present

## 2020-01-19 DIAGNOSIS — I471 Supraventricular tachycardia: Secondary | ICD-10-CM | POA: Diagnosis present

## 2020-01-19 DIAGNOSIS — K625 Hemorrhage of anus and rectum: Secondary | ICD-10-CM | POA: Diagnosis not present

## 2020-01-19 LAB — CBC
HCT: 28.4 % — ABNORMAL LOW (ref 36.0–46.0)
HCT: 29 % — ABNORMAL LOW (ref 36.0–46.0)
HCT: 29.7 % — ABNORMAL LOW (ref 36.0–46.0)
HCT: 31.8 % — ABNORMAL LOW (ref 36.0–46.0)
Hemoglobin: 10.4 g/dL — ABNORMAL LOW (ref 12.0–15.0)
Hemoglobin: 8.9 g/dL — ABNORMAL LOW (ref 12.0–15.0)
Hemoglobin: 9.2 g/dL — ABNORMAL LOW (ref 12.0–15.0)
Hemoglobin: 9.6 g/dL — ABNORMAL LOW (ref 12.0–15.0)
MCH: 29.8 pg (ref 26.0–34.0)
MCH: 29.9 pg (ref 26.0–34.0)
MCH: 30.3 pg (ref 26.0–34.0)
MCH: 31 pg (ref 26.0–34.0)
MCHC: 31.3 g/dL (ref 30.0–36.0)
MCHC: 31.7 g/dL (ref 30.0–36.0)
MCHC: 32.3 g/dL (ref 30.0–36.0)
MCHC: 32.7 g/dL (ref 30.0–36.0)
MCV: 93.7 fL (ref 80.0–100.0)
MCV: 94.2 fL (ref 80.0–100.0)
MCV: 94.6 fL (ref 80.0–100.0)
MCV: 95 fL (ref 80.0–100.0)
Platelets: 147 10*3/uL — ABNORMAL LOW (ref 150–400)
Platelets: 157 10*3/uL (ref 150–400)
Platelets: 181 10*3/uL (ref 150–400)
Platelets: 192 10*3/uL (ref 150–400)
RBC: 2.99 MIL/uL — ABNORMAL LOW (ref 3.87–5.11)
RBC: 3.08 MIL/uL — ABNORMAL LOW (ref 3.87–5.11)
RBC: 3.17 MIL/uL — ABNORMAL LOW (ref 3.87–5.11)
RBC: 3.36 MIL/uL — ABNORMAL LOW (ref 3.87–5.11)
RDW: 14.1 % (ref 11.5–15.5)
RDW: 14.1 % (ref 11.5–15.5)
RDW: 14.2 % (ref 11.5–15.5)
RDW: 14.2 % (ref 11.5–15.5)
WBC: 3.8 10*3/uL — ABNORMAL LOW (ref 4.0–10.5)
WBC: 3.9 10*3/uL — ABNORMAL LOW (ref 4.0–10.5)
WBC: 5 10*3/uL (ref 4.0–10.5)
WBC: 6.1 10*3/uL (ref 4.0–10.5)
nRBC: 0 % (ref 0.0–0.2)
nRBC: 0 % (ref 0.0–0.2)
nRBC: 0 % (ref 0.0–0.2)
nRBC: 0 % (ref 0.0–0.2)

## 2020-01-19 LAB — HEMOGLOBIN AND HEMATOCRIT, BLOOD
HCT: 27.4 % — ABNORMAL LOW (ref 36.0–46.0)
Hemoglobin: 8.8 g/dL — ABNORMAL LOW (ref 12.0–15.0)

## 2020-01-19 LAB — GLUCOSE, CAPILLARY
Glucose-Capillary: 98 mg/dL (ref 70–99)
Glucose-Capillary: 104 mg/dL — ABNORMAL HIGH (ref 70–99)
Glucose-Capillary: 100 mg/dL — ABNORMAL HIGH (ref 70–99)

## 2020-01-19 LAB — IRON AND TIBC
Iron: 49 ug/dL (ref 28–170)
Saturation Ratios: 20 % (ref 10.4–31.8)
TIBC: 244 ug/dL — ABNORMAL LOW (ref 250–450)
UIBC: 195 ug/dL

## 2020-01-19 LAB — SARS CORONAVIRUS 2 BY RT PCR (HOSPITAL ORDER, PERFORMED IN ~~LOC~~ HOSPITAL LAB): SARS Coronavirus 2: NEGATIVE

## 2020-01-19 LAB — FERRITIN: Ferritin: 105 ng/mL (ref 11–307)

## 2020-01-19 MED ORDER — LATANOPROST 0.005 % OP SOLN
1.0000 [drp] | Freq: Every day | OPHTHALMIC | Status: DC
  Administered 2020-01-19 (×2): 1 [drp] via OPHTHALMIC
  Filled 2020-01-19: qty 2.5

## 2020-01-19 MED ORDER — MIRTAZAPINE 15 MG PO TABS
15.0000 mg | ORAL_TABLET | Freq: Every day | ORAL | Status: DC
  Administered 2020-01-19: 15 mg via ORAL
  Filled 2020-01-19 (×2): qty 1

## 2020-01-19 MED ORDER — LEVOTHYROXINE SODIUM 50 MCG PO TABS
50.0000 ug | ORAL_TABLET | ORAL | Status: DC
  Administered 2020-01-20: 50 ug via ORAL
  Filled 2020-01-19: qty 1

## 2020-01-19 MED ORDER — DEXTROSE-NACL 5-0.9 % IV SOLN: INTRAVENOUS | Status: DC

## 2020-01-19 MED ORDER — ONDANSETRON HCL 4 MG PO TABS: 4.0000 mg | ORAL_TABLET | Freq: Four times a day (QID) | ORAL | Status: DC | PRN

## 2020-01-19 MED ORDER — METOPROLOL TARTRATE 50 MG PO TABS
50.0000 mg | ORAL_TABLET | Freq: Two times a day (BID) | ORAL | Status: DC
  Administered 2020-01-19 – 2020-01-20 (×2): 50 mg via ORAL
  Filled 2020-01-19 (×4): qty 1

## 2020-01-19 MED ORDER — BRIMONIDINE TARTRATE 0.15 % OP SOLN
1.0000 [drp] | Freq: Three times a day (TID) | OPHTHALMIC | Status: DC
  Administered 2020-01-19 – 2020-01-20 (×5): 1 [drp] via OPHTHALMIC
  Filled 2020-01-19: qty 5

## 2020-01-19 MED ORDER — HYDRALAZINE HCL 20 MG/ML IJ SOLN: 10.0000 mg | INTRAMUSCULAR | Status: DC | PRN

## 2020-01-19 MED ORDER — ONDANSETRON HCL 4 MG/2ML IJ SOLN: 4.0000 mg | Freq: Four times a day (QID) | INTRAMUSCULAR | Status: DC | PRN

## 2020-01-19 MED ORDER — FAMOTIDINE 20 MG PO TABS
20.0000 mg | ORAL_TABLET | Freq: Every day | ORAL | Status: DC
  Administered 2020-01-19 – 2020-01-20 (×2): 20 mg via ORAL
  Filled 2020-01-19 (×2): qty 1

## 2020-01-19 MED ORDER — DILTIAZEM HCL ER COATED BEADS 120 MG PO CP24
120.0000 mg | ORAL_CAPSULE | Freq: Every day | ORAL | Status: DC
  Administered 2020-01-19 – 2020-01-20 (×2): 120 mg via ORAL
  Filled 2020-01-19 (×2): qty 1

## 2020-01-19 MED ORDER — DORZOLAMIDE HCL-TIMOLOL MAL 2-0.5 % OP SOLN
1.0000 [drp] | Freq: Two times a day (BID) | OPHTHALMIC | Status: DC
  Administered 2020-01-19 – 2020-01-20 (×4): 1 [drp] via OPHTHALMIC
  Filled 2020-01-19: qty 10

## 2020-01-19 MED ORDER — PANTOPRAZOLE SODIUM 40 MG IV SOLR
40.0000 mg | INTRAVENOUS | Status: DC
  Administered 2020-01-19 – 2020-01-20 (×2): 40 mg via INTRAVENOUS
  Filled 2020-01-19 (×2): qty 40

## 2020-01-19 MED ORDER — SERTRALINE HCL 100 MG PO TABS
100.0000 mg | ORAL_TABLET | Freq: Every day | ORAL | Status: DC
  Administered 2020-01-19 – 2020-01-20 (×2): 100 mg via ORAL
  Filled 2020-01-19 (×2): qty 1

## 2020-01-19 MED ORDER — ACETAMINOPHEN 325 MG PO TABS
650.0000 mg | ORAL_TABLET | Freq: Four times a day (QID) | ORAL | Status: DC | PRN
  Administered 2020-01-19 – 2020-01-20 (×2): 650 mg via ORAL
  Filled 2020-01-19 (×2): qty 2

## 2020-01-19 MED ORDER — LEVOTHYROXINE SODIUM 25 MCG PO TABS
25.0000 ug | ORAL_TABLET | ORAL | Status: DC
  Administered 2020-01-19: 25 ug via ORAL
  Filled 2020-01-19 (×2): qty 1

## 2020-01-19 NOTE — H&P (Signed)
History and Physical    [name] [id number] DOB: 08-12-1921 DOA: 01/18/2020  PCP: Marissa Low, MD  Patient coming from: [name].  Chief Complaint: Rectal bleeding.  HPI: [name] is a 86 y.o. female with history of hypertension chronic kidney disease stage III, hypothyroidism and peripheral vascular disease on Plavix was found to have a large amount of rectal bleeding at the living facility and was brought to the ER.  Per report patient did not have any abdominal pain nausea vomiting fever or chills.  Patient is a poor historian but does say that she does not hurt in the abdomen.  ED Course: In the ER patient was hemodynamically stable hemoglobin was around 11.4 which is around her baseline.  Given the history of having large bowel movement patient after discussing with patient's daughter was admitted for further observation.  Patient's daughter is agreeable to transfusion but does not want any active measures like endoscopy as discussed with the ER physician.  Covid test is negative.  Blood pressure was found to be elevated will closely monitor.  Creatinine is around 1.04 which is around the baseline.  Review of Systems: As per HPI, rest all negative.   Past Medical History:  Diagnosis Date  . Anemia   . Anginal pain ([place])   . Arthritis    "all over; doesn't seem to bother me much" (08/22/2013)  . Chronic lower back pain   . Dysrhythmia    "irregular heart beat"   . Exertional shortness of breath   . GERD (gastroesophageal reflux disease)   . Hypertension   . Hypothyroidism   . Migraines    "used to"  . Mixed hyperlipidemia   . Osteoporosis   . Paroxysmal supraventricular tachycardia ([place])   . Skin cancer    "couple taken off right leg" (08/22/2013)    Past Surgical History:  Procedure Laterality Date  . ABDOMINAL HYSTERECTOMY    . ANTERIOR APPROACH HEMI HIP ARTHROPLASTY Left 10/18/2016   Procedure: ANTERIOR APPROACH LEFT HEMI HIP ARTHROPLASTY;   Surgeon: Leandrew Koyanagi, MD;  Location: [facility];  Service: Orthopedics;  Laterality: Left;  . APPENDECTOMY  08/22/2013  . BACK SURGERY    . CATARACT EXTRACTION W/ INTRAOCULAR LENS  IMPLANT, BILATERAL    . LAPAROSCOPIC APPENDECTOMY N/A 08/22/2013   Procedure: APPENDECTOMY LAPAROSCOPIC;  Surgeon: Imogene Burn. [name], MD;  Location: [facility];  Service: General;  Laterality: N/A;  . LUMBAR [place] SURGERY  2008  . THROAT SURGERY  1940's; ?   "cysts removed"      reports that she has never smoked. She has never used smokeless tobacco. She reports that she does not drink alcohol or use drugs.  Allergies  Allergen Reactions  . Codeine Other (See Comments)    "I feel like I'm floating"   . Gabapentin Other (See Comments)    Cannot be on for a good length of time- causes a lot of lethargy  . Tramadol Other (See Comments)    On MAR  . Other Other (See Comments)    All pain meds cause lethargy and disorientation    Family History  Family history unknown: Yes    Prior to Admission medications   Medication Sig Start Date End Date Taking? Authorizing Provider  acetaminophen (TYLENOL) 325 MG tablet Take 650 mg by mouth in the morning, at noon, in the evening, and at bedtime.   Yes [provider]  ascorbic acid (VITAMIN C) 500 MG tablet Take 500 mg by  mouth daily.   Yes [provider]  bimatoprost (LUMIGAN) 0.01 % SOLN Place 1 drop into both eyes at bedtime.   Yes [provider]  brimonidine (ALPHAGAN) 0.15 % ophthalmic solution Place 1 drop into the left eye 3 (three) times daily.   Yes [provider]  Calcium 600-200 MG-UNIT tablet Take 1 tablet by mouth daily.   Yes [provider]  cloNIDine (CATAPRES) 0.1 MG tablet Take 0.1 mg by mouth daily as needed (for SBP>180).   Yes [provider]  clopidogrel (PLAVIX) 75 MG tablet Take 75 mg by mouth daily.  09/26/18  Yes [provider]  Colesevelam HCl (WELCHOL) 3.75 g PACK Take 3.75 g by mouth  daily.   Yes [provider]  diltiazem (CARDIZEM CD) 120 MG 24 hr capsule Take 120 mg by mouth daily.  08/31/18  Yes [provider]  dorzolamide-timolol (COSOPT) 22.3-6.8 MG/ML ophthalmic solution Place 1 drop into the left eye 2 (two) times daily. 05/07/14  Yes [provider]  famotidine (PEPCID) 20 MG tablet Take 20 mg by mouth daily.   Yes [provider]  ferrous sulfate 325 (65 FE) MG EC tablet Take 325 mg by mouth daily with breakfast.    Yes [provider]  fexofenadine (ALLEGRA) 60 MG tablet Take 60 mg by mouth 2 (two) times daily.   Yes [provider]  lactose free nutrition (BOOST) LIQD Take 237 mLs by mouth daily.   Yes [provider]  levothyroxine (SYNTHROID) 25 MCG tablet Take 25 mcg by mouth every other day.   Yes [provider]  levothyroxine (SYNTHROID, LEVOTHROID) 50 MCG tablet Take 50 mcg by mouth every other day.    Yes [provider]  Lidocaine HCl (ASPERCREME LIDOCAINE) 4 % CREA Apply topically in the morning and at bedtime. To affected area of mid-back   Yes [provider]  lisinopril (ZESTRIL) 20 MG tablet Take 20 mg by mouth daily.   Yes [provider]  loperamide (IMODIUM A-D) 2 MG tablet Take 2 mg by mouth daily as needed for diarrhea or loose stools.   Yes [provider]  meclizine (ANTIVERT) 25 MG tablet Take 25 mg by mouth 3 (three) times daily as needed for dizziness.   Yes [provider]  metoprolol (LOPRESSOR) 50 MG tablet Take 50 mg by mouth 2 (two) times daily.   Yes [provider]  mirtazapine (REMERON) 15 MG tablet Take 15 mg by mouth at bedtime.   Yes [provider]  Omega-3 Fatty Acids (FISH OIL) 1000 MG CAPS Take 2,000 mg by mouth daily.    Yes [provider]  Probiotic Product (PROBIOTIC-10 PO) Take 1 capsule by mouth in the morning and at bedtime.   Yes [provider]  sertraline (ZOLOFT) 100  MG tablet Take 100 mg by mouth daily.    Yes [provider]  acetaminophen (TYLENOL) 500 MG tablet Take 2 tablets (1,000 mg total) by mouth 3 (three) times daily. X 3 DAYS Patient not taking: Reported on 01/18/2020 10/20/16   Rai, Vernelle Emerald, MD  amLODipine (NORVASC) 5 MG tablet Take 1 tablet (5 mg total) by mouth daily. Patient not taking: Reported on 01/18/2020 12/12/18   Edwin Dada, MD  benzonatate (TESSALON) 100 MG capsule Take 1 capsule (100 mg total) by mouth 3 (three) times daily as needed for cough. Patient not taking: Reported on 01/18/2020 12/09/18   Eugenie Filler, MD  bisacodyl (DULCOLAX) 10  MG suppository Place 1 suppository (10 mg total) rectally daily as needed for moderate constipation. Patient not taking: Reported on 11/28/2018 10/20/16   Rai, Vernelle Emerald, MD  feeding supplement, ENSURE ENLIVE, (ENSURE ENLIVE) LIQD Take 237 mLs by mouth 2 (two) times daily between meals. Patient not taking: Reported on 01/18/2020 10/20/16   Rai, Vernelle Emerald, MD  hydrALAZINE (APRESOLINE) 25 MG tablet Take 2 tablets (50 mg total) by mouth 2 (two) times a day. Patient not taking: Reported on 01/18/2020 12/11/18   Edwin Dada, MD  nitroGLYCERIN (NITROSTAT) 0.4 MG SL tablet Place 1 tablet (0.4 mg total) under the tongue every 5 (five) minutes as needed for chest pain. Patient not taking: Reported on 01/18/2020 12/11/18   Edwin Dada, MD  pantoprazole (PROTONIX) 40 MG tablet Take 1 tablet (40 mg total) by mouth 2 (two) times daily before a meal. Take 2 times daily for 2 weeks, then 1 tablet daily/ Patient not taking: Reported on 01/18/2020 12/09/18   Eugenie Filler, MD    Physical Exam: Constitutional: Moderately built and nourished. Vitals:   01/19/20 0015 01/19/20 0030 01/19/20 0053 01/19/20 0116  BP: (!) 130/56 (!) 143/47  (!) 168/45  Pulse: (!) 55 (!) 54  (!) 57  Resp: 16 15  17   Temp:   97.8 F (36.6 C) 97.6 F (36.4 C)  TempSrc:   Oral   SpO2: 97% 97%  98%    Eyes: Anicteric no pallor. ENMT: No discharge from the ears eyes nose or mouth. Neck: No mass felt.  No neck rigidity. Respiratory: No rhonchi or crepitations. Cardiovascular: S1-S2 heard. Abdomen: Soft nontender bowel sounds present. Musculoskeletal: No edema. Skin: No rash. Neurologic: Alert awake oriented to person and place moves all extremities. Psychiatric: Oriented to person and place.   Labs on Admission: I have personally reviewed following labs and imaging studies  CBC: Recent Labs  Lab 01/18/20 2232 01/19/20 0015  WBC 5.9 6.1  NEUTROABS 3.7  --   HGB 11.4* 10.4*  HCT 35.2* 31.8*  MCV 92.6 94.6  PLT 202 [id number]   Basic Metabolic Panel: Recent Labs  Lab 01/18/20 2232  NA 141  K 4.2  CL 112*  CO2 22  GLUCOSE 113*  BUN 28*  CREATININE 1.04*  CALCIUM 9.8   GFR: CrCl cannot be calculated (Unknown ideal weight.). Liver Function Tests: Recent Labs  Lab 01/18/20 2232  AST 16  ALT 10  ALKPHOS 86  BILITOT 0.6  PROT 6.5  ALBUMIN 3.5   No results for input(s): LIPASE, AMYLASE in the last 168 hours. No results for input(s): AMMONIA in the last 168 hours. Coagulation Profile: Recent Labs  Lab 01/18/20 2232  INR 1.2   Cardiac Enzymes: No results for input(s): CKTOTAL, CKMB, CKMBINDEX, TROPONINI in the last 168 hours. BNP (last 3 results) No results for input(s): PROBNP in the last 8760 hours. HbA1C: No results for input(s): HGBA1C in the last 72 hours. CBG: No results for input(s): GLUCAP in the last 168 hours. Lipid Profile: No results for input(s): CHOL, HDL, LDLCALC, TRIG, CHOLHDL, LDLDIRECT in the last 72 hours. Thyroid Function Tests: No results for input(s): TSH, T4TOTAL, FREET4, T3FREE, THYROIDAB in the last 72 hours. Anemia Panel: No results for input(s): VITAMINB12, FOLATE, FERRITIN, TIBC, IRON, RETICCTPCT in the last 72 hours. Urine analysis:    Component Value Date/Time   COLORURINE AMBER (A) 11/30/2018 1527   APPEARANCEUR CLOUDY (A)  11/30/2018 1527   LABSPEC 1.013 11/30/2018 1527   PHURINE 7.0 11/30/2018  [place] 11/30/2018 1527   HGBUR MODERATE (A) 11/30/2018 1527   BILIRUBINUR NEGATIVE 11/30/2018 1527   KETONESUR NEGATIVE 11/30/2018 1527   PROTEINUR 30 (A) 11/30/2018 1527   UROBILINOGEN 0.2 08/22/2013 0825   NITRITE NEGATIVE 11/30/2018 1527   LEUKOCYTESUR LARGE (A) 11/30/2018 1527   Sepsis Labs: @LABRCNTIP (procalcitonin:4,lacticidven:4) ) Recent Results (from the past 240 hour(s))  SARS Coronavirus 2 by RT PCR (hospital order, performed in [facility] hospital lab) Nasopharyngeal Nasopharyngeal Swab     Status: None   Collection Time: 01/18/20 10:47 PM   Specimen: Nasopharyngeal Swab  Result Value Ref Range Status   SARS Coronavirus 2 NEGATIVE NEGATIVE Final    Comment: (NOTE) SARS-CoV-2 target nucleic acids are NOT DETECTED. The SARS-CoV-2 RNA is generally detectable in upper and lower respiratory specimens during the acute phase of infection. The lowest concentration of SARS-CoV-2 viral copies this assay can detect is 250 copies / mL. A negative result does not preclude SARS-CoV-2 infection and should not be used as the sole basis for treatment or other patient management decisions.  A negative result may occur with improper specimen collection / handling, submission of specimen other than nasopharyngeal swab, presence of viral mutation(s) within the areas targeted by this assay, and inadequate number of viral copies (<250 copies / mL). A negative result must be combined with clinical observations, patient history, and epidemiological information. Fact Sheet for Patients:   StrictlyIdeas.no Fact Sheet for Healthcare Providers: BankingDealers.co.za This test is not yet approved or cleared  by the [place] FDA and has been authorized for detection and/or diagnosis of SARS-CoV-2 by FDA under an Emergency Use Authorization (EUA).  This EUA  will remain in effect (meaning this test can be used) for the duration of the COVID-19 declaration under Section 564(b)(1) of the Act, 21 U.S.C. section 360bbb-3(b)(1), unless the authorization is terminated or revoked sooner. Performed at [facility], [place] [address], [place], [place] [address]      Radiological Exams on Admission: DG Chest Port 1 View  Result Date: 01/18/2020 CLINICAL DATA:  Weakness.  GI bleed. EXAM: PORTABLE CHEST 1 VIEW COMPARISON:  12/06/2018 FINDINGS: Upper normal heart size with unchanged mediastinal contours. Aortic atherosclerosis. Left lung base atelectasis or scarring. Right lung is clear. No pneumothorax, pulmonary edema, or significant pleural effusion. Interval healing of left proximal humerus fracture from initial injury radiographs January 2021. IMPRESSION: Left lung base atelectasis or scarring. Aortic Atherosclerosis (ICD10-I70.0). Electronically Signed   By: Keith Rake M.D.   On: 01/18/2020 23:05    Assessment/Plan Principal Problem:   Acute GI bleeding Active Problems:   Essential hypertension   Hypothyroidism, acquired   Acute blood loss anemia    1. Acute GI bleeding -likely could be lower GI bleed given that patient had frank rectal bleeding.  We will keep patient n.p.o. except medication and check serial CBC.  At this time ER physician discussed with patient's daughter who at this time is requested transfusion if required but not expecting any intervention like colonoscopy or EGD.  I am holding off patient's Plavix for now. 2. Hypertension we will continue Cardizem amlodipine metoprolol on hold of lisinopril since patient had acute GI bleed will need to monitor creatinine closely given history of CKD.  As needed IV hydralazine. 3. Chronic kidney disease stage III creatinine appears to be at baseline.  Holding of lisinopril for now. 4. Hypothyroidism on Synthroid. 5. Acute blood loss anemia follow CBC. 6. History of peripheral  vascular disease  holding of Plavix for now.   DVT prophylaxis: SCDs. Code Status: DNR. Family Communication: ER physician discussed with patient's daughter. Disposition Plan: Back to facility when stable. Consults called: None. Admission status: Observation.   Rise Patience MD [facility] Pager [phone].  If 7PM-7AM, please contact night-coverage [email or web address] Password [email or web address]  01/19/2020, 1:30 AM

## 2020-01-19 NOTE — Plan of Care (Signed)
  Problem: Education: Goal: Knowledge of General Education information will improve Description Including pain rating scale, medication(s)/side effects and non-pharmacologic comfort measures Outcome: Progressing   

## 2020-01-19 NOTE — Progress Notes (Signed)
PROGRESS NOTE    [name]  [id number]  DOB: 06/24/1921  PCP: Marissa Low, MD Admit date:01/18/2020 86 y.o.femalewithhistory of hypertension chronic kidney disease stage III, hypothyroidism and TIA/peripheral vascular disease on Plavix was found to have a large amount of rectal bleeding (described as jell like clots) at the nursing facility and brought to the ER. Per report patient did not have any abdominal pain, nausea, vomiting, fever or chills. Patient is a poor historian but denies abdominal pain. ED Course: Afebrile,hemodynamically stable hemoglobin at11.4 initially which is close to her baseline (9-10.8 in 4.2020). Given history suggesting large bloody BM, after discussing with patient's daughter, she was admitted for further observation. Patient's daughter is agreeable to transfusion but does not want any active measures like endoscopy as discussed with the ER physician. Covid test is negative. Blood pressure was found to be elevated will closely monitor. Creatinine is around 1.04 which is her baseline. Hospital course: Patient admitted to [facility] for conservative management. ER physician discussed with patient's daughter who is agreeable for blood transfusion if required but does not want any intervention like colonoscopy or EGD. Thus, holding off patient's Plavix and supportive management (NPO, D51/2NS @75ML /HR)  with serial labs to monitor. Hgb appears to have downtrended since admission 11.4->10.4->9.6->9.2->8.9.   Subjective:  Patient resting comfortably. Denies any abdominal pain but states her stools are always black due to iron pills that she takes. Daughter [name] was here earlier but stepped out to get lunch. She feels ready to try diet. She reports prior diagnosis of hemorrhoids and wonders if she could have had hemorrhoidal bleeding.  Objective: Vitals:   01/19/20 0053 01/19/20 0116 01/19/20 0422 01/19/20 1224  BP:  (!) 168/45 (!) 124/49 (!) 150/48  Pulse:   (!) 57 (!) 56 (!) 55  Resp:  17 16 18   Temp: 97.8 F (36.6 C) 97.6 F (36.4 C) 97.6 F (36.4 C) 97.7 F (36.5 C)  TempSrc: Oral  Oral Axillary  SpO2:  98% 98%     Intake/Output Summary (Last 24 hours) at 01/19/2020 1600 Last data filed at 01/19/2020 0422 Gross per 24 hour  Intake 184.81 ml  Output --  Net 184.81 ml   There were no vitals filed for this visit.  Physical Examination:  General exam: Appears calm and comfortable  Respiratory system: Clear to auscultation. Respiratory effort normal. Cardiovascular system: S1 & S2 heard, RRR. No JVD, murmurs, rubs, gallops or clicks. No pedal edema. Gastrointestinal system: Abdomen is nondistended, soft and nontender. Normal bowel sounds heard. Central nervous system: Alert and oriented. No new focal neurological deficits. Extremities: No contractures, edema or joint deformities.  Skin: No rashes, lesions or ulcers Psychiatry: Judgement and insight appear normal. Mood & affect appropriate.   Data Reviewed: I have personally reviewed following labs and imaging studies  CBC: Recent Labs  Lab 01/18/20 2232 01/19/20 0015 01/19/20 0316 01/19/20 0748 01/19/20 1220  WBC 5.9 6.1 5.0 3.8* 3.9*  NEUTROABS 3.7  --   --   --   --   HGB 11.4* 10.4* 9.6* 9.2* 8.9*  HCT 35.2* 31.8* 29.7* 29.0* 28.4*  MCV 92.6 94.6 93.7 94.2 95.0  PLT 202 192 181 147* [id number]   Basic Metabolic Panel: Recent Labs  Lab 01/18/20 2232  NA 141  K 4.2  CL 112*  CO2 22  GLUCOSE 113*  BUN 28*  CREATININE 1.04*  CALCIUM 9.8   GFR: CrCl cannot be calculated (Unknown ideal weight.). Liver Function Tests: Recent Labs  Lab 01/18/20 2232  AST 16  ALT 10  ALKPHOS 86  BILITOT 0.6  PROT 6.5  ALBUMIN 3.5   No results for input(s): LIPASE, AMYLASE in the last 168 hours. No results for input(s): AMMONIA in the last 168 hours. Coagulation Profile: Recent Labs  Lab 01/18/20 2232  INR 1.2   Cardiac Enzymes: No results for input(s): CKTOTAL, CKMB,  CKMBINDEX, TROPONINI in the last 168 hours. BNP (last 3 results) No results for input(s): PROBNP in the last 8760 hours. HbA1C: No results for input(s): HGBA1C in the last 72 hours. CBG: Recent Labs  Lab 01/19/20 0544 01/19/20 1202  GLUCAP 98 104*   Lipid Profile: No results for input(s): CHOL, HDL, LDLCALC, TRIG, CHOLHDL, LDLDIRECT in the last 72 hours. Thyroid Function Tests: No results for input(s): TSH, T4TOTAL, FREET4, T3FREE, THYROIDAB in the last 72 hours. Anemia Panel: No results for input(s): VITAMINB12, FOLATE, FERRITIN, TIBC, IRON, RETICCTPCT in the last 72 hours. Sepsis Labs: No results for input(s): PROCALCITON, LATICACIDVEN in the last 168 hours.  Recent Results (from the past 240 hour(s))  SARS Coronavirus 2 by RT PCR (hospital order, performed in [facility] hospital lab) Nasopharyngeal Nasopharyngeal Swab     Status: None   Collection Time: 01/18/20 10:47 PM   Specimen: Nasopharyngeal Swab  Result Value Ref Range Status   SARS Coronavirus 2 NEGATIVE NEGATIVE Final    Comment: (NOTE) SARS-CoV-2 target nucleic acids are NOT DETECTED. The SARS-CoV-2 RNA is generally detectable in upper and lower respiratory specimens during the acute phase of infection. The lowest concentration of SARS-CoV-2 viral copies this assay can detect is 250 copies / mL. A negative result does not preclude SARS-CoV-2 infection and should not be used as the sole basis for treatment or other patient management decisions.  A negative result may occur with improper specimen collection / handling, submission of specimen other than nasopharyngeal swab, presence of viral mutation(s) within the areas targeted by this assay, and inadequate number of viral copies (<250 copies / mL). A negative result must be combined with clinical observations, patient history, and epidemiological information. Fact Sheet for Patients:   StrictlyIdeas.no Fact Sheet for Healthcare  Providers: BankingDealers.co.za This test is not yet approved or cleared  by the [place] FDA and has been authorized for detection and/or diagnosis of SARS-CoV-2 by FDA under an Emergency Use Authorization (EUA).  This EUA will remain in effect (meaning this test can be used) for the duration of the COVID-19 declaration under Section 564(b)(1) of the Act, 21 U.S.C. section 360bbb-3(b)(1), unless the authorization is terminated or revoked sooner. Performed at [facility], [place] [address] [place], [place] [address]       Radiology Studies: DG Chest Port 1 View  Result Date: 01/18/2020 CLINICAL DATA:  Weakness.  GI bleed. EXAM: PORTABLE CHEST 1 VIEW COMPARISON:  12/06/2018 FINDINGS: Upper normal heart size with unchanged mediastinal contours. Aortic atherosclerosis. Left lung base atelectasis or scarring. Right lung is clear. No pneumothorax, pulmonary edema, or significant pleural effusion. Interval healing of left proximal humerus fracture from initial injury radiographs January 2021. IMPRESSION: Left lung base atelectasis or scarring. Aortic Atherosclerosis (ICD10-I70.0). Electronically Signed   By: Keith Rake M.D.   On: 01/18/2020 23:05        Scheduled Meds: . brimonidine  1 drop Left Eye TID  . diltiazem  120 mg Oral Daily  . dorzolamide-timolol  1 drop Left Eye BID  . famotidine  20 mg Oral Daily  . latanoprost  1 drop Both Eyes  QHS  . levothyroxine  25 mcg Oral Q48H  . [START ON 01/20/2020] levothyroxine  50 mcg Oral Q48H  . metoprolol tartrate  50 mg Oral BID  . mirtazapine  15 mg Oral QHS  . pantoprazole (PROTONIX) IV  40 mg Intravenous Q24H  . sertraline  100 mg Oral Daily   Continuous Infusions: . dextrose 5 % and 0.9% NaCl 75 mL/hr at 01/19/20 1512     1. GI bleed with acute on chronic blood loss anemia: likely lower GI bleed as descsribes BRBPR and has h/o diverticulosis/hemorrhoids. She also reports black stools -chronic in  the setting of taking Fe pills. Per daughter patient never had EGD and never been told to have PUD but she she did screening colonoscopy few years back through /[facility] and told to have diverticulosis /hemorrhoids. Patient apparently prescribe plavix for recurrent TIA on ASA. Hold antiplatelet agents for now. Dropping Hgb noted--could be delayed effect vs dilutional vs continued GI losses. Noted that patient was referred to [facility] in 11/2019 for malena/anemia --was advised conservative mx by Dr Mallie Mussel given advanced age/goals of care and felt not to be a candidate for endoscopies (see their phone consult note). Daughter also prefers to avoid invasive therapies. In such a scenario, will monitor patient off fluids, ensure stable hgb , continue IV PPI for another day and advnace diet slowly. CLD today. Patient and family agreeable with this plan.Hold Fe pills and see if stools clear up.  2. Recurrent TIA: Patient apparently prescribe plavix for recurrent TIA on ASA. Hold antiplatelet agents for now given recent gross GIB, dropping Hgb. Family understands risks and benefits. Can probably resume EC 81mg  upon discharge given care goals and risks involved.  3. Hypertension : resume home meds as BP tolerates in the setting of GIB  4. Chronic kidney disease stage III: stable, monitor  5. H/o PSVT: on cardizem, metoprolol at home  6. Hypothyroidism: synthroid  7. Depression/anxiety: resume home meds.   8. Goals of care: Patient apparently was on home hospice at one point then graduated as was doing well. She has been at [facility] for 6 months now, has not see palliative care team there. Family leaning towards non aggressive care which is appropriate for her age and understand risks/benefits involved with current care plan. She is DNR.    DVT prophylaxis: SCD Code Status: DNR Family / Patient Communication: d/w daughters [name] and [name] over the phone .  Disposition Plan:   Status is:  Observation  The patient will require care spanning > 2 midnights and should be moved to inpatient because: Ongoing diagnostic testing needed not appropriate for outpatient work up-declining/unstable hgb levels with GIB   Dispo: The patient is from: SNF  Anticipated d/c is to: SNF  Anticipated d/c date is: 1 day  Patient currently is not medically stable to d/c.    LOS: 0 days    Time spent: 35 minutes    Guilford Shi, MD [facility] Pager in [place]  If 7PM-7AM, please contact night-coverage [email or web address] 01/19/2020, 4:00 PM

## 2020-01-20 DIAGNOSIS — E039 Hypothyroidism, unspecified: Secondary | ICD-10-CM

## 2020-01-20 DIAGNOSIS — G459 Transient cerebral ischemic attack, unspecified: Secondary | ICD-10-CM

## 2020-01-20 LAB — CBC
HCT: 31.3 % — ABNORMAL LOW (ref 36.0–46.0)
Hemoglobin: 10.1 g/dL — ABNORMAL LOW (ref 12.0–15.0)
MCH: 31.1 pg (ref 26.0–34.0)
MCHC: 32.3 g/dL (ref 30.0–36.0)
MCV: 96.3 fL (ref 80.0–100.0)
Platelets: 164 10*3/uL (ref 150–400)
RBC: 3.25 MIL/uL — ABNORMAL LOW (ref 3.87–5.11)
RDW: 14.5 % (ref 11.5–15.5)
WBC: 4.1 10*3/uL (ref 4.0–10.5)
nRBC: 0 % (ref 0.0–0.2)

## 2020-01-20 LAB — HEMOGLOBIN AND HEMATOCRIT, BLOOD
HCT: 26.1 % — ABNORMAL LOW (ref 36.0–46.0)
HCT: 30.7 % — ABNORMAL LOW (ref 36.0–46.0)
Hemoglobin: 8.4 g/dL — ABNORMAL LOW (ref 12.0–15.0)
Hemoglobin: 10 g/dL — ABNORMAL LOW (ref 12.0–15.0)

## 2020-01-20 LAB — GLUCOSE, CAPILLARY
Glucose-Capillary: 84 mg/dL (ref 70–99)
Glucose-Capillary: 94 mg/dL (ref 70–99)

## 2020-01-20 MED ORDER — PANTOPRAZOLE SODIUM 40 MG PO TBEC: 40.0000 mg | DELAYED_RELEASE_TABLET | Freq: Two times a day (BID) | ORAL | 0 refills | Status: DC

## 2020-01-20 MED ORDER — ASPIRIN EC 81 MG PO TBEC
81.0000 mg | DELAYED_RELEASE_TABLET | Freq: Every day | ORAL | 3 refills | Status: AC
Start: 2020-01-20 — End: 2021-01-19

## 2020-01-20 NOTE — Plan of Care (Signed)

## 2020-01-20 NOTE — TOC Initial Note (Signed)
Transition of Care [facility]) - Initial/Assessment Note    Patient Details  Name: [name] MRN: [id number] Date of Birth: Apr 08, 1921  Transition of Care [facility]) CM/SW Contact:    Alexander Mt, LCSW Phone Number: 01/20/2020, 1:29 PM  Clinical Narrative:                 CSW met with pt and pt daughter [name] at bedside. Introduced self, role, reason for visit. Pt from [facility]. She has been there about 4 weeks and plan is for her to return when ready. Pt daughter and pt unsure if d/c is today or not. CSW let pt/pt daughter know I would ask MD.   CSW messaged Dr. Earnest Conroy and inquired about medical readiness. Benson aware pt [facility] at [facility] and following for possible return today.   Expected Discharge Plan: [place] Barriers to Discharge: Continued Medical Work up   Patient Goals and CMS Choice Patient states their goals for this hospitalization and ongoing recovery are:: return to [employer].gov Compare Post Acute Care list provided to:: Patient(resident at [facility]) Choice offered to / list presented to : Patient  Expected Discharge Plan and Services Expected Discharge Plan: [place] In-house Referral: Clinical Social Work Discharge Planning Services: CM Consult Post Acute Care Choice: Resumption of Product/process development scientist, [place] Living arrangements for the past 2 months: [place], [place]  Prior Living Arrangements/Services Living arrangements for the past 2 months: [place], [place] Lives with:: Facility Resident Patient language and need for interpreter reviewed:: Yes(no needs) Do you feel safe going back to the place where you live?: Yes      Need for Family Participation in Patient Care: Yes (Comment)(assistance w/ daily cares; support) Care giver support system in place?: Yes (comment)(facility staff, daughters, [facility] palliative)    Criminal Activity/Legal Involvement Pertinent to Current Situation/Hospitalization: No - Comment as needed  Permission Sought/Granted Permission sought to share information with : Family Supports, Chartered certified accountant granted to share information with : Yes, Verbal Permission Granted  Share Information with NAME: [name]  Permission granted to share info w AGENCY: [place] granted to share info w Relationship: daughter  Permission granted to share info w Contact Information: [phone]  Emotional Assessment Appearance:: Appears stated age Attitude/Demeanor/Rapport: Engaged, Gracious Affect (typically observed): Accepting, Adaptable, Appropriate, Calm Orientation: : Oriented to Self, Oriented to Place, Oriented to  Time, Oriented to Situation Alcohol / Substance Use: Not Applicable Psych Involvement: No (comment)  Admission diagnosis:  Weakness [R53.1] Acute GI bleeding [K92.2] GI bleed [K92.2] Patient Active Problem List   Diagnosis Date Noted  . Acute blood loss anemia 01/19/2020  . GI bleed 01/19/2020  . Acute GI bleeding 01/18/2020  . E. coli UTI 12/04/2018  . UTI due to Klebsiella species 12/04/2018  . Abnormal magnetic resonance cholangiopancreatography (MRCP) 12/04/2018  . Hypophosphatemia 12/04/2018  . Hypokalemia 12/04/2018  . Proctitis 12/04/2018  . Hypomagnesemia 12/04/2018  . AKI (acute kidney injury) ([place]) 11/28/2018  . Viral gastroenteritis 11/28/2018  . Generalized weakness 11/28/2018  . Chronic anemia 11/28/2018  . Thrombocytopenia ([place]) 11/28/2018  . Chronic bilateral low back pain 10/31/2016  . Closed subcapital fracture of femur, left, initial encounter ([place]) 10/16/2016  . Peripheral vascular disease ([place]) 10/16/2016  . Essential hypertension 10/16/2016  . Hypothyroidism, acquired 10/16/2016  . Dry eye 04/21/2015  . H/O surgical procedure 05/25/2014  . 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  Pseudoaphakia  04/10/2014  . Acute appendicitis 08/22/2013   PCP:  Wenda Low, MD Pharmacy:  No Pharmacies Listed   Readmission Risk Interventions Readmission Risk Prevention Plan 01/20/2020  Transportation Screening Complete  PCP or Specialist Appt within 5-7 Days Not Complete  Not Complete comments SNF resident  Home Care Screening Not Complete  Home Care Screening Not Completed Comments SNF resident  Medication Review (RN CM) Referral to Pharmacy  Some recent data might be hidden

## 2020-01-20 NOTE — Plan of Care (Signed)
  Problem: Coping: Goal: Level of anxiety will decrease 01/20/2020 1716 by Shanon Ace, RN Outcome: Adequate for Discharge 01/20/2020 1709 by Shanon Ace, RN Outcome: Progressing   Problem: Elimination: Goal: Will not experience complications related to bowel motility 01/20/2020 1716 by Shanon Ace, RN Outcome: Adequate for Discharge 01/20/2020 1709 by Shanon Ace, RN Outcome: Progressing Goal: Will not experience complications related to urinary retention 01/20/2020 1716 by Shanon Ace, RN Outcome: Adequate for Discharge 01/20/2020 1709 by Shanon Ace, RN Outcome: Progressing   Problem: Pain Managment: Goal: General experience of comfort will improve 01/20/2020 1716 by Shanon Ace, RN Outcome: Adequate for Discharge 01/20/2020 1709 by Shanon Ace, RN Outcome: Progressing   Problem: Safety: Goal: Ability to remain free from injury will improve 01/20/2020 1716 by Shanon Ace, RN Outcome: Adequate for Discharge 01/20/2020 1709 by Shanon Ace, RN Outcome: Progressing   Problem: Pain Managment: Goal: General experience of comfort will improve 01/20/2020 1716 by Shanon Ace, RN Outcome: Adequate for Discharge 01/20/2020 1709 by Shanon Ace, RN Outcome: Progressing   Problem: Safety: Goal: Ability to remain free from injury will improve 01/20/2020 1716 by Shanon Ace, RN Outcome: Adequate for Discharge 01/20/2020 1709 by Shanon Ace, RN Outcome: Progressing   Problem: Skin Integrity: Goal: Risk for impaired skin integrity will decrease 01/20/2020 1716 by Shanon Ace, RN Outcome: Adequate for Discharge 01/20/2020 1709 by Shanon Ace, RN Outcome: Progressing

## 2020-01-20 NOTE — TOC Transition Note (Signed)
Transition of Care [facility]) - CM/SW Discharge Note   Patient Details  Name: [name] MRN: [id number] Date of Birth: 1921/03/17  Transition of Care [facility]) CM/SW Contact:  Alexander Mt, LCSW Phone Number: 01/20/2020, 4:36 PM   Clinical Narrative:    Spoke with pt daughter [name], all paperwork sent to [facility]. DNR on chart with [facility] papers. [facility] called for 5pm, RN to call report. Pt family aware there may be a bit of a delay.    Final next level of care: [place] Barriers to Discharge: Barriers Resolved   Patient Goals and CMS Choice Patient states their goals for this hospitalization and ongoing recovery are:: return to [email or web address].gov Compare Post Acute Care list provided to:: Patient(resident at [facility]) Choice offered to / list presented to : Patient  Discharge Placement   Existing PASRR number confirmed : 01/20/20          Patient [facility] bed at: [facility] Patient to be transferred to facility by: [place] Name of family member notified: pt daughter [name] Patient and family notified of of transfer: 01/20/20  Discharge Plan and Services In-house Referral: Clinical Social Work Discharge Planning Services: CM Consult Post Acute Care Choice: Resumption of Svcs/PTA Provider, [place]            Readmission Risk Interventions Readmission Risk Prevention Plan 01/20/2020  Transportation Screening Complete  PCP or Specialist Appt within 5-7 Days Not Complete  Not Complete comments SNF resident  Home Care Screening Not Complete  Home Care Screening Not Completed Comments SNF resident  Medication Review (RN CM) Referral to Pharmacy  Some recent data might be hidden

## 2020-01-20 NOTE — Social Work (Signed)
Clinical Social Worker facilitated patient discharge including contacting patient family and facility to confirm patient discharge plans.  Clinical information faxed to facility and family agreeable with plan.  CSW arranged ambulance transport via [facility] to [place] RN to call [phone]  with report prior to discharge.  Clinical Social Worker will sign off for now as social work intervention is no longer needed. Please consult us again if new need arises.  Jadin Creque, MSW, LCSW Clinical Social Worker   

## 2020-01-20 NOTE — NC FL2 (Signed)
[place] LEVEL OF CARE SCREENING TOOL     IDENTIFICATION  Patient Name: [name] Birthdate: [birth year] Sex: female Admission Date (Current Location): 01/18/2020  [facility] [place] Number:  Herbalist and Address:  [facility] [place], [place] [address], [place], [place] [address]      Provider Number: [id number]  Attending Physician Name and Address:  Guilford Shi, MD  Relative Name and Phone Number:       Current Level of Care: Hospital Recommended Level of Care: [place] Prior Approval Number:    Date Approved/Denied:   PASRR Number: [id number]  Discharge Plan: SNF    Current Diagnoses: Patient Active Problem List   Diagnosis Date Noted  . Acute blood loss anemia 01/19/2020  . GI bleed 01/19/2020  . Acute GI bleeding 01/18/2020  . E. coli UTI 12/04/2018  . UTI due to Klebsiella species 12/04/2018  . Abnormal magnetic resonance cholangiopancreatography (MRCP) 12/04/2018  . Hypophosphatemia 12/04/2018  . Hypokalemia 12/04/2018  . Proctitis 12/04/2018  . Hypomagnesemia 12/04/2018  . AKI (acute kidney injury) ([place]) 11/28/2018  . Viral gastroenteritis 11/28/2018  . Generalized weakness 11/28/2018  . Chronic anemia 11/28/2018  . Thrombocytopenia ([place]) 11/28/2018  . Chronic bilateral low back pain 10/31/2016  . Closed subcapital fracture of femur, left, initial encounter ([place]) 10/16/2016  . Peripheral vascular disease ([place]) 10/16/2016  . Essential hypertension 10/16/2016  . Hypothyroidism, acquired 10/16/2016  . Dry eye 04/21/2015  . H/O surgical procedure 05/25/2014  . 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 Pseudoaphakia 04/10/2014  . Acute appendicitis 08/22/2013    Orientation RESPIRATION BLADDER Height & Weight     Self, Time, Situation, Place  Normal Incontinent Weight: 110 lb 7.2 oz (50.1 kg) Height:     BEHAVIORAL SYMPTOMS/MOOD NEUROLOGICAL BOWEL NUTRITION STATUS   Continent Diet(see discharge summary)  AMBULATORY STATUS COMMUNICATION OF NEEDS Skin   Extensive Assist Verbally Normal, Other (Comment)(generalized ecchymosis; dry)                       Personal Care Assistance Level of Assistance  Bathing, Feeding, Dressing Bathing Assistance: Maximum assistance Feeding assistance: Independent Dressing Assistance: Maximum assistance     Functional Limitations Info  Sight, Hearing, Speech Sight Info: Adequate Hearing Info: Adequate Speech Info: Adequate    SPECIAL CARE FACTORS FREQUENCY                       Contractures Contractures Info: Not present    Additional Factors Info  Code Status, Allergies, Psychotropic Code Status Info: DNR Allergies Info: Codeine, Gabapentin, Tramadol, Other Psychotropic Info: mirtazapine (REMERON) tablet 15 mg daily at bedtime; sertraline (ZOLOFT) tablet 100 mg daily PO         Current Medications (01/20/2020):  This is the current hospital active medication list Current Facility-Administered Medications  Medication Dose Route Frequency Provider Last Rate Last Admin  . acetaminophen (TYLENOL) tablet 650 mg  650 mg Oral Q6H PRN Guilford Shi, MD   650 mg at 01/20/20 1048  . brimonidine (ALPHAGAN) 0.15 % ophthalmic solution 1 drop  1 drop Left Eye TID Rise Patience, MD   1 drop at 01/20/20 0956  . diltiazem (CARDIZEM CD) 24 hr capsule 120 mg  120 mg Oral Daily Rise Patience, MD   120 mg at 01/20/20 0950  . dorzolamide-timolol (COSOPT) 22.3-6.8 MG/ML ophthalmic solution 1 drop  1 drop Left Eye BID Rise Patience, MD  1 drop at 01/20/20 0958  . famotidine (PEPCID) tablet 20 mg  20 mg Oral Daily Rise Patience, MD   20 mg at 01/20/20 0951  . hydrALAZINE (APRESOLINE) injection 10 mg  10 mg Intravenous Q4H PRN Rise Patience, MD      . latanoprost (XALATAN) 0.005 % ophthalmic solution 1 drop  1 drop Both Eyes QHS Rise Patience, MD   1 drop at 01/19/20 2100  .  levothyroxine (SYNTHROID) tablet 25 mcg  25 mcg Oral Q48H Rise Patience, MD   25 mcg at 01/19/20 0516  . levothyroxine (SYNTHROID) tablet 50 mcg  50 mcg Oral Q48H Rise Patience, MD   50 mcg at 01/20/20 [id number]  . metoprolol tartrate (LOPRESSOR) tablet 50 mg  50 mg Oral BID Rise Patience, MD   50 mg at 01/20/20 0950  . mirtazapine (REMERON) tablet 15 mg  15 mg Oral QHS Rise Patience, MD   15 mg at 01/19/20 2100  . ondansetron (ZOFRAN) tablet 4 mg  4 mg Oral Q6H PRN Rise Patience, MD       Or  . ondansetron [facility]) injection 4 mg  4 mg Intravenous Q6H PRN Rise Patience, MD      . pantoprazole (PROTONIX) injection 40 mg  40 mg Intravenous Q24H Rise Patience, MD   40 mg at 01/20/20 0436  . sertraline (ZOLOFT) tablet 100 mg  100 mg Oral Daily Rise Patience, MD   100 mg at 01/20/20 [id number]     Discharge Medications: Please see discharge summary for a list of discharge medications.  Relevant Imaging Results:  Relevant Lab Results:   Additional Information [id number]  Alexander Mt, LCSW

## 2020-01-20 NOTE — Discharge Summary (Signed)
Physician Discharge Summary  [name] [id number] DOB: [birth year] DOA: 01/18/2020  PCP: Wenda Low, MD  Admit date: 01/18/2020 Discharge date: 01/20/2020 Consultations:  None Admitted From:  [facility] Disposition: back to SNF  Discharge Diagnoses:  Principal Problem:   Acute GI bleeding Active Problems:   Essential hypertension   Hypothyroidism, acquired   Acute blood loss anemia   GI bleed   Hospital Course Summary: 86 y.o.femalewithhistory of hypertension chronic kidney disease stage III, hypothyroidism and TIA/peripheral vascular disease on Plavix was found to have a large amount of rectal bleeding (described as jell like clots) at thenursingfacility and brought to the ER. Per report patient did not have any abdominal pain,nausea,vomiting,fever or chills. Patient is a poor historian butdeniesabdominal pain. ED Course: Afebrile,hemodynamically stable hemoglobinat11.4initiallywhich isclose toher baseline (9-10.8 in 4.2020). Givenhistory suggesting large bloody BM,after discussing with patient's daughter, shewas admitted for further observation. Patient's daughter is agreeable to transfusion but does not want any active measures like endoscopy as discussed with the ER physician. Covid test is negative. Blood pressure was found to be elevated will closely monitor. Creatinine is around 1.04 which isherbaseline. Hospital course: Patient [facility].ER physician discussed with patient's daughter whowas agreeable for bloodtransfusion if required but did not wantany intervention like colonoscopy or EGD. Thus,holding off patient's Plavix  Hgb appears to have downtrended since admission   1. GI bleed with acute on chronic blood loss anemia: likely lower GI bleed as descsribes BRBPR and has h/o diverticulosis/hemorrhoids. She also reports black stools -chronic in the setting of taking Fe pills. Per daughter patient  never had EGD and never been told to have PUD but she she did screening colonoscopy few years back through /[facility] and told to have diverticulosis /hemorrhoids. Patient apparently prescribed plavix for recurrent TIA on ASA. Held antiplatelet agents while here. Dropping Hgb noted -11.4->10.4->9.6->9.2->8.9->8.4 in the hospital course which could have been a delayed effect vs dilutional. Noted that patient was referred to [facility] in 11/2019 for malena/anemia --was advised conservative mx by Dr Mallie Mussel given advanced age/goals of care and felt not to be a candidate for endoscopies (see their phone consult note). Daughter also prefers to avoid invasive therapies. In such a scenario, patient given supportive management with IV hydration while here and diet advanced slowly which she has been tolerating well.  She denies any further rectal bleeding since she has been here, has some dark stools but chronic for her.  IV fluids discontinued early this morning.  Repeat hemoglobin this afternoon appears to have improved to 10 (without any blood transfusion and confirmed with repeat H&H) which indicates likely dilutional effect yesterday.  Patient advised PPI TWICE daily for 2 weeks and then daily, can likely resume baby aspirin and can follow-up with GI as outpatient if has recurrent drop in hemoglobin.   2. Recurrent TIA: Patient apparently prescribe plavix for recurrent TIA on ASA. Hold antiplatelet agents for now given recent gross GIB, dropping Hgb. Family understands risks and benefits. Can resume EC 81mg  upon discharge given care goals and risks involved.  If hemoglobin drops again on repeat labs at SNF or she has recurrent bleeding, patient should stay off all antiplatelet agents understanding the risks and benefits.  This was discussed in detail with patient as well as family.  3. Hypertension : resume home meds as BP tolerates in the setting of GIB  4. Chronic kidney disease stage III: stable, monitor  5. H/o  PSVT: on cardizem, metoprolol at baseline.  6. Hypothyroidism: synthroid  7. Depression/anxiety: resume home meds.   8. Goals of care: Patient apparently was on home hospice at one point then graduated as was doing well. She has been at [facility] for 6 months now, has not see palliative care team there. Family leaning towards non aggressive care which is appropriate for her age and understand risks/benefits involved with current care plan. She is DNR.   Discharge Exam:  Vitals:   01/20/20 1001 01/20/20 1502  BP: 129/87 (!) 168/58  Pulse:  (!) 54  Resp:  16  Temp:  97.6 F (36.4 C)  SpO2:  100%   Vitals:   01/20/20 0559 01/20/20 0953 01/20/20 1001 01/20/20 1502  BP: (!) 168/54 (!) 183/53 129/87 (!) 168/58  Pulse: (!) 53 (!) 57  (!) 54  Resp: 17   16  Temp: 97.7 F (36.5 C)   97.6 F (36.4 C)  TempSrc: Oral   Oral  SpO2: 99%   100%  Weight:        General: Pt is alert, awake, not in acute distress Cardiovascular: RRR, S1/S2 +, no rubs, no gallops Respiratory: CTA bilaterally, no wheezing, no rhonchi Abdominal: Soft, NT, ND, bowel sounds + Extremities: no edema, no cyanosis  Discharge Condition:Stable CODE STATUS: DNR Diet recommendation: Avoid gastric irritants, heart healthy diet Recommendations for Outpatient Follow-up:  1. Follow up with PCP: At SNF in 3 days 2. Follow up with consultants: Palliative care team at SNF, GI as needed (daughter thinks patient had seen [facility] in the past, according to chart review [facility] was contacted in April) 3. Please obtain follow up labs including: CBC/BMP  Home Health services upon discharge: Going to SNF Equipment/Devices upon discharge:   Discharge Instructions:  Discharge Instructions    Call MD for:  difficulty breathing, headache or visual disturbances   Complete by: As directed    Call MD for:  persistant dizziness or light-headedness   Complete by: As directed    Call MD for:  persistant nausea and  vomiting   Complete by: As directed    Diet - low sodium heart healthy   Complete by: As directed    Increase activity slowly   Complete by: As directed      Allergies as of 01/20/2020      Reactions   Codeine Other (See Comments)   "I feel like I'm floating"   Gabapentin Other (See Comments)   Cannot be on for a good length of time- causes a lot of lethargy   Tramadol Other (See Comments)   On MAR   Other Other (See Comments)   All pain meds cause lethargy and disorientation      Medication List    STOP taking these medications   amLODipine 5 MG tablet Commonly known as: NORVASC   bisacodyl 10 MG suppository Commonly known as: DULCOLAX   clopidogrel 75 MG tablet Commonly known as: PLAVIX   hydrALAZINE 25 MG tablet Commonly known as: APRESOLINE     TAKE these medications   acetaminophen 325 MG tablet Commonly known as: TYLENOL Take 650 mg by mouth in the morning, at noon, in the evening, and at bedtime. What changed: Another medication with the same name was removed. Continue taking this medication, and follow the directions you see here.   ascorbic acid 500 MG tablet Commonly known as: VITAMIN C Take 500 mg by mouth daily.   Aspercreme Lidocaine 4 % Crea Generic drug: Lidocaine HCl Apply topically in the  morning and at bedtime. To affected area of mid-back   aspirin EC 81 MG tablet Take 1 tablet (81 mg total) by mouth daily.   benzonatate 100 MG capsule Commonly known as: TESSALON Take 1 capsule (100 mg total) by mouth 3 (three) times daily as needed for cough.   bimatoprost 0.01 % Soln Commonly known as: LUMIGAN Place 1 drop into both eyes at bedtime.   brimonidine 0.15 % ophthalmic solution Commonly known as: ALPHAGAN Place 1 drop into the left eye 3 (three) times daily.   Calcium 600-200 MG-UNIT tablet Take 1 tablet by mouth daily.   cloNIDine 0.1 MG tablet Commonly known as: CATAPRES Take 0.1 mg by mouth daily as needed (for SBP>180).    diltiazem 120 MG 24 hr capsule Commonly known as: CARDIZEM CD Take 120 mg by mouth daily.   dorzolamide-timolol 22.3-6.8 MG/ML ophthalmic solution Commonly known as: COSOPT Place 1 drop into the left eye 2 (two) times daily.   famotidine 20 MG tablet Commonly known as: PEPCID Take 20 mg by mouth daily.   lactose free nutrition Liqd Take 237 mLs by mouth daily.   feeding supplement (ENSURE ENLIVE) Liqd Take 237 mLs by mouth 2 (two) times daily between meals.   ferrous sulfate 325 (65 FE) MG EC tablet Take 325 mg by mouth daily with breakfast.   fexofenadine 60 MG tablet Commonly known as: ALLEGRA Take 60 mg by mouth 2 (two) times daily.   Fish Oil 1000 MG Caps Take 2,000 mg by mouth daily.   levothyroxine 50 MCG tablet Commonly known as: SYNTHROID Take 50 mcg by mouth every other day.   levothyroxine 25 MCG tablet Commonly known as: SYNTHROID Take 25 mcg by mouth every other day.   lisinopril 20 MG tablet Commonly known as: ZESTRIL Take 20 mg by mouth daily.   loperamide 2 MG tablet Commonly known as: IMODIUM A-D Take 2 mg by mouth daily as needed for diarrhea or loose stools.   meclizine 25 MG tablet Commonly known as: ANTIVERT Take 25 mg by mouth 3 (three) times daily as needed for dizziness.   metoprolol tartrate 50 MG tablet Commonly known as: LOPRESSOR Take 50 mg by mouth 2 (two) times daily.   mirtazapine 15 MG tablet Commonly known as: REMERON Take 15 mg by mouth at bedtime.   nitroGLYCERIN 0.4 MG SL tablet Commonly known as: NITROSTAT Place 1 tablet (0.4 mg total) under the tongue every 5 (five) minutes as needed for chest pain.   pantoprazole 40 MG tablet Commonly known as: PROTONIX Take 1 tablet (40 mg total) by mouth 2 (two) times daily before a meal. Take 2 times daily for 2 weeks, then 1 tablet daily/   PROBIOTIC-10 PO Take 1 capsule by mouth in the morning and at bedtime.   sertraline 100 MG tablet Commonly known as: ZOLOFT Take 100  mg by mouth daily.   Welchol 3.75 g Pack Generic drug: Colesevelam HCl Take 3.75 g by mouth daily.       Allergies  Allergen Reactions  . Codeine Other (See Comments)    "I feel like I'm floating"   . Gabapentin Other (See Comments)    Cannot be on for a good length of time- causes a lot of lethargy  . Tramadol Other (See Comments)    On MAR  . Other Other (See Comments)    All pain meds cause lethargy and disorientation      The results of significant diagnostics from this hospitalization (including imaging,  microbiology, ancillary and laboratory) are listed below for reference.    Labs: BNP (last 3 results) No results for input(s): BNP in the last 8760 hours. Basic Metabolic Panel: Recent Labs  Lab 01/18/20 2232  NA 141  K 4.2  CL 112*  CO2 22  GLUCOSE 113*  BUN 28*  CREATININE 1.04*  CALCIUM 9.8   Liver Function Tests: Recent Labs  Lab 01/18/20 2232  AST 16  ALT 10  ALKPHOS 86  BILITOT 0.6  PROT 6.5  ALBUMIN 3.5   No results for input(s): LIPASE, AMYLASE in the last 168 hours. No results for input(s): AMMONIA in the last 168 hours. CBC: Recent Labs  Lab 01/18/20 2232 01/18/20 2232 01/19/20 0015 01/19/20 0015 01/19/20 0316 01/19/20 0316 01/19/20 0748 01/19/20 0748 01/19/20 1220 01/19/20 1711 01/20/20 0222 01/20/20 0904 01/20/20 1405  WBC 5.9   < > 6.1  --  5.0  --  3.8*  --  3.9*  --   --  4.1  --   NEUTROABS 3.7  --   --   --   --   --   --   --   --   --   --   --   --   HGB 11.4*   < > 10.4*   < > 9.6*   < > 9.2*   < > 8.9* 8.8* 8.4* 10.1* 10.0*  HCT 35.2*   < > 31.8*   < > 29.7*   < > 29.0*   < > 28.4* 27.4* 26.1* 31.3* 30.7*  MCV 92.6   < > 94.6  --  93.7  --  94.2  --  95.0  --   --  96.3  --   PLT 202   < > 192  --  181  --  147*  --  157  --   --  164  --    < > = values in this interval not displayed.   Cardiac Enzymes: No results for input(s): CKTOTAL, CKMB, CKMBINDEX, TROPONINI in the last 168 hours. BNP: Invalid input(s):  POCBNP CBG: Recent Labs  Lab 01/19/20 0544 01/19/20 1202 01/19/20 1712 01/20/20 0016 01/20/20 0555  GLUCAP 98 104* 100* 94 84   D-Dimer No results for input(s): DDIMER in the last 72 hours. Hgb A1c No results for input(s): HGBA1C in the last 72 hours. Lipid Profile No results for input(s): CHOL, HDL, LDLCALC, TRIG, CHOLHDL, LDLDIRECT in the last 72 hours. Thyroid function studies No results for input(s): TSH, T4TOTAL, T3FREE, THYROIDAB in the last 72 hours.  Invalid input(s): FREET3 Anemia work up Recent Labs    01/19/20 1711  FERRITIN 105  TIBC 244*  IRON 49   Urinalysis    Component Value Date/Time   COLORURINE AMBER (A) 11/30/2018 1527   APPEARANCEUR CLOUDY (A) 11/30/2018 1527   LABSPEC 1.013 11/30/2018 1527   PHURINE 7.0 11/30/2018 1527   GLUCOSEU NEGATIVE 11/30/2018 1527   HGBUR MODERATE (A) 11/30/2018 1527   BILIRUBINUR NEGATIVE 11/30/2018 1527   KETONESUR NEGATIVE 11/30/2018 1527   PROTEINUR 30 (A) 11/30/2018 1527   UROBILINOGEN 0.2 08/22/2013 0825   NITRITE NEGATIVE 11/30/2018 1527   LEUKOCYTESUR LARGE (A) 11/30/2018 1527   Sepsis Labs Invalid input(s): PROCALCITONIN,  WBC,  LACTICIDVEN Microbiology Recent Results (from the past 240 hour(s))  SARS Coronavirus 2 by RT PCR (hospital order, performed in [place] hospital lab) Nasopharyngeal Nasopharyngeal Swab     Status: None   Collection Time: 01/18/20 10:47 PM  Specimen: Nasopharyngeal Swab  Result Value Ref Range Status   SARS Coronavirus 2 NEGATIVE NEGATIVE Final    Comment: (NOTE) SARS-CoV-2 target nucleic acids are NOT DETECTED. The SARS-CoV-2 RNA is generally detectable in upper and lower respiratory specimens during the acute phase of infection. The lowest concentration of SARS-CoV-2 viral copies this assay can detect is 250 copies / mL. A negative result does not preclude SARS-CoV-2 infection and should not be used as the sole basis for treatment or other patient management decisions.  A  negative result may occur with improper specimen collection / handling, submission of specimen other than nasopharyngeal swab, presence of viral mutation(s) within the areas targeted by this assay, and inadequate number of viral copies (<250 copies / mL). A negative result must be combined with clinical observations, patient history, and epidemiological information. Fact Sheet for Patients:   StrictlyIdeas.no Fact Sheet for Healthcare Providers: BankingDealers.co.za This test is not yet approved or cleared  by the [place] FDA and has been authorized for detection and/or diagnosis of SARS-CoV-2 by FDA under an Emergency Use Authorization (EUA).  This EUA will remain in effect (meaning this test can be used) for the duration of the COVID-19 declaration under Section 564(b)(1) of the Act, 21 U.S.C. section 360bbb-3(b)(1), unless the authorization is terminated or revoked sooner. Performed at [facility], [place] [address], [place], [place] [address]     Procedures/Studies: DG Chest Port 1 View  Result Date: 01/18/2020 CLINICAL DATA:  Weakness.  GI bleed. EXAM: PORTABLE CHEST 1 VIEW COMPARISON:  12/06/2018 FINDINGS: Upper normal heart size with unchanged mediastinal contours. Aortic atherosclerosis. Left lung base atelectasis or scarring. Right lung is clear. No pneumothorax, pulmonary edema, or significant pleural effusion. Interval healing of left proximal humerus fracture from initial injury radiographs January 2021. IMPRESSION: Left lung base atelectasis or scarring. Aortic Atherosclerosis (ICD10-I70.0). Electronically Signed   By: Keith Rake M.D.   On: 01/18/2020 23:05    Time coordinating discharge: Over 30 minutes  SIGNED:   Guilford Shi, MD  [facility] 01/20/2020, 3:45 PM

## 2020-01-20 NOTE — Progress Notes (Signed)
[facility] arrived and transported Pt to [employer] daughter was called in to assist with Pt., Pt was sundowing, becoming confused and upset due to fact that [facility] was late coming.

## 2020-01-21 DIAGNOSIS — R410 Disorientation, unspecified: Secondary | ICD-10-CM | POA: Diagnosis not present

## 2020-01-21 DIAGNOSIS — K922 Gastrointestinal hemorrhage, unspecified: Secondary | ICD-10-CM | POA: Diagnosis not present

## 2020-01-21 DIAGNOSIS — Z20822 Contact with and (suspected) exposure to covid-19: Secondary | ICD-10-CM | POA: Diagnosis not present

## 2020-01-22 DIAGNOSIS — K922 Gastrointestinal hemorrhage, unspecified: Secondary | ICD-10-CM | POA: Diagnosis not present

## 2020-01-22 DIAGNOSIS — R410 Disorientation, unspecified: Secondary | ICD-10-CM | POA: Diagnosis not present

## 2020-01-22 DIAGNOSIS — D649 Anemia, unspecified: Secondary | ICD-10-CM | POA: Diagnosis not present

## 2020-01-22 DIAGNOSIS — R5381 Other malaise: Secondary | ICD-10-CM | POA: Diagnosis not present

## 2020-01-24 DIAGNOSIS — R410 Disorientation, unspecified: Secondary | ICD-10-CM | POA: Diagnosis not present

## 2020-01-24 DIAGNOSIS — K922 Gastrointestinal hemorrhage, unspecified: Secondary | ICD-10-CM | POA: Diagnosis not present

## 2020-01-24 DIAGNOSIS — D649 Anemia, unspecified: Secondary | ICD-10-CM | POA: Diagnosis not present

## 2020-01-27 ENCOUNTER — Encounter (HOSPITAL_COMMUNITY): Payer: Self-pay

## 2020-01-27 ENCOUNTER — Emergency Department (HOSPITAL_COMMUNITY)
Admission: EM | Admit: 2020-01-27 | Discharge: 2020-01-27 | Disposition: A | Discharge status: Home / Self Care | Payer: Medicare Other | Attending: Emergency Medicine | Admitting: Emergency Medicine

## 2020-01-27 DIAGNOSIS — I959 Hypotension, unspecified: Secondary | ICD-10-CM | POA: Diagnosis not present

## 2020-01-27 DIAGNOSIS — R58 Hemorrhage, not elsewhere classified: Secondary | ICD-10-CM | POA: Diagnosis not present

## 2020-01-27 DIAGNOSIS — K625 Hemorrhage of anus and rectum: Secondary | ICD-10-CM | POA: Insufficient documentation

## 2020-01-27 DIAGNOSIS — K922 Gastrointestinal hemorrhage, unspecified: Secondary | ICD-10-CM | POA: Diagnosis not present

## 2020-01-27 LAB — CBC
HCT: 30.7 % — ABNORMAL LOW (ref 36.0–46.0)
Hemoglobin: 9.9 g/dL — ABNORMAL LOW (ref 12.0–15.0)
MCH: 31.5 pg (ref 26.0–34.0)
MCHC: 32.2 g/dL (ref 30.0–36.0)
MCV: 97.8 fL (ref 80.0–100.0)
Platelets: 211 10*3/uL (ref 150–400)
RBC: 3.14 MIL/uL — ABNORMAL LOW (ref 3.87–5.11)
RDW: 15.1 % (ref 11.5–15.5)
WBC: 5.7 10*3/uL (ref 4.0–10.5)
nRBC: 0 % (ref 0.0–0.2)

## 2020-01-27 LAB — HEMOGLOBIN AND HEMATOCRIT, BLOOD
HCT: 28.4 % — ABNORMAL LOW (ref 36.0–46.0)
Hemoglobin: 9.2 g/dL — ABNORMAL LOW (ref 12.0–15.0)

## 2020-01-27 LAB — COMPREHENSIVE METABOLIC PANEL
ALT: 6 U/L (ref 0–44)
AST: 13 U/L — ABNORMAL LOW (ref 15–41)
Albumin: 3.5 g/dL (ref 3.5–5.0)
Alkaline Phosphatase: 87 U/L (ref 38–126)
Anion gap: 9 (ref 5–15)
BUN: 31 mg/dL — ABNORMAL HIGH (ref 8–23)
CO2: 19 mmol/L — ABNORMAL LOW (ref 22–32)
Calcium: 9.2 mg/dL (ref 8.9–10.3)
Chloride: 112 mmol/L — ABNORMAL HIGH (ref 98–111)
Creatinine, Ser: 1.26 mg/dL — ABNORMAL HIGH (ref 0.44–1.00)
GFR calc Af Amer: 42 mL/min — ABNORMAL LOW (ref >60)
GFR calc non Af Amer: 36 mL/min — ABNORMAL LOW (ref >60)
Glucose, Bld: 96 mg/dL (ref 70–99)
Potassium: 4.3 mmol/L (ref 3.5–5.1)
Sodium: 140 mmol/L (ref 135–145)
Total Bilirubin: <0.1 mg/dL — ABNORMAL LOW (ref 0.3–1.2)
Total Protein: 6.2 g/dL — ABNORMAL LOW (ref 6.5–8.1)

## 2020-01-27 LAB — TYPE AND SCREEN
ABO/RH(D): A POS
Antibody Screen: NEGATIVE

## 2020-01-27 LAB — POC OCCULT BLOOD, ED: Fecal Occult Bld: POSITIVE — AB

## 2020-01-27 NOTE — ED Provider Notes (Signed)
[facility] EMERGENCY DEPARTMENT Provider Note   CSN: [id number] Arrival date & time: 01/27/20  0035     History Chief Complaint  Patient presents with  . GI Bleeding    [name] is a 86 y.o. female.  Pt presents to the ED today with bloody stool.  Pt was admitted from 5/30 to 6/1 with a GIB.  The pt had been on plavix due to a hx of TIA.  Pt's daughter opted to treat conservatively  She does not want any intervention like EGD or colonoscopy.  The pt's Plavix was held.  The daughter told the nursing home that she did not want her to go back to the hospital, but she was sent back last night for bloody stool.  Pt denies any pain.         Past Medical History:  Diagnosis Date  . Anemia   . Anginal pain ([place])   . Arthritis    "all over; doesn't seem to bother me much" (08/22/2013)  . Chronic lower back pain   . Dysrhythmia    "irregular heart beat"   . Exertional shortness of breath   . GERD (gastroesophageal reflux disease)   . Hypertension   . Hypothyroidism   . Migraines    "used to"  . Mixed hyperlipidemia   . Osteoporosis   . Paroxysmal supraventricular tachycardia ([place])   . Skin cancer    "couple taken off right leg" (08/22/2013)    Patient Active Problem List   Diagnosis Date Noted  . Acute blood loss anemia 01/19/2020  . GI bleed 01/19/2020  . Acute GI bleeding 01/18/2020  . E. coli UTI 12/04/2018  . UTI due to Klebsiella species 12/04/2018  . Abnormal magnetic resonance cholangiopancreatography (MRCP) 12/04/2018  . Hypophosphatemia 12/04/2018  . Hypokalemia 12/04/2018  . Proctitis 12/04/2018  . Hypomagnesemia 12/04/2018  . AKI (acute kidney injury) ([place]) 11/28/2018  . Viral gastroenteritis 11/28/2018  . Generalized weakness 11/28/2018  . Chronic anemia 11/28/2018  . Thrombocytopenia ([place]) 11/28/2018  . Chronic bilateral low back pain 10/31/2016  . Closed subcapital fracture of femur, left, initial encounter ([place]) 10/16/2016    . Peripheral vascular disease ([place]) 10/16/2016  . Essential hypertension 10/16/2016  . Hypothyroidism, acquired 10/16/2016  . Dry eye 04/21/2015  . H/O surgical procedure 05/25/2014  . 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 Pseudoaphakia 04/10/2014  . Acute appendicitis 08/22/2013    Past Surgical History:  Procedure Laterality Date  . ABDOMINAL HYSTERECTOMY    . ANTERIOR APPROACH HEMI HIP ARTHROPLASTY Left 10/18/2016   Procedure: ANTERIOR APPROACH LEFT HEMI HIP ARTHROPLASTY;  Surgeon: Leandrew Koyanagi, MD;  Location: [facility];  Service: Orthopedics;  Laterality: Left;  . APPENDECTOMY  08/22/2013  . BACK SURGERY    . CATARACT EXTRACTION W/ INTRAOCULAR LENS  IMPLANT, BILATERAL    . LAPAROSCOPIC APPENDECTOMY N/A 08/22/2013   Procedure: APPENDECTOMY LAPAROSCOPIC;  Surgeon: Imogene Burn. [name], MD;  Location: [facility];  Service: General;  Laterality: N/A;  . LUMBAR [place] SURGERY  2008  . THROAT SURGERY  1940's; ?   "cysts removed"      OB History   No obstetric history on file.     Family History  Family history unknown: Yes    Social History   Tobacco Use  . Smoking status: Never Smoker  . Smokeless tobacco: Never Used  Substance Use Topics  . Alcohol use: No  . Drug use: No    Home Medications Prior to Admission medications  Medication Sig Start Date End Date Taking? Authorizing Provider  bimatoprost (LUMIGAN) 0.01 % SOLN Place 1 drop into both eyes at bedtime.   Yes [provider]  brimonidine (ALPHAGAN) 0.15 % ophthalmic solution Place 1 drop into both eyes 3 (three) times daily.    Yes [provider]  Calcium 600-200 MG-UNIT tablet Take 2 tablets by mouth daily.    Yes [provider]  cloNIDine (CATAPRES) 0.1 MG tablet Take 0.1 mg by mouth daily as needed (for SBP>180).   Yes [provider]  Colesevelam HCl (WELCHOL) 3.75 g PACK Take 3.75 g by mouth daily.   Yes [provider]  diltiazem (CARDIZEM CD) 120 MG 24 hr capsule Take  120 mg by mouth daily.  08/31/18  Yes [provider]  dorzolamide-timolol (COSOPT) 22.3-6.8 MG/ML ophthalmic solution Place 1 drop into the left eye 2 (two) times daily. 05/07/14  Yes [provider]  ferrous sulfate 325 (65 FE) MG EC tablet Take 325 mg by mouth daily with breakfast.    Yes [provider]  fexofenadine (ALLEGRA) 60 MG tablet Take 60 mg by mouth 2 (two) times daily.   Yes [provider]  lactose free nutrition (BOOST) LIQD Take 237 mLs by mouth daily.   Yes [provider]  levothyroxine (SYNTHROID) 25 MCG tablet Take 25 mcg by mouth every other day. Alternating with 87mcg   Yes [provider]  levothyroxine (SYNTHROID, LEVOTHROID) 50 MCG tablet Take 50 mcg by mouth every other day.    Yes [provider]  Lidocaine HCl (ASPERCREME LIDOCAINE) 4 % CREA Apply topically in the morning and at bedtime. To affected area of mid-back   Yes [provider]  lisinopril (ZESTRIL) 20 MG tablet Take 20 mg by mouth daily.   Yes [provider]  loperamide (IMODIUM A-D) 2 MG tablet Take 2 mg by mouth daily as needed for diarrhea or loose stools.   Yes [provider]  meclizine (ANTIVERT) 25 MG tablet Take 25 mg by mouth 3 (three) times daily as needed for dizziness.   Yes [provider]  metoprolol (LOPRESSOR) 50 MG tablet Take 50 mg by mouth 2 (two) times daily.   Yes [provider]  mirtazapine (REMERON) 15 MG tablet Take 15 mg by mouth at bedtime.   Yes [provider]  nitroGLYCERIN (NITROSTAT) 0.4 MG SL tablet Place 1 tablet (0.4 mg total) under the tongue every 5 (five) minutes as needed for chest pain. 12/11/18  Yes Danford, Suann Larry, MD  Omega-3 Fatty Acids (FISH OIL) 1000 MG CAPS Take 2,000 mg by mouth daily.    Yes [provider]  omeprazole (PRILOSEC) 20 MG capsule Take 20 mg by mouth daily.   Yes [provider]  pantoprazole (PROTONIX) 40 MG  tablet Take 1 tablet (40 mg total) by mouth 2 (two) times daily before a meal. Take 2 times daily for 2 weeks, then 1 tablet daily/ Patient taking differently: Take 40 mg by mouth daily. then 1 tablet daily 01/20/20  Yes Guilford Shi, MD  Probiotic Product (PROBIOTIC-10 PO) Take 1 capsule by mouth in the morning and at bedtime.   Yes [provider]  sertraline (ZOLOFT) 100 MG tablet Take 100 mg by mouth daily.    Yes [provider]  aspirin EC 81 MG tablet Take 1 tablet (81 mg total) by mouth daily. Patient not taking: Reported on 01/27/2020 01/20/20 01/19/21  Guilford Shi, MD  benzonatate (TESSALON) 100 MG  capsule Take 1 capsule (100 mg total) by mouth 3 (three) times daily as needed for cough. Patient not taking: Reported on 01/18/2020 12/09/18   Eugenie Filler, MD  feeding supplement, ENSURE ENLIVE, (ENSURE ENLIVE) LIQD Take 237 mLs by mouth 2 (two) times daily between meals. Patient not taking: Reported on 01/18/2020 10/20/16   Mendel Corning, MD    Allergies    Codeine, Gabapentin, Tramadol, and Other  Review of Systems   Review of Systems  Gastrointestinal: Positive for blood in stool.  All other systems reviewed and are negative.   Physical Exam Updated Vital Signs BP (!) 150/48   Pulse (!) 52   Temp 98 F (36.7 C) (Oral)   Resp 17   SpO2 100%   Physical Exam Vitals and nursing note reviewed. Exam conducted with a chaperone present.  Constitutional:      Appearance: Normal appearance.  HENT:     Head: Normocephalic and atraumatic.     Right Ear: External ear normal.     Left Ear: External ear normal.     Nose: Nose normal.     Mouth/Throat:     Mouth: Mucous membranes are moist.     Pharynx: Oropharynx is clear.  Eyes:     Extraocular Movements: Extraocular movements intact.     Conjunctiva/sclera: Conjunctivae normal.     Pupils: Pupils are equal, round, and reactive to light.  Cardiovascular:     Rate and Rhythm: Normal rate and regular  rhythm.     Pulses: Normal pulses.     Heart sounds: Normal heart sounds.  Pulmonary:     Effort: Pulmonary effort is normal.     Breath sounds: Normal breath sounds.  Abdominal:     General: Abdomen is flat. Bowel sounds are normal.     Palpations: Abdomen is soft.  Genitourinary:    Rectum: Guaiac result positive.     Comments: Stool is black Musculoskeletal:        General: Normal range of motion.     Cervical back: Normal range of motion and neck supple.  Skin:    General: Skin is warm.     Capillary Refill: Capillary refill takes less than 2 seconds.  Neurological:     General: No focal deficit present.     Mental Status: She is alert. Mental status is at baseline.  Psychiatric:        Mood and Affect: Mood normal.        Behavior: Behavior normal.        Thought Content: Thought content normal.        Judgment: Judgment normal.     ED Results / Procedures / Treatments   Labs (all labs ordered are listed, but only abnormal results are displayed) Labs Reviewed  COMPREHENSIVE METABOLIC PANEL - Abnormal; Notable for the following components:      Result Value   Chloride 112 (*)    CO2 19 (*)    BUN 31 (*)    Creatinine, Ser 1.26 (*)    Total Protein 6.2 (*)    AST 13 (*)    Total Bilirubin <0.1 (*)    GFR calc non Af Amer 36 (*)    GFR calc Af Amer 42 (*)    All other components within normal limits  CBC - Abnormal; Notable for the following components:   RBC 3.14 (*)    Hemoglobin 9.9 (*)    HCT 30.7 (*)    All other components within  normal limits  HEMOGLOBIN AND HEMATOCRIT, BLOOD - Abnormal; Notable for the following components:   Hemoglobin 9.2 (*)    HCT 28.4 (*)    All other components within normal limits  POC OCCULT BLOOD, ED - Abnormal; Notable for the following components:   Fecal Occult Bld POSITIVE (*)    All other components within normal limits  TYPE AND SCREEN    EKG None  Radiology No results found.  Procedures Procedures (including  critical care time)  Medications Ordered in ED Medications - No data to display  ED Course  I have reviewed the triage vital signs and the nursing notes.  Pertinent labs & imaging results that were available during my care of the patient were reviewed by me and considered in my medical decision making (see chart for details).    MDM Rules/Calculators/A&P                      Pt does have evidence of an active GI bleed.  Blood count has dropped from 9.9 at 0130 to 9.2 at 0855.  Pt's daughter still would like to just keep her comfortable.  She would rather pt go back to the facility.  I told the daughter to communicate what she wants for patient to the facility so the patient does not get sent back here again if it is not wanted by family.     Final Clinical Impression(s) / ED Diagnoses Final diagnoses:  Acute GI bleeding    Rx / DC Orders ED Discharge Orders    None       Isla Pence, MD 01/27/20 1039

## 2020-01-27 NOTE — ED Notes (Signed)
Patient verbalizes understanding of discharge instructions. Opportunity for questioning and answers were provided. Armband removed by staff, pt discharged from ED with daughter back to [facility]. Report given to [facility].

## 2020-01-27 NOTE — ED Triage Notes (Signed)
Pt comes via [facility] from [facility] for GI bleed, bright red since yesterday

## 2020-01-28 DIAGNOSIS — K922 Gastrointestinal hemorrhage, unspecified: Secondary | ICD-10-CM | POA: Diagnosis not present

## 2020-01-28 DIAGNOSIS — D649 Anemia, unspecified: Secondary | ICD-10-CM | POA: Diagnosis not present

## 2020-01-28 DIAGNOSIS — E872 Acidosis: Secondary | ICD-10-CM | POA: Diagnosis not present

## 2020-01-29 DIAGNOSIS — Z03818 Encounter for observation for suspected exposure to other biological agents ruled out: Secondary | ICD-10-CM | POA: Diagnosis not present

## 2020-02-05 DIAGNOSIS — Z20822 Contact with and (suspected) exposure to covid-19: Secondary | ICD-10-CM | POA: Diagnosis not present

## 2020-02-09 DIAGNOSIS — F05 Delirium due to known physiological condition: Secondary | ICD-10-CM | POA: Diagnosis not present

## 2020-02-12 ENCOUNTER — Non-Acute Institutional Stay: Payer: Medicare Other

## 2020-02-12 ENCOUNTER — Other Ambulatory Visit: Payer: Self-pay

## 2020-02-12 ENCOUNTER — Other Ambulatory Visit: Payer: Medicare Other

## 2020-02-12 DIAGNOSIS — Z515 Encounter for palliative care: Secondary | ICD-10-CM

## 2020-02-13 ENCOUNTER — Telehealth: Payer: Self-pay

## 2020-02-13 NOTE — Progress Notes (Signed)
COMMUNITY PALLIATIVE CARE SW NOTE  PATIENT NAME: [name] DOB: 03/10/1921 MRN: [id number]  PRIMARY CARE PROVIDER: Husain, Karrar, MD  RESPONSIBLE PARTY:  Acct ID - Guarantor Home Phone Work Phone Relationship Acct Type  [id number] - Halpin,DO* [phone]  Self P/F     [address], [place], [place] [address]     PLAN OF CARE and INTERVENTIONS:             1. GOALS OF CARE/ ADVANCE CARE PLANNING:  Patient is a DNR. HCPOA is daughter, [name]. MOST form on file at SNF. Goal is for patient to remain LTC at [facility]. 2. SOCIAL/EMOTIONAL/SPIRITUAL ASSESSMENT/ INTERVENTIONS:  SW met with patient in her room at [facility] - 1206. Patient was sitting up in wheelchair. Staff reports her appetite is fair. Patient denies pain at this time. Staff reports that patient had a hospitalization/ED visit earlier this month on 6/8 due to GI bleed.  Patient said she is asleep then she does sleep well. Patient said she spends most of her time watching television. Staff reports that patient continues to be pleasantly confused and will ambulate self in WC down hallways looking for different people she used to know. SW provided supportive counseling, discussed goals and care plan, and used active and reflective listening.  3. PATIENT/CAREGIVER EDUCATION/ COPING:  Patient was alert, oriented to self and place. Patient is pleasant and calm. Patient said she is coping "okay", does desire to go home. Patient has supportive family who is able to do in facility visits now that facility visitation restrictions have been lifted. 4. PERSONAL EMERGENCY PLAN: Facility to follow protocol. 5. COMMUNITY RESOURCES COORDINATION/ HEALTH CARE NAVIGATION:  none 6. FINANCIAL/LEGAL CONCERNS/INTERVENTIONS:  None. Patient payor at SNF is Medicaid.      SOCIAL HX:  Social History   Tobacco Use  . Smoking status: Never Smoker  . Smokeless tobacco: Never Used  Substance Use Topics  . Alcohol use: No    CODE STATUS:  DNR ADVANCED DIRECTIVES: Y MOST FORM COMPLETE:  Y HOSPICE EDUCATION PROVIDED: N  PPS: Patient needs assistance with bathing and dressing. Patient is able to feed herself.   Time Spent: Visit was 30 min.        , LCSW 

## 2020-02-13 NOTE — Telephone Encounter (Signed)
none

## 2020-02-19 DIAGNOSIS — R41841 Cognitive communication deficit: Secondary | ICD-10-CM | POA: Diagnosis not present

## 2020-02-19 DIAGNOSIS — D638 Anemia in other chronic diseases classified elsewhere: Secondary | ICD-10-CM | POA: Diagnosis not present

## 2020-02-19 DIAGNOSIS — A048 Other specified bacterial intestinal infections: Secondary | ICD-10-CM | POA: Diagnosis not present

## 2020-02-19 DIAGNOSIS — M6281 Muscle weakness (generalized): Secondary | ICD-10-CM | POA: Diagnosis not present

## 2020-02-19 DIAGNOSIS — R278 Other lack of coordination: Secondary | ICD-10-CM | POA: Diagnosis not present

## 2020-02-20 DIAGNOSIS — R41841 Cognitive communication deficit: Secondary | ICD-10-CM | POA: Diagnosis not present

## 2020-02-20 DIAGNOSIS — R278 Other lack of coordination: Secondary | ICD-10-CM | POA: Diagnosis not present

## 2020-02-20 DIAGNOSIS — A048 Other specified bacterial intestinal infections: Secondary | ICD-10-CM | POA: Diagnosis not present

## 2020-02-20 DIAGNOSIS — D638 Anemia in other chronic diseases classified elsewhere: Secondary | ICD-10-CM | POA: Diagnosis not present

## 2020-02-20 DIAGNOSIS — M6281 Muscle weakness (generalized): Secondary | ICD-10-CM | POA: Diagnosis not present

## 2020-02-21 DIAGNOSIS — R278 Other lack of coordination: Secondary | ICD-10-CM | POA: Diagnosis not present

## 2020-02-21 DIAGNOSIS — M6281 Muscle weakness (generalized): Secondary | ICD-10-CM | POA: Diagnosis not present

## 2020-02-21 DIAGNOSIS — R41841 Cognitive communication deficit: Secondary | ICD-10-CM | POA: Diagnosis not present

## 2020-02-21 DIAGNOSIS — A048 Other specified bacterial intestinal infections: Secondary | ICD-10-CM | POA: Diagnosis not present

## 2020-02-21 DIAGNOSIS — D638 Anemia in other chronic diseases classified elsewhere: Secondary | ICD-10-CM | POA: Diagnosis not present

## 2020-02-23 DIAGNOSIS — R278 Other lack of coordination: Secondary | ICD-10-CM | POA: Diagnosis not present

## 2020-02-23 DIAGNOSIS — R41841 Cognitive communication deficit: Secondary | ICD-10-CM | POA: Diagnosis not present

## 2020-02-23 DIAGNOSIS — D638 Anemia in other chronic diseases classified elsewhere: Secondary | ICD-10-CM | POA: Diagnosis not present

## 2020-02-23 DIAGNOSIS — M6281 Muscle weakness (generalized): Secondary | ICD-10-CM | POA: Diagnosis not present

## 2020-02-23 DIAGNOSIS — A048 Other specified bacterial intestinal infections: Secondary | ICD-10-CM | POA: Diagnosis not present

## 2020-02-24 DIAGNOSIS — R278 Other lack of coordination: Secondary | ICD-10-CM | POA: Diagnosis not present

## 2020-02-24 DIAGNOSIS — A048 Other specified bacterial intestinal infections: Secondary | ICD-10-CM | POA: Diagnosis not present

## 2020-02-24 DIAGNOSIS — R41841 Cognitive communication deficit: Secondary | ICD-10-CM | POA: Diagnosis not present

## 2020-02-24 DIAGNOSIS — M6281 Muscle weakness (generalized): Secondary | ICD-10-CM | POA: Diagnosis not present

## 2020-02-24 DIAGNOSIS — D638 Anemia in other chronic diseases classified elsewhere: Secondary | ICD-10-CM | POA: Diagnosis not present

## 2020-02-25 ENCOUNTER — Other Ambulatory Visit: Payer: Self-pay

## 2020-02-25 ENCOUNTER — Non-Acute Institutional Stay: Payer: Medicare Other

## 2020-02-25 DIAGNOSIS — R278 Other lack of coordination: Secondary | ICD-10-CM | POA: Diagnosis not present

## 2020-02-25 DIAGNOSIS — R41841 Cognitive communication deficit: Secondary | ICD-10-CM | POA: Diagnosis not present

## 2020-02-25 DIAGNOSIS — Z515 Encounter for palliative care: Secondary | ICD-10-CM

## 2020-02-25 DIAGNOSIS — M6281 Muscle weakness (generalized): Secondary | ICD-10-CM | POA: Diagnosis not present

## 2020-02-25 DIAGNOSIS — A048 Other specified bacterial intestinal infections: Secondary | ICD-10-CM | POA: Diagnosis not present

## 2020-02-25 DIAGNOSIS — D638 Anemia in other chronic diseases classified elsewhere: Secondary | ICD-10-CM | POA: Diagnosis not present

## 2020-02-25 NOTE — Progress Notes (Signed)
COMMUNITY PALLIATIVE CARE SW NOTE  PATIENT NAME: [name] DOB: 1921-07-27 MRN: [id number]  PRIMARY CARE PROVIDER: Wenda Low, MD  RESPONSIBLE PARTY:  Acct ID - Guarantor Home Phone Work Phone Relationship Acct Type  [id number] [name]  Self P/F     [place], [name], [place] [address]     PLAN OF CARE and INTERVENTIONS:             1. GOALS OF CARE/ ADVANCE CARE PLANNING: Patient is a DNR. HCPOA is daughter, [name]. MOST form on file at SNF. Goal is for patient to remain LTC at [facility]. 2. SOCIAL/EMOTIONAL/SPIRITUAL ASSESSMENT/ INTERVENTIONS:  SW met with patient in her room at [facility]. Patient was sitting up in wheelchair. Staff reports her appetite is fair. Patient denies pain at this time. No recent hospitalizations.  Patient sleep well. Patient said she spends most of her time watching television. Staff reports that patient continues to be pleasantly confused and will ambulate self in [facility] down hallways looking for different people she used to know. N new medication changes. SW provided supportive counseling, discussed goals and care plan, and used active and reflective listening.  3. PATIENT/CAREGIVER EDUCATION/ COPING:  Patient was alert, oriented to self and place. Patient is pleasant and calm. Patient has supportive family. 4. PERSONAL EMERGENCY PLAN:  Facility to follow protocol. 5. COMMUNITY RESOURCES COORDINATION/ HEALTH CARE NAVIGATION:  None 6. FINANCIAL/LEGAL CONCERNS/INTERVENTIONS:  None. Patient payor at SNF is Medicaid.     SOCIAL HX:  Social History   Tobacco Use  . Smoking status: Never Smoker  . Smokeless tobacco: Never Used  Substance Use Topics  . Alcohol use: No    CODE STATUS: DNR ADVANCED DIRECTIVES: Y MOST FORM COMPLETE:  Y HOSPICE EDUCATION PROVIDED: N  [id number] assist with ADL's. Patient is able to feed self.  Time Spent: visit lasted 30 mins.       Doreene Eland, [place]

## 2020-02-26 DIAGNOSIS — R41841 Cognitive communication deficit: Secondary | ICD-10-CM | POA: Diagnosis not present

## 2020-02-26 DIAGNOSIS — D638 Anemia in other chronic diseases classified elsewhere: Secondary | ICD-10-CM | POA: Diagnosis not present

## 2020-02-26 DIAGNOSIS — A048 Other specified bacterial intestinal infections: Secondary | ICD-10-CM | POA: Diagnosis not present

## 2020-02-26 DIAGNOSIS — R278 Other lack of coordination: Secondary | ICD-10-CM | POA: Diagnosis not present

## 2020-02-26 DIAGNOSIS — M6281 Muscle weakness (generalized): Secondary | ICD-10-CM | POA: Diagnosis not present

## 2020-02-27 DIAGNOSIS — R41841 Cognitive communication deficit: Secondary | ICD-10-CM | POA: Diagnosis not present

## 2020-02-27 DIAGNOSIS — R278 Other lack of coordination: Secondary | ICD-10-CM | POA: Diagnosis not present

## 2020-02-27 DIAGNOSIS — A048 Other specified bacterial intestinal infections: Secondary | ICD-10-CM | POA: Diagnosis not present

## 2020-02-27 DIAGNOSIS — M6281 Muscle weakness (generalized): Secondary | ICD-10-CM | POA: Diagnosis not present

## 2020-02-27 DIAGNOSIS — D638 Anemia in other chronic diseases classified elsewhere: Secondary | ICD-10-CM | POA: Diagnosis not present

## 2020-02-28 DIAGNOSIS — D638 Anemia in other chronic diseases classified elsewhere: Secondary | ICD-10-CM | POA: Diagnosis not present

## 2020-02-28 DIAGNOSIS — A048 Other specified bacterial intestinal infections: Secondary | ICD-10-CM | POA: Diagnosis not present

## 2020-02-28 DIAGNOSIS — R278 Other lack of coordination: Secondary | ICD-10-CM | POA: Diagnosis not present

## 2020-02-28 DIAGNOSIS — R41841 Cognitive communication deficit: Secondary | ICD-10-CM | POA: Diagnosis not present

## 2020-02-28 DIAGNOSIS — M6281 Muscle weakness (generalized): Secondary | ICD-10-CM | POA: Diagnosis not present

## 2020-03-01 DIAGNOSIS — D638 Anemia in other chronic diseases classified elsewhere: Secondary | ICD-10-CM | POA: Diagnosis not present

## 2020-03-01 DIAGNOSIS — R278 Other lack of coordination: Secondary | ICD-10-CM | POA: Diagnosis not present

## 2020-03-01 DIAGNOSIS — A048 Other specified bacterial intestinal infections: Secondary | ICD-10-CM | POA: Diagnosis not present

## 2020-03-01 DIAGNOSIS — R41841 Cognitive communication deficit: Secondary | ICD-10-CM | POA: Diagnosis not present

## 2020-03-01 DIAGNOSIS — R197 Diarrhea, unspecified: Secondary | ICD-10-CM | POA: Diagnosis not present

## 2020-03-01 DIAGNOSIS — M6281 Muscle weakness (generalized): Secondary | ICD-10-CM | POA: Diagnosis not present

## 2020-03-02 DIAGNOSIS — R41841 Cognitive communication deficit: Secondary | ICD-10-CM | POA: Diagnosis not present

## 2020-03-02 DIAGNOSIS — R278 Other lack of coordination: Secondary | ICD-10-CM | POA: Diagnosis not present

## 2020-03-02 DIAGNOSIS — D638 Anemia in other chronic diseases classified elsewhere: Secondary | ICD-10-CM | POA: Diagnosis not present

## 2020-03-02 DIAGNOSIS — M6281 Muscle weakness (generalized): Secondary | ICD-10-CM | POA: Diagnosis not present

## 2020-03-02 DIAGNOSIS — A048 Other specified bacterial intestinal infections: Secondary | ICD-10-CM | POA: Diagnosis not present

## 2020-03-03 DIAGNOSIS — M6281 Muscle weakness (generalized): Secondary | ICD-10-CM | POA: Diagnosis not present

## 2020-03-03 DIAGNOSIS — D638 Anemia in other chronic diseases classified elsewhere: Secondary | ICD-10-CM | POA: Diagnosis not present

## 2020-03-03 DIAGNOSIS — R41841 Cognitive communication deficit: Secondary | ICD-10-CM | POA: Diagnosis not present

## 2020-03-03 DIAGNOSIS — R278 Other lack of coordination: Secondary | ICD-10-CM | POA: Diagnosis not present

## 2020-03-03 DIAGNOSIS — A048 Other specified bacterial intestinal infections: Secondary | ICD-10-CM | POA: Diagnosis not present

## 2020-03-04 DIAGNOSIS — R410 Disorientation, unspecified: Secondary | ICD-10-CM | POA: Diagnosis not present

## 2020-03-04 DIAGNOSIS — M6281 Muscle weakness (generalized): Secondary | ICD-10-CM | POA: Diagnosis not present

## 2020-03-04 DIAGNOSIS — R197 Diarrhea, unspecified: Secondary | ICD-10-CM | POA: Diagnosis not present

## 2020-03-04 DIAGNOSIS — D638 Anemia in other chronic diseases classified elsewhere: Secondary | ICD-10-CM | POA: Diagnosis not present

## 2020-03-04 DIAGNOSIS — A048 Other specified bacterial intestinal infections: Secondary | ICD-10-CM | POA: Diagnosis not present

## 2020-03-04 DIAGNOSIS — R41841 Cognitive communication deficit: Secondary | ICD-10-CM | POA: Diagnosis not present

## 2020-03-04 DIAGNOSIS — R278 Other lack of coordination: Secondary | ICD-10-CM | POA: Diagnosis not present

## 2020-03-05 DIAGNOSIS — D638 Anemia in other chronic diseases classified elsewhere: Secondary | ICD-10-CM | POA: Diagnosis not present

## 2020-03-05 DIAGNOSIS — R197 Diarrhea, unspecified: Secondary | ICD-10-CM | POA: Diagnosis not present

## 2020-03-05 DIAGNOSIS — R41841 Cognitive communication deficit: Secondary | ICD-10-CM | POA: Diagnosis not present

## 2020-03-05 DIAGNOSIS — A048 Other specified bacterial intestinal infections: Secondary | ICD-10-CM | POA: Diagnosis not present

## 2020-03-05 DIAGNOSIS — T50905A Adverse effect of unspecified drugs, medicaments and biological substances, initial encounter: Secondary | ICD-10-CM | POA: Diagnosis not present

## 2020-03-05 DIAGNOSIS — M6281 Muscle weakness (generalized): Secondary | ICD-10-CM | POA: Diagnosis not present

## 2020-03-05 DIAGNOSIS — R410 Disorientation, unspecified: Secondary | ICD-10-CM | POA: Diagnosis not present

## 2020-03-05 DIAGNOSIS — R278 Other lack of coordination: Secondary | ICD-10-CM | POA: Diagnosis not present

## 2020-03-06 DIAGNOSIS — A048 Other specified bacterial intestinal infections: Secondary | ICD-10-CM | POA: Diagnosis not present

## 2020-03-06 DIAGNOSIS — R278 Other lack of coordination: Secondary | ICD-10-CM | POA: Diagnosis not present

## 2020-03-06 DIAGNOSIS — D638 Anemia in other chronic diseases classified elsewhere: Secondary | ICD-10-CM | POA: Diagnosis not present

## 2020-03-06 DIAGNOSIS — R41841 Cognitive communication deficit: Secondary | ICD-10-CM | POA: Diagnosis not present

## 2020-03-06 DIAGNOSIS — M6281 Muscle weakness (generalized): Secondary | ICD-10-CM | POA: Diagnosis not present

## 2020-03-07 DIAGNOSIS — R41841 Cognitive communication deficit: Secondary | ICD-10-CM | POA: Diagnosis not present

## 2020-03-07 DIAGNOSIS — A048 Other specified bacterial intestinal infections: Secondary | ICD-10-CM | POA: Diagnosis not present

## 2020-03-07 DIAGNOSIS — R278 Other lack of coordination: Secondary | ICD-10-CM | POA: Diagnosis not present

## 2020-03-07 DIAGNOSIS — D638 Anemia in other chronic diseases classified elsewhere: Secondary | ICD-10-CM | POA: Diagnosis not present

## 2020-03-07 DIAGNOSIS — M6281 Muscle weakness (generalized): Secondary | ICD-10-CM | POA: Diagnosis not present

## 2020-03-08 DIAGNOSIS — D638 Anemia in other chronic diseases classified elsewhere: Secondary | ICD-10-CM | POA: Diagnosis not present

## 2020-03-08 DIAGNOSIS — R410 Disorientation, unspecified: Secondary | ICD-10-CM | POA: Diagnosis not present

## 2020-03-08 DIAGNOSIS — R197 Diarrhea, unspecified: Secondary | ICD-10-CM | POA: Diagnosis not present

## 2020-03-08 DIAGNOSIS — R41841 Cognitive communication deficit: Secondary | ICD-10-CM | POA: Diagnosis not present

## 2020-03-08 DIAGNOSIS — A048 Other specified bacterial intestinal infections: Secondary | ICD-10-CM | POA: Diagnosis not present

## 2020-03-08 DIAGNOSIS — M6281 Muscle weakness (generalized): Secondary | ICD-10-CM | POA: Diagnosis not present

## 2020-03-08 DIAGNOSIS — R278 Other lack of coordination: Secondary | ICD-10-CM | POA: Diagnosis not present

## 2020-03-09 DIAGNOSIS — R41841 Cognitive communication deficit: Secondary | ICD-10-CM | POA: Diagnosis not present

## 2020-03-09 DIAGNOSIS — A048 Other specified bacterial intestinal infections: Secondary | ICD-10-CM | POA: Diagnosis not present

## 2020-03-09 DIAGNOSIS — R278 Other lack of coordination: Secondary | ICD-10-CM | POA: Diagnosis not present

## 2020-03-09 DIAGNOSIS — D638 Anemia in other chronic diseases classified elsewhere: Secondary | ICD-10-CM | POA: Diagnosis not present

## 2020-03-09 DIAGNOSIS — M6281 Muscle weakness (generalized): Secondary | ICD-10-CM | POA: Diagnosis not present

## 2020-03-10 DIAGNOSIS — M6281 Muscle weakness (generalized): Secondary | ICD-10-CM | POA: Diagnosis not present

## 2020-03-10 DIAGNOSIS — T50905A Adverse effect of unspecified drugs, medicaments and biological substances, initial encounter: Secondary | ICD-10-CM | POA: Diagnosis not present

## 2020-03-10 DIAGNOSIS — R410 Disorientation, unspecified: Secondary | ICD-10-CM | POA: Diagnosis not present

## 2020-03-10 DIAGNOSIS — R278 Other lack of coordination: Secondary | ICD-10-CM | POA: Diagnosis not present

## 2020-03-10 DIAGNOSIS — R197 Diarrhea, unspecified: Secondary | ICD-10-CM | POA: Diagnosis not present

## 2020-03-10 DIAGNOSIS — R41841 Cognitive communication deficit: Secondary | ICD-10-CM | POA: Diagnosis not present

## 2020-03-10 DIAGNOSIS — A048 Other specified bacterial intestinal infections: Secondary | ICD-10-CM | POA: Diagnosis not present

## 2020-03-10 DIAGNOSIS — D638 Anemia in other chronic diseases classified elsewhere: Secondary | ICD-10-CM | POA: Diagnosis not present

## 2020-03-11 DIAGNOSIS — I10 Essential (primary) hypertension: Secondary | ICD-10-CM | POA: Diagnosis not present

## 2020-03-11 DIAGNOSIS — D649 Anemia, unspecified: Secondary | ICD-10-CM | POA: Diagnosis not present

## 2020-03-11 DIAGNOSIS — A048 Other specified bacterial intestinal infections: Secondary | ICD-10-CM | POA: Diagnosis not present

## 2020-03-11 DIAGNOSIS — R05 Cough: Secondary | ICD-10-CM | POA: Diagnosis not present

## 2020-03-11 DIAGNOSIS — K922 Gastrointestinal hemorrhage, unspecified: Secondary | ICD-10-CM | POA: Diagnosis not present

## 2020-03-11 DIAGNOSIS — R918 Other nonspecific abnormal finding of lung field: Secondary | ICD-10-CM | POA: Diagnosis not present

## 2020-03-11 DIAGNOSIS — R600 Localized edema: Secondary | ICD-10-CM | POA: Diagnosis not present

## 2020-03-11 DIAGNOSIS — R197 Diarrhea, unspecified: Secondary | ICD-10-CM | POA: Diagnosis not present

## 2020-03-11 DIAGNOSIS — R079 Chest pain, unspecified: Secondary | ICD-10-CM | POA: Diagnosis not present

## 2020-03-11 DIAGNOSIS — D638 Anemia in other chronic diseases classified elsewhere: Secondary | ICD-10-CM | POA: Diagnosis not present

## 2020-03-11 DIAGNOSIS — R41841 Cognitive communication deficit: Secondary | ICD-10-CM | POA: Diagnosis not present

## 2020-03-11 DIAGNOSIS — M6281 Muscle weakness (generalized): Secondary | ICD-10-CM | POA: Diagnosis not present

## 2020-03-11 DIAGNOSIS — R278 Other lack of coordination: Secondary | ICD-10-CM | POA: Diagnosis not present

## 2020-03-11 DIAGNOSIS — E039 Hypothyroidism, unspecified: Secondary | ICD-10-CM | POA: Diagnosis not present

## 2020-03-12 DIAGNOSIS — R918 Other nonspecific abnormal finding of lung field: Secondary | ICD-10-CM | POA: Diagnosis not present

## 2020-03-12 DIAGNOSIS — A048 Other specified bacterial intestinal infections: Secondary | ICD-10-CM | POA: Diagnosis not present

## 2020-03-12 DIAGNOSIS — Z20828 Contact with and (suspected) exposure to other viral communicable diseases: Secondary | ICD-10-CM | POA: Diagnosis not present

## 2020-03-12 DIAGNOSIS — R278 Other lack of coordination: Secondary | ICD-10-CM | POA: Diagnosis not present

## 2020-03-12 DIAGNOSIS — M6281 Muscle weakness (generalized): Secondary | ICD-10-CM | POA: Diagnosis not present

## 2020-03-12 DIAGNOSIS — R41841 Cognitive communication deficit: Secondary | ICD-10-CM | POA: Diagnosis not present

## 2020-03-12 DIAGNOSIS — D638 Anemia in other chronic diseases classified elsewhere: Secondary | ICD-10-CM | POA: Diagnosis not present

## 2020-03-15 DIAGNOSIS — R05 Cough: Secondary | ICD-10-CM | POA: Diagnosis not present

## 2020-03-15 DIAGNOSIS — A048 Other specified bacterial intestinal infections: Secondary | ICD-10-CM | POA: Diagnosis not present

## 2020-03-15 DIAGNOSIS — R0989 Other specified symptoms and signs involving the circulatory and respiratory systems: Secondary | ICD-10-CM | POA: Diagnosis not present

## 2020-03-15 DIAGNOSIS — M6281 Muscle weakness (generalized): Secondary | ICD-10-CM | POA: Diagnosis not present

## 2020-03-15 DIAGNOSIS — R918 Other nonspecific abnormal finding of lung field: Secondary | ICD-10-CM | POA: Diagnosis not present

## 2020-03-15 DIAGNOSIS — R41841 Cognitive communication deficit: Secondary | ICD-10-CM | POA: Diagnosis not present

## 2020-03-15 DIAGNOSIS — R278 Other lack of coordination: Secondary | ICD-10-CM | POA: Diagnosis not present

## 2020-03-15 DIAGNOSIS — D638 Anemia in other chronic diseases classified elsewhere: Secondary | ICD-10-CM | POA: Diagnosis not present

## 2020-03-16 DIAGNOSIS — R278 Other lack of coordination: Secondary | ICD-10-CM | POA: Diagnosis not present

## 2020-03-16 DIAGNOSIS — R221 Localized swelling, mass and lump, neck: Secondary | ICD-10-CM | POA: Diagnosis not present

## 2020-03-16 DIAGNOSIS — M6281 Muscle weakness (generalized): Secondary | ICD-10-CM | POA: Diagnosis not present

## 2020-03-16 DIAGNOSIS — A048 Other specified bacterial intestinal infections: Secondary | ICD-10-CM | POA: Diagnosis not present

## 2020-03-16 DIAGNOSIS — R41841 Cognitive communication deficit: Secondary | ICD-10-CM | POA: Diagnosis not present

## 2020-03-16 DIAGNOSIS — D638 Anemia in other chronic diseases classified elsewhere: Secondary | ICD-10-CM | POA: Diagnosis not present

## 2020-03-16 DIAGNOSIS — R05 Cough: Secondary | ICD-10-CM | POA: Diagnosis not present

## 2020-03-17 DIAGNOSIS — D638 Anemia in other chronic diseases classified elsewhere: Secondary | ICD-10-CM | POA: Diagnosis not present

## 2020-03-17 DIAGNOSIS — R41841 Cognitive communication deficit: Secondary | ICD-10-CM | POA: Diagnosis not present

## 2020-03-17 DIAGNOSIS — A048 Other specified bacterial intestinal infections: Secondary | ICD-10-CM | POA: Diagnosis not present

## 2020-03-17 DIAGNOSIS — D210 Benign neoplasm of connective and other soft tissue of head, face and neck: Secondary | ICD-10-CM | POA: Diagnosis not present

## 2020-03-17 DIAGNOSIS — R278 Other lack of coordination: Secondary | ICD-10-CM | POA: Diagnosis not present

## 2020-03-17 DIAGNOSIS — M6281 Muscle weakness (generalized): Secondary | ICD-10-CM | POA: Diagnosis not present

## 2020-03-18 DIAGNOSIS — D638 Anemia in other chronic diseases classified elsewhere: Secondary | ICD-10-CM | POA: Diagnosis not present

## 2020-03-18 DIAGNOSIS — A048 Other specified bacterial intestinal infections: Secondary | ICD-10-CM | POA: Diagnosis not present

## 2020-03-18 DIAGNOSIS — R278 Other lack of coordination: Secondary | ICD-10-CM | POA: Diagnosis not present

## 2020-03-18 DIAGNOSIS — R41841 Cognitive communication deficit: Secondary | ICD-10-CM | POA: Diagnosis not present

## 2020-03-18 DIAGNOSIS — M6281 Muscle weakness (generalized): Secondary | ICD-10-CM | POA: Diagnosis not present

## 2020-03-19 DIAGNOSIS — D638 Anemia in other chronic diseases classified elsewhere: Secondary | ICD-10-CM | POA: Diagnosis not present

## 2020-03-19 DIAGNOSIS — R918 Other nonspecific abnormal finding of lung field: Secondary | ICD-10-CM | POA: Diagnosis not present

## 2020-03-19 DIAGNOSIS — A048 Other specified bacterial intestinal infections: Secondary | ICD-10-CM | POA: Diagnosis not present

## 2020-03-19 DIAGNOSIS — M6281 Muscle weakness (generalized): Secondary | ICD-10-CM | POA: Diagnosis not present

## 2020-03-19 DIAGNOSIS — R221 Localized swelling, mass and lump, neck: Secondary | ICD-10-CM | POA: Diagnosis not present

## 2020-03-19 DIAGNOSIS — R278 Other lack of coordination: Secondary | ICD-10-CM | POA: Diagnosis not present

## 2020-03-19 DIAGNOSIS — Z20828 Contact with and (suspected) exposure to other viral communicable diseases: Secondary | ICD-10-CM | POA: Diagnosis not present

## 2020-03-19 DIAGNOSIS — R41841 Cognitive communication deficit: Secondary | ICD-10-CM | POA: Diagnosis not present

## 2020-03-19 DIAGNOSIS — R05 Cough: Secondary | ICD-10-CM | POA: Diagnosis not present

## 2020-03-20 DIAGNOSIS — A048 Other specified bacterial intestinal infections: Secondary | ICD-10-CM | POA: Diagnosis not present

## 2020-03-20 DIAGNOSIS — R278 Other lack of coordination: Secondary | ICD-10-CM | POA: Diagnosis not present

## 2020-03-20 DIAGNOSIS — R41841 Cognitive communication deficit: Secondary | ICD-10-CM | POA: Diagnosis not present

## 2020-03-20 DIAGNOSIS — D638 Anemia in other chronic diseases classified elsewhere: Secondary | ICD-10-CM | POA: Diagnosis not present

## 2020-03-20 DIAGNOSIS — M6281 Muscle weakness (generalized): Secondary | ICD-10-CM | POA: Diagnosis not present

## 2020-03-22 DIAGNOSIS — D649 Anemia, unspecified: Secondary | ICD-10-CM | POA: Diagnosis not present

## 2020-03-22 DIAGNOSIS — R278 Other lack of coordination: Secondary | ICD-10-CM | POA: Diagnosis not present

## 2020-03-22 DIAGNOSIS — D638 Anemia in other chronic diseases classified elsewhere: Secondary | ICD-10-CM | POA: Diagnosis not present

## 2020-03-22 DIAGNOSIS — M6281 Muscle weakness (generalized): Secondary | ICD-10-CM | POA: Diagnosis not present

## 2020-03-22 DIAGNOSIS — A048 Other specified bacterial intestinal infections: Secondary | ICD-10-CM | POA: Diagnosis not present

## 2020-03-22 DIAGNOSIS — I10 Essential (primary) hypertension: Secondary | ICD-10-CM | POA: Diagnosis not present

## 2020-03-22 DIAGNOSIS — R41841 Cognitive communication deficit: Secondary | ICD-10-CM | POA: Diagnosis not present

## 2020-03-23 DIAGNOSIS — D638 Anemia in other chronic diseases classified elsewhere: Secondary | ICD-10-CM | POA: Diagnosis not present

## 2020-03-23 DIAGNOSIS — M6281 Muscle weakness (generalized): Secondary | ICD-10-CM | POA: Diagnosis not present

## 2020-03-23 DIAGNOSIS — D649 Anemia, unspecified: Secondary | ICD-10-CM | POA: Diagnosis not present

## 2020-03-23 DIAGNOSIS — R278 Other lack of coordination: Secondary | ICD-10-CM | POA: Diagnosis not present

## 2020-03-23 DIAGNOSIS — A048 Other specified bacterial intestinal infections: Secondary | ICD-10-CM | POA: Diagnosis not present

## 2020-03-23 DIAGNOSIS — R41841 Cognitive communication deficit: Secondary | ICD-10-CM | POA: Diagnosis not present

## 2020-03-24 ENCOUNTER — Other Ambulatory Visit: Payer: Self-pay

## 2020-03-24 ENCOUNTER — Non-Acute Institutional Stay: Payer: Medicare Other | Admitting: Adult Health Nurse Practitioner

## 2020-03-24 DIAGNOSIS — Z515 Encounter for palliative care: Secondary | ICD-10-CM

## 2020-03-24 DIAGNOSIS — I739 Peripheral vascular disease, unspecified: Secondary | ICD-10-CM

## 2020-03-24 DIAGNOSIS — D649 Anemia, unspecified: Secondary | ICD-10-CM | POA: Diagnosis not present

## 2020-03-24 DIAGNOSIS — D638 Anemia in other chronic diseases classified elsewhere: Secondary | ICD-10-CM | POA: Diagnosis not present

## 2020-03-24 DIAGNOSIS — R41841 Cognitive communication deficit: Secondary | ICD-10-CM | POA: Diagnosis not present

## 2020-03-24 DIAGNOSIS — M6281 Muscle weakness (generalized): Secondary | ICD-10-CM | POA: Diagnosis not present

## 2020-03-24 DIAGNOSIS — A048 Other specified bacterial intestinal infections: Secondary | ICD-10-CM | POA: Diagnosis not present

## 2020-03-24 DIAGNOSIS — R278 Other lack of coordination: Secondary | ICD-10-CM | POA: Diagnosis not present

## 2020-03-24 NOTE — Progress Notes (Addendum)
[facility] Consult Note Telephone: [phone]  Fax: [phone]  PATIENT NAME: [name] DOB: [birth year] MRN: [id number]  PRIMARY CARE PROVIDER:   Wenda Low, MD  REFERRING PROVIDER:  Wenda Low, MD [address] [place],  [name] [address]  RESPONSIBLE PARTY:   Self [phone] Emergency contact - DaughterRosana [name]     RECOMMENDATIONS and PLAN:  1.  Advanced care planning.  Patient is DNR.  Spoke with daughter via phone and updated on today's visit.  2.  Functional status.  Patient propels herself around in wheelchair.  She is wheelchair-bound but is able to transfer herself.  Does require some assistance with ADLs such as bathing and dressing.  Does state that she has some dizziness with quick movements but tries to move slowly to avoid this.  No reports of falls.  Continue supportive care at facility  3.  Cognitive impairment.  Patient does have history of TIAs.  Patient is working with [name] at facility.  Speech therapist does state that she will have "off days" in which she is more confused and is suspicious.  Speech therapist states that she has noticed when she is having these off days that she will also start leaning towards the left.  Patient is leaning towards the left in her wheelchair today.  But she is able to reposition herself in wheelchair and propel herself.  Continue ST as ordered and supportive care at facility  Patient seems stable at this time with no falls, infection, hospital visits since last visit.  Palliative will continue to monitor for symptom management/decline and make recommendations as needed.  PMPM team will continue to follow this patient  I spent 40 minutes providing this consultation,  from 9:30 to 10:10 including time spent with patient/family, chart review, provider coordination, documentation. More than 50% of the time in this consultation was  spent coordinating communication.   HISTORY OF PRESENT ILLNESS:  [name] is a 86 y.o. year old female with multiple medical problems including GERD chronic low back pain, HTN Hypothyroidism.  Palliative Care was asked to help address goals of care.  Patient denies pain, headaches, increased shortness of breath or cough, N/V/D, constipation, dysuria, hematuria.  Patient eats good with no reported weight loss.  Patient has no wounds.  CODE STATUS: DNR  PPS: 40% HOSPICE ELIGIBILITY/DIAGNOSIS: TBD  PHYSICAL EXAM:  BP 152/78  HR 65 O2 100% on RA General: NAD, frail appearing, thin Cardiovascular:  Irregularly regular rate and rhythm Pulmonary: lung sounds clear; normal respiratory effort Abdomen: soft, nontender, + bowel sounds GU: no suprapubic tenderness Extremities: trace edema, no joint deformities Skin: no rashes on exposed skin Neurological: Weakness; patient has confusion that comes and goes; is leaning to the left today   PAST MEDICAL HISTORY:  Past Medical History:  Diagnosis Date  . Anemia   . Anginal pain ([place])   . Arthritis    "all over; doesn't seem to bother me much" (08/22/2013)  . Chronic lower back pain   . Dysrhythmia    "irregular heart beat"   . Exertional shortness of breath   . GERD (gastroesophageal reflux disease)   . Hypertension   . Hypothyroidism   . Migraines    "used to"  . Mixed hyperlipidemia   . Osteoporosis   . Paroxysmal supraventricular tachycardia ([place])   . Skin cancer    "couple taken off right leg" (08/22/2013)  SOCIAL HX:  Social History   Tobacco Use  . Smoking status: Never Smoker  . Smokeless tobacco: Never Used  Substance Use Topics  . Alcohol use: No    ALLERGIES:  Allergies  Allergen Reactions  . Codeine Other (See Comments)    "I feel like I'm floating"   . Gabapentin Other (See Comments)    Cannot be on for a good length of time- causes a lot of lethargy  . Tramadol Other (See Comments)    On MAR  .  Other Other (See Comments)    All pain meds cause lethargy and disorientation     PERTINENT MEDICATIONS:  Outpatient Encounter Medications as of 03/24/2020  Medication Sig  . aspirin EC 81 MG tablet Take 1 tablet (81 mg total) by mouth daily. (Patient not taking: Reported on 01/27/2020)  . benzonatate (TESSALON) 100 MG capsule Take 1 capsule (100 mg total) by mouth 3 (three) times daily as needed for cough. (Patient not taking: Reported on 01/18/2020)  . bimatoprost (LUMIGAN) 0.01 % SOLN Place 1 drop into both eyes at bedtime.  . brimonidine (ALPHAGAN) 0.15 % ophthalmic solution Place 1 drop into both eyes 3 (three) times daily.   . Calcium 600-200 MG-UNIT tablet Take 2 tablets by mouth daily.   . cloNIDine (CATAPRES) 0.1 MG tablet Take 0.1 mg by mouth daily as needed (for SBP>180).  . Colesevelam HCl (WELCHOL) 3.75 g PACK Take 3.75 g by mouth daily.  Marland Kitchen diltiazem (CARDIZEM CD) 120 MG 24 hr capsule Take 120 mg by mouth daily.   . dorzolamide-timolol (COSOPT) 22.3-6.8 MG/ML ophthalmic solution Place 1 drop into the left eye 2 (two) times daily.  . feeding supplement, ENSURE ENLIVE, (ENSURE ENLIVE) LIQD Take 237 mLs by mouth 2 (two) times daily between meals. (Patient not taking: Reported on 01/18/2020)  . ferrous sulfate 325 (65 FE) MG EC tablet Take 325 mg by mouth daily with breakfast.   . fexofenadine (ALLEGRA) 60 MG tablet Take 60 mg by mouth 2 (two) times daily.  Marland Kitchen lactose free nutrition (BOOST) LIQD Take 237 mLs by mouth daily.  Marland Kitchen levothyroxine (SYNTHROID) 25 MCG tablet Take 25 mcg by mouth every other day. Alternating with 28mcg  . levothyroxine (SYNTHROID, LEVOTHROID) 50 MCG tablet Take 50 mcg by mouth every other day.   . Lidocaine HCl (ASPERCREME LIDOCAINE) 4 % CREA Apply topically in the morning and at bedtime. To affected area of mid-back  . lisinopril (ZESTRIL) 20 MG tablet Take 20 mg by mouth daily.  Marland Kitchen loperamide (IMODIUM A-D) 2 MG tablet Take 2 mg by mouth daily as needed for diarrhea  or loose stools.  . meclizine (ANTIVERT) 25 MG tablet Take 25 mg by mouth 3 (three) times daily as needed for dizziness.  . metoprolol (LOPRESSOR) 50 MG tablet Take 50 mg by mouth 2 (two) times daily.  . mirtazapine (REMERON) 15 MG tablet Take 15 mg by mouth at bedtime.  . nitroGLYCERIN (NITROSTAT) 0.4 MG SL tablet Place 1 tablet (0.4 mg total) under the tongue every 5 (five) minutes as needed for chest pain.  . Omega-3 Fatty Acids (FISH OIL) 1000 MG CAPS Take 2,000 mg by mouth daily.   Marland Kitchen omeprazole (PRILOSEC) 20 MG capsule Take 20 mg by mouth daily.  . pantoprazole (PROTONIX) 40 MG tablet Take 1 tablet (40 mg total) by mouth 2 (two) times daily before a meal. Take 2 times daily for 2 weeks, then 1 tablet daily/ (Patient taking differently: Take 40 mg by mouth daily.  then 1 tablet daily)  . Probiotic Product (PROBIOTIC-10 PO) Take 1 capsule by mouth in the morning and at bedtime.  . sertraline (ZOLOFT) 100 MG tablet Take 100 mg by mouth daily.    No facility-administered encounter medications on file as of 03/24/2020.     Elleni Mozingo Jenetta Downer, NP

## 2020-03-25 DIAGNOSIS — D638 Anemia in other chronic diseases classified elsewhere: Secondary | ICD-10-CM | POA: Diagnosis not present

## 2020-03-25 DIAGNOSIS — N390 Urinary tract infection, site not specified: Secondary | ICD-10-CM | POA: Diagnosis not present

## 2020-03-25 DIAGNOSIS — Z79899 Other long term (current) drug therapy: Secondary | ICD-10-CM | POA: Diagnosis not present

## 2020-03-25 DIAGNOSIS — D649 Anemia, unspecified: Secondary | ICD-10-CM | POA: Diagnosis not present

## 2020-03-25 DIAGNOSIS — A048 Other specified bacterial intestinal infections: Secondary | ICD-10-CM | POA: Diagnosis not present

## 2020-03-25 DIAGNOSIS — R41841 Cognitive communication deficit: Secondary | ICD-10-CM | POA: Diagnosis not present

## 2020-03-25 DIAGNOSIS — R278 Other lack of coordination: Secondary | ICD-10-CM | POA: Diagnosis not present

## 2020-03-25 DIAGNOSIS — G459 Transient cerebral ischemic attack, unspecified: Secondary | ICD-10-CM | POA: Diagnosis not present

## 2020-03-25 DIAGNOSIS — I10 Essential (primary) hypertension: Secondary | ICD-10-CM | POA: Diagnosis not present

## 2020-03-25 DIAGNOSIS — M6281 Muscle weakness (generalized): Secondary | ICD-10-CM | POA: Diagnosis not present

## 2020-03-26 DIAGNOSIS — M6281 Muscle weakness (generalized): Secondary | ICD-10-CM | POA: Diagnosis not present

## 2020-03-26 DIAGNOSIS — A048 Other specified bacterial intestinal infections: Secondary | ICD-10-CM | POA: Diagnosis not present

## 2020-03-26 DIAGNOSIS — R278 Other lack of coordination: Secondary | ICD-10-CM | POA: Diagnosis not present

## 2020-03-26 DIAGNOSIS — R41841 Cognitive communication deficit: Secondary | ICD-10-CM | POA: Diagnosis not present

## 2020-03-26 DIAGNOSIS — D649 Anemia, unspecified: Secondary | ICD-10-CM | POA: Diagnosis not present

## 2020-03-26 DIAGNOSIS — D638 Anemia in other chronic diseases classified elsewhere: Secondary | ICD-10-CM | POA: Diagnosis not present

## 2020-03-26 DIAGNOSIS — Z20828 Contact with and (suspected) exposure to other viral communicable diseases: Secondary | ICD-10-CM | POA: Diagnosis not present

## 2020-03-27 DIAGNOSIS — D638 Anemia in other chronic diseases classified elsewhere: Secondary | ICD-10-CM | POA: Diagnosis not present

## 2020-03-27 DIAGNOSIS — R278 Other lack of coordination: Secondary | ICD-10-CM | POA: Diagnosis not present

## 2020-03-27 DIAGNOSIS — A048 Other specified bacterial intestinal infections: Secondary | ICD-10-CM | POA: Diagnosis not present

## 2020-03-27 DIAGNOSIS — D649 Anemia, unspecified: Secondary | ICD-10-CM | POA: Diagnosis not present

## 2020-03-27 DIAGNOSIS — M6281 Muscle weakness (generalized): Secondary | ICD-10-CM | POA: Diagnosis not present

## 2020-03-27 DIAGNOSIS — R41841 Cognitive communication deficit: Secondary | ICD-10-CM | POA: Diagnosis not present

## 2020-03-29 ENCOUNTER — Telehealth: Payer: Self-pay

## 2020-03-29 DIAGNOSIS — R278 Other lack of coordination: Secondary | ICD-10-CM | POA: Diagnosis not present

## 2020-03-29 DIAGNOSIS — A048 Other specified bacterial intestinal infections: Secondary | ICD-10-CM | POA: Diagnosis not present

## 2020-03-29 DIAGNOSIS — R41841 Cognitive communication deficit: Secondary | ICD-10-CM | POA: Diagnosis not present

## 2020-03-29 DIAGNOSIS — D649 Anemia, unspecified: Secondary | ICD-10-CM | POA: Diagnosis not present

## 2020-03-29 DIAGNOSIS — D638 Anemia in other chronic diseases classified elsewhere: Secondary | ICD-10-CM | POA: Diagnosis not present

## 2020-03-29 DIAGNOSIS — M6281 Muscle weakness (generalized): Secondary | ICD-10-CM | POA: Diagnosis not present

## 2020-03-29 NOTE — Telephone Encounter (Signed)
Received a message to call Varney Biles at [facility]. Patient currently under Palliative care but would like to transition patient to hospice services. Phone call returned to [facility]. Unable to speak to [facility]. Message left with contact information.

## 2020-03-30 DIAGNOSIS — D638 Anemia in other chronic diseases classified elsewhere: Secondary | ICD-10-CM | POA: Diagnosis not present

## 2020-03-30 DIAGNOSIS — W19XXXA Unspecified fall, initial encounter: Secondary | ICD-10-CM | POA: Diagnosis not present

## 2020-03-30 DIAGNOSIS — M25552 Pain in left hip: Secondary | ICD-10-CM | POA: Diagnosis not present

## 2020-03-30 DIAGNOSIS — R41841 Cognitive communication deficit: Secondary | ICD-10-CM | POA: Diagnosis not present

## 2020-03-30 DIAGNOSIS — A048 Other specified bacterial intestinal infections: Secondary | ICD-10-CM | POA: Diagnosis not present

## 2020-03-30 DIAGNOSIS — M6281 Muscle weakness (generalized): Secondary | ICD-10-CM | POA: Diagnosis not present

## 2020-03-30 DIAGNOSIS — D649 Anemia, unspecified: Secondary | ICD-10-CM | POA: Diagnosis not present

## 2020-03-30 DIAGNOSIS — R278 Other lack of coordination: Secondary | ICD-10-CM | POA: Diagnosis not present

## 2020-03-31 ENCOUNTER — Other Ambulatory Visit: Payer: Self-pay

## 2020-03-31 ENCOUNTER — Non-Acute Institutional Stay: Payer: Medicare Other | Admitting: Adult Health Nurse Practitioner

## 2020-03-31 DIAGNOSIS — Z515 Encounter for palliative care: Secondary | ICD-10-CM | POA: Diagnosis not present

## 2020-03-31 DIAGNOSIS — M6281 Muscle weakness (generalized): Secondary | ICD-10-CM | POA: Diagnosis not present

## 2020-03-31 DIAGNOSIS — D638 Anemia in other chronic diseases classified elsewhere: Secondary | ICD-10-CM | POA: Diagnosis not present

## 2020-03-31 DIAGNOSIS — I739 Peripheral vascular disease, unspecified: Secondary | ICD-10-CM

## 2020-03-31 DIAGNOSIS — R278 Other lack of coordination: Secondary | ICD-10-CM | POA: Diagnosis not present

## 2020-03-31 DIAGNOSIS — R41841 Cognitive communication deficit: Secondary | ICD-10-CM | POA: Diagnosis not present

## 2020-03-31 DIAGNOSIS — D649 Anemia, unspecified: Secondary | ICD-10-CM | POA: Diagnosis not present

## 2020-03-31 DIAGNOSIS — A048 Other specified bacterial intestinal infections: Secondary | ICD-10-CM | POA: Diagnosis not present

## 2020-03-31 NOTE — Progress Notes (Signed)
[facility] Consult Note Telephone: [phone]  Fax: [phone]  PATIENT NAME: [name] DOB: Jul 22, 1921 MRN: [id number]  PRIMARY CARE PROVIDER:   Wenda Low, MD  REFERRING PROVIDER:  Wenda Low, MD [address] [place],  [place] [address]  RESPONSIBLE PARTY:  Self [phone] Emergency contact - DaughterTye [place] [phone]      RECOMMENDATIONS and PLAN:  1.  Advanced care planning.  Patient is DNR.  Spoke with daughter via phone and updated on today's visit.  Was asked to see patient today as she had some changes over the weekend.  Over the weekend she was not eating or drinking and at one point was very weak and almost unresponsive and had to be lifted into bed.  Daughter did state that on Friday she was started on antibiotic for UTI.  Patient was better on Monday but did have a fall.  Was complaining of back pain after the fall and x-rays did not show any acute findings.  States that she is does still have some back pain today which is relieved with her regular pain medications.  Today patient appears to be back at baseline and is up in her wheelchair eating her breakfast this morning.  Patient is alert and oriented x2 and is able to answer all questions appropriately and engage in conversation.  Continue antibiotic therapy for UTI until completion.  Continue supportive care at facility.  Palliative will continue to monitor for symptom management/decline and make recommendations as needed.  PMPM team will continue to follow this patient  I spent 30 minutes providing this consultation,  from 10:00 to 10:30 including time spent with patient/family, chart review, provider coordination, documentation. More than 50% of the time in this consultation was spent coordinating communication.   HISTORY OF PRESENT ILLNESS:  [name] is a 86 y.o. year old female with multiple medical  problems including GERD chronic low back pain, HTN Hypothyroidism. Palliative Care was asked to help address goals of care. Patient denies headaches, increased shortness of breath or cough, N/V/D, constipation, dysuria, hematuria.    CODE STATUS: DNR  PPS: 40% HOSPICE ELIGIBILITY/DIAGNOSIS: TBD  PHYSICAL EXAM:  HR 74 O2 97% on RA General: NAD, frail appearing, thin Cardiovascular:  Irregularly regular rate and rhythm Pulmonary: lung sounds clear; normal respiratory effort Abdomen: soft, nontender, + bowel sounds GU: no suprapubic tenderness Extremities: trace edema, no joint deformities Skin: no rashes on exposed skin Neurological: Weakness; patient has confusion that comes and goes  PAST MEDICAL HISTORY:  Past Medical History:  Diagnosis Date  . Anemia   . Anginal pain ([place])   . Arthritis    "all over; doesn't seem to bother me much" (08/22/2013)  . Chronic lower back pain   . Dysrhythmia    "irregular heart beat"   . Exertional shortness of breath   . GERD (gastroesophageal reflux disease)   . Hypertension   . Hypothyroidism   . Migraines    "used to"  . Mixed hyperlipidemia   . Osteoporosis   . Paroxysmal supraventricular tachycardia ([place])   . Skin cancer    "couple taken off right leg" (08/22/2013)    SOCIAL HX:  Social History   Tobacco Use  . Smoking status: Never Smoker  . Smokeless tobacco: Never Used  Substance Use Topics  . Alcohol use: No    ALLERGIES:  Allergies  Allergen Reactions  . Codeine Other (See  Comments)    "I feel like I'm floating"   . Gabapentin Other (See Comments)    Cannot be on for a good length of time- causes a lot of lethargy  . Tramadol Other (See Comments)    On MAR  . Other Other (See Comments)    All pain meds cause lethargy and disorientation     PERTINENT MEDICATIONS:  Outpatient Encounter Medications as of 03/31/2020  Medication Sig  . aspirin EC 81 MG tablet Take 1 tablet (81 mg total) by mouth daily. (Patient not  taking: Reported on 01/27/2020)  . benzonatate (TESSALON) 100 MG capsule Take 1 capsule (100 mg total) by mouth 3 (three) times daily as needed for cough. (Patient not taking: Reported on 01/18/2020)  . bimatoprost (LUMIGAN) 0.01 % SOLN Place 1 drop into both eyes at bedtime.  . brimonidine (ALPHAGAN) 0.15 % ophthalmic solution Place 1 drop into both eyes 3 (three) times daily.   . Calcium 600-200 MG-UNIT tablet Take 2 tablets by mouth daily.   . cloNIDine (CATAPRES) 0.1 MG tablet Take 0.1 mg by mouth daily as needed (for SBP>180).  . Colesevelam HCl (WELCHOL) 3.75 g PACK Take 3.75 g by mouth daily.  Marland Kitchen diltiazem (CARDIZEM CD) 120 MG 24 hr capsule Take 120 mg by mouth daily.   . dorzolamide-timolol (COSOPT) 22.3-6.8 MG/ML ophthalmic solution Place 1 drop into the left eye 2 (two) times daily.  . feeding supplement, ENSURE ENLIVE, (ENSURE ENLIVE) LIQD Take 237 mLs by mouth 2 (two) times daily between meals. (Patient not taking: Reported on 01/18/2020)  . ferrous sulfate 325 (65 FE) MG EC tablet Take 325 mg by mouth daily with breakfast.   . fexofenadine (ALLEGRA) 60 MG tablet Take 60 mg by mouth 2 (two) times daily.  Marland Kitchen lactose free nutrition (BOOST) LIQD Take 237 mLs by mouth daily.  Marland Kitchen levothyroxine (SYNTHROID) 25 MCG tablet Take 25 mcg by mouth every other day. Alternating with 34mcg  . levothyroxine (SYNTHROID, LEVOTHROID) 50 MCG tablet Take 50 mcg by mouth every other day.   . Lidocaine HCl (ASPERCREME LIDOCAINE) 4 % CREA Apply topically in the morning and at bedtime. To affected area of mid-back  . lisinopril (ZESTRIL) 20 MG tablet Take 20 mg by mouth daily.  Marland Kitchen loperamide (IMODIUM A-D) 2 MG tablet Take 2 mg by mouth daily as needed for diarrhea or loose stools.  . meclizine (ANTIVERT) 25 MG tablet Take 25 mg by mouth 3 (three) times daily as needed for dizziness.  . metoprolol (LOPRESSOR) 50 MG tablet Take 50 mg by mouth 2 (two) times daily.  . mirtazapine (REMERON) 15 MG tablet Take 15 mg by mouth  at bedtime.  . nitroGLYCERIN (NITROSTAT) 0.4 MG SL tablet Place 1 tablet (0.4 mg total) under the tongue every 5 (five) minutes as needed for chest pain.  . Omega-3 Fatty Acids (FISH OIL) 1000 MG CAPS Take 2,000 mg by mouth daily.   Marland Kitchen omeprazole (PRILOSEC) 20 MG capsule Take 20 mg by mouth daily.  . pantoprazole (PROTONIX) 40 MG tablet Take 1 tablet (40 mg total) by mouth 2 (two) times daily before a meal. Take 2 times daily for 2 weeks, then 1 tablet daily/ (Patient taking differently: Take 40 mg by mouth daily. then 1 tablet daily)  . Probiotic Product (PROBIOTIC-10 PO) Take 1 capsule by mouth in the morning and at bedtime.  . sertraline (ZOLOFT) 100 MG tablet Take 100 mg by mouth daily.    No facility-administered encounter medications on file as  of 03/31/2020.     Judythe Postema Jenetta Downer, NP

## 2020-04-01 DIAGNOSIS — R278 Other lack of coordination: Secondary | ICD-10-CM | POA: Diagnosis not present

## 2020-04-01 DIAGNOSIS — M6281 Muscle weakness (generalized): Secondary | ICD-10-CM | POA: Diagnosis not present

## 2020-04-01 DIAGNOSIS — R41841 Cognitive communication deficit: Secondary | ICD-10-CM | POA: Diagnosis not present

## 2020-04-01 DIAGNOSIS — A048 Other specified bacterial intestinal infections: Secondary | ICD-10-CM | POA: Diagnosis not present

## 2020-04-01 DIAGNOSIS — D649 Anemia, unspecified: Secondary | ICD-10-CM | POA: Diagnosis not present

## 2020-04-01 DIAGNOSIS — D638 Anemia in other chronic diseases classified elsewhere: Secondary | ICD-10-CM | POA: Diagnosis not present

## 2020-04-02 DIAGNOSIS — U071 COVID-19: Secondary | ICD-10-CM | POA: Diagnosis not present

## 2020-04-03 DIAGNOSIS — M6281 Muscle weakness (generalized): Secondary | ICD-10-CM | POA: Diagnosis not present

## 2020-04-03 DIAGNOSIS — D649 Anemia, unspecified: Secondary | ICD-10-CM | POA: Diagnosis not present

## 2020-04-03 DIAGNOSIS — R278 Other lack of coordination: Secondary | ICD-10-CM | POA: Diagnosis not present

## 2020-04-03 DIAGNOSIS — R41841 Cognitive communication deficit: Secondary | ICD-10-CM | POA: Diagnosis not present

## 2020-04-03 DIAGNOSIS — A048 Other specified bacterial intestinal infections: Secondary | ICD-10-CM | POA: Diagnosis not present

## 2020-04-03 DIAGNOSIS — D638 Anemia in other chronic diseases classified elsewhere: Secondary | ICD-10-CM | POA: Diagnosis not present

## 2020-04-05 DIAGNOSIS — F330 Major depressive disorder, recurrent, mild: Secondary | ICD-10-CM | POA: Diagnosis not present

## 2020-04-05 DIAGNOSIS — R634 Abnormal weight loss: Secondary | ICD-10-CM | POA: Diagnosis not present

## 2020-04-05 DIAGNOSIS — F39 Unspecified mood [affective] disorder: Secondary | ICD-10-CM | POA: Diagnosis not present

## 2020-04-05 DIAGNOSIS — R41841 Cognitive communication deficit: Secondary | ICD-10-CM | POA: Diagnosis not present

## 2020-04-05 DIAGNOSIS — D638 Anemia in other chronic diseases classified elsewhere: Secondary | ICD-10-CM | POA: Diagnosis not present

## 2020-04-05 DIAGNOSIS — M6281 Muscle weakness (generalized): Secondary | ICD-10-CM | POA: Diagnosis not present

## 2020-04-05 DIAGNOSIS — A048 Other specified bacterial intestinal infections: Secondary | ICD-10-CM | POA: Diagnosis not present

## 2020-04-05 DIAGNOSIS — D649 Anemia, unspecified: Secondary | ICD-10-CM | POA: Diagnosis not present

## 2020-04-05 DIAGNOSIS — R278 Other lack of coordination: Secondary | ICD-10-CM | POA: Diagnosis not present

## 2020-04-06 DIAGNOSIS — R278 Other lack of coordination: Secondary | ICD-10-CM | POA: Diagnosis not present

## 2020-04-06 DIAGNOSIS — A048 Other specified bacterial intestinal infections: Secondary | ICD-10-CM | POA: Diagnosis not present

## 2020-04-06 DIAGNOSIS — D638 Anemia in other chronic diseases classified elsewhere: Secondary | ICD-10-CM | POA: Diagnosis not present

## 2020-04-06 DIAGNOSIS — M6281 Muscle weakness (generalized): Secondary | ICD-10-CM | POA: Diagnosis not present

## 2020-04-06 DIAGNOSIS — R41841 Cognitive communication deficit: Secondary | ICD-10-CM | POA: Diagnosis not present

## 2020-04-06 DIAGNOSIS — D649 Anemia, unspecified: Secondary | ICD-10-CM | POA: Diagnosis not present

## 2020-04-07 DIAGNOSIS — R41841 Cognitive communication deficit: Secondary | ICD-10-CM | POA: Diagnosis not present

## 2020-04-07 DIAGNOSIS — M6281 Muscle weakness (generalized): Secondary | ICD-10-CM | POA: Diagnosis not present

## 2020-04-07 DIAGNOSIS — D649 Anemia, unspecified: Secondary | ICD-10-CM | POA: Diagnosis not present

## 2020-04-07 DIAGNOSIS — D638 Anemia in other chronic diseases classified elsewhere: Secondary | ICD-10-CM | POA: Diagnosis not present

## 2020-04-07 DIAGNOSIS — R278 Other lack of coordination: Secondary | ICD-10-CM | POA: Diagnosis not present

## 2020-04-07 DIAGNOSIS — A048 Other specified bacterial intestinal infections: Secondary | ICD-10-CM | POA: Diagnosis not present

## 2020-04-08 DIAGNOSIS — D638 Anemia in other chronic diseases classified elsewhere: Secondary | ICD-10-CM | POA: Diagnosis not present

## 2020-04-08 DIAGNOSIS — A048 Other specified bacterial intestinal infections: Secondary | ICD-10-CM | POA: Diagnosis not present

## 2020-04-08 DIAGNOSIS — D649 Anemia, unspecified: Secondary | ICD-10-CM | POA: Diagnosis not present

## 2020-04-08 DIAGNOSIS — R41841 Cognitive communication deficit: Secondary | ICD-10-CM | POA: Diagnosis not present

## 2020-04-08 DIAGNOSIS — R278 Other lack of coordination: Secondary | ICD-10-CM | POA: Diagnosis not present

## 2020-04-08 DIAGNOSIS — M6281 Muscle weakness (generalized): Secondary | ICD-10-CM | POA: Diagnosis not present

## 2020-04-09 DIAGNOSIS — U071 COVID-19: Secondary | ICD-10-CM | POA: Diagnosis not present

## 2020-04-10 DIAGNOSIS — D649 Anemia, unspecified: Secondary | ICD-10-CM | POA: Diagnosis not present

## 2020-04-10 DIAGNOSIS — R278 Other lack of coordination: Secondary | ICD-10-CM | POA: Diagnosis not present

## 2020-04-10 DIAGNOSIS — M6281 Muscle weakness (generalized): Secondary | ICD-10-CM | POA: Diagnosis not present

## 2020-04-10 DIAGNOSIS — R41841 Cognitive communication deficit: Secondary | ICD-10-CM | POA: Diagnosis not present

## 2020-04-10 DIAGNOSIS — A048 Other specified bacterial intestinal infections: Secondary | ICD-10-CM | POA: Diagnosis not present

## 2020-04-10 DIAGNOSIS — D638 Anemia in other chronic diseases classified elsewhere: Secondary | ICD-10-CM | POA: Diagnosis not present

## 2020-04-12 DIAGNOSIS — M6281 Muscle weakness (generalized): Secondary | ICD-10-CM | POA: Diagnosis not present

## 2020-04-12 DIAGNOSIS — R278 Other lack of coordination: Secondary | ICD-10-CM | POA: Diagnosis not present

## 2020-04-12 DIAGNOSIS — D638 Anemia in other chronic diseases classified elsewhere: Secondary | ICD-10-CM | POA: Diagnosis not present

## 2020-04-12 DIAGNOSIS — A048 Other specified bacterial intestinal infections: Secondary | ICD-10-CM | POA: Diagnosis not present

## 2020-04-12 DIAGNOSIS — D649 Anemia, unspecified: Secondary | ICD-10-CM | POA: Diagnosis not present

## 2020-04-12 DIAGNOSIS — R41841 Cognitive communication deficit: Secondary | ICD-10-CM | POA: Diagnosis not present

## 2020-04-13 DIAGNOSIS — D638 Anemia in other chronic diseases classified elsewhere: Secondary | ICD-10-CM | POA: Diagnosis not present

## 2020-04-13 DIAGNOSIS — A048 Other specified bacterial intestinal infections: Secondary | ICD-10-CM | POA: Diagnosis not present

## 2020-04-13 DIAGNOSIS — D649 Anemia, unspecified: Secondary | ICD-10-CM | POA: Diagnosis not present

## 2020-04-13 DIAGNOSIS — R41841 Cognitive communication deficit: Secondary | ICD-10-CM | POA: Diagnosis not present

## 2020-04-13 DIAGNOSIS — R278 Other lack of coordination: Secondary | ICD-10-CM | POA: Diagnosis not present

## 2020-04-13 DIAGNOSIS — M6281 Muscle weakness (generalized): Secondary | ICD-10-CM | POA: Diagnosis not present

## 2020-04-14 DIAGNOSIS — M6281 Muscle weakness (generalized): Secondary | ICD-10-CM | POA: Diagnosis not present

## 2020-04-14 DIAGNOSIS — D649 Anemia, unspecified: Secondary | ICD-10-CM | POA: Diagnosis not present

## 2020-04-14 DIAGNOSIS — D638 Anemia in other chronic diseases classified elsewhere: Secondary | ICD-10-CM | POA: Diagnosis not present

## 2020-04-14 DIAGNOSIS — R41841 Cognitive communication deficit: Secondary | ICD-10-CM | POA: Diagnosis not present

## 2020-04-14 DIAGNOSIS — R278 Other lack of coordination: Secondary | ICD-10-CM | POA: Diagnosis not present

## 2020-04-14 DIAGNOSIS — A048 Other specified bacterial intestinal infections: Secondary | ICD-10-CM | POA: Diagnosis not present

## 2020-04-15 DIAGNOSIS — D638 Anemia in other chronic diseases classified elsewhere: Secondary | ICD-10-CM | POA: Diagnosis not present

## 2020-04-15 DIAGNOSIS — M6281 Muscle weakness (generalized): Secondary | ICD-10-CM | POA: Diagnosis not present

## 2020-04-15 DIAGNOSIS — R41841 Cognitive communication deficit: Secondary | ICD-10-CM | POA: Diagnosis not present

## 2020-04-15 DIAGNOSIS — D649 Anemia, unspecified: Secondary | ICD-10-CM | POA: Diagnosis not present

## 2020-04-15 DIAGNOSIS — R278 Other lack of coordination: Secondary | ICD-10-CM | POA: Diagnosis not present

## 2020-04-15 DIAGNOSIS — A048 Other specified bacterial intestinal infections: Secondary | ICD-10-CM | POA: Diagnosis not present

## 2020-04-16 DIAGNOSIS — U071 COVID-19: Secondary | ICD-10-CM | POA: Diagnosis not present

## 2020-04-17 DIAGNOSIS — R278 Other lack of coordination: Secondary | ICD-10-CM | POA: Diagnosis not present

## 2020-04-17 DIAGNOSIS — M6281 Muscle weakness (generalized): Secondary | ICD-10-CM | POA: Diagnosis not present

## 2020-04-17 DIAGNOSIS — D638 Anemia in other chronic diseases classified elsewhere: Secondary | ICD-10-CM | POA: Diagnosis not present

## 2020-04-17 DIAGNOSIS — R41841 Cognitive communication deficit: Secondary | ICD-10-CM | POA: Diagnosis not present

## 2020-04-17 DIAGNOSIS — D649 Anemia, unspecified: Secondary | ICD-10-CM | POA: Diagnosis not present

## 2020-04-17 DIAGNOSIS — A048 Other specified bacterial intestinal infections: Secondary | ICD-10-CM | POA: Diagnosis not present

## 2020-04-18 DIAGNOSIS — R41841 Cognitive communication deficit: Secondary | ICD-10-CM | POA: Diagnosis not present

## 2020-04-18 DIAGNOSIS — D649 Anemia, unspecified: Secondary | ICD-10-CM | POA: Diagnosis not present

## 2020-04-18 DIAGNOSIS — D638 Anemia in other chronic diseases classified elsewhere: Secondary | ICD-10-CM | POA: Diagnosis not present

## 2020-04-18 DIAGNOSIS — R278 Other lack of coordination: Secondary | ICD-10-CM | POA: Diagnosis not present

## 2020-04-18 DIAGNOSIS — M6281 Muscle weakness (generalized): Secondary | ICD-10-CM | POA: Diagnosis not present

## 2020-04-18 DIAGNOSIS — A048 Other specified bacterial intestinal infections: Secondary | ICD-10-CM | POA: Diagnosis not present

## 2020-04-19 DIAGNOSIS — R278 Other lack of coordination: Secondary | ICD-10-CM | POA: Diagnosis not present

## 2020-04-19 DIAGNOSIS — A048 Other specified bacterial intestinal infections: Secondary | ICD-10-CM | POA: Diagnosis not present

## 2020-04-19 DIAGNOSIS — D649 Anemia, unspecified: Secondary | ICD-10-CM | POA: Diagnosis not present

## 2020-04-19 DIAGNOSIS — M6281 Muscle weakness (generalized): Secondary | ICD-10-CM | POA: Diagnosis not present

## 2020-04-19 DIAGNOSIS — D638 Anemia in other chronic diseases classified elsewhere: Secondary | ICD-10-CM | POA: Diagnosis not present

## 2020-04-19 DIAGNOSIS — R41841 Cognitive communication deficit: Secondary | ICD-10-CM | POA: Diagnosis not present

## 2020-04-20 DIAGNOSIS — A048 Other specified bacterial intestinal infections: Secondary | ICD-10-CM | POA: Diagnosis not present

## 2020-04-20 DIAGNOSIS — M6281 Muscle weakness (generalized): Secondary | ICD-10-CM | POA: Diagnosis not present

## 2020-04-20 DIAGNOSIS — R41841 Cognitive communication deficit: Secondary | ICD-10-CM | POA: Diagnosis not present

## 2020-04-20 DIAGNOSIS — D649 Anemia, unspecified: Secondary | ICD-10-CM | POA: Diagnosis not present

## 2020-04-20 DIAGNOSIS — D638 Anemia in other chronic diseases classified elsewhere: Secondary | ICD-10-CM | POA: Diagnosis not present

## 2020-04-20 DIAGNOSIS — R278 Other lack of coordination: Secondary | ICD-10-CM | POA: Diagnosis not present

## 2020-04-21 DIAGNOSIS — R41841 Cognitive communication deficit: Secondary | ICD-10-CM | POA: Diagnosis not present

## 2020-04-21 DIAGNOSIS — A048 Other specified bacterial intestinal infections: Secondary | ICD-10-CM | POA: Diagnosis not present

## 2020-04-21 DIAGNOSIS — M6281 Muscle weakness (generalized): Secondary | ICD-10-CM | POA: Diagnosis not present

## 2020-04-21 DIAGNOSIS — I10 Essential (primary) hypertension: Secondary | ICD-10-CM | POA: Diagnosis not present

## 2020-04-21 DIAGNOSIS — R278 Other lack of coordination: Secondary | ICD-10-CM | POA: Diagnosis not present

## 2020-04-21 DIAGNOSIS — D638 Anemia in other chronic diseases classified elsewhere: Secondary | ICD-10-CM | POA: Diagnosis not present

## 2020-04-21 DIAGNOSIS — D649 Anemia, unspecified: Secondary | ICD-10-CM | POA: Diagnosis not present

## 2020-04-22 DIAGNOSIS — D649 Anemia, unspecified: Secondary | ICD-10-CM | POA: Diagnosis not present

## 2020-04-22 DIAGNOSIS — R278 Other lack of coordination: Secondary | ICD-10-CM | POA: Diagnosis not present

## 2020-04-22 DIAGNOSIS — A048 Other specified bacterial intestinal infections: Secondary | ICD-10-CM | POA: Diagnosis not present

## 2020-04-22 DIAGNOSIS — D638 Anemia in other chronic diseases classified elsewhere: Secondary | ICD-10-CM | POA: Diagnosis not present

## 2020-04-22 DIAGNOSIS — M6281 Muscle weakness (generalized): Secondary | ICD-10-CM | POA: Diagnosis not present

## 2020-04-22 DIAGNOSIS — R41841 Cognitive communication deficit: Secondary | ICD-10-CM | POA: Diagnosis not present

## 2020-04-23 DIAGNOSIS — G459 Transient cerebral ischemic attack, unspecified: Secondary | ICD-10-CM | POA: Diagnosis not present

## 2020-04-23 DIAGNOSIS — A048 Other specified bacterial intestinal infections: Secondary | ICD-10-CM | POA: Diagnosis not present

## 2020-04-23 DIAGNOSIS — Z20822 Contact with and (suspected) exposure to covid-19: Secondary | ICD-10-CM | POA: Diagnosis not present

## 2020-04-23 DIAGNOSIS — K922 Gastrointestinal hemorrhage, unspecified: Secondary | ICD-10-CM | POA: Diagnosis not present

## 2020-04-23 DIAGNOSIS — M6281 Muscle weakness (generalized): Secondary | ICD-10-CM | POA: Diagnosis not present

## 2020-04-24 DIAGNOSIS — M6281 Muscle weakness (generalized): Secondary | ICD-10-CM | POA: Diagnosis not present

## 2020-04-24 DIAGNOSIS — A048 Other specified bacterial intestinal infections: Secondary | ICD-10-CM | POA: Diagnosis not present

## 2020-04-24 DIAGNOSIS — D638 Anemia in other chronic diseases classified elsewhere: Secondary | ICD-10-CM | POA: Diagnosis not present

## 2020-04-24 DIAGNOSIS — R278 Other lack of coordination: Secondary | ICD-10-CM | POA: Diagnosis not present

## 2020-04-24 DIAGNOSIS — R41841 Cognitive communication deficit: Secondary | ICD-10-CM | POA: Diagnosis not present

## 2020-04-24 DIAGNOSIS — D649 Anemia, unspecified: Secondary | ICD-10-CM | POA: Diagnosis not present

## 2020-04-26 DIAGNOSIS — M6281 Muscle weakness (generalized): Secondary | ICD-10-CM | POA: Diagnosis not present

## 2020-04-26 DIAGNOSIS — D638 Anemia in other chronic diseases classified elsewhere: Secondary | ICD-10-CM | POA: Diagnosis not present

## 2020-04-26 DIAGNOSIS — R278 Other lack of coordination: Secondary | ICD-10-CM | POA: Diagnosis not present

## 2020-04-26 DIAGNOSIS — A048 Other specified bacterial intestinal infections: Secondary | ICD-10-CM | POA: Diagnosis not present

## 2020-04-26 DIAGNOSIS — D649 Anemia, unspecified: Secondary | ICD-10-CM | POA: Diagnosis not present

## 2020-04-26 DIAGNOSIS — R41841 Cognitive communication deficit: Secondary | ICD-10-CM | POA: Diagnosis not present

## 2020-04-27 DIAGNOSIS — R41841 Cognitive communication deficit: Secondary | ICD-10-CM | POA: Diagnosis not present

## 2020-04-27 DIAGNOSIS — M6281 Muscle weakness (generalized): Secondary | ICD-10-CM | POA: Diagnosis not present

## 2020-04-27 DIAGNOSIS — A048 Other specified bacterial intestinal infections: Secondary | ICD-10-CM | POA: Diagnosis not present

## 2020-04-27 DIAGNOSIS — R278 Other lack of coordination: Secondary | ICD-10-CM | POA: Diagnosis not present

## 2020-04-27 DIAGNOSIS — D649 Anemia, unspecified: Secondary | ICD-10-CM | POA: Diagnosis not present

## 2020-04-27 DIAGNOSIS — D638 Anemia in other chronic diseases classified elsewhere: Secondary | ICD-10-CM | POA: Diagnosis not present

## 2020-04-28 DIAGNOSIS — D638 Anemia in other chronic diseases classified elsewhere: Secondary | ICD-10-CM | POA: Diagnosis not present

## 2020-04-28 DIAGNOSIS — M6281 Muscle weakness (generalized): Secondary | ICD-10-CM | POA: Diagnosis not present

## 2020-04-28 DIAGNOSIS — R41841 Cognitive communication deficit: Secondary | ICD-10-CM | POA: Diagnosis not present

## 2020-04-28 DIAGNOSIS — R278 Other lack of coordination: Secondary | ICD-10-CM | POA: Diagnosis not present

## 2020-04-28 DIAGNOSIS — A048 Other specified bacterial intestinal infections: Secondary | ICD-10-CM | POA: Diagnosis not present

## 2020-04-28 DIAGNOSIS — D649 Anemia, unspecified: Secondary | ICD-10-CM | POA: Diagnosis not present

## 2020-04-29 DIAGNOSIS — A048 Other specified bacterial intestinal infections: Secondary | ICD-10-CM | POA: Diagnosis not present

## 2020-04-29 DIAGNOSIS — D649 Anemia, unspecified: Secondary | ICD-10-CM | POA: Diagnosis not present

## 2020-04-29 DIAGNOSIS — D638 Anemia in other chronic diseases classified elsewhere: Secondary | ICD-10-CM | POA: Diagnosis not present

## 2020-04-29 DIAGNOSIS — M6281 Muscle weakness (generalized): Secondary | ICD-10-CM | POA: Diagnosis not present

## 2020-04-29 DIAGNOSIS — R41841 Cognitive communication deficit: Secondary | ICD-10-CM | POA: Diagnosis not present

## 2020-04-29 DIAGNOSIS — R278 Other lack of coordination: Secondary | ICD-10-CM | POA: Diagnosis not present

## 2020-04-30 DIAGNOSIS — Z03818 Encounter for observation for suspected exposure to other biological agents ruled out: Secondary | ICD-10-CM | POA: Diagnosis not present

## 2020-05-03 DIAGNOSIS — D638 Anemia in other chronic diseases classified elsewhere: Secondary | ICD-10-CM | POA: Diagnosis not present

## 2020-05-03 DIAGNOSIS — F330 Major depressive disorder, recurrent, mild: Secondary | ICD-10-CM | POA: Diagnosis not present

## 2020-05-03 DIAGNOSIS — A048 Other specified bacterial intestinal infections: Secondary | ICD-10-CM | POA: Diagnosis not present

## 2020-05-03 DIAGNOSIS — M6281 Muscle weakness (generalized): Secondary | ICD-10-CM | POA: Diagnosis not present

## 2020-05-03 DIAGNOSIS — F39 Unspecified mood [affective] disorder: Secondary | ICD-10-CM | POA: Diagnosis not present

## 2020-05-03 DIAGNOSIS — R278 Other lack of coordination: Secondary | ICD-10-CM | POA: Diagnosis not present

## 2020-05-03 DIAGNOSIS — D649 Anemia, unspecified: Secondary | ICD-10-CM | POA: Diagnosis not present

## 2020-05-03 DIAGNOSIS — R41841 Cognitive communication deficit: Secondary | ICD-10-CM | POA: Diagnosis not present

## 2020-05-03 DIAGNOSIS — R634 Abnormal weight loss: Secondary | ICD-10-CM | POA: Diagnosis not present

## 2020-05-04 DIAGNOSIS — M6281 Muscle weakness (generalized): Secondary | ICD-10-CM | POA: Diagnosis not present

## 2020-05-04 DIAGNOSIS — A048 Other specified bacterial intestinal infections: Secondary | ICD-10-CM | POA: Diagnosis not present

## 2020-05-04 DIAGNOSIS — R41841 Cognitive communication deficit: Secondary | ICD-10-CM | POA: Diagnosis not present

## 2020-05-04 DIAGNOSIS — D638 Anemia in other chronic diseases classified elsewhere: Secondary | ICD-10-CM | POA: Diagnosis not present

## 2020-05-04 DIAGNOSIS — R278 Other lack of coordination: Secondary | ICD-10-CM | POA: Diagnosis not present

## 2020-05-04 DIAGNOSIS — D649 Anemia, unspecified: Secondary | ICD-10-CM | POA: Diagnosis not present

## 2020-05-05 DIAGNOSIS — D649 Anemia, unspecified: Secondary | ICD-10-CM | POA: Diagnosis not present

## 2020-05-05 DIAGNOSIS — R41841 Cognitive communication deficit: Secondary | ICD-10-CM | POA: Diagnosis not present

## 2020-05-05 DIAGNOSIS — M6281 Muscle weakness (generalized): Secondary | ICD-10-CM | POA: Diagnosis not present

## 2020-05-05 DIAGNOSIS — D638 Anemia in other chronic diseases classified elsewhere: Secondary | ICD-10-CM | POA: Diagnosis not present

## 2020-05-05 DIAGNOSIS — A048 Other specified bacterial intestinal infections: Secondary | ICD-10-CM | POA: Diagnosis not present

## 2020-05-05 DIAGNOSIS — R278 Other lack of coordination: Secondary | ICD-10-CM | POA: Diagnosis not present

## 2020-05-06 DIAGNOSIS — D649 Anemia, unspecified: Secondary | ICD-10-CM | POA: Diagnosis not present

## 2020-05-06 DIAGNOSIS — R41841 Cognitive communication deficit: Secondary | ICD-10-CM | POA: Diagnosis not present

## 2020-05-06 DIAGNOSIS — M6281 Muscle weakness (generalized): Secondary | ICD-10-CM | POA: Diagnosis not present

## 2020-05-06 DIAGNOSIS — A048 Other specified bacterial intestinal infections: Secondary | ICD-10-CM | POA: Diagnosis not present

## 2020-05-06 DIAGNOSIS — R278 Other lack of coordination: Secondary | ICD-10-CM | POA: Diagnosis not present

## 2020-05-06 DIAGNOSIS — D638 Anemia in other chronic diseases classified elsewhere: Secondary | ICD-10-CM | POA: Diagnosis not present

## 2020-05-07 DIAGNOSIS — Z03818 Encounter for observation for suspected exposure to other biological agents ruled out: Secondary | ICD-10-CM | POA: Diagnosis not present

## 2020-05-07 DIAGNOSIS — D649 Anemia, unspecified: Secondary | ICD-10-CM | POA: Diagnosis not present

## 2020-05-07 DIAGNOSIS — R278 Other lack of coordination: Secondary | ICD-10-CM | POA: Diagnosis not present

## 2020-05-07 DIAGNOSIS — A048 Other specified bacterial intestinal infections: Secondary | ICD-10-CM | POA: Diagnosis not present

## 2020-05-07 DIAGNOSIS — D638 Anemia in other chronic diseases classified elsewhere: Secondary | ICD-10-CM | POA: Diagnosis not present

## 2020-05-07 DIAGNOSIS — R41841 Cognitive communication deficit: Secondary | ICD-10-CM | POA: Diagnosis not present

## 2020-05-07 DIAGNOSIS — M6281 Muscle weakness (generalized): Secondary | ICD-10-CM | POA: Diagnosis not present

## 2020-05-11 DIAGNOSIS — D638 Anemia in other chronic diseases classified elsewhere: Secondary | ICD-10-CM | POA: Diagnosis not present

## 2020-05-11 DIAGNOSIS — D649 Anemia, unspecified: Secondary | ICD-10-CM | POA: Diagnosis not present

## 2020-05-11 DIAGNOSIS — R41841 Cognitive communication deficit: Secondary | ICD-10-CM | POA: Diagnosis not present

## 2020-05-11 DIAGNOSIS — A048 Other specified bacterial intestinal infections: Secondary | ICD-10-CM | POA: Diagnosis not present

## 2020-05-11 DIAGNOSIS — M6281 Muscle weakness (generalized): Secondary | ICD-10-CM | POA: Diagnosis not present

## 2020-05-11 DIAGNOSIS — R278 Other lack of coordination: Secondary | ICD-10-CM | POA: Diagnosis not present

## 2020-05-13 DIAGNOSIS — D638 Anemia in other chronic diseases classified elsewhere: Secondary | ICD-10-CM | POA: Diagnosis not present

## 2020-05-13 DIAGNOSIS — R278 Other lack of coordination: Secondary | ICD-10-CM | POA: Diagnosis not present

## 2020-05-13 DIAGNOSIS — D649 Anemia, unspecified: Secondary | ICD-10-CM | POA: Diagnosis not present

## 2020-05-13 DIAGNOSIS — A048 Other specified bacterial intestinal infections: Secondary | ICD-10-CM | POA: Diagnosis not present

## 2020-05-13 DIAGNOSIS — M6281 Muscle weakness (generalized): Secondary | ICD-10-CM | POA: Diagnosis not present

## 2020-05-13 DIAGNOSIS — R41841 Cognitive communication deficit: Secondary | ICD-10-CM | POA: Diagnosis not present

## 2020-05-17 DIAGNOSIS — R278 Other lack of coordination: Secondary | ICD-10-CM | POA: Diagnosis not present

## 2020-05-17 DIAGNOSIS — F39 Unspecified mood [affective] disorder: Secondary | ICD-10-CM | POA: Diagnosis not present

## 2020-05-17 DIAGNOSIS — D638 Anemia in other chronic diseases classified elsewhere: Secondary | ICD-10-CM | POA: Diagnosis not present

## 2020-05-17 DIAGNOSIS — A048 Other specified bacterial intestinal infections: Secondary | ICD-10-CM | POA: Diagnosis not present

## 2020-05-17 DIAGNOSIS — F330 Major depressive disorder, recurrent, mild: Secondary | ICD-10-CM | POA: Diagnosis not present

## 2020-05-17 DIAGNOSIS — R41841 Cognitive communication deficit: Secondary | ICD-10-CM | POA: Diagnosis not present

## 2020-05-17 DIAGNOSIS — D649 Anemia, unspecified: Secondary | ICD-10-CM | POA: Diagnosis not present

## 2020-05-17 DIAGNOSIS — M6281 Muscle weakness (generalized): Secondary | ICD-10-CM | POA: Diagnosis not present

## 2020-05-18 DIAGNOSIS — R278 Other lack of coordination: Secondary | ICD-10-CM | POA: Diagnosis not present

## 2020-05-18 DIAGNOSIS — A048 Other specified bacterial intestinal infections: Secondary | ICD-10-CM | POA: Diagnosis not present

## 2020-05-18 DIAGNOSIS — D649 Anemia, unspecified: Secondary | ICD-10-CM | POA: Diagnosis not present

## 2020-05-18 DIAGNOSIS — M6281 Muscle weakness (generalized): Secondary | ICD-10-CM | POA: Diagnosis not present

## 2020-05-18 DIAGNOSIS — R41841 Cognitive communication deficit: Secondary | ICD-10-CM | POA: Diagnosis not present

## 2020-05-18 DIAGNOSIS — D638 Anemia in other chronic diseases classified elsewhere: Secondary | ICD-10-CM | POA: Diagnosis not present

## 2020-05-20 DIAGNOSIS — M6281 Muscle weakness (generalized): Secondary | ICD-10-CM | POA: Diagnosis not present

## 2020-05-20 DIAGNOSIS — A048 Other specified bacterial intestinal infections: Secondary | ICD-10-CM | POA: Diagnosis not present

## 2020-05-20 DIAGNOSIS — D638 Anemia in other chronic diseases classified elsewhere: Secondary | ICD-10-CM | POA: Diagnosis not present

## 2020-05-20 DIAGNOSIS — R278 Other lack of coordination: Secondary | ICD-10-CM | POA: Diagnosis not present

## 2020-05-20 DIAGNOSIS — R41841 Cognitive communication deficit: Secondary | ICD-10-CM | POA: Diagnosis not present

## 2020-05-20 DIAGNOSIS — D649 Anemia, unspecified: Secondary | ICD-10-CM | POA: Diagnosis not present

## 2020-05-21 DIAGNOSIS — Z20822 Contact with and (suspected) exposure to covid-19: Secondary | ICD-10-CM | POA: Diagnosis not present

## 2020-05-24 DIAGNOSIS — A048 Other specified bacterial intestinal infections: Secondary | ICD-10-CM | POA: Diagnosis not present

## 2020-05-24 DIAGNOSIS — D638 Anemia in other chronic diseases classified elsewhere: Secondary | ICD-10-CM | POA: Diagnosis not present

## 2020-05-24 DIAGNOSIS — I10 Essential (primary) hypertension: Secondary | ICD-10-CM | POA: Diagnosis not present

## 2020-05-24 DIAGNOSIS — R2681 Unsteadiness on feet: Secondary | ICD-10-CM | POA: Diagnosis not present

## 2020-05-24 DIAGNOSIS — R296 Repeated falls: Secondary | ICD-10-CM | POA: Diagnosis not present

## 2020-05-24 DIAGNOSIS — D649 Anemia, unspecified: Secondary | ICD-10-CM | POA: Diagnosis not present

## 2020-05-24 DIAGNOSIS — M6281 Muscle weakness (generalized): Secondary | ICD-10-CM | POA: Diagnosis not present

## 2020-05-24 DIAGNOSIS — R41841 Cognitive communication deficit: Secondary | ICD-10-CM | POA: Diagnosis not present

## 2020-05-24 DIAGNOSIS — R278 Other lack of coordination: Secondary | ICD-10-CM | POA: Diagnosis not present

## 2020-05-25 DIAGNOSIS — R278 Other lack of coordination: Secondary | ICD-10-CM | POA: Diagnosis not present

## 2020-05-25 DIAGNOSIS — M6281 Muscle weakness (generalized): Secondary | ICD-10-CM | POA: Diagnosis not present

## 2020-05-25 DIAGNOSIS — A048 Other specified bacterial intestinal infections: Secondary | ICD-10-CM | POA: Diagnosis not present

## 2020-05-25 DIAGNOSIS — D649 Anemia, unspecified: Secondary | ICD-10-CM | POA: Diagnosis not present

## 2020-05-25 DIAGNOSIS — R41841 Cognitive communication deficit: Secondary | ICD-10-CM | POA: Diagnosis not present

## 2020-05-25 DIAGNOSIS — D638 Anemia in other chronic diseases classified elsewhere: Secondary | ICD-10-CM | POA: Diagnosis not present

## 2020-05-27 DIAGNOSIS — A048 Other specified bacterial intestinal infections: Secondary | ICD-10-CM | POA: Diagnosis not present

## 2020-05-27 DIAGNOSIS — R278 Other lack of coordination: Secondary | ICD-10-CM | POA: Diagnosis not present

## 2020-05-27 DIAGNOSIS — D638 Anemia in other chronic diseases classified elsewhere: Secondary | ICD-10-CM | POA: Diagnosis not present

## 2020-05-27 DIAGNOSIS — D649 Anemia, unspecified: Secondary | ICD-10-CM | POA: Diagnosis not present

## 2020-05-27 DIAGNOSIS — M6281 Muscle weakness (generalized): Secondary | ICD-10-CM | POA: Diagnosis not present

## 2020-05-27 DIAGNOSIS — R41841 Cognitive communication deficit: Secondary | ICD-10-CM | POA: Diagnosis not present

## 2020-05-31 DIAGNOSIS — R278 Other lack of coordination: Secondary | ICD-10-CM | POA: Diagnosis not present

## 2020-05-31 DIAGNOSIS — A048 Other specified bacterial intestinal infections: Secondary | ICD-10-CM | POA: Diagnosis not present

## 2020-05-31 DIAGNOSIS — M6281 Muscle weakness (generalized): Secondary | ICD-10-CM | POA: Diagnosis not present

## 2020-05-31 DIAGNOSIS — D638 Anemia in other chronic diseases classified elsewhere: Secondary | ICD-10-CM | POA: Diagnosis not present

## 2020-05-31 DIAGNOSIS — D649 Anemia, unspecified: Secondary | ICD-10-CM | POA: Diagnosis not present

## 2020-05-31 DIAGNOSIS — R41841 Cognitive communication deficit: Secondary | ICD-10-CM | POA: Diagnosis not present

## 2020-06-01 DIAGNOSIS — R41841 Cognitive communication deficit: Secondary | ICD-10-CM | POA: Diagnosis not present

## 2020-06-01 DIAGNOSIS — R278 Other lack of coordination: Secondary | ICD-10-CM | POA: Diagnosis not present

## 2020-06-01 DIAGNOSIS — A048 Other specified bacterial intestinal infections: Secondary | ICD-10-CM | POA: Diagnosis not present

## 2020-06-01 DIAGNOSIS — D649 Anemia, unspecified: Secondary | ICD-10-CM | POA: Diagnosis not present

## 2020-06-01 DIAGNOSIS — D638 Anemia in other chronic diseases classified elsewhere: Secondary | ICD-10-CM | POA: Diagnosis not present

## 2020-06-01 DIAGNOSIS — R4189 Other symptoms and signs involving cognitive functions and awareness: Secondary | ICD-10-CM | POA: Diagnosis not present

## 2020-06-01 DIAGNOSIS — E039 Hypothyroidism, unspecified: Secondary | ICD-10-CM | POA: Diagnosis not present

## 2020-06-01 DIAGNOSIS — M6281 Muscle weakness (generalized): Secondary | ICD-10-CM | POA: Diagnosis not present

## 2020-06-02 DIAGNOSIS — R278 Other lack of coordination: Secondary | ICD-10-CM | POA: Diagnosis not present

## 2020-06-02 DIAGNOSIS — M6281 Muscle weakness (generalized): Secondary | ICD-10-CM | POA: Diagnosis not present

## 2020-06-02 DIAGNOSIS — D638 Anemia in other chronic diseases classified elsewhere: Secondary | ICD-10-CM | POA: Diagnosis not present

## 2020-06-02 DIAGNOSIS — D649 Anemia, unspecified: Secondary | ICD-10-CM | POA: Diagnosis not present

## 2020-06-02 DIAGNOSIS — R41841 Cognitive communication deficit: Secondary | ICD-10-CM | POA: Diagnosis not present

## 2020-06-02 DIAGNOSIS — A048 Other specified bacterial intestinal infections: Secondary | ICD-10-CM | POA: Diagnosis not present

## 2020-06-03 DIAGNOSIS — D649 Anemia, unspecified: Secondary | ICD-10-CM | POA: Diagnosis not present

## 2020-06-03 DIAGNOSIS — A048 Other specified bacterial intestinal infections: Secondary | ICD-10-CM | POA: Diagnosis not present

## 2020-06-03 DIAGNOSIS — D638 Anemia in other chronic diseases classified elsewhere: Secondary | ICD-10-CM | POA: Diagnosis not present

## 2020-06-03 DIAGNOSIS — R41841 Cognitive communication deficit: Secondary | ICD-10-CM | POA: Diagnosis not present

## 2020-06-03 DIAGNOSIS — M6281 Muscle weakness (generalized): Secondary | ICD-10-CM | POA: Diagnosis not present

## 2020-06-03 DIAGNOSIS — R278 Other lack of coordination: Secondary | ICD-10-CM | POA: Diagnosis not present

## 2020-06-04 DIAGNOSIS — Z20822 Contact with and (suspected) exposure to covid-19: Secondary | ICD-10-CM | POA: Diagnosis not present

## 2020-06-05 DIAGNOSIS — R634 Abnormal weight loss: Secondary | ICD-10-CM | POA: Diagnosis not present

## 2020-06-05 DIAGNOSIS — E039 Hypothyroidism, unspecified: Secondary | ICD-10-CM | POA: Diagnosis not present

## 2020-06-07 DIAGNOSIS — A048 Other specified bacterial intestinal infections: Secondary | ICD-10-CM | POA: Diagnosis not present

## 2020-06-07 DIAGNOSIS — R41841 Cognitive communication deficit: Secondary | ICD-10-CM | POA: Diagnosis not present

## 2020-06-07 DIAGNOSIS — M6281 Muscle weakness (generalized): Secondary | ICD-10-CM | POA: Diagnosis not present

## 2020-06-07 DIAGNOSIS — R278 Other lack of coordination: Secondary | ICD-10-CM | POA: Diagnosis not present

## 2020-06-07 DIAGNOSIS — D649 Anemia, unspecified: Secondary | ICD-10-CM | POA: Diagnosis not present

## 2020-06-07 DIAGNOSIS — D638 Anemia in other chronic diseases classified elsewhere: Secondary | ICD-10-CM | POA: Diagnosis not present

## 2020-06-15 DIAGNOSIS — S0083XA Contusion of other part of head, initial encounter: Secondary | ICD-10-CM | POA: Diagnosis not present

## 2020-06-15 DIAGNOSIS — Z9181 History of falling: Secondary | ICD-10-CM | POA: Diagnosis not present

## 2020-06-17 DIAGNOSIS — A048 Other specified bacterial intestinal infections: Secondary | ICD-10-CM | POA: Diagnosis not present

## 2020-06-17 DIAGNOSIS — M6281 Muscle weakness (generalized): Secondary | ICD-10-CM | POA: Diagnosis not present

## 2020-06-17 DIAGNOSIS — D649 Anemia, unspecified: Secondary | ICD-10-CM | POA: Diagnosis not present

## 2020-06-17 DIAGNOSIS — R278 Other lack of coordination: Secondary | ICD-10-CM | POA: Diagnosis not present

## 2020-06-17 DIAGNOSIS — R41841 Cognitive communication deficit: Secondary | ICD-10-CM | POA: Diagnosis not present

## 2020-06-17 DIAGNOSIS — D638 Anemia in other chronic diseases classified elsewhere: Secondary | ICD-10-CM | POA: Diagnosis not present

## 2020-06-18 DIAGNOSIS — A048 Other specified bacterial intestinal infections: Secondary | ICD-10-CM | POA: Diagnosis not present

## 2020-06-18 DIAGNOSIS — D638 Anemia in other chronic diseases classified elsewhere: Secondary | ICD-10-CM | POA: Diagnosis not present

## 2020-06-18 DIAGNOSIS — R41841 Cognitive communication deficit: Secondary | ICD-10-CM | POA: Diagnosis not present

## 2020-06-18 DIAGNOSIS — D649 Anemia, unspecified: Secondary | ICD-10-CM | POA: Diagnosis not present

## 2020-06-18 DIAGNOSIS — M6281 Muscle weakness (generalized): Secondary | ICD-10-CM | POA: Diagnosis not present

## 2020-06-18 DIAGNOSIS — R278 Other lack of coordination: Secondary | ICD-10-CM | POA: Diagnosis not present

## 2020-06-19 DIAGNOSIS — A048 Other specified bacterial intestinal infections: Secondary | ICD-10-CM | POA: Diagnosis not present

## 2020-06-19 DIAGNOSIS — D638 Anemia in other chronic diseases classified elsewhere: Secondary | ICD-10-CM | POA: Diagnosis not present

## 2020-06-19 DIAGNOSIS — R278 Other lack of coordination: Secondary | ICD-10-CM | POA: Diagnosis not present

## 2020-06-19 DIAGNOSIS — R41841 Cognitive communication deficit: Secondary | ICD-10-CM | POA: Diagnosis not present

## 2020-06-19 DIAGNOSIS — D649 Anemia, unspecified: Secondary | ICD-10-CM | POA: Diagnosis not present

## 2020-06-19 DIAGNOSIS — M6281 Muscle weakness (generalized): Secondary | ICD-10-CM | POA: Diagnosis not present

## 2020-06-20 DIAGNOSIS — D638 Anemia in other chronic diseases classified elsewhere: Secondary | ICD-10-CM | POA: Diagnosis not present

## 2020-06-20 DIAGNOSIS — D649 Anemia, unspecified: Secondary | ICD-10-CM | POA: Diagnosis not present

## 2020-06-20 DIAGNOSIS — R41841 Cognitive communication deficit: Secondary | ICD-10-CM | POA: Diagnosis not present

## 2020-06-20 DIAGNOSIS — M6281 Muscle weakness (generalized): Secondary | ICD-10-CM | POA: Diagnosis not present

## 2020-06-20 DIAGNOSIS — A048 Other specified bacterial intestinal infections: Secondary | ICD-10-CM | POA: Diagnosis not present

## 2020-06-20 DIAGNOSIS — R278 Other lack of coordination: Secondary | ICD-10-CM | POA: Diagnosis not present

## 2020-06-21 DIAGNOSIS — M6281 Muscle weakness (generalized): Secondary | ICD-10-CM | POA: Diagnosis not present

## 2020-06-21 DIAGNOSIS — R41841 Cognitive communication deficit: Secondary | ICD-10-CM | POA: Diagnosis not present

## 2020-06-21 DIAGNOSIS — D649 Anemia, unspecified: Secondary | ICD-10-CM | POA: Diagnosis not present

## 2020-06-21 DIAGNOSIS — R2681 Unsteadiness on feet: Secondary | ICD-10-CM | POA: Diagnosis not present

## 2020-06-21 DIAGNOSIS — I10 Essential (primary) hypertension: Secondary | ICD-10-CM | POA: Diagnosis not present

## 2020-06-21 DIAGNOSIS — R296 Repeated falls: Secondary | ICD-10-CM | POA: Diagnosis not present

## 2020-06-21 DIAGNOSIS — A048 Other specified bacterial intestinal infections: Secondary | ICD-10-CM | POA: Diagnosis not present

## 2020-06-21 DIAGNOSIS — R278 Other lack of coordination: Secondary | ICD-10-CM | POA: Diagnosis not present

## 2020-06-21 DIAGNOSIS — D638 Anemia in other chronic diseases classified elsewhere: Secondary | ICD-10-CM | POA: Diagnosis not present

## 2020-06-22 DIAGNOSIS — M6281 Muscle weakness (generalized): Secondary | ICD-10-CM | POA: Diagnosis not present

## 2020-06-22 DIAGNOSIS — A048 Other specified bacterial intestinal infections: Secondary | ICD-10-CM | POA: Diagnosis not present

## 2020-06-22 DIAGNOSIS — D638 Anemia in other chronic diseases classified elsewhere: Secondary | ICD-10-CM | POA: Diagnosis not present

## 2020-06-22 DIAGNOSIS — R278 Other lack of coordination: Secondary | ICD-10-CM | POA: Diagnosis not present

## 2020-06-22 DIAGNOSIS — R41841 Cognitive communication deficit: Secondary | ICD-10-CM | POA: Diagnosis not present

## 2020-06-22 DIAGNOSIS — D649 Anemia, unspecified: Secondary | ICD-10-CM | POA: Diagnosis not present

## 2020-06-23 DIAGNOSIS — R41841 Cognitive communication deficit: Secondary | ICD-10-CM | POA: Diagnosis not present

## 2020-06-23 DIAGNOSIS — M6281 Muscle weakness (generalized): Secondary | ICD-10-CM | POA: Diagnosis not present

## 2020-06-23 DIAGNOSIS — A048 Other specified bacterial intestinal infections: Secondary | ICD-10-CM | POA: Diagnosis not present

## 2020-06-23 DIAGNOSIS — R278 Other lack of coordination: Secondary | ICD-10-CM | POA: Diagnosis not present

## 2020-06-23 DIAGNOSIS — D649 Anemia, unspecified: Secondary | ICD-10-CM | POA: Diagnosis not present

## 2020-06-23 DIAGNOSIS — D638 Anemia in other chronic diseases classified elsewhere: Secondary | ICD-10-CM | POA: Diagnosis not present

## 2020-06-24 DIAGNOSIS — R41841 Cognitive communication deficit: Secondary | ICD-10-CM | POA: Diagnosis not present

## 2020-06-24 DIAGNOSIS — A048 Other specified bacterial intestinal infections: Secondary | ICD-10-CM | POA: Diagnosis not present

## 2020-06-24 DIAGNOSIS — D638 Anemia in other chronic diseases classified elsewhere: Secondary | ICD-10-CM | POA: Diagnosis not present

## 2020-06-24 DIAGNOSIS — R278 Other lack of coordination: Secondary | ICD-10-CM | POA: Diagnosis not present

## 2020-06-24 DIAGNOSIS — D649 Anemia, unspecified: Secondary | ICD-10-CM | POA: Diagnosis not present

## 2020-06-24 DIAGNOSIS — M6281 Muscle weakness (generalized): Secondary | ICD-10-CM | POA: Diagnosis not present

## 2020-06-25 DIAGNOSIS — A048 Other specified bacterial intestinal infections: Secondary | ICD-10-CM | POA: Diagnosis not present

## 2020-06-25 DIAGNOSIS — D649 Anemia, unspecified: Secondary | ICD-10-CM | POA: Diagnosis not present

## 2020-06-25 DIAGNOSIS — M6281 Muscle weakness (generalized): Secondary | ICD-10-CM | POA: Diagnosis not present

## 2020-06-25 DIAGNOSIS — R278 Other lack of coordination: Secondary | ICD-10-CM | POA: Diagnosis not present

## 2020-06-25 DIAGNOSIS — Z23 Encounter for immunization: Secondary | ICD-10-CM | POA: Diagnosis not present

## 2020-06-25 DIAGNOSIS — D638 Anemia in other chronic diseases classified elsewhere: Secondary | ICD-10-CM | POA: Diagnosis not present

## 2020-06-25 DIAGNOSIS — R41841 Cognitive communication deficit: Secondary | ICD-10-CM | POA: Diagnosis not present

## 2020-06-27 DIAGNOSIS — D638 Anemia in other chronic diseases classified elsewhere: Secondary | ICD-10-CM | POA: Diagnosis not present

## 2020-06-27 DIAGNOSIS — R278 Other lack of coordination: Secondary | ICD-10-CM | POA: Diagnosis not present

## 2020-06-27 DIAGNOSIS — D649 Anemia, unspecified: Secondary | ICD-10-CM | POA: Diagnosis not present

## 2020-06-27 DIAGNOSIS — M6281 Muscle weakness (generalized): Secondary | ICD-10-CM | POA: Diagnosis not present

## 2020-06-27 DIAGNOSIS — R41841 Cognitive communication deficit: Secondary | ICD-10-CM | POA: Diagnosis not present

## 2020-06-27 DIAGNOSIS — A048 Other specified bacterial intestinal infections: Secondary | ICD-10-CM | POA: Diagnosis not present

## 2020-06-28 DIAGNOSIS — A048 Other specified bacterial intestinal infections: Secondary | ICD-10-CM | POA: Diagnosis not present

## 2020-06-28 DIAGNOSIS — R41841 Cognitive communication deficit: Secondary | ICD-10-CM | POA: Diagnosis not present

## 2020-06-28 DIAGNOSIS — M6281 Muscle weakness (generalized): Secondary | ICD-10-CM | POA: Diagnosis not present

## 2020-06-28 DIAGNOSIS — D649 Anemia, unspecified: Secondary | ICD-10-CM | POA: Diagnosis not present

## 2020-06-28 DIAGNOSIS — D638 Anemia in other chronic diseases classified elsewhere: Secondary | ICD-10-CM | POA: Diagnosis not present

## 2020-06-28 DIAGNOSIS — F39 Unspecified mood [affective] disorder: Secondary | ICD-10-CM | POA: Diagnosis not present

## 2020-06-28 DIAGNOSIS — F330 Major depressive disorder, recurrent, mild: Secondary | ICD-10-CM | POA: Diagnosis not present

## 2020-06-28 DIAGNOSIS — R278 Other lack of coordination: Secondary | ICD-10-CM | POA: Diagnosis not present

## 2020-06-29 DIAGNOSIS — A048 Other specified bacterial intestinal infections: Secondary | ICD-10-CM | POA: Diagnosis not present

## 2020-06-29 DIAGNOSIS — R41841 Cognitive communication deficit: Secondary | ICD-10-CM | POA: Diagnosis not present

## 2020-06-29 DIAGNOSIS — D638 Anemia in other chronic diseases classified elsewhere: Secondary | ICD-10-CM | POA: Diagnosis not present

## 2020-06-29 DIAGNOSIS — D649 Anemia, unspecified: Secondary | ICD-10-CM | POA: Diagnosis not present

## 2020-06-29 DIAGNOSIS — R278 Other lack of coordination: Secondary | ICD-10-CM | POA: Diagnosis not present

## 2020-06-29 DIAGNOSIS — M6281 Muscle weakness (generalized): Secondary | ICD-10-CM | POA: Diagnosis not present

## 2020-06-30 DIAGNOSIS — R41841 Cognitive communication deficit: Secondary | ICD-10-CM | POA: Diagnosis not present

## 2020-06-30 DIAGNOSIS — R278 Other lack of coordination: Secondary | ICD-10-CM | POA: Diagnosis not present

## 2020-06-30 DIAGNOSIS — D638 Anemia in other chronic diseases classified elsewhere: Secondary | ICD-10-CM | POA: Diagnosis not present

## 2020-06-30 DIAGNOSIS — A048 Other specified bacterial intestinal infections: Secondary | ICD-10-CM | POA: Diagnosis not present

## 2020-06-30 DIAGNOSIS — D649 Anemia, unspecified: Secondary | ICD-10-CM | POA: Diagnosis not present

## 2020-06-30 DIAGNOSIS — M6281 Muscle weakness (generalized): Secondary | ICD-10-CM | POA: Diagnosis not present

## 2020-07-01 DIAGNOSIS — D649 Anemia, unspecified: Secondary | ICD-10-CM | POA: Diagnosis not present

## 2020-07-01 DIAGNOSIS — M6281 Muscle weakness (generalized): Secondary | ICD-10-CM | POA: Diagnosis not present

## 2020-07-01 DIAGNOSIS — R41841 Cognitive communication deficit: Secondary | ICD-10-CM | POA: Diagnosis not present

## 2020-07-01 DIAGNOSIS — D638 Anemia in other chronic diseases classified elsewhere: Secondary | ICD-10-CM | POA: Diagnosis not present

## 2020-07-01 DIAGNOSIS — A048 Other specified bacterial intestinal infections: Secondary | ICD-10-CM | POA: Diagnosis not present

## 2020-07-01 DIAGNOSIS — R278 Other lack of coordination: Secondary | ICD-10-CM | POA: Diagnosis not present

## 2020-07-02 DIAGNOSIS — A048 Other specified bacterial intestinal infections: Secondary | ICD-10-CM | POA: Diagnosis not present

## 2020-07-02 DIAGNOSIS — D649 Anemia, unspecified: Secondary | ICD-10-CM | POA: Diagnosis not present

## 2020-07-02 DIAGNOSIS — Z20822 Contact with and (suspected) exposure to covid-19: Secondary | ICD-10-CM | POA: Diagnosis not present

## 2020-07-02 DIAGNOSIS — R41841 Cognitive communication deficit: Secondary | ICD-10-CM | POA: Diagnosis not present

## 2020-07-02 DIAGNOSIS — D638 Anemia in other chronic diseases classified elsewhere: Secondary | ICD-10-CM | POA: Diagnosis not present

## 2020-07-02 DIAGNOSIS — M6281 Muscle weakness (generalized): Secondary | ICD-10-CM | POA: Diagnosis not present

## 2020-07-02 DIAGNOSIS — R278 Other lack of coordination: Secondary | ICD-10-CM | POA: Diagnosis not present

## 2020-07-05 DIAGNOSIS — D638 Anemia in other chronic diseases classified elsewhere: Secondary | ICD-10-CM | POA: Diagnosis not present

## 2020-07-05 DIAGNOSIS — M6281 Muscle weakness (generalized): Secondary | ICD-10-CM | POA: Diagnosis not present

## 2020-07-05 DIAGNOSIS — D649 Anemia, unspecified: Secondary | ICD-10-CM | POA: Diagnosis not present

## 2020-07-05 DIAGNOSIS — A048 Other specified bacterial intestinal infections: Secondary | ICD-10-CM | POA: Diagnosis not present

## 2020-07-05 DIAGNOSIS — R41841 Cognitive communication deficit: Secondary | ICD-10-CM | POA: Diagnosis not present

## 2020-07-05 DIAGNOSIS — R278 Other lack of coordination: Secondary | ICD-10-CM | POA: Diagnosis not present

## 2020-07-06 DIAGNOSIS — A048 Other specified bacterial intestinal infections: Secondary | ICD-10-CM | POA: Diagnosis not present

## 2020-07-06 DIAGNOSIS — M6281 Muscle weakness (generalized): Secondary | ICD-10-CM | POA: Diagnosis not present

## 2020-07-06 DIAGNOSIS — D649 Anemia, unspecified: Secondary | ICD-10-CM | POA: Diagnosis not present

## 2020-07-06 DIAGNOSIS — R41841 Cognitive communication deficit: Secondary | ICD-10-CM | POA: Diagnosis not present

## 2020-07-06 DIAGNOSIS — R278 Other lack of coordination: Secondary | ICD-10-CM | POA: Diagnosis not present

## 2020-07-06 DIAGNOSIS — D638 Anemia in other chronic diseases classified elsewhere: Secondary | ICD-10-CM | POA: Diagnosis not present

## 2020-07-06 DIAGNOSIS — R2681 Unsteadiness on feet: Secondary | ICD-10-CM | POA: Diagnosis not present

## 2020-07-06 DIAGNOSIS — E039 Hypothyroidism, unspecified: Secondary | ICD-10-CM | POA: Diagnosis not present

## 2020-07-07 DIAGNOSIS — R41841 Cognitive communication deficit: Secondary | ICD-10-CM | POA: Diagnosis not present

## 2020-07-07 DIAGNOSIS — A048 Other specified bacterial intestinal infections: Secondary | ICD-10-CM | POA: Diagnosis not present

## 2020-07-07 DIAGNOSIS — R278 Other lack of coordination: Secondary | ICD-10-CM | POA: Diagnosis not present

## 2020-07-07 DIAGNOSIS — D638 Anemia in other chronic diseases classified elsewhere: Secondary | ICD-10-CM | POA: Diagnosis not present

## 2020-07-07 DIAGNOSIS — M6281 Muscle weakness (generalized): Secondary | ICD-10-CM | POA: Diagnosis not present

## 2020-07-07 DIAGNOSIS — D649 Anemia, unspecified: Secondary | ICD-10-CM | POA: Diagnosis not present

## 2020-07-08 DIAGNOSIS — D638 Anemia in other chronic diseases classified elsewhere: Secondary | ICD-10-CM | POA: Diagnosis not present

## 2020-07-08 DIAGNOSIS — A048 Other specified bacterial intestinal infections: Secondary | ICD-10-CM | POA: Diagnosis not present

## 2020-07-08 DIAGNOSIS — M6281 Muscle weakness (generalized): Secondary | ICD-10-CM | POA: Diagnosis not present

## 2020-07-08 DIAGNOSIS — Z23 Encounter for immunization: Secondary | ICD-10-CM | POA: Diagnosis not present

## 2020-07-08 DIAGNOSIS — R278 Other lack of coordination: Secondary | ICD-10-CM | POA: Diagnosis not present

## 2020-07-08 DIAGNOSIS — R41841 Cognitive communication deficit: Secondary | ICD-10-CM | POA: Diagnosis not present

## 2020-07-08 DIAGNOSIS — D649 Anemia, unspecified: Secondary | ICD-10-CM | POA: Diagnosis not present

## 2020-07-09 DIAGNOSIS — A048 Other specified bacterial intestinal infections: Secondary | ICD-10-CM | POA: Diagnosis not present

## 2020-07-09 DIAGNOSIS — M6281 Muscle weakness (generalized): Secondary | ICD-10-CM | POA: Diagnosis not present

## 2020-07-09 DIAGNOSIS — R278 Other lack of coordination: Secondary | ICD-10-CM | POA: Diagnosis not present

## 2020-07-09 DIAGNOSIS — D638 Anemia in other chronic diseases classified elsewhere: Secondary | ICD-10-CM | POA: Diagnosis not present

## 2020-07-09 DIAGNOSIS — Z20822 Contact with and (suspected) exposure to covid-19: Secondary | ICD-10-CM | POA: Diagnosis not present

## 2020-07-09 DIAGNOSIS — R41841 Cognitive communication deficit: Secondary | ICD-10-CM | POA: Diagnosis not present

## 2020-07-09 DIAGNOSIS — D649 Anemia, unspecified: Secondary | ICD-10-CM | POA: Diagnosis not present

## 2020-07-11 DIAGNOSIS — D649 Anemia, unspecified: Secondary | ICD-10-CM | POA: Diagnosis not present

## 2020-07-11 DIAGNOSIS — A048 Other specified bacterial intestinal infections: Secondary | ICD-10-CM | POA: Diagnosis not present

## 2020-07-11 DIAGNOSIS — R278 Other lack of coordination: Secondary | ICD-10-CM | POA: Diagnosis not present

## 2020-07-11 DIAGNOSIS — M6281 Muscle weakness (generalized): Secondary | ICD-10-CM | POA: Diagnosis not present

## 2020-07-11 DIAGNOSIS — D638 Anemia in other chronic diseases classified elsewhere: Secondary | ICD-10-CM | POA: Diagnosis not present

## 2020-07-11 DIAGNOSIS — R41841 Cognitive communication deficit: Secondary | ICD-10-CM | POA: Diagnosis not present

## 2020-07-12 DIAGNOSIS — D649 Anemia, unspecified: Secondary | ICD-10-CM | POA: Diagnosis not present

## 2020-07-12 DIAGNOSIS — D638 Anemia in other chronic diseases classified elsewhere: Secondary | ICD-10-CM | POA: Diagnosis not present

## 2020-07-12 DIAGNOSIS — R278 Other lack of coordination: Secondary | ICD-10-CM | POA: Diagnosis not present

## 2020-07-12 DIAGNOSIS — A048 Other specified bacterial intestinal infections: Secondary | ICD-10-CM | POA: Diagnosis not present

## 2020-07-12 DIAGNOSIS — R41841 Cognitive communication deficit: Secondary | ICD-10-CM | POA: Diagnosis not present

## 2020-07-12 DIAGNOSIS — M6281 Muscle weakness (generalized): Secondary | ICD-10-CM | POA: Diagnosis not present

## 2020-07-13 DIAGNOSIS — R278 Other lack of coordination: Secondary | ICD-10-CM | POA: Diagnosis not present

## 2020-07-13 DIAGNOSIS — A048 Other specified bacterial intestinal infections: Secondary | ICD-10-CM | POA: Diagnosis not present

## 2020-07-13 DIAGNOSIS — D638 Anemia in other chronic diseases classified elsewhere: Secondary | ICD-10-CM | POA: Diagnosis not present

## 2020-07-13 DIAGNOSIS — M6281 Muscle weakness (generalized): Secondary | ICD-10-CM | POA: Diagnosis not present

## 2020-07-13 DIAGNOSIS — R41841 Cognitive communication deficit: Secondary | ICD-10-CM | POA: Diagnosis not present

## 2020-07-13 DIAGNOSIS — D649 Anemia, unspecified: Secondary | ICD-10-CM | POA: Diagnosis not present

## 2020-07-14 DIAGNOSIS — R278 Other lack of coordination: Secondary | ICD-10-CM | POA: Diagnosis not present

## 2020-07-14 DIAGNOSIS — A048 Other specified bacterial intestinal infections: Secondary | ICD-10-CM | POA: Diagnosis not present

## 2020-07-14 DIAGNOSIS — D638 Anemia in other chronic diseases classified elsewhere: Secondary | ICD-10-CM | POA: Diagnosis not present

## 2020-07-14 DIAGNOSIS — M6281 Muscle weakness (generalized): Secondary | ICD-10-CM | POA: Diagnosis not present

## 2020-07-14 DIAGNOSIS — D649 Anemia, unspecified: Secondary | ICD-10-CM | POA: Diagnosis not present

## 2020-07-14 DIAGNOSIS — R41841 Cognitive communication deficit: Secondary | ICD-10-CM | POA: Diagnosis not present

## 2020-07-16 DIAGNOSIS — M6281 Muscle weakness (generalized): Secondary | ICD-10-CM | POA: Diagnosis not present

## 2020-07-16 DIAGNOSIS — D649 Anemia, unspecified: Secondary | ICD-10-CM | POA: Diagnosis not present

## 2020-07-16 DIAGNOSIS — R278 Other lack of coordination: Secondary | ICD-10-CM | POA: Diagnosis not present

## 2020-07-16 DIAGNOSIS — D638 Anemia in other chronic diseases classified elsewhere: Secondary | ICD-10-CM | POA: Diagnosis not present

## 2020-07-16 DIAGNOSIS — R41841 Cognitive communication deficit: Secondary | ICD-10-CM | POA: Diagnosis not present

## 2020-07-16 DIAGNOSIS — A048 Other specified bacterial intestinal infections: Secondary | ICD-10-CM | POA: Diagnosis not present

## 2020-07-17 DIAGNOSIS — D638 Anemia in other chronic diseases classified elsewhere: Secondary | ICD-10-CM | POA: Diagnosis not present

## 2020-07-17 DIAGNOSIS — R41841 Cognitive communication deficit: Secondary | ICD-10-CM | POA: Diagnosis not present

## 2020-07-17 DIAGNOSIS — D649 Anemia, unspecified: Secondary | ICD-10-CM | POA: Diagnosis not present

## 2020-07-17 DIAGNOSIS — A048 Other specified bacterial intestinal infections: Secondary | ICD-10-CM | POA: Diagnosis not present

## 2020-07-17 DIAGNOSIS — R278 Other lack of coordination: Secondary | ICD-10-CM | POA: Diagnosis not present

## 2020-07-17 DIAGNOSIS — M6281 Muscle weakness (generalized): Secondary | ICD-10-CM | POA: Diagnosis not present

## 2020-07-18 DIAGNOSIS — A048 Other specified bacterial intestinal infections: Secondary | ICD-10-CM | POA: Diagnosis not present

## 2020-07-18 DIAGNOSIS — R278 Other lack of coordination: Secondary | ICD-10-CM | POA: Diagnosis not present

## 2020-07-18 DIAGNOSIS — D638 Anemia in other chronic diseases classified elsewhere: Secondary | ICD-10-CM | POA: Diagnosis not present

## 2020-07-18 DIAGNOSIS — D649 Anemia, unspecified: Secondary | ICD-10-CM | POA: Diagnosis not present

## 2020-07-18 DIAGNOSIS — M6281 Muscle weakness (generalized): Secondary | ICD-10-CM | POA: Diagnosis not present

## 2020-07-18 DIAGNOSIS — R41841 Cognitive communication deficit: Secondary | ICD-10-CM | POA: Diagnosis not present

## 2020-07-19 DIAGNOSIS — D638 Anemia in other chronic diseases classified elsewhere: Secondary | ICD-10-CM | POA: Diagnosis not present

## 2020-07-19 DIAGNOSIS — R41841 Cognitive communication deficit: Secondary | ICD-10-CM | POA: Diagnosis not present

## 2020-07-19 DIAGNOSIS — A048 Other specified bacterial intestinal infections: Secondary | ICD-10-CM | POA: Diagnosis not present

## 2020-07-19 DIAGNOSIS — D649 Anemia, unspecified: Secondary | ICD-10-CM | POA: Diagnosis not present

## 2020-07-19 DIAGNOSIS — M6281 Muscle weakness (generalized): Secondary | ICD-10-CM | POA: Diagnosis not present

## 2020-07-19 DIAGNOSIS — R278 Other lack of coordination: Secondary | ICD-10-CM | POA: Diagnosis not present

## 2020-07-20 DIAGNOSIS — R278 Other lack of coordination: Secondary | ICD-10-CM | POA: Diagnosis not present

## 2020-07-20 DIAGNOSIS — M6281 Muscle weakness (generalized): Secondary | ICD-10-CM | POA: Diagnosis not present

## 2020-07-20 DIAGNOSIS — A048 Other specified bacterial intestinal infections: Secondary | ICD-10-CM | POA: Diagnosis not present

## 2020-07-20 DIAGNOSIS — R41841 Cognitive communication deficit: Secondary | ICD-10-CM | POA: Diagnosis not present

## 2020-07-20 DIAGNOSIS — D638 Anemia in other chronic diseases classified elsewhere: Secondary | ICD-10-CM | POA: Diagnosis not present

## 2020-07-20 DIAGNOSIS — D649 Anemia, unspecified: Secondary | ICD-10-CM | POA: Diagnosis not present

## 2020-07-21 DIAGNOSIS — A048 Other specified bacterial intestinal infections: Secondary | ICD-10-CM | POA: Diagnosis not present

## 2020-07-21 DIAGNOSIS — R296 Repeated falls: Secondary | ICD-10-CM | POA: Diagnosis not present

## 2020-07-21 DIAGNOSIS — R278 Other lack of coordination: Secondary | ICD-10-CM | POA: Diagnosis not present

## 2020-07-21 DIAGNOSIS — I10 Essential (primary) hypertension: Secondary | ICD-10-CM | POA: Diagnosis not present

## 2020-07-21 DIAGNOSIS — R41841 Cognitive communication deficit: Secondary | ICD-10-CM | POA: Diagnosis not present

## 2020-07-21 DIAGNOSIS — D638 Anemia in other chronic diseases classified elsewhere: Secondary | ICD-10-CM | POA: Diagnosis not present

## 2020-07-21 DIAGNOSIS — D649 Anemia, unspecified: Secondary | ICD-10-CM | POA: Diagnosis not present

## 2020-07-21 DIAGNOSIS — R2681 Unsteadiness on feet: Secondary | ICD-10-CM | POA: Diagnosis not present

## 2020-07-21 DIAGNOSIS — M6281 Muscle weakness (generalized): Secondary | ICD-10-CM | POA: Diagnosis not present

## 2020-07-22 DIAGNOSIS — D638 Anemia in other chronic diseases classified elsewhere: Secondary | ICD-10-CM | POA: Diagnosis not present

## 2020-07-22 DIAGNOSIS — I10 Essential (primary) hypertension: Secondary | ICD-10-CM | POA: Diagnosis not present

## 2020-07-22 DIAGNOSIS — A048 Other specified bacterial intestinal infections: Secondary | ICD-10-CM | POA: Diagnosis not present

## 2020-07-22 DIAGNOSIS — R2681 Unsteadiness on feet: Secondary | ICD-10-CM | POA: Diagnosis not present

## 2020-07-22 DIAGNOSIS — M6281 Muscle weakness (generalized): Secondary | ICD-10-CM | POA: Diagnosis not present

## 2020-07-22 DIAGNOSIS — R296 Repeated falls: Secondary | ICD-10-CM | POA: Diagnosis not present

## 2020-07-23 DIAGNOSIS — R2681 Unsteadiness on feet: Secondary | ICD-10-CM | POA: Diagnosis not present

## 2020-07-23 DIAGNOSIS — R296 Repeated falls: Secondary | ICD-10-CM | POA: Diagnosis not present

## 2020-07-23 DIAGNOSIS — A048 Other specified bacterial intestinal infections: Secondary | ICD-10-CM | POA: Diagnosis not present

## 2020-07-23 DIAGNOSIS — D638 Anemia in other chronic diseases classified elsewhere: Secondary | ICD-10-CM | POA: Diagnosis not present

## 2020-07-23 DIAGNOSIS — I10 Essential (primary) hypertension: Secondary | ICD-10-CM | POA: Diagnosis not present

## 2020-07-23 DIAGNOSIS — M6281 Muscle weakness (generalized): Secondary | ICD-10-CM | POA: Diagnosis not present

## 2020-07-24 DIAGNOSIS — M6281 Muscle weakness (generalized): Secondary | ICD-10-CM | POA: Diagnosis not present

## 2020-07-24 DIAGNOSIS — D638 Anemia in other chronic diseases classified elsewhere: Secondary | ICD-10-CM | POA: Diagnosis not present

## 2020-07-24 DIAGNOSIS — R296 Repeated falls: Secondary | ICD-10-CM | POA: Diagnosis not present

## 2020-07-24 DIAGNOSIS — A048 Other specified bacterial intestinal infections: Secondary | ICD-10-CM | POA: Diagnosis not present

## 2020-07-24 DIAGNOSIS — R2681 Unsteadiness on feet: Secondary | ICD-10-CM | POA: Diagnosis not present

## 2020-07-24 DIAGNOSIS — I10 Essential (primary) hypertension: Secondary | ICD-10-CM | POA: Diagnosis not present

## 2020-07-25 DIAGNOSIS — D638 Anemia in other chronic diseases classified elsewhere: Secondary | ICD-10-CM | POA: Diagnosis not present

## 2020-07-25 DIAGNOSIS — A048 Other specified bacterial intestinal infections: Secondary | ICD-10-CM | POA: Diagnosis not present

## 2020-07-25 DIAGNOSIS — I10 Essential (primary) hypertension: Secondary | ICD-10-CM | POA: Diagnosis not present

## 2020-07-25 DIAGNOSIS — R296 Repeated falls: Secondary | ICD-10-CM | POA: Diagnosis not present

## 2020-07-25 DIAGNOSIS — M6281 Muscle weakness (generalized): Secondary | ICD-10-CM | POA: Diagnosis not present

## 2020-07-25 DIAGNOSIS — R2681 Unsteadiness on feet: Secondary | ICD-10-CM | POA: Diagnosis not present

## 2020-07-26 DIAGNOSIS — R2681 Unsteadiness on feet: Secondary | ICD-10-CM | POA: Diagnosis not present

## 2020-07-26 DIAGNOSIS — R296 Repeated falls: Secondary | ICD-10-CM | POA: Diagnosis not present

## 2020-07-26 DIAGNOSIS — A048 Other specified bacterial intestinal infections: Secondary | ICD-10-CM | POA: Diagnosis not present

## 2020-07-26 DIAGNOSIS — I10 Essential (primary) hypertension: Secondary | ICD-10-CM | POA: Diagnosis not present

## 2020-07-26 DIAGNOSIS — M6281 Muscle weakness (generalized): Secondary | ICD-10-CM | POA: Diagnosis not present

## 2020-07-26 DIAGNOSIS — D638 Anemia in other chronic diseases classified elsewhere: Secondary | ICD-10-CM | POA: Diagnosis not present

## 2020-07-27 DIAGNOSIS — R296 Repeated falls: Secondary | ICD-10-CM | POA: Diagnosis not present

## 2020-07-27 DIAGNOSIS — I10 Essential (primary) hypertension: Secondary | ICD-10-CM | POA: Diagnosis not present

## 2020-07-27 DIAGNOSIS — M6281 Muscle weakness (generalized): Secondary | ICD-10-CM | POA: Diagnosis not present

## 2020-07-27 DIAGNOSIS — D638 Anemia in other chronic diseases classified elsewhere: Secondary | ICD-10-CM | POA: Diagnosis not present

## 2020-07-27 DIAGNOSIS — R2681 Unsteadiness on feet: Secondary | ICD-10-CM | POA: Diagnosis not present

## 2020-07-27 DIAGNOSIS — A048 Other specified bacterial intestinal infections: Secondary | ICD-10-CM | POA: Diagnosis not present

## 2020-07-28 DIAGNOSIS — I10 Essential (primary) hypertension: Secondary | ICD-10-CM | POA: Diagnosis not present

## 2020-07-28 DIAGNOSIS — R296 Repeated falls: Secondary | ICD-10-CM | POA: Diagnosis not present

## 2020-07-28 DIAGNOSIS — M6281 Muscle weakness (generalized): Secondary | ICD-10-CM | POA: Diagnosis not present

## 2020-07-28 DIAGNOSIS — A048 Other specified bacterial intestinal infections: Secondary | ICD-10-CM | POA: Diagnosis not present

## 2020-07-28 DIAGNOSIS — R2681 Unsteadiness on feet: Secondary | ICD-10-CM | POA: Diagnosis not present

## 2020-07-28 DIAGNOSIS — D638 Anemia in other chronic diseases classified elsewhere: Secondary | ICD-10-CM | POA: Diagnosis not present

## 2020-07-29 DIAGNOSIS — A048 Other specified bacterial intestinal infections: Secondary | ICD-10-CM | POA: Diagnosis not present

## 2020-07-29 DIAGNOSIS — M6281 Muscle weakness (generalized): Secondary | ICD-10-CM | POA: Diagnosis not present

## 2020-07-29 DIAGNOSIS — R296 Repeated falls: Secondary | ICD-10-CM | POA: Diagnosis not present

## 2020-07-29 DIAGNOSIS — I10 Essential (primary) hypertension: Secondary | ICD-10-CM | POA: Diagnosis not present

## 2020-07-29 DIAGNOSIS — D638 Anemia in other chronic diseases classified elsewhere: Secondary | ICD-10-CM | POA: Diagnosis not present

## 2020-07-29 DIAGNOSIS — R2681 Unsteadiness on feet: Secondary | ICD-10-CM | POA: Diagnosis not present

## 2020-07-30 ENCOUNTER — Other Ambulatory Visit: Payer: Self-pay

## 2020-07-30 ENCOUNTER — Non-Acute Institutional Stay: Payer: Medicare Other | Admitting: Adult Health Nurse Practitioner

## 2020-07-30 DIAGNOSIS — F05 Delirium due to known physiological condition: Secondary | ICD-10-CM | POA: Diagnosis not present

## 2020-07-30 DIAGNOSIS — R2681 Unsteadiness on feet: Secondary | ICD-10-CM | POA: Diagnosis not present

## 2020-07-30 DIAGNOSIS — Z515 Encounter for palliative care: Secondary | ICD-10-CM | POA: Diagnosis not present

## 2020-07-30 DIAGNOSIS — F0391 Unspecified dementia with behavioral disturbance: Secondary | ICD-10-CM

## 2020-07-30 DIAGNOSIS — D638 Anemia in other chronic diseases classified elsewhere: Secondary | ICD-10-CM | POA: Diagnosis not present

## 2020-07-30 DIAGNOSIS — R296 Repeated falls: Secondary | ICD-10-CM | POA: Diagnosis not present

## 2020-07-30 DIAGNOSIS — A048 Other specified bacterial intestinal infections: Secondary | ICD-10-CM | POA: Diagnosis not present

## 2020-07-30 DIAGNOSIS — I10 Essential (primary) hypertension: Secondary | ICD-10-CM | POA: Diagnosis not present

## 2020-07-30 DIAGNOSIS — M6281 Muscle weakness (generalized): Secondary | ICD-10-CM | POA: Diagnosis not present

## 2020-07-30 NOTE — Progress Notes (Signed)
[facility] Consult Note Telephone: [phone]  Fax: [phone]  PATIENT NAME: [name] DOB: 12-Aug-1921 MRN: [id number]  PRIMARY CARE PROVIDER:   Dr. Yves Dill PROVIDER:  Dr. Nyra Capes  RESPONSIBLE PARTY:   Self [phone] Emergency contact - Daughter- Carroll Sage [phone]    Chief complaint:  Follow up palliative visit/ dementia    RECOMMENDATIONS and PLAN:  1.  Advanced care planning.  Patient is DNR.  Spoke with daughter via phone and updated on today's visit.  She did have concerns about increased sundowning. Discussed disease progression.  2.  Dementia/sundowning.  Patient does have some agitation related to sundowning.  This does not happen every day.  Continue to monitor for need to add medication such as Seroquel or Zyprexa for agitation and anxiety related to sundowning.  Continue to use redirection.  Continue supportive care at facility.  3.  Functional status.  Patient propels herself around in wheelchair.  She is wheelchair-bound but is able to transfer herself.  Does require some assistance with ADLs such as bathing and dressing.  Functional status is unchanged.  Continue supportive care at facility.  Palliative will continue to follow for symptom management/decline and make recommendations as needed.  Follow-up in 8 to 10 weeks.  Daughter encouraged to call with any concerns or questions.   I spent 30 minutes providing this consultation. More than 50% of the time in this consultation was spent coordinating communication.   HISTORY OF PRESENT ILLNESS:  [name] is a 86 y.o. year old female with multiple medical problems including GERD chronic low back pain, HTN Hypothyroidism, dementia, sundowning. Palliative Care was asked to help address goals of care.  Today patient is in bed and sleepy.  Staff does report that she often stays up late wandering the halls in her wheelchair  and that she does not get started until later in the mornings.  She is alert enough to take her medications and answer questions.  Patient does state that she does get dizzy once in a while with position changes.  Appetite is good and present weight at 100.8 pounds.  Staff has no new concerns.  Daughter does state that staff has told her that they have noticed sundowning that occurs around 3:00 in the afternoons.  Daughter states that she can become agitated when she becomes fixated on certain topic.  She does try to use redirection which sometimes helps and sometimes does not.  Does not have wounds.  No reported falls, infections, hospital visits since last visit.  Rest of 10 point ROS asked and negative.   CODE STATUS: DNR  PPS: 40% HOSPICE ELIGIBILITY/DIAGNOSIS: TBD  PHYSICAL EXAM:  BP 148/62  HR 64 O2 95% on RA General: NAD, frail appearing, thin Cardiovascular:  Irregularly regular rate and rhythm Pulmonary: lung sounds clear; normal respiratory effort Abdomen: soft, nontender, + bowel sounds Extremities: no edema, no joint deformities Skin: no rashes on exposed skin Neurological: Weakness; patient having increased sundowning  PAST MEDICAL HISTORY:  Past Medical History:  Diagnosis Date  . Anemia   . Anginal pain ([place])   . Arthritis    "all over; doesn't seem to bother me much" (08/22/2013)  . Chronic lower back pain   . Dysrhythmia    "irregular heart beat"   . Exertional shortness of breath   . GERD (gastroesophageal reflux disease)   . Hypertension   . Hypothyroidism   . Migraines    "  used to"  . Mixed hyperlipidemia   . Osteoporosis   . Paroxysmal supraventricular tachycardia ([place])   . Skin cancer    "couple taken off right leg" (08/22/2013)    SOCIAL HX:  Social History   Tobacco Use  . Smoking status: Never Smoker  . Smokeless tobacco: Never Used  Substance Use Topics  . Alcohol use: No    ALLERGIES:  Allergies  Allergen Reactions  . Codeine Other (See  Comments)    "I feel like I'm floating"   . Gabapentin Other (See Comments)    Cannot be on for a good length of time- causes a lot of lethargy  . Tramadol Other (See Comments)    On MAR  . Other Other (See Comments)    All pain meds cause lethargy and disorientation     PERTINENT MEDICATIONS:  Outpatient Encounter Medications as of 07/30/2020  Medication Sig  . aspirin EC 81 MG tablet Take 1 tablet (81 mg total) by mouth daily. (Patient not taking: Reported on 01/27/2020)  . benzonatate (TESSALON) 100 MG capsule Take 1 capsule (100 mg total) by mouth 3 (three) times daily as needed for cough. (Patient not taking: Reported on 01/18/2020)  . bimatoprost (LUMIGAN) 0.01 % SOLN Place 1 drop into both eyes at bedtime.  . brimonidine (ALPHAGAN) 0.15 % ophthalmic solution Place 1 drop into both eyes 3 (three) times daily.   . Calcium 600-200 MG-UNIT tablet Take 2 tablets by mouth daily.   . cloNIDine (CATAPRES) 0.1 MG tablet Take 0.1 mg by mouth daily as needed (for SBP>180).  . Colesevelam HCl (WELCHOL) 3.75 g PACK Take 3.75 g by mouth daily.  Marland Kitchen diltiazem (CARDIZEM CD) 120 MG 24 hr capsule Take 120 mg by mouth daily.   . dorzolamide-timolol (COSOPT) 22.3-6.8 MG/ML ophthalmic solution Place 1 drop into the left eye 2 (two) times daily.  . feeding supplement, ENSURE ENLIVE, (ENSURE ENLIVE) LIQD Take 237 mLs by mouth 2 (two) times daily between meals. (Patient not taking: Reported on 01/18/2020)  . ferrous sulfate 325 (65 FE) MG EC tablet Take 325 mg by mouth daily with breakfast.   . fexofenadine (ALLEGRA) 60 MG tablet Take 60 mg by mouth 2 (two) times daily.  Marland Kitchen lactose free nutrition (BOOST) LIQD Take 237 mLs by mouth daily.  Marland Kitchen levothyroxine (SYNTHROID) 25 MCG tablet Take 25 mcg by mouth every other day. Alternating with 71mcg  . levothyroxine (SYNTHROID, LEVOTHROID) 50 MCG tablet Take 50 mcg by mouth every other day.   . Lidocaine HCl (ASPERCREME LIDOCAINE) 4 % CREA Apply topically in the morning  and at bedtime. To affected area of mid-back  . lisinopril (ZESTRIL) 20 MG tablet Take 20 mg by mouth daily.  Marland Kitchen loperamide (IMODIUM A-D) 2 MG tablet Take 2 mg by mouth daily as needed for diarrhea or loose stools.  . meclizine (ANTIVERT) 25 MG tablet Take 25 mg by mouth 3 (three) times daily as needed for dizziness.  . metoprolol (LOPRESSOR) 50 MG tablet Take 50 mg by mouth 2 (two) times daily.  . mirtazapine (REMERON) 15 MG tablet Take 15 mg by mouth at bedtime.  . nitroGLYCERIN (NITROSTAT) 0.4 MG SL tablet Place 1 tablet (0.4 mg total) under the tongue every 5 (five) minutes as needed for chest pain.  . Omega-3 Fatty Acids (FISH OIL) 1000 MG CAPS Take 2,000 mg by mouth daily.   Marland Kitchen omeprazole (PRILOSEC) 20 MG capsule Take 20 mg by mouth daily.  . pantoprazole (PROTONIX) 40 MG tablet Take  1 tablet (40 mg total) by mouth 2 (two) times daily before a meal. Take 2 times daily for 2 weeks, then 1 tablet daily/ (Patient taking differently: Take 40 mg by mouth daily. then 1 tablet daily)  . Probiotic Product (PROBIOTIC-10 PO) Take 1 capsule by mouth in the morning and at bedtime.  . sertraline (ZOLOFT) 100 MG tablet Take 100 mg by mouth daily.    No facility-administered encounter medications on file as of 07/30/2020.     Nely Dedmon Jenetta Downer, NP

## 2020-08-01 DIAGNOSIS — M6281 Muscle weakness (generalized): Secondary | ICD-10-CM | POA: Diagnosis not present

## 2020-08-01 DIAGNOSIS — D638 Anemia in other chronic diseases classified elsewhere: Secondary | ICD-10-CM | POA: Diagnosis not present

## 2020-08-01 DIAGNOSIS — I10 Essential (primary) hypertension: Secondary | ICD-10-CM | POA: Diagnosis not present

## 2020-08-01 DIAGNOSIS — A048 Other specified bacterial intestinal infections: Secondary | ICD-10-CM | POA: Diagnosis not present

## 2020-08-01 DIAGNOSIS — R296 Repeated falls: Secondary | ICD-10-CM | POA: Diagnosis not present

## 2020-08-01 DIAGNOSIS — R2681 Unsteadiness on feet: Secondary | ICD-10-CM | POA: Diagnosis not present

## 2020-08-02 DIAGNOSIS — R296 Repeated falls: Secondary | ICD-10-CM | POA: Diagnosis not present

## 2020-08-02 DIAGNOSIS — M6281 Muscle weakness (generalized): Secondary | ICD-10-CM | POA: Diagnosis not present

## 2020-08-02 DIAGNOSIS — A048 Other specified bacterial intestinal infections: Secondary | ICD-10-CM | POA: Diagnosis not present

## 2020-08-02 DIAGNOSIS — D638 Anemia in other chronic diseases classified elsewhere: Secondary | ICD-10-CM | POA: Diagnosis not present

## 2020-08-02 DIAGNOSIS — I10 Essential (primary) hypertension: Secondary | ICD-10-CM | POA: Diagnosis not present

## 2020-08-02 DIAGNOSIS — R2681 Unsteadiness on feet: Secondary | ICD-10-CM | POA: Diagnosis not present

## 2020-08-03 DIAGNOSIS — I10 Essential (primary) hypertension: Secondary | ICD-10-CM | POA: Diagnosis not present

## 2020-08-03 DIAGNOSIS — M6281 Muscle weakness (generalized): Secondary | ICD-10-CM | POA: Diagnosis not present

## 2020-08-03 DIAGNOSIS — R296 Repeated falls: Secondary | ICD-10-CM | POA: Diagnosis not present

## 2020-08-03 DIAGNOSIS — D638 Anemia in other chronic diseases classified elsewhere: Secondary | ICD-10-CM | POA: Diagnosis not present

## 2020-08-03 DIAGNOSIS — R2681 Unsteadiness on feet: Secondary | ICD-10-CM | POA: Diagnosis not present

## 2020-08-03 DIAGNOSIS — A048 Other specified bacterial intestinal infections: Secondary | ICD-10-CM | POA: Diagnosis not present

## 2020-08-04 DIAGNOSIS — R2681 Unsteadiness on feet: Secondary | ICD-10-CM | POA: Diagnosis not present

## 2020-08-04 DIAGNOSIS — A048 Other specified bacterial intestinal infections: Secondary | ICD-10-CM | POA: Diagnosis not present

## 2020-08-04 DIAGNOSIS — D638 Anemia in other chronic diseases classified elsewhere: Secondary | ICD-10-CM | POA: Diagnosis not present

## 2020-08-04 DIAGNOSIS — R296 Repeated falls: Secondary | ICD-10-CM | POA: Diagnosis not present

## 2020-08-04 DIAGNOSIS — I10 Essential (primary) hypertension: Secondary | ICD-10-CM | POA: Diagnosis not present

## 2020-08-04 DIAGNOSIS — M6281 Muscle weakness (generalized): Secondary | ICD-10-CM | POA: Diagnosis not present

## 2020-08-05 DIAGNOSIS — R2681 Unsteadiness on feet: Secondary | ICD-10-CM | POA: Diagnosis not present

## 2020-08-05 DIAGNOSIS — F339 Major depressive disorder, recurrent, unspecified: Secondary | ICD-10-CM | POA: Diagnosis not present

## 2020-08-05 DIAGNOSIS — A048 Other specified bacterial intestinal infections: Secondary | ICD-10-CM | POA: Diagnosis not present

## 2020-08-05 DIAGNOSIS — R41841 Cognitive communication deficit: Secondary | ICD-10-CM | POA: Diagnosis not present

## 2020-08-05 DIAGNOSIS — D638 Anemia in other chronic diseases classified elsewhere: Secondary | ICD-10-CM | POA: Diagnosis not present

## 2020-08-05 DIAGNOSIS — R296 Repeated falls: Secondary | ICD-10-CM | POA: Diagnosis not present

## 2020-08-05 DIAGNOSIS — I10 Essential (primary) hypertension: Secondary | ICD-10-CM | POA: Diagnosis not present

## 2020-08-05 DIAGNOSIS — M6281 Muscle weakness (generalized): Secondary | ICD-10-CM | POA: Diagnosis not present

## 2020-08-06 DIAGNOSIS — R2681 Unsteadiness on feet: Secondary | ICD-10-CM | POA: Diagnosis not present

## 2020-08-06 DIAGNOSIS — R296 Repeated falls: Secondary | ICD-10-CM | POA: Diagnosis not present

## 2020-08-06 DIAGNOSIS — D638 Anemia in other chronic diseases classified elsewhere: Secondary | ICD-10-CM | POA: Diagnosis not present

## 2020-08-06 DIAGNOSIS — A048 Other specified bacterial intestinal infections: Secondary | ICD-10-CM | POA: Diagnosis not present

## 2020-08-06 DIAGNOSIS — M6281 Muscle weakness (generalized): Secondary | ICD-10-CM | POA: Diagnosis not present

## 2020-08-06 DIAGNOSIS — I10 Essential (primary) hypertension: Secondary | ICD-10-CM | POA: Diagnosis not present

## 2020-08-08 DIAGNOSIS — M6281 Muscle weakness (generalized): Secondary | ICD-10-CM | POA: Diagnosis not present

## 2020-08-08 DIAGNOSIS — D638 Anemia in other chronic diseases classified elsewhere: Secondary | ICD-10-CM | POA: Diagnosis not present

## 2020-08-08 DIAGNOSIS — I10 Essential (primary) hypertension: Secondary | ICD-10-CM | POA: Diagnosis not present

## 2020-08-08 DIAGNOSIS — A048 Other specified bacterial intestinal infections: Secondary | ICD-10-CM | POA: Diagnosis not present

## 2020-08-08 DIAGNOSIS — R2681 Unsteadiness on feet: Secondary | ICD-10-CM | POA: Diagnosis not present

## 2020-08-08 DIAGNOSIS — R296 Repeated falls: Secondary | ICD-10-CM | POA: Diagnosis not present

## 2020-08-09 DIAGNOSIS — M6281 Muscle weakness (generalized): Secondary | ICD-10-CM | POA: Diagnosis not present

## 2020-08-09 DIAGNOSIS — I10 Essential (primary) hypertension: Secondary | ICD-10-CM | POA: Diagnosis not present

## 2020-08-09 DIAGNOSIS — A048 Other specified bacterial intestinal infections: Secondary | ICD-10-CM | POA: Diagnosis not present

## 2020-08-09 DIAGNOSIS — F39 Unspecified mood [affective] disorder: Secondary | ICD-10-CM | POA: Diagnosis not present

## 2020-08-09 DIAGNOSIS — R296 Repeated falls: Secondary | ICD-10-CM | POA: Diagnosis not present

## 2020-08-09 DIAGNOSIS — F330 Major depressive disorder, recurrent, mild: Secondary | ICD-10-CM | POA: Diagnosis not present

## 2020-08-09 DIAGNOSIS — D638 Anemia in other chronic diseases classified elsewhere: Secondary | ICD-10-CM | POA: Diagnosis not present

## 2020-08-09 DIAGNOSIS — R2681 Unsteadiness on feet: Secondary | ICD-10-CM | POA: Diagnosis not present

## 2020-08-10 DIAGNOSIS — I10 Essential (primary) hypertension: Secondary | ICD-10-CM | POA: Diagnosis not present

## 2020-08-10 DIAGNOSIS — M6281 Muscle weakness (generalized): Secondary | ICD-10-CM | POA: Diagnosis not present

## 2020-08-10 DIAGNOSIS — D638 Anemia in other chronic diseases classified elsewhere: Secondary | ICD-10-CM | POA: Diagnosis not present

## 2020-08-10 DIAGNOSIS — A048 Other specified bacterial intestinal infections: Secondary | ICD-10-CM | POA: Diagnosis not present

## 2020-08-10 DIAGNOSIS — R296 Repeated falls: Secondary | ICD-10-CM | POA: Diagnosis not present

## 2020-08-10 DIAGNOSIS — R2681 Unsteadiness on feet: Secondary | ICD-10-CM | POA: Diagnosis not present

## 2020-08-11 DIAGNOSIS — R296 Repeated falls: Secondary | ICD-10-CM | POA: Diagnosis not present

## 2020-08-11 DIAGNOSIS — A048 Other specified bacterial intestinal infections: Secondary | ICD-10-CM | POA: Diagnosis not present

## 2020-08-11 DIAGNOSIS — R2681 Unsteadiness on feet: Secondary | ICD-10-CM | POA: Diagnosis not present

## 2020-08-11 DIAGNOSIS — I10 Essential (primary) hypertension: Secondary | ICD-10-CM | POA: Diagnosis not present

## 2020-08-11 DIAGNOSIS — M6281 Muscle weakness (generalized): Secondary | ICD-10-CM | POA: Diagnosis not present

## 2020-08-11 DIAGNOSIS — D638 Anemia in other chronic diseases classified elsewhere: Secondary | ICD-10-CM | POA: Diagnosis not present

## 2020-08-12 DIAGNOSIS — D638 Anemia in other chronic diseases classified elsewhere: Secondary | ICD-10-CM | POA: Diagnosis not present

## 2020-08-12 DIAGNOSIS — A048 Other specified bacterial intestinal infections: Secondary | ICD-10-CM | POA: Diagnosis not present

## 2020-08-12 DIAGNOSIS — R2681 Unsteadiness on feet: Secondary | ICD-10-CM | POA: Diagnosis not present

## 2020-08-12 DIAGNOSIS — I10 Essential (primary) hypertension: Secondary | ICD-10-CM | POA: Diagnosis not present

## 2020-08-12 DIAGNOSIS — M6281 Muscle weakness (generalized): Secondary | ICD-10-CM | POA: Diagnosis not present

## 2020-08-12 DIAGNOSIS — R296 Repeated falls: Secondary | ICD-10-CM | POA: Diagnosis not present

## 2020-08-16 DIAGNOSIS — R296 Repeated falls: Secondary | ICD-10-CM | POA: Diagnosis not present

## 2020-08-16 DIAGNOSIS — A048 Other specified bacterial intestinal infections: Secondary | ICD-10-CM | POA: Diagnosis not present

## 2020-08-16 DIAGNOSIS — D638 Anemia in other chronic diseases classified elsewhere: Secondary | ICD-10-CM | POA: Diagnosis not present

## 2020-08-16 DIAGNOSIS — M6281 Muscle weakness (generalized): Secondary | ICD-10-CM | POA: Diagnosis not present

## 2020-08-16 DIAGNOSIS — I10 Essential (primary) hypertension: Secondary | ICD-10-CM | POA: Diagnosis not present

## 2020-08-16 DIAGNOSIS — R2681 Unsteadiness on feet: Secondary | ICD-10-CM | POA: Diagnosis not present

## 2020-08-17 DIAGNOSIS — R296 Repeated falls: Secondary | ICD-10-CM | POA: Diagnosis not present

## 2020-08-17 DIAGNOSIS — R2681 Unsteadiness on feet: Secondary | ICD-10-CM | POA: Diagnosis not present

## 2020-08-17 DIAGNOSIS — I10 Essential (primary) hypertension: Secondary | ICD-10-CM | POA: Diagnosis not present

## 2020-08-17 DIAGNOSIS — A048 Other specified bacterial intestinal infections: Secondary | ICD-10-CM | POA: Diagnosis not present

## 2020-08-17 DIAGNOSIS — M6281 Muscle weakness (generalized): Secondary | ICD-10-CM | POA: Diagnosis not present

## 2020-08-17 DIAGNOSIS — D638 Anemia in other chronic diseases classified elsewhere: Secondary | ICD-10-CM | POA: Diagnosis not present

## 2020-08-18 DIAGNOSIS — R2681 Unsteadiness on feet: Secondary | ICD-10-CM | POA: Diagnosis not present

## 2020-08-18 DIAGNOSIS — M6281 Muscle weakness (generalized): Secondary | ICD-10-CM | POA: Diagnosis not present

## 2020-08-18 DIAGNOSIS — I10 Essential (primary) hypertension: Secondary | ICD-10-CM | POA: Diagnosis not present

## 2020-08-18 DIAGNOSIS — A048 Other specified bacterial intestinal infections: Secondary | ICD-10-CM | POA: Diagnosis not present

## 2020-08-18 DIAGNOSIS — D638 Anemia in other chronic diseases classified elsewhere: Secondary | ICD-10-CM | POA: Diagnosis not present

## 2020-08-18 DIAGNOSIS — R296 Repeated falls: Secondary | ICD-10-CM | POA: Diagnosis not present

## 2020-08-23 DIAGNOSIS — D649 Anemia, unspecified: Secondary | ICD-10-CM | POA: Diagnosis not present

## 2020-08-23 DIAGNOSIS — A048 Other specified bacterial intestinal infections: Secondary | ICD-10-CM | POA: Diagnosis not present

## 2020-08-23 DIAGNOSIS — R2681 Unsteadiness on feet: Secondary | ICD-10-CM | POA: Diagnosis not present

## 2020-08-23 DIAGNOSIS — R278 Other lack of coordination: Secondary | ICD-10-CM | POA: Diagnosis not present

## 2020-08-23 DIAGNOSIS — I10 Essential (primary) hypertension: Secondary | ICD-10-CM | POA: Diagnosis not present

## 2020-08-23 DIAGNOSIS — R296 Repeated falls: Secondary | ICD-10-CM | POA: Diagnosis not present

## 2020-08-23 DIAGNOSIS — M6281 Muscle weakness (generalized): Secondary | ICD-10-CM | POA: Diagnosis not present

## 2020-08-23 DIAGNOSIS — R41841 Cognitive communication deficit: Secondary | ICD-10-CM | POA: Diagnosis not present

## 2020-08-23 DIAGNOSIS — D638 Anemia in other chronic diseases classified elsewhere: Secondary | ICD-10-CM | POA: Diagnosis not present

## 2020-08-24 DIAGNOSIS — D638 Anemia in other chronic diseases classified elsewhere: Secondary | ICD-10-CM | POA: Diagnosis not present

## 2020-08-24 DIAGNOSIS — M6281 Muscle weakness (generalized): Secondary | ICD-10-CM | POA: Diagnosis not present

## 2020-08-24 DIAGNOSIS — A048 Other specified bacterial intestinal infections: Secondary | ICD-10-CM | POA: Diagnosis not present

## 2020-08-24 DIAGNOSIS — R2681 Unsteadiness on feet: Secondary | ICD-10-CM | POA: Diagnosis not present

## 2020-08-24 DIAGNOSIS — I10 Essential (primary) hypertension: Secondary | ICD-10-CM | POA: Diagnosis not present

## 2020-08-24 DIAGNOSIS — R296 Repeated falls: Secondary | ICD-10-CM | POA: Diagnosis not present

## 2020-08-26 DIAGNOSIS — M6281 Muscle weakness (generalized): Secondary | ICD-10-CM | POA: Diagnosis not present

## 2020-08-26 DIAGNOSIS — R296 Repeated falls: Secondary | ICD-10-CM | POA: Diagnosis not present

## 2020-08-26 DIAGNOSIS — A048 Other specified bacterial intestinal infections: Secondary | ICD-10-CM | POA: Diagnosis not present

## 2020-08-26 DIAGNOSIS — I10 Essential (primary) hypertension: Secondary | ICD-10-CM | POA: Diagnosis not present

## 2020-08-26 DIAGNOSIS — D638 Anemia in other chronic diseases classified elsewhere: Secondary | ICD-10-CM | POA: Diagnosis not present

## 2020-08-26 DIAGNOSIS — R2681 Unsteadiness on feet: Secondary | ICD-10-CM | POA: Diagnosis not present

## 2020-08-30 DIAGNOSIS — M6281 Muscle weakness (generalized): Secondary | ICD-10-CM | POA: Diagnosis not present

## 2020-08-30 DIAGNOSIS — D638 Anemia in other chronic diseases classified elsewhere: Secondary | ICD-10-CM | POA: Diagnosis not present

## 2020-08-30 DIAGNOSIS — R296 Repeated falls: Secondary | ICD-10-CM | POA: Diagnosis not present

## 2020-08-30 DIAGNOSIS — A048 Other specified bacterial intestinal infections: Secondary | ICD-10-CM | POA: Diagnosis not present

## 2020-08-30 DIAGNOSIS — I10 Essential (primary) hypertension: Secondary | ICD-10-CM | POA: Diagnosis not present

## 2020-08-30 DIAGNOSIS — R2681 Unsteadiness on feet: Secondary | ICD-10-CM | POA: Diagnosis not present

## 2020-08-31 DIAGNOSIS — A048 Other specified bacterial intestinal infections: Secondary | ICD-10-CM | POA: Diagnosis not present

## 2020-08-31 DIAGNOSIS — D638 Anemia in other chronic diseases classified elsewhere: Secondary | ICD-10-CM | POA: Diagnosis not present

## 2020-08-31 DIAGNOSIS — R2681 Unsteadiness on feet: Secondary | ICD-10-CM | POA: Diagnosis not present

## 2020-08-31 DIAGNOSIS — R296 Repeated falls: Secondary | ICD-10-CM | POA: Diagnosis not present

## 2020-08-31 DIAGNOSIS — I10 Essential (primary) hypertension: Secondary | ICD-10-CM | POA: Diagnosis not present

## 2020-08-31 DIAGNOSIS — M6281 Muscle weakness (generalized): Secondary | ICD-10-CM | POA: Diagnosis not present

## 2020-09-02 DIAGNOSIS — M6281 Muscle weakness (generalized): Secondary | ICD-10-CM | POA: Diagnosis not present

## 2020-09-02 DIAGNOSIS — I10 Essential (primary) hypertension: Secondary | ICD-10-CM | POA: Diagnosis not present

## 2020-09-02 DIAGNOSIS — R2681 Unsteadiness on feet: Secondary | ICD-10-CM | POA: Diagnosis not present

## 2020-09-02 DIAGNOSIS — A048 Other specified bacterial intestinal infections: Secondary | ICD-10-CM | POA: Diagnosis not present

## 2020-09-02 DIAGNOSIS — R296 Repeated falls: Secondary | ICD-10-CM | POA: Diagnosis not present

## 2020-09-02 DIAGNOSIS — D638 Anemia in other chronic diseases classified elsewhere: Secondary | ICD-10-CM | POA: Diagnosis not present

## 2020-09-06 DIAGNOSIS — R2681 Unsteadiness on feet: Secondary | ICD-10-CM | POA: Diagnosis not present

## 2020-09-06 DIAGNOSIS — M6281 Muscle weakness (generalized): Secondary | ICD-10-CM | POA: Diagnosis not present

## 2020-09-06 DIAGNOSIS — A048 Other specified bacterial intestinal infections: Secondary | ICD-10-CM | POA: Diagnosis not present

## 2020-09-06 DIAGNOSIS — R296 Repeated falls: Secondary | ICD-10-CM | POA: Diagnosis not present

## 2020-09-06 DIAGNOSIS — I10 Essential (primary) hypertension: Secondary | ICD-10-CM | POA: Diagnosis not present

## 2020-09-06 DIAGNOSIS — D638 Anemia in other chronic diseases classified elsewhere: Secondary | ICD-10-CM | POA: Diagnosis not present

## 2020-09-07 DIAGNOSIS — M6281 Muscle weakness (generalized): Secondary | ICD-10-CM | POA: Diagnosis not present

## 2020-09-07 DIAGNOSIS — R296 Repeated falls: Secondary | ICD-10-CM | POA: Diagnosis not present

## 2020-09-07 DIAGNOSIS — R2681 Unsteadiness on feet: Secondary | ICD-10-CM | POA: Diagnosis not present

## 2020-09-07 DIAGNOSIS — I10 Essential (primary) hypertension: Secondary | ICD-10-CM | POA: Diagnosis not present

## 2020-09-07 DIAGNOSIS — A048 Other specified bacterial intestinal infections: Secondary | ICD-10-CM | POA: Diagnosis not present

## 2020-09-07 DIAGNOSIS — D638 Anemia in other chronic diseases classified elsewhere: Secondary | ICD-10-CM | POA: Diagnosis not present

## 2020-09-08 DIAGNOSIS — I10 Essential (primary) hypertension: Secondary | ICD-10-CM | POA: Diagnosis not present

## 2020-09-08 DIAGNOSIS — F0390 Unspecified dementia without behavioral disturbance: Secondary | ICD-10-CM | POA: Diagnosis not present

## 2020-09-08 DIAGNOSIS — M6281 Muscle weakness (generalized): Secondary | ICD-10-CM | POA: Diagnosis not present

## 2020-09-08 DIAGNOSIS — F339 Major depressive disorder, recurrent, unspecified: Secondary | ICD-10-CM | POA: Diagnosis not present

## 2020-09-09 DIAGNOSIS — I10 Essential (primary) hypertension: Secondary | ICD-10-CM | POA: Diagnosis not present

## 2020-09-09 DIAGNOSIS — R2681 Unsteadiness on feet: Secondary | ICD-10-CM | POA: Diagnosis not present

## 2020-09-09 DIAGNOSIS — A048 Other specified bacterial intestinal infections: Secondary | ICD-10-CM | POA: Diagnosis not present

## 2020-09-09 DIAGNOSIS — M6281 Muscle weakness (generalized): Secondary | ICD-10-CM | POA: Diagnosis not present

## 2020-09-09 DIAGNOSIS — R296 Repeated falls: Secondary | ICD-10-CM | POA: Diagnosis not present

## 2020-09-09 DIAGNOSIS — D638 Anemia in other chronic diseases classified elsewhere: Secondary | ICD-10-CM | POA: Diagnosis not present

## 2020-09-13 DIAGNOSIS — A048 Other specified bacterial intestinal infections: Secondary | ICD-10-CM | POA: Diagnosis not present

## 2020-09-13 DIAGNOSIS — I10 Essential (primary) hypertension: Secondary | ICD-10-CM | POA: Diagnosis not present

## 2020-09-13 DIAGNOSIS — R296 Repeated falls: Secondary | ICD-10-CM | POA: Diagnosis not present

## 2020-09-13 DIAGNOSIS — D638 Anemia in other chronic diseases classified elsewhere: Secondary | ICD-10-CM | POA: Diagnosis not present

## 2020-09-13 DIAGNOSIS — M6281 Muscle weakness (generalized): Secondary | ICD-10-CM | POA: Diagnosis not present

## 2020-09-13 DIAGNOSIS — R2681 Unsteadiness on feet: Secondary | ICD-10-CM | POA: Diagnosis not present

## 2020-09-14 DIAGNOSIS — I10 Essential (primary) hypertension: Secondary | ICD-10-CM | POA: Diagnosis not present

## 2020-09-14 DIAGNOSIS — M6281 Muscle weakness (generalized): Secondary | ICD-10-CM | POA: Diagnosis not present

## 2020-09-14 DIAGNOSIS — D638 Anemia in other chronic diseases classified elsewhere: Secondary | ICD-10-CM | POA: Diagnosis not present

## 2020-09-14 DIAGNOSIS — R296 Repeated falls: Secondary | ICD-10-CM | POA: Diagnosis not present

## 2020-09-14 DIAGNOSIS — A048 Other specified bacterial intestinal infections: Secondary | ICD-10-CM | POA: Diagnosis not present

## 2020-09-14 DIAGNOSIS — R2681 Unsteadiness on feet: Secondary | ICD-10-CM | POA: Diagnosis not present

## 2020-09-15 DIAGNOSIS — A048 Other specified bacterial intestinal infections: Secondary | ICD-10-CM | POA: Diagnosis not present

## 2020-09-15 DIAGNOSIS — D638 Anemia in other chronic diseases classified elsewhere: Secondary | ICD-10-CM | POA: Diagnosis not present

## 2020-09-15 DIAGNOSIS — I10 Essential (primary) hypertension: Secondary | ICD-10-CM | POA: Diagnosis not present

## 2020-09-15 DIAGNOSIS — R2681 Unsteadiness on feet: Secondary | ICD-10-CM | POA: Diagnosis not present

## 2020-09-15 DIAGNOSIS — M6281 Muscle weakness (generalized): Secondary | ICD-10-CM | POA: Diagnosis not present

## 2020-09-15 DIAGNOSIS — R296 Repeated falls: Secondary | ICD-10-CM | POA: Diagnosis not present

## 2020-09-16 DIAGNOSIS — D638 Anemia in other chronic diseases classified elsewhere: Secondary | ICD-10-CM | POA: Diagnosis not present

## 2020-09-16 DIAGNOSIS — M6281 Muscle weakness (generalized): Secondary | ICD-10-CM | POA: Diagnosis not present

## 2020-09-16 DIAGNOSIS — R2681 Unsteadiness on feet: Secondary | ICD-10-CM | POA: Diagnosis not present

## 2020-09-16 DIAGNOSIS — R296 Repeated falls: Secondary | ICD-10-CM | POA: Diagnosis not present

## 2020-09-16 DIAGNOSIS — A048 Other specified bacterial intestinal infections: Secondary | ICD-10-CM | POA: Diagnosis not present

## 2020-09-16 DIAGNOSIS — I10 Essential (primary) hypertension: Secondary | ICD-10-CM | POA: Diagnosis not present

## 2020-09-17 DIAGNOSIS — U071 COVID-19: Secondary | ICD-10-CM | POA: Diagnosis not present

## 2020-09-21 DIAGNOSIS — R41841 Cognitive communication deficit: Secondary | ICD-10-CM | POA: Diagnosis not present

## 2020-09-21 DIAGNOSIS — R2681 Unsteadiness on feet: Secondary | ICD-10-CM | POA: Diagnosis not present

## 2020-09-21 DIAGNOSIS — A048 Other specified bacterial intestinal infections: Secondary | ICD-10-CM | POA: Diagnosis not present

## 2020-09-21 DIAGNOSIS — D649 Anemia, unspecified: Secondary | ICD-10-CM | POA: Diagnosis not present

## 2020-09-21 DIAGNOSIS — R278 Other lack of coordination: Secondary | ICD-10-CM | POA: Diagnosis not present

## 2020-09-21 DIAGNOSIS — D638 Anemia in other chronic diseases classified elsewhere: Secondary | ICD-10-CM | POA: Diagnosis not present

## 2020-09-21 DIAGNOSIS — M6281 Muscle weakness (generalized): Secondary | ICD-10-CM | POA: Diagnosis not present

## 2020-09-21 DIAGNOSIS — I10 Essential (primary) hypertension: Secondary | ICD-10-CM | POA: Diagnosis not present

## 2020-09-21 DIAGNOSIS — R296 Repeated falls: Secondary | ICD-10-CM | POA: Diagnosis not present

## 2020-09-26 DIAGNOSIS — M6281 Muscle weakness (generalized): Secondary | ICD-10-CM | POA: Diagnosis not present

## 2020-09-26 DIAGNOSIS — A048 Other specified bacterial intestinal infections: Secondary | ICD-10-CM | POA: Diagnosis not present

## 2020-09-26 DIAGNOSIS — D638 Anemia in other chronic diseases classified elsewhere: Secondary | ICD-10-CM | POA: Diagnosis not present

## 2020-09-26 DIAGNOSIS — R2681 Unsteadiness on feet: Secondary | ICD-10-CM | POA: Diagnosis not present

## 2020-09-26 DIAGNOSIS — I10 Essential (primary) hypertension: Secondary | ICD-10-CM | POA: Diagnosis not present

## 2020-09-26 DIAGNOSIS — R296 Repeated falls: Secondary | ICD-10-CM | POA: Diagnosis not present

## 2020-09-27 DIAGNOSIS — R634 Abnormal weight loss: Secondary | ICD-10-CM | POA: Diagnosis not present

## 2020-09-27 DIAGNOSIS — A048 Other specified bacterial intestinal infections: Secondary | ICD-10-CM | POA: Diagnosis not present

## 2020-09-27 DIAGNOSIS — R2681 Unsteadiness on feet: Secondary | ICD-10-CM | POA: Diagnosis not present

## 2020-09-27 DIAGNOSIS — R296 Repeated falls: Secondary | ICD-10-CM | POA: Diagnosis not present

## 2020-09-27 DIAGNOSIS — I10 Essential (primary) hypertension: Secondary | ICD-10-CM | POA: Diagnosis not present

## 2020-09-27 DIAGNOSIS — F39 Unspecified mood [affective] disorder: Secondary | ICD-10-CM | POA: Diagnosis not present

## 2020-09-27 DIAGNOSIS — F330 Major depressive disorder, recurrent, mild: Secondary | ICD-10-CM | POA: Diagnosis not present

## 2020-09-27 DIAGNOSIS — M6281 Muscle weakness (generalized): Secondary | ICD-10-CM | POA: Diagnosis not present

## 2020-09-27 DIAGNOSIS — D638 Anemia in other chronic diseases classified elsewhere: Secondary | ICD-10-CM | POA: Diagnosis not present

## 2020-09-28 DIAGNOSIS — R296 Repeated falls: Secondary | ICD-10-CM | POA: Diagnosis not present

## 2020-09-28 DIAGNOSIS — I10 Essential (primary) hypertension: Secondary | ICD-10-CM | POA: Diagnosis not present

## 2020-09-28 DIAGNOSIS — M6281 Muscle weakness (generalized): Secondary | ICD-10-CM | POA: Diagnosis not present

## 2020-09-28 DIAGNOSIS — A048 Other specified bacterial intestinal infections: Secondary | ICD-10-CM | POA: Diagnosis not present

## 2020-09-28 DIAGNOSIS — D638 Anemia in other chronic diseases classified elsewhere: Secondary | ICD-10-CM | POA: Diagnosis not present

## 2020-09-28 DIAGNOSIS — R2681 Unsteadiness on feet: Secondary | ICD-10-CM | POA: Diagnosis not present

## 2020-09-29 DIAGNOSIS — R2681 Unsteadiness on feet: Secondary | ICD-10-CM | POA: Diagnosis not present

## 2020-09-29 DIAGNOSIS — R296 Repeated falls: Secondary | ICD-10-CM | POA: Diagnosis not present

## 2020-09-29 DIAGNOSIS — I10 Essential (primary) hypertension: Secondary | ICD-10-CM | POA: Diagnosis not present

## 2020-09-29 DIAGNOSIS — M6281 Muscle weakness (generalized): Secondary | ICD-10-CM | POA: Diagnosis not present

## 2020-09-29 DIAGNOSIS — D638 Anemia in other chronic diseases classified elsewhere: Secondary | ICD-10-CM | POA: Diagnosis not present

## 2020-09-29 DIAGNOSIS — A048 Other specified bacterial intestinal infections: Secondary | ICD-10-CM | POA: Diagnosis not present

## 2020-09-30 DIAGNOSIS — M6281 Muscle weakness (generalized): Secondary | ICD-10-CM | POA: Diagnosis not present

## 2020-09-30 DIAGNOSIS — D638 Anemia in other chronic diseases classified elsewhere: Secondary | ICD-10-CM | POA: Diagnosis not present

## 2020-09-30 DIAGNOSIS — A048 Other specified bacterial intestinal infections: Secondary | ICD-10-CM | POA: Diagnosis not present

## 2020-09-30 DIAGNOSIS — R296 Repeated falls: Secondary | ICD-10-CM | POA: Diagnosis not present

## 2020-09-30 DIAGNOSIS — R2681 Unsteadiness on feet: Secondary | ICD-10-CM | POA: Diagnosis not present

## 2020-09-30 DIAGNOSIS — I10 Essential (primary) hypertension: Secondary | ICD-10-CM | POA: Diagnosis not present

## 2020-10-01 ENCOUNTER — Other Ambulatory Visit: Payer: Self-pay

## 2020-10-01 ENCOUNTER — Non-Acute Institutional Stay: Payer: Medicare Other | Admitting: Adult Health Nurse Practitioner

## 2020-10-01 DIAGNOSIS — Z515 Encounter for palliative care: Secondary | ICD-10-CM | POA: Diagnosis not present

## 2020-10-01 DIAGNOSIS — F0391 Unspecified dementia with behavioral disturbance: Secondary | ICD-10-CM | POA: Diagnosis not present

## 2020-10-01 NOTE — Progress Notes (Signed)
[facility] Consult Note Telephone: [phone]  Fax: [phone]  PATIENT NAME: [name] DOB: [birth year] MRN: [id number]  PRIMARY CARE PROVIDER:   Dr. Yves Dill PROVIDER:  Dr. Nyra Capes  RESPONSIBLE PARTY:   Self [phone] Emergency contact - Daughter- Carroll Sage [phone]  Chief complaint:  Follow up palliative visit/ dementia    RECOMMENDATIONS and PLAN:  1.  Advanced care planning. Patient is DNR. Spoke with daughter via phone and updated on today's visit.   2.  Dementia.  Patient is wheelchair-bound.  She is able to propel herself in her wheelchair and she is able to perform transfers from wheelchair to bed by herself.  Does require assistance with ADLs such as bathing and dressing.  No reported changes in appetite and weight is stable.  Continue supportive care at facility  Palliative will continue to follow for symptom management/decline and make recommendations as needed.  Follow-up in 8 to 10 weeks.  Daughter encouraged to call with any concerns or questions.  I spent 35 minutes providing this consultation, including time spent with patient/family, provider coordination, chart review, documentation. More than 50% of the time in this consultation was spent coordinating communication.   HISTORY OF PRESENT ILLNESS:  [name] is a 87 y.o. year old female with multiple medical problems including GERD chronic low back pain, HTN Hypothyroidism, dementia, sundowning. Palliative Care was asked to help address goals of care.  Patient just got out of quarantine a few days ago after testing positive for Covid.  Patient did not have any severe symptoms related to Covid.  Patient does state feeling tired today and was taking a nap when provider came into the room.  Patient did not have any new concerns though HPI/ROS may be unreliable secondary to dementia.  Daughter and staff did not have  any new concerns today.  Patient has not had any falls or hospitalizations since last visit.  CODE STATUS: DNR  PPS: 40% HOSPICE ELIGIBILITY/DIAGNOSIS: TBD  PHYSICAL EXAM: HR 81 O2 97% on RA General: NAD, frail appearing, thin Eyes: Sclera anicteric and noninjected with no discharge noted ENMT: Moist mucous membranes Cardiovascular:Irregularlyregular rate and rhythm Pulmonary:lung sounds clear; normal respiratory effort Abdomen: soft, nontender, + bowel sounds Extremities:noedema, no joint deformities Skin: no rasheson exposed skin Neurological: Weakness;  alert and oriented to person and place  PAST MEDICAL HISTORY:  Past Medical History:  Diagnosis Date  . Anemia   . Anginal pain ([place])   . Arthritis    "all over; doesn't seem to bother me much" (08/22/2013)  . Chronic lower back pain   . Dysrhythmia    "irregular heart beat"   . Exertional shortness of breath   . GERD (gastroesophageal reflux disease)   . Hypertension   . Hypothyroidism   . Migraines    "used to"  . Mixed hyperlipidemia   . Osteoporosis   . Paroxysmal supraventricular tachycardia ([place])   . Skin cancer    "couple taken off right leg" (08/22/2013)    SOCIAL HX:  Social History   Tobacco Use  . Smoking status: Never Smoker  . Smokeless tobacco: Never Used  Substance Use Topics  . Alcohol use: No    ALLERGIES:  Allergies  Allergen Reactions  . Codeine Other (See Comments)    "I feel like I'm floating"   . Gabapentin Other (See Comments)    Cannot be on for a good length of time-  causes a lot of lethargy  . Tramadol Other (See Comments)    On MAR  . Other Other (See Comments)    All pain meds cause lethargy and disorientation     PERTINENT MEDICATIONS:  Outpatient Encounter Medications as of 10/01/2020  Medication Sig  . aspirin EC 81 MG tablet Take 1 tablet (81 mg total) by mouth daily. (Patient not taking: Reported on 01/27/2020)  . benzonatate (TESSALON) 100 MG capsule Take 1  capsule (100 mg total) by mouth 3 (three) times daily as needed for cough. (Patient not taking: Reported on 01/18/2020)  . bimatoprost (LUMIGAN) 0.01 % SOLN Place 1 drop into both eyes at bedtime.  . brimonidine (ALPHAGAN) 0.15 % ophthalmic solution Place 1 drop into both eyes 3 (three) times daily.   . Calcium 600-200 MG-UNIT tablet Take 2 tablets by mouth daily.   . cloNIDine (CATAPRES) 0.1 MG tablet Take 0.1 mg by mouth daily as needed (for SBP>180).  . Colesevelam HCl (WELCHOL) 3.75 g PACK Take 3.75 g by mouth daily.  Marland Kitchen diltiazem (CARDIZEM CD) 120 MG 24 hr capsule Take 120 mg by mouth daily.   . dorzolamide-timolol (COSOPT) 22.3-6.8 MG/ML ophthalmic solution Place 1 drop into the left eye 2 (two) times daily.  . feeding supplement, ENSURE ENLIVE, (ENSURE ENLIVE) LIQD Take 237 mLs by mouth 2 (two) times daily between meals. (Patient not taking: Reported on 01/18/2020)  . ferrous sulfate 325 (65 FE) MG EC tablet Take 325 mg by mouth daily with breakfast.   . fexofenadine (ALLEGRA) 60 MG tablet Take 60 mg by mouth 2 (two) times daily.  Marland Kitchen lactose free nutrition (BOOST) LIQD Take 237 mLs by mouth daily.  Marland Kitchen levothyroxine (SYNTHROID) 25 MCG tablet Take 25 mcg by mouth every other day. Alternating with 82mcg  . levothyroxine (SYNTHROID, LEVOTHROID) 50 MCG tablet Take 50 mcg by mouth every other day.   . Lidocaine HCl (ASPERCREME LIDOCAINE) 4 % CREA Apply topically in the morning and at bedtime. To affected area of mid-back  . lisinopril (ZESTRIL) 20 MG tablet Take 20 mg by mouth daily.  Marland Kitchen loperamide (IMODIUM A-D) 2 MG tablet Take 2 mg by mouth daily as needed for diarrhea or loose stools.  . meclizine (ANTIVERT) 25 MG tablet Take 25 mg by mouth 3 (three) times daily as needed for dizziness.  . metoprolol (LOPRESSOR) 50 MG tablet Take 50 mg by mouth 2 (two) times daily.  . mirtazapine (REMERON) 15 MG tablet Take 15 mg by mouth at bedtime.  . nitroGLYCERIN (NITROSTAT) 0.4 MG SL tablet Place 1 tablet (0.4  mg total) under the tongue every 5 (five) minutes as needed for chest pain.  . Omega-3 Fatty Acids (FISH OIL) 1000 MG CAPS Take 2,000 mg by mouth daily.   Marland Kitchen omeprazole (PRILOSEC) 20 MG capsule Take 20 mg by mouth daily.  . pantoprazole (PROTONIX) 40 MG tablet Take 1 tablet (40 mg total) by mouth 2 (two) times daily before a meal. Take 2 times daily for 2 weeks, then 1 tablet daily/ (Patient taking differently: Take 40 mg by mouth daily. then 1 tablet daily)  . Probiotic Product (PROBIOTIC-10 PO) Take 1 capsule by mouth in the morning and at bedtime.  . sertraline (ZOLOFT) 100 MG tablet Take 100 mg by mouth daily.    No facility-administered encounter medications on file as of 10/01/2020.     Arria Naim Jenetta Downer, NP

## 2020-10-04 DIAGNOSIS — R296 Repeated falls: Secondary | ICD-10-CM | POA: Diagnosis not present

## 2020-10-04 DIAGNOSIS — D638 Anemia in other chronic diseases classified elsewhere: Secondary | ICD-10-CM | POA: Diagnosis not present

## 2020-10-04 DIAGNOSIS — A048 Other specified bacterial intestinal infections: Secondary | ICD-10-CM | POA: Diagnosis not present

## 2020-10-04 DIAGNOSIS — M6281 Muscle weakness (generalized): Secondary | ICD-10-CM | POA: Diagnosis not present

## 2020-10-04 DIAGNOSIS — I10 Essential (primary) hypertension: Secondary | ICD-10-CM | POA: Diagnosis not present

## 2020-10-04 DIAGNOSIS — R2681 Unsteadiness on feet: Secondary | ICD-10-CM | POA: Diagnosis not present

## 2020-10-06 DIAGNOSIS — R296 Repeated falls: Secondary | ICD-10-CM | POA: Diagnosis not present

## 2020-10-06 DIAGNOSIS — M6281 Muscle weakness (generalized): Secondary | ICD-10-CM | POA: Diagnosis not present

## 2020-10-06 DIAGNOSIS — I10 Essential (primary) hypertension: Secondary | ICD-10-CM | POA: Diagnosis not present

## 2020-10-06 DIAGNOSIS — R2681 Unsteadiness on feet: Secondary | ICD-10-CM | POA: Diagnosis not present

## 2020-10-06 DIAGNOSIS — D638 Anemia in other chronic diseases classified elsewhere: Secondary | ICD-10-CM | POA: Diagnosis not present

## 2020-10-06 DIAGNOSIS — A048 Other specified bacterial intestinal infections: Secondary | ICD-10-CM | POA: Diagnosis not present

## 2020-10-07 DIAGNOSIS — I10 Essential (primary) hypertension: Secondary | ICD-10-CM | POA: Diagnosis not present

## 2020-10-07 DIAGNOSIS — R2681 Unsteadiness on feet: Secondary | ICD-10-CM | POA: Diagnosis not present

## 2020-10-07 DIAGNOSIS — R296 Repeated falls: Secondary | ICD-10-CM | POA: Diagnosis not present

## 2020-10-07 DIAGNOSIS — A048 Other specified bacterial intestinal infections: Secondary | ICD-10-CM | POA: Diagnosis not present

## 2020-10-07 DIAGNOSIS — D638 Anemia in other chronic diseases classified elsewhere: Secondary | ICD-10-CM | POA: Diagnosis not present

## 2020-10-07 DIAGNOSIS — M6281 Muscle weakness (generalized): Secondary | ICD-10-CM | POA: Diagnosis not present

## 2020-10-11 DIAGNOSIS — D638 Anemia in other chronic diseases classified elsewhere: Secondary | ICD-10-CM | POA: Diagnosis not present

## 2020-10-11 DIAGNOSIS — A048 Other specified bacterial intestinal infections: Secondary | ICD-10-CM | POA: Diagnosis not present

## 2020-10-11 DIAGNOSIS — M6281 Muscle weakness (generalized): Secondary | ICD-10-CM | POA: Diagnosis not present

## 2020-10-11 DIAGNOSIS — R296 Repeated falls: Secondary | ICD-10-CM | POA: Diagnosis not present

## 2020-10-11 DIAGNOSIS — I10 Essential (primary) hypertension: Secondary | ICD-10-CM | POA: Diagnosis not present

## 2020-10-11 DIAGNOSIS — R2681 Unsteadiness on feet: Secondary | ICD-10-CM | POA: Diagnosis not present

## 2020-10-13 DIAGNOSIS — R296 Repeated falls: Secondary | ICD-10-CM | POA: Diagnosis not present

## 2020-10-13 DIAGNOSIS — M6281 Muscle weakness (generalized): Secondary | ICD-10-CM | POA: Diagnosis not present

## 2020-10-13 DIAGNOSIS — I10 Essential (primary) hypertension: Secondary | ICD-10-CM | POA: Diagnosis not present

## 2020-10-13 DIAGNOSIS — D638 Anemia in other chronic diseases classified elsewhere: Secondary | ICD-10-CM | POA: Diagnosis not present

## 2020-10-13 DIAGNOSIS — A048 Other specified bacterial intestinal infections: Secondary | ICD-10-CM | POA: Diagnosis not present

## 2020-10-13 DIAGNOSIS — R2681 Unsteadiness on feet: Secondary | ICD-10-CM | POA: Diagnosis not present

## 2020-10-18 DIAGNOSIS — R296 Repeated falls: Secondary | ICD-10-CM | POA: Diagnosis not present

## 2020-10-18 DIAGNOSIS — M6281 Muscle weakness (generalized): Secondary | ICD-10-CM | POA: Diagnosis not present

## 2020-10-18 DIAGNOSIS — I10 Essential (primary) hypertension: Secondary | ICD-10-CM | POA: Diagnosis not present

## 2020-10-18 DIAGNOSIS — A048 Other specified bacterial intestinal infections: Secondary | ICD-10-CM | POA: Diagnosis not present

## 2020-10-18 DIAGNOSIS — D638 Anemia in other chronic diseases classified elsewhere: Secondary | ICD-10-CM | POA: Diagnosis not present

## 2020-10-18 DIAGNOSIS — R2681 Unsteadiness on feet: Secondary | ICD-10-CM | POA: Diagnosis not present

## 2020-10-19 DIAGNOSIS — R41841 Cognitive communication deficit: Secondary | ICD-10-CM | POA: Diagnosis not present

## 2020-10-19 DIAGNOSIS — E039 Hypothyroidism, unspecified: Secondary | ICD-10-CM | POA: Diagnosis not present

## 2020-10-19 DIAGNOSIS — F339 Major depressive disorder, recurrent, unspecified: Secondary | ICD-10-CM | POA: Diagnosis not present

## 2020-10-19 DIAGNOSIS — M6281 Muscle weakness (generalized): Secondary | ICD-10-CM | POA: Diagnosis not present

## 2020-10-19 DIAGNOSIS — E782 Mixed hyperlipidemia: Secondary | ICD-10-CM | POA: Diagnosis not present

## 2020-10-19 DIAGNOSIS — D638 Anemia in other chronic diseases classified elsewhere: Secondary | ICD-10-CM | POA: Diagnosis not present

## 2020-10-19 DIAGNOSIS — R296 Repeated falls: Secondary | ICD-10-CM | POA: Diagnosis not present

## 2020-10-19 DIAGNOSIS — F0390 Unspecified dementia without behavioral disturbance: Secondary | ICD-10-CM | POA: Diagnosis not present

## 2020-10-19 DIAGNOSIS — I10 Essential (primary) hypertension: Secondary | ICD-10-CM | POA: Diagnosis not present

## 2020-10-19 DIAGNOSIS — R278 Other lack of coordination: Secondary | ICD-10-CM | POA: Diagnosis not present

## 2020-10-19 DIAGNOSIS — D649 Anemia, unspecified: Secondary | ICD-10-CM | POA: Diagnosis not present

## 2020-10-19 DIAGNOSIS — R2681 Unsteadiness on feet: Secondary | ICD-10-CM | POA: Diagnosis not present

## 2020-10-19 DIAGNOSIS — A048 Other specified bacterial intestinal infections: Secondary | ICD-10-CM | POA: Diagnosis not present

## 2020-10-22 DIAGNOSIS — I10 Essential (primary) hypertension: Secondary | ICD-10-CM | POA: Diagnosis not present

## 2020-10-22 DIAGNOSIS — D638 Anemia in other chronic diseases classified elsewhere: Secondary | ICD-10-CM | POA: Diagnosis not present

## 2020-10-22 DIAGNOSIS — A048 Other specified bacterial intestinal infections: Secondary | ICD-10-CM | POA: Diagnosis not present

## 2020-10-22 DIAGNOSIS — R2681 Unsteadiness on feet: Secondary | ICD-10-CM | POA: Diagnosis not present

## 2020-10-22 DIAGNOSIS — M6281 Muscle weakness (generalized): Secondary | ICD-10-CM | POA: Diagnosis not present

## 2020-10-22 DIAGNOSIS — R296 Repeated falls: Secondary | ICD-10-CM | POA: Diagnosis not present

## 2020-10-25 DIAGNOSIS — I10 Essential (primary) hypertension: Secondary | ICD-10-CM | POA: Diagnosis not present

## 2020-10-25 DIAGNOSIS — R296 Repeated falls: Secondary | ICD-10-CM | POA: Diagnosis not present

## 2020-10-25 DIAGNOSIS — D638 Anemia in other chronic diseases classified elsewhere: Secondary | ICD-10-CM | POA: Diagnosis not present

## 2020-10-25 DIAGNOSIS — A048 Other specified bacterial intestinal infections: Secondary | ICD-10-CM | POA: Diagnosis not present

## 2020-10-25 DIAGNOSIS — R2681 Unsteadiness on feet: Secondary | ICD-10-CM | POA: Diagnosis not present

## 2020-10-25 DIAGNOSIS — M6281 Muscle weakness (generalized): Secondary | ICD-10-CM | POA: Diagnosis not present

## 2020-10-26 DIAGNOSIS — R2681 Unsteadiness on feet: Secondary | ICD-10-CM | POA: Diagnosis not present

## 2020-10-26 DIAGNOSIS — M6281 Muscle weakness (generalized): Secondary | ICD-10-CM | POA: Diagnosis not present

## 2020-10-26 DIAGNOSIS — R296 Repeated falls: Secondary | ICD-10-CM | POA: Diagnosis not present

## 2020-10-26 DIAGNOSIS — D638 Anemia in other chronic diseases classified elsewhere: Secondary | ICD-10-CM | POA: Diagnosis not present

## 2020-10-26 DIAGNOSIS — A048 Other specified bacterial intestinal infections: Secondary | ICD-10-CM | POA: Diagnosis not present

## 2020-10-26 DIAGNOSIS — I10 Essential (primary) hypertension: Secondary | ICD-10-CM | POA: Diagnosis not present

## 2020-10-28 DIAGNOSIS — A048 Other specified bacterial intestinal infections: Secondary | ICD-10-CM | POA: Diagnosis not present

## 2020-10-28 DIAGNOSIS — I10 Essential (primary) hypertension: Secondary | ICD-10-CM | POA: Diagnosis not present

## 2020-10-28 DIAGNOSIS — M6281 Muscle weakness (generalized): Secondary | ICD-10-CM | POA: Diagnosis not present

## 2020-10-28 DIAGNOSIS — D638 Anemia in other chronic diseases classified elsewhere: Secondary | ICD-10-CM | POA: Diagnosis not present

## 2020-10-28 DIAGNOSIS — R2681 Unsteadiness on feet: Secondary | ICD-10-CM | POA: Diagnosis not present

## 2020-10-28 DIAGNOSIS — R296 Repeated falls: Secondary | ICD-10-CM | POA: Diagnosis not present

## 2020-11-01 DIAGNOSIS — R634 Abnormal weight loss: Secondary | ICD-10-CM | POA: Diagnosis not present

## 2020-11-01 DIAGNOSIS — F330 Major depressive disorder, recurrent, mild: Secondary | ICD-10-CM | POA: Diagnosis not present

## 2020-11-01 DIAGNOSIS — F39 Unspecified mood [affective] disorder: Secondary | ICD-10-CM | POA: Diagnosis not present

## 2020-11-01 DIAGNOSIS — M6281 Muscle weakness (generalized): Secondary | ICD-10-CM | POA: Diagnosis not present

## 2020-11-01 DIAGNOSIS — R2681 Unsteadiness on feet: Secondary | ICD-10-CM | POA: Diagnosis not present

## 2020-11-01 DIAGNOSIS — A048 Other specified bacterial intestinal infections: Secondary | ICD-10-CM | POA: Diagnosis not present

## 2020-11-01 DIAGNOSIS — D638 Anemia in other chronic diseases classified elsewhere: Secondary | ICD-10-CM | POA: Diagnosis not present

## 2020-11-01 DIAGNOSIS — I10 Essential (primary) hypertension: Secondary | ICD-10-CM | POA: Diagnosis not present

## 2020-11-01 DIAGNOSIS — R296 Repeated falls: Secondary | ICD-10-CM | POA: Diagnosis not present

## 2020-11-02 DIAGNOSIS — M6281 Muscle weakness (generalized): Secondary | ICD-10-CM | POA: Diagnosis not present

## 2020-11-02 DIAGNOSIS — R2681 Unsteadiness on feet: Secondary | ICD-10-CM | POA: Diagnosis not present

## 2020-11-02 DIAGNOSIS — R296 Repeated falls: Secondary | ICD-10-CM | POA: Diagnosis not present

## 2020-11-02 DIAGNOSIS — I10 Essential (primary) hypertension: Secondary | ICD-10-CM | POA: Diagnosis not present

## 2020-11-02 DIAGNOSIS — D638 Anemia in other chronic diseases classified elsewhere: Secondary | ICD-10-CM | POA: Diagnosis not present

## 2020-11-02 DIAGNOSIS — A048 Other specified bacterial intestinal infections: Secondary | ICD-10-CM | POA: Diagnosis not present

## 2020-11-04 DIAGNOSIS — R41841 Cognitive communication deficit: Secondary | ICD-10-CM | POA: Diagnosis not present

## 2020-11-05 DIAGNOSIS — R41841 Cognitive communication deficit: Secondary | ICD-10-CM | POA: Diagnosis not present

## 2020-11-05 DIAGNOSIS — N390 Urinary tract infection, site not specified: Secondary | ICD-10-CM | POA: Diagnosis not present

## 2020-11-06 DIAGNOSIS — A048 Other specified bacterial intestinal infections: Secondary | ICD-10-CM | POA: Diagnosis not present

## 2020-11-06 DIAGNOSIS — R296 Repeated falls: Secondary | ICD-10-CM | POA: Diagnosis not present

## 2020-11-06 DIAGNOSIS — I10 Essential (primary) hypertension: Secondary | ICD-10-CM | POA: Diagnosis not present

## 2020-11-06 DIAGNOSIS — R2681 Unsteadiness on feet: Secondary | ICD-10-CM | POA: Diagnosis not present

## 2020-11-06 DIAGNOSIS — D638 Anemia in other chronic diseases classified elsewhere: Secondary | ICD-10-CM | POA: Diagnosis not present

## 2020-11-06 DIAGNOSIS — M6281 Muscle weakness (generalized): Secondary | ICD-10-CM | POA: Diagnosis not present

## 2020-11-08 DIAGNOSIS — M6281 Muscle weakness (generalized): Secondary | ICD-10-CM | POA: Diagnosis not present

## 2020-11-08 DIAGNOSIS — R296 Repeated falls: Secondary | ICD-10-CM | POA: Diagnosis not present

## 2020-11-08 DIAGNOSIS — D638 Anemia in other chronic diseases classified elsewhere: Secondary | ICD-10-CM | POA: Diagnosis not present

## 2020-11-08 DIAGNOSIS — A048 Other specified bacterial intestinal infections: Secondary | ICD-10-CM | POA: Diagnosis not present

## 2020-11-08 DIAGNOSIS — R2681 Unsteadiness on feet: Secondary | ICD-10-CM | POA: Diagnosis not present

## 2020-11-08 DIAGNOSIS — I10 Essential (primary) hypertension: Secondary | ICD-10-CM | POA: Diagnosis not present

## 2020-11-08 DIAGNOSIS — R634 Abnormal weight loss: Secondary | ICD-10-CM | POA: Diagnosis not present

## 2020-11-08 DIAGNOSIS — F330 Major depressive disorder, recurrent, mild: Secondary | ICD-10-CM | POA: Diagnosis not present

## 2020-11-09 DIAGNOSIS — I10 Essential (primary) hypertension: Secondary | ICD-10-CM | POA: Diagnosis not present

## 2020-11-09 DIAGNOSIS — R296 Repeated falls: Secondary | ICD-10-CM | POA: Diagnosis not present

## 2020-11-09 DIAGNOSIS — D638 Anemia in other chronic diseases classified elsewhere: Secondary | ICD-10-CM | POA: Diagnosis not present

## 2020-11-09 DIAGNOSIS — M6281 Muscle weakness (generalized): Secondary | ICD-10-CM | POA: Diagnosis not present

## 2020-11-09 DIAGNOSIS — R2681 Unsteadiness on feet: Secondary | ICD-10-CM | POA: Diagnosis not present

## 2020-11-09 DIAGNOSIS — A048 Other specified bacterial intestinal infections: Secondary | ICD-10-CM | POA: Diagnosis not present

## 2020-11-11 DIAGNOSIS — R2681 Unsteadiness on feet: Secondary | ICD-10-CM | POA: Diagnosis not present

## 2020-11-11 DIAGNOSIS — D638 Anemia in other chronic diseases classified elsewhere: Secondary | ICD-10-CM | POA: Diagnosis not present

## 2020-11-11 DIAGNOSIS — R296 Repeated falls: Secondary | ICD-10-CM | POA: Diagnosis not present

## 2020-11-11 DIAGNOSIS — M6281 Muscle weakness (generalized): Secondary | ICD-10-CM | POA: Diagnosis not present

## 2020-11-11 DIAGNOSIS — A048 Other specified bacterial intestinal infections: Secondary | ICD-10-CM | POA: Diagnosis not present

## 2020-11-11 DIAGNOSIS — I10 Essential (primary) hypertension: Secondary | ICD-10-CM | POA: Diagnosis not present

## 2020-11-15 DIAGNOSIS — R296 Repeated falls: Secondary | ICD-10-CM | POA: Diagnosis not present

## 2020-11-15 DIAGNOSIS — I10 Essential (primary) hypertension: Secondary | ICD-10-CM | POA: Diagnosis not present

## 2020-11-15 DIAGNOSIS — D638 Anemia in other chronic diseases classified elsewhere: Secondary | ICD-10-CM | POA: Diagnosis not present

## 2020-11-15 DIAGNOSIS — A048 Other specified bacterial intestinal infections: Secondary | ICD-10-CM | POA: Diagnosis not present

## 2020-11-15 DIAGNOSIS — R2681 Unsteadiness on feet: Secondary | ICD-10-CM | POA: Diagnosis not present

## 2020-11-15 DIAGNOSIS — M6281 Muscle weakness (generalized): Secondary | ICD-10-CM | POA: Diagnosis not present

## 2020-11-16 DIAGNOSIS — A048 Other specified bacterial intestinal infections: Secondary | ICD-10-CM | POA: Diagnosis not present

## 2020-11-16 DIAGNOSIS — R2681 Unsteadiness on feet: Secondary | ICD-10-CM | POA: Diagnosis not present

## 2020-11-16 DIAGNOSIS — L84 Corns and callosities: Secondary | ICD-10-CM | POA: Diagnosis not present

## 2020-11-16 DIAGNOSIS — I739 Peripheral vascular disease, unspecified: Secondary | ICD-10-CM | POA: Diagnosis not present

## 2020-11-16 DIAGNOSIS — B351 Tinea unguium: Secondary | ICD-10-CM | POA: Diagnosis not present

## 2020-11-16 DIAGNOSIS — D638 Anemia in other chronic diseases classified elsewhere: Secondary | ICD-10-CM | POA: Diagnosis not present

## 2020-11-16 DIAGNOSIS — M6281 Muscle weakness (generalized): Secondary | ICD-10-CM | POA: Diagnosis not present

## 2020-11-16 DIAGNOSIS — R296 Repeated falls: Secondary | ICD-10-CM | POA: Diagnosis not present

## 2020-11-16 DIAGNOSIS — I10 Essential (primary) hypertension: Secondary | ICD-10-CM | POA: Diagnosis not present

## 2020-11-16 DIAGNOSIS — L603 Nail dystrophy: Secondary | ICD-10-CM | POA: Diagnosis not present

## 2020-11-18 DIAGNOSIS — K121 Other forms of stomatitis: Secondary | ICD-10-CM | POA: Diagnosis not present

## 2020-11-18 DIAGNOSIS — M6281 Muscle weakness (generalized): Secondary | ICD-10-CM | POA: Diagnosis not present

## 2020-11-18 DIAGNOSIS — R296 Repeated falls: Secondary | ICD-10-CM | POA: Diagnosis not present

## 2020-11-18 DIAGNOSIS — R2681 Unsteadiness on feet: Secondary | ICD-10-CM | POA: Diagnosis not present

## 2020-11-18 DIAGNOSIS — I10 Essential (primary) hypertension: Secondary | ICD-10-CM | POA: Diagnosis not present

## 2020-11-18 DIAGNOSIS — D638 Anemia in other chronic diseases classified elsewhere: Secondary | ICD-10-CM | POA: Diagnosis not present

## 2020-11-18 DIAGNOSIS — A048 Other specified bacterial intestinal infections: Secondary | ICD-10-CM | POA: Diagnosis not present

## 2020-11-22 DIAGNOSIS — R634 Abnormal weight loss: Secondary | ICD-10-CM | POA: Diagnosis not present

## 2020-11-22 DIAGNOSIS — R296 Repeated falls: Secondary | ICD-10-CM | POA: Diagnosis not present

## 2020-11-22 DIAGNOSIS — R41841 Cognitive communication deficit: Secondary | ICD-10-CM | POA: Diagnosis not present

## 2020-11-22 DIAGNOSIS — F39 Unspecified mood [affective] disorder: Secondary | ICD-10-CM | POA: Diagnosis not present

## 2020-11-22 DIAGNOSIS — M6281 Muscle weakness (generalized): Secondary | ICD-10-CM | POA: Diagnosis not present

## 2020-11-22 DIAGNOSIS — D638 Anemia in other chronic diseases classified elsewhere: Secondary | ICD-10-CM | POA: Diagnosis not present

## 2020-11-22 DIAGNOSIS — R2681 Unsteadiness on feet: Secondary | ICD-10-CM | POA: Diagnosis not present

## 2020-11-22 DIAGNOSIS — D649 Anemia, unspecified: Secondary | ICD-10-CM | POA: Diagnosis not present

## 2020-11-22 DIAGNOSIS — A048 Other specified bacterial intestinal infections: Secondary | ICD-10-CM | POA: Diagnosis not present

## 2020-11-22 DIAGNOSIS — F330 Major depressive disorder, recurrent, mild: Secondary | ICD-10-CM | POA: Diagnosis not present

## 2020-11-22 DIAGNOSIS — I10 Essential (primary) hypertension: Secondary | ICD-10-CM | POA: Diagnosis not present

## 2020-11-22 DIAGNOSIS — R278 Other lack of coordination: Secondary | ICD-10-CM | POA: Diagnosis not present

## 2020-11-23 DIAGNOSIS — I10 Essential (primary) hypertension: Secondary | ICD-10-CM | POA: Diagnosis not present

## 2020-11-23 DIAGNOSIS — R296 Repeated falls: Secondary | ICD-10-CM | POA: Diagnosis not present

## 2020-11-23 DIAGNOSIS — D638 Anemia in other chronic diseases classified elsewhere: Secondary | ICD-10-CM | POA: Diagnosis not present

## 2020-11-23 DIAGNOSIS — R2681 Unsteadiness on feet: Secondary | ICD-10-CM | POA: Diagnosis not present

## 2020-11-23 DIAGNOSIS — M6281 Muscle weakness (generalized): Secondary | ICD-10-CM | POA: Diagnosis not present

## 2020-11-23 DIAGNOSIS — A048 Other specified bacterial intestinal infections: Secondary | ICD-10-CM | POA: Diagnosis not present

## 2020-11-25 DIAGNOSIS — D649 Anemia, unspecified: Secondary | ICD-10-CM | POA: Diagnosis not present

## 2020-11-25 DIAGNOSIS — I209 Angina pectoris, unspecified: Secondary | ICD-10-CM | POA: Diagnosis not present

## 2020-11-25 DIAGNOSIS — M6281 Muscle weakness (generalized): Secondary | ICD-10-CM | POA: Diagnosis not present

## 2020-11-25 DIAGNOSIS — A048 Other specified bacterial intestinal infections: Secondary | ICD-10-CM | POA: Diagnosis not present

## 2020-11-25 DIAGNOSIS — R2681 Unsteadiness on feet: Secondary | ICD-10-CM | POA: Diagnosis not present

## 2020-11-25 DIAGNOSIS — K121 Other forms of stomatitis: Secondary | ICD-10-CM | POA: Diagnosis not present

## 2020-11-25 DIAGNOSIS — F0390 Unspecified dementia without behavioral disturbance: Secondary | ICD-10-CM | POA: Diagnosis not present

## 2020-11-25 DIAGNOSIS — R296 Repeated falls: Secondary | ICD-10-CM | POA: Diagnosis not present

## 2020-11-25 DIAGNOSIS — D638 Anemia in other chronic diseases classified elsewhere: Secondary | ICD-10-CM | POA: Diagnosis not present

## 2020-11-25 DIAGNOSIS — I10 Essential (primary) hypertension: Secondary | ICD-10-CM | POA: Diagnosis not present

## 2020-11-29 DIAGNOSIS — M6281 Muscle weakness (generalized): Secondary | ICD-10-CM | POA: Diagnosis not present

## 2020-11-29 DIAGNOSIS — R296 Repeated falls: Secondary | ICD-10-CM | POA: Diagnosis not present

## 2020-11-29 DIAGNOSIS — I10 Essential (primary) hypertension: Secondary | ICD-10-CM | POA: Diagnosis not present

## 2020-11-29 DIAGNOSIS — F330 Major depressive disorder, recurrent, mild: Secondary | ICD-10-CM | POA: Diagnosis not present

## 2020-11-29 DIAGNOSIS — D638 Anemia in other chronic diseases classified elsewhere: Secondary | ICD-10-CM | POA: Diagnosis not present

## 2020-11-29 DIAGNOSIS — A048 Other specified bacterial intestinal infections: Secondary | ICD-10-CM | POA: Diagnosis not present

## 2020-11-29 DIAGNOSIS — R634 Abnormal weight loss: Secondary | ICD-10-CM | POA: Diagnosis not present

## 2020-11-29 DIAGNOSIS — R2681 Unsteadiness on feet: Secondary | ICD-10-CM | POA: Diagnosis not present

## 2020-11-29 DIAGNOSIS — F39 Unspecified mood [affective] disorder: Secondary | ICD-10-CM | POA: Diagnosis not present

## 2020-11-30 DIAGNOSIS — R2681 Unsteadiness on feet: Secondary | ICD-10-CM | POA: Diagnosis not present

## 2020-11-30 DIAGNOSIS — R296 Repeated falls: Secondary | ICD-10-CM | POA: Diagnosis not present

## 2020-11-30 DIAGNOSIS — I10 Essential (primary) hypertension: Secondary | ICD-10-CM | POA: Diagnosis not present

## 2020-11-30 DIAGNOSIS — D638 Anemia in other chronic diseases classified elsewhere: Secondary | ICD-10-CM | POA: Diagnosis not present

## 2020-11-30 DIAGNOSIS — A048 Other specified bacterial intestinal infections: Secondary | ICD-10-CM | POA: Diagnosis not present

## 2020-11-30 DIAGNOSIS — M6281 Muscle weakness (generalized): Secondary | ICD-10-CM | POA: Diagnosis not present

## 2020-12-01 DIAGNOSIS — R296 Repeated falls: Secondary | ICD-10-CM | POA: Diagnosis not present

## 2020-12-01 DIAGNOSIS — M6281 Muscle weakness (generalized): Secondary | ICD-10-CM | POA: Diagnosis not present

## 2020-12-01 DIAGNOSIS — A048 Other specified bacterial intestinal infections: Secondary | ICD-10-CM | POA: Diagnosis not present

## 2020-12-01 DIAGNOSIS — D638 Anemia in other chronic diseases classified elsewhere: Secondary | ICD-10-CM | POA: Diagnosis not present

## 2020-12-01 DIAGNOSIS — I10 Essential (primary) hypertension: Secondary | ICD-10-CM | POA: Diagnosis not present

## 2020-12-01 DIAGNOSIS — R2681 Unsteadiness on feet: Secondary | ICD-10-CM | POA: Diagnosis not present

## 2020-12-02 DIAGNOSIS — I10 Essential (primary) hypertension: Secondary | ICD-10-CM | POA: Diagnosis not present

## 2020-12-02 DIAGNOSIS — A048 Other specified bacterial intestinal infections: Secondary | ICD-10-CM | POA: Diagnosis not present

## 2020-12-02 DIAGNOSIS — R296 Repeated falls: Secondary | ICD-10-CM | POA: Diagnosis not present

## 2020-12-02 DIAGNOSIS — D638 Anemia in other chronic diseases classified elsewhere: Secondary | ICD-10-CM | POA: Diagnosis not present

## 2020-12-02 DIAGNOSIS — M6281 Muscle weakness (generalized): Secondary | ICD-10-CM | POA: Diagnosis not present

## 2020-12-02 DIAGNOSIS — R2681 Unsteadiness on feet: Secondary | ICD-10-CM | POA: Diagnosis not present

## 2020-12-03 DIAGNOSIS — R2681 Unsteadiness on feet: Secondary | ICD-10-CM | POA: Diagnosis not present

## 2020-12-03 DIAGNOSIS — M6281 Muscle weakness (generalized): Secondary | ICD-10-CM | POA: Diagnosis not present

## 2020-12-03 DIAGNOSIS — A048 Other specified bacterial intestinal infections: Secondary | ICD-10-CM | POA: Diagnosis not present

## 2020-12-03 DIAGNOSIS — I10 Essential (primary) hypertension: Secondary | ICD-10-CM | POA: Diagnosis not present

## 2020-12-03 DIAGNOSIS — R296 Repeated falls: Secondary | ICD-10-CM | POA: Diagnosis not present

## 2020-12-03 DIAGNOSIS — D638 Anemia in other chronic diseases classified elsewhere: Secondary | ICD-10-CM | POA: Diagnosis not present

## 2020-12-06 DIAGNOSIS — R234 Changes in skin texture: Secondary | ICD-10-CM | POA: Diagnosis not present

## 2020-12-15 ENCOUNTER — Non-Acute Institutional Stay: Payer: Medicare Other | Admitting: Adult Health Nurse Practitioner

## 2020-12-15 ENCOUNTER — Encounter: Payer: Self-pay | Admitting: Adult Health Nurse Practitioner

## 2020-12-15 ENCOUNTER — Other Ambulatory Visit: Payer: Self-pay

## 2020-12-15 VITALS — HR 84

## 2020-12-15 DIAGNOSIS — F0391 Unspecified dementia with behavioral disturbance: Secondary | ICD-10-CM | POA: Diagnosis not present

## 2020-12-15 DIAGNOSIS — Z515 Encounter for palliative care: Secondary | ICD-10-CM

## 2020-12-15 NOTE — Progress Notes (Signed)
[facility] Consult Note Telephone: [phone]  Fax: [phone]    Date of encounter: 12/15/20 PATIENT NAME: [name] [address] [place] [address]   [phone] (home)  DOB: [birth year] MRN: [id number] PRIMARY CARE PROVIDER:    Dr. Maryella Shivers  REFERRING PROVIDER:   Dr. Maryella Shivers  RESPONSIBLE PARTY:    Contact Information    Name Relation Home Work [place]   Easter,[name] Daughter  [phone]   Hemphill,[name] Daughter   [phone]   Thomos Lemons Relative   [phone]       I met face to face with patient in facility. Palliative Care was asked to follow this patient by consultation request of  Dr. Maryella Shivers to address advance care planning and complex medical decision making. This is a follow up visit.  Spoke with daughter via phone to update on today's visit                                   [place] / RECOMMENDATIONS:   Advance Care Planning/Goals of Care: Goals include to maximize quality of life and symptom management.  CODE STATUS: DNR  Symptom Management/Plan:  Dementia: Patient propels herself in her wheelchair and independently transfers.  She does require assistance with ADLs such as bathing and dressing.  Appetite good with no weight loss.  Patient stable at this time.  Continue supportive care at facility.   Follow up Palliative Care Visit: Palliative care will continue to follow for complex medical decision making, advance care planning, and clarification of goals. Return 8-10 weeks or prn.  I spent 35 minutes providing this consultation. More than 50% of the time in this consultation was spent in counseling and care coordination.   PPS: 40%  HOSPICE ELIGIBILITY/DIAGNOSIS: TBD  Chief Complaint: follow up palliative visit  HISTORY OF PRESENT ILLNESS:  [name] is a 87 y.o. year old female  with GERD chronic low back pain, HTN  Hypothyroidism, dementia, sundowning.  Patient has nodule to top of right hand that is reddened but no discharge noted.  The nodule came up about a week ago.  She states it only hurts if she bumps it.  Does have dermatology appointment tomorrow.  Patient states her appetite is good.  No reported weight loss per staff.  Staff and daughter have no other concerns.  Denies pain, headaches, dizziness, shortness of breath, cough, fever, N/V/D, constipation, dysuria, hematuria.  History obtained from review of EMR and interview with family, facility staff, and Ms. Diltz.     PHYSICAL EXAM:  General: NAD, frail appearing, thin Eyes: Sclera anicteric and noninjected with no discharge noted ENMT: Moist mucous membranes Cardiovascular:Irregularlyregular rate and rhythm Pulmonary:lung sounds clear; normal respiratory effort Abdomen: soft, nontender, + bowel sounds Extremities:noedema, no joint deformities Skin: no rasheson exposed skin Neurological: Weakness; alert and oriented to person and place  Thank you for the opportunity to participate in the care of [name].  The palliative care team will continue to follow. Please call our office at [phone] if we can be of additional assistance.   Rowen Wilmer Jenetta Downer, NP , DNP  This chart was dictated using voice recognition software. Despite best efforts to proofread, errors can occur which can change the documentation meaning.   COVID-19 PATIENT SCREENING TOOL Asked and negative response unless otherwise noted:   Have you had symptoms of covid, tested positive or  been in contact with someone with symptoms/positive test in the past 5-10 days? negative

## 2020-12-16 DIAGNOSIS — Z85828 Personal history of other malignant neoplasm of skin: Secondary | ICD-10-CM | POA: Diagnosis not present

## 2020-12-16 DIAGNOSIS — C44622 Squamous cell carcinoma of skin of right upper limb, including shoulder: Secondary | ICD-10-CM | POA: Diagnosis not present

## 2020-12-17 DIAGNOSIS — R234 Changes in skin texture: Secondary | ICD-10-CM | POA: Diagnosis not present

## 2020-12-20 DIAGNOSIS — L89601 Pressure ulcer of unspecified heel, stage 1: Secondary | ICD-10-CM | POA: Diagnosis not present

## 2020-12-20 DIAGNOSIS — F39 Unspecified mood [affective] disorder: Secondary | ICD-10-CM | POA: Diagnosis not present

## 2020-12-20 DIAGNOSIS — F330 Major depressive disorder, recurrent, mild: Secondary | ICD-10-CM | POA: Diagnosis not present

## 2020-12-22 DIAGNOSIS — T8149XA Infection following a procedure, other surgical site, initial encounter: Secondary | ICD-10-CM | POA: Diagnosis not present

## 2020-12-27 DIAGNOSIS — F413 Other mixed anxiety disorders: Secondary | ICD-10-CM | POA: Diagnosis not present

## 2020-12-27 DIAGNOSIS — F330 Major depressive disorder, recurrent, mild: Secondary | ICD-10-CM | POA: Diagnosis not present

## 2020-12-29 DIAGNOSIS — T8149XA Infection following a procedure, other surgical site, initial encounter: Secondary | ICD-10-CM | POA: Diagnosis not present

## 2020-12-29 DIAGNOSIS — I209 Angina pectoris, unspecified: Secondary | ICD-10-CM | POA: Diagnosis not present

## 2020-12-29 DIAGNOSIS — F0390 Unspecified dementia without behavioral disturbance: Secondary | ICD-10-CM | POA: Diagnosis not present

## 2020-12-29 DIAGNOSIS — E039 Hypothyroidism, unspecified: Secondary | ICD-10-CM | POA: Diagnosis not present

## 2021-01-03 DIAGNOSIS — F413 Other mixed anxiety disorders: Secondary | ICD-10-CM | POA: Diagnosis not present

## 2021-01-03 DIAGNOSIS — F330 Major depressive disorder, recurrent, mild: Secondary | ICD-10-CM | POA: Diagnosis not present

## 2021-01-05 DIAGNOSIS — R41841 Cognitive communication deficit: Secondary | ICD-10-CM | POA: Diagnosis not present

## 2021-01-05 DIAGNOSIS — M6281 Muscle weakness (generalized): Secondary | ICD-10-CM | POA: Diagnosis not present

## 2021-01-05 DIAGNOSIS — D649 Anemia, unspecified: Secondary | ICD-10-CM | POA: Diagnosis not present

## 2021-01-05 DIAGNOSIS — I10 Essential (primary) hypertension: Secondary | ICD-10-CM | POA: Diagnosis not present

## 2021-01-05 DIAGNOSIS — A048 Other specified bacterial intestinal infections: Secondary | ICD-10-CM | POA: Diagnosis not present

## 2021-01-05 DIAGNOSIS — D638 Anemia in other chronic diseases classified elsewhere: Secondary | ICD-10-CM | POA: Diagnosis not present

## 2021-01-05 DIAGNOSIS — R278 Other lack of coordination: Secondary | ICD-10-CM | POA: Diagnosis not present

## 2021-01-05 DIAGNOSIS — R2681 Unsteadiness on feet: Secondary | ICD-10-CM | POA: Diagnosis not present

## 2021-01-11 DIAGNOSIS — R234 Changes in skin texture: Secondary | ICD-10-CM | POA: Diagnosis not present

## 2021-01-11 DIAGNOSIS — T8149XA Infection following a procedure, other surgical site, initial encounter: Secondary | ICD-10-CM | POA: Diagnosis not present

## 2021-01-19 DIAGNOSIS — K219 Gastro-esophageal reflux disease without esophagitis: Secondary | ICD-10-CM | POA: Diagnosis not present

## 2021-01-19 DIAGNOSIS — I739 Peripheral vascular disease, unspecified: Secondary | ICD-10-CM | POA: Diagnosis not present

## 2021-01-19 DIAGNOSIS — I2510 Atherosclerotic heart disease of native coronary artery without angina pectoris: Secondary | ICD-10-CM | POA: Diagnosis not present

## 2021-01-19 DIAGNOSIS — H409 Unspecified glaucoma: Secondary | ICD-10-CM | POA: Diagnosis not present

## 2021-01-19 DIAGNOSIS — M6281 Muscle weakness (generalized): Secondary | ICD-10-CM | POA: Diagnosis not present

## 2021-01-19 DIAGNOSIS — K523 Indeterminate colitis: Secondary | ICD-10-CM | POA: Diagnosis not present

## 2021-01-19 DIAGNOSIS — E782 Mixed hyperlipidemia: Secondary | ICD-10-CM | POA: Diagnosis not present

## 2021-01-19 DIAGNOSIS — K648 Other hemorrhoids: Secondary | ICD-10-CM | POA: Diagnosis not present

## 2021-01-19 DIAGNOSIS — J302 Other seasonal allergic rhinitis: Secondary | ICD-10-CM | POA: Diagnosis not present

## 2021-01-19 DIAGNOSIS — E038 Other specified hypothyroidism: Secondary | ICD-10-CM | POA: Diagnosis not present

## 2021-01-19 DIAGNOSIS — I10 Essential (primary) hypertension: Secondary | ICD-10-CM | POA: Diagnosis not present

## 2021-01-19 DIAGNOSIS — N1832 Chronic kidney disease, stage 3b: Secondary | ICD-10-CM | POA: Diagnosis not present

## 2021-01-24 DIAGNOSIS — F330 Major depressive disorder, recurrent, mild: Secondary | ICD-10-CM | POA: Diagnosis not present

## 2021-01-24 DIAGNOSIS — F413 Other mixed anxiety disorders: Secondary | ICD-10-CM | POA: Diagnosis not present

## 2021-01-28 DIAGNOSIS — L820 Inflamed seborrheic keratosis: Secondary | ICD-10-CM | POA: Diagnosis not present

## 2021-01-28 DIAGNOSIS — D485 Neoplasm of uncertain behavior of skin: Secondary | ICD-10-CM | POA: Diagnosis not present

## 2021-01-28 DIAGNOSIS — E782 Mixed hyperlipidemia: Secondary | ICD-10-CM | POA: Diagnosis not present

## 2021-01-28 DIAGNOSIS — J302 Other seasonal allergic rhinitis: Secondary | ICD-10-CM | POA: Diagnosis not present

## 2021-01-28 DIAGNOSIS — N1832 Chronic kidney disease, stage 3b: Secondary | ICD-10-CM | POA: Diagnosis not present

## 2021-01-28 DIAGNOSIS — I10 Essential (primary) hypertension: Secondary | ICD-10-CM | POA: Diagnosis not present

## 2021-01-28 DIAGNOSIS — L821 Other seborrheic keratosis: Secondary | ICD-10-CM | POA: Diagnosis not present

## 2021-01-28 DIAGNOSIS — I739 Peripheral vascular disease, unspecified: Secondary | ICD-10-CM | POA: Diagnosis not present

## 2021-01-28 DIAGNOSIS — Z85828 Personal history of other malignant neoplasm of skin: Secondary | ICD-10-CM | POA: Diagnosis not present

## 2021-01-28 DIAGNOSIS — E038 Other specified hypothyroidism: Secondary | ICD-10-CM | POA: Diagnosis not present

## 2021-01-28 DIAGNOSIS — M6281 Muscle weakness (generalized): Secondary | ICD-10-CM | POA: Diagnosis not present

## 2021-02-05 DIAGNOSIS — D649 Anemia, unspecified: Secondary | ICD-10-CM | POA: Diagnosis not present

## 2021-02-05 DIAGNOSIS — I10 Essential (primary) hypertension: Secondary | ICD-10-CM | POA: Diagnosis not present

## 2021-02-07 DIAGNOSIS — F330 Major depressive disorder, recurrent, mild: Secondary | ICD-10-CM | POA: Diagnosis not present

## 2021-02-07 DIAGNOSIS — F413 Other mixed anxiety disorders: Secondary | ICD-10-CM | POA: Diagnosis not present

## 2021-02-17 DIAGNOSIS — A048 Other specified bacterial intestinal infections: Secondary | ICD-10-CM | POA: Diagnosis not present

## 2021-02-17 DIAGNOSIS — D638 Anemia in other chronic diseases classified elsewhere: Secondary | ICD-10-CM | POA: Diagnosis not present

## 2021-02-17 DIAGNOSIS — R41841 Cognitive communication deficit: Secondary | ICD-10-CM | POA: Diagnosis not present

## 2021-02-17 DIAGNOSIS — D649 Anemia, unspecified: Secondary | ICD-10-CM | POA: Diagnosis not present

## 2021-02-17 DIAGNOSIS — R4189 Other symptoms and signs involving cognitive functions and awareness: Secondary | ICD-10-CM | POA: Diagnosis not present

## 2021-02-17 DIAGNOSIS — R278 Other lack of coordination: Secondary | ICD-10-CM | POA: Diagnosis not present

## 2021-02-17 DIAGNOSIS — M6281 Muscle weakness (generalized): Secondary | ICD-10-CM | POA: Diagnosis not present

## 2021-02-17 DIAGNOSIS — I10 Essential (primary) hypertension: Secondary | ICD-10-CM | POA: Diagnosis not present

## 2021-02-17 DIAGNOSIS — R2681 Unsteadiness on feet: Secondary | ICD-10-CM | POA: Diagnosis not present

## 2021-02-17 DIAGNOSIS — R296 Repeated falls: Secondary | ICD-10-CM | POA: Diagnosis not present

## 2021-02-20 DIAGNOSIS — D638 Anemia in other chronic diseases classified elsewhere: Secondary | ICD-10-CM | POA: Diagnosis not present

## 2021-02-20 DIAGNOSIS — R41841 Cognitive communication deficit: Secondary | ICD-10-CM | POA: Diagnosis not present

## 2021-02-20 DIAGNOSIS — A048 Other specified bacterial intestinal infections: Secondary | ICD-10-CM | POA: Diagnosis not present

## 2021-02-20 DIAGNOSIS — D649 Anemia, unspecified: Secondary | ICD-10-CM | POA: Diagnosis not present

## 2021-02-20 DIAGNOSIS — I10 Essential (primary) hypertension: Secondary | ICD-10-CM | POA: Diagnosis not present

## 2021-02-20 DIAGNOSIS — R296 Repeated falls: Secondary | ICD-10-CM | POA: Diagnosis not present

## 2021-02-20 DIAGNOSIS — R4189 Other symptoms and signs involving cognitive functions and awareness: Secondary | ICD-10-CM | POA: Diagnosis not present

## 2021-02-20 DIAGNOSIS — R2681 Unsteadiness on feet: Secondary | ICD-10-CM | POA: Diagnosis not present

## 2021-02-20 DIAGNOSIS — R278 Other lack of coordination: Secondary | ICD-10-CM | POA: Diagnosis not present

## 2021-02-20 DIAGNOSIS — M6281 Muscle weakness (generalized): Secondary | ICD-10-CM | POA: Diagnosis not present

## 2021-02-21 DIAGNOSIS — R2681 Unsteadiness on feet: Secondary | ICD-10-CM | POA: Diagnosis not present

## 2021-02-21 DIAGNOSIS — R296 Repeated falls: Secondary | ICD-10-CM | POA: Diagnosis not present

## 2021-02-21 DIAGNOSIS — R41841 Cognitive communication deficit: Secondary | ICD-10-CM | POA: Diagnosis not present

## 2021-02-21 DIAGNOSIS — A048 Other specified bacterial intestinal infections: Secondary | ICD-10-CM | POA: Diagnosis not present

## 2021-02-21 DIAGNOSIS — M6281 Muscle weakness (generalized): Secondary | ICD-10-CM | POA: Diagnosis not present

## 2021-02-21 DIAGNOSIS — I10 Essential (primary) hypertension: Secondary | ICD-10-CM | POA: Diagnosis not present

## 2021-02-22 DIAGNOSIS — R2681 Unsteadiness on feet: Secondary | ICD-10-CM | POA: Diagnosis not present

## 2021-02-22 DIAGNOSIS — M6281 Muscle weakness (generalized): Secondary | ICD-10-CM | POA: Diagnosis not present

## 2021-02-22 DIAGNOSIS — A048 Other specified bacterial intestinal infections: Secondary | ICD-10-CM | POA: Diagnosis not present

## 2021-02-22 DIAGNOSIS — R296 Repeated falls: Secondary | ICD-10-CM | POA: Diagnosis not present

## 2021-02-22 DIAGNOSIS — I10 Essential (primary) hypertension: Secondary | ICD-10-CM | POA: Diagnosis not present

## 2021-02-22 DIAGNOSIS — R41841 Cognitive communication deficit: Secondary | ICD-10-CM | POA: Diagnosis not present

## 2021-02-23 DIAGNOSIS — R41841 Cognitive communication deficit: Secondary | ICD-10-CM | POA: Diagnosis not present

## 2021-02-23 DIAGNOSIS — R2681 Unsteadiness on feet: Secondary | ICD-10-CM | POA: Diagnosis not present

## 2021-02-23 DIAGNOSIS — A048 Other specified bacterial intestinal infections: Secondary | ICD-10-CM | POA: Diagnosis not present

## 2021-02-23 DIAGNOSIS — R296 Repeated falls: Secondary | ICD-10-CM | POA: Diagnosis not present

## 2021-02-23 DIAGNOSIS — I10 Essential (primary) hypertension: Secondary | ICD-10-CM | POA: Diagnosis not present

## 2021-02-23 DIAGNOSIS — M6281 Muscle weakness (generalized): Secondary | ICD-10-CM | POA: Diagnosis not present

## 2021-02-24 DIAGNOSIS — R2681 Unsteadiness on feet: Secondary | ICD-10-CM | POA: Diagnosis not present

## 2021-02-24 DIAGNOSIS — R41841 Cognitive communication deficit: Secondary | ICD-10-CM | POA: Diagnosis not present

## 2021-02-24 DIAGNOSIS — R296 Repeated falls: Secondary | ICD-10-CM | POA: Diagnosis not present

## 2021-02-24 DIAGNOSIS — M6281 Muscle weakness (generalized): Secondary | ICD-10-CM | POA: Diagnosis not present

## 2021-02-24 DIAGNOSIS — I10 Essential (primary) hypertension: Secondary | ICD-10-CM | POA: Diagnosis not present

## 2021-02-24 DIAGNOSIS — A048 Other specified bacterial intestinal infections: Secondary | ICD-10-CM | POA: Diagnosis not present

## 2021-02-25 DIAGNOSIS — N1832 Chronic kidney disease, stage 3b: Secondary | ICD-10-CM | POA: Diagnosis not present

## 2021-02-25 DIAGNOSIS — K523 Indeterminate colitis: Secondary | ICD-10-CM | POA: Diagnosis not present

## 2021-02-25 DIAGNOSIS — J302 Other seasonal allergic rhinitis: Secondary | ICD-10-CM | POA: Diagnosis not present

## 2021-02-25 DIAGNOSIS — E038 Other specified hypothyroidism: Secondary | ICD-10-CM | POA: Diagnosis not present

## 2021-02-25 DIAGNOSIS — H409 Unspecified glaucoma: Secondary | ICD-10-CM | POA: Diagnosis not present

## 2021-02-25 DIAGNOSIS — K648 Other hemorrhoids: Secondary | ICD-10-CM | POA: Diagnosis not present

## 2021-02-25 DIAGNOSIS — M6281 Muscle weakness (generalized): Secondary | ICD-10-CM | POA: Diagnosis not present

## 2021-02-25 DIAGNOSIS — I10 Essential (primary) hypertension: Secondary | ICD-10-CM | POA: Diagnosis not present

## 2021-02-25 DIAGNOSIS — I739 Peripheral vascular disease, unspecified: Secondary | ICD-10-CM | POA: Diagnosis not present

## 2021-02-25 DIAGNOSIS — I2510 Atherosclerotic heart disease of native coronary artery without angina pectoris: Secondary | ICD-10-CM | POA: Diagnosis not present

## 2021-02-25 DIAGNOSIS — E782 Mixed hyperlipidemia: Secondary | ICD-10-CM | POA: Diagnosis not present

## 2021-02-25 DIAGNOSIS — K219 Gastro-esophageal reflux disease without esophagitis: Secondary | ICD-10-CM | POA: Diagnosis not present

## 2021-02-27 DIAGNOSIS — R41841 Cognitive communication deficit: Secondary | ICD-10-CM | POA: Diagnosis not present

## 2021-02-27 DIAGNOSIS — M6281 Muscle weakness (generalized): Secondary | ICD-10-CM | POA: Diagnosis not present

## 2021-02-27 DIAGNOSIS — R296 Repeated falls: Secondary | ICD-10-CM | POA: Diagnosis not present

## 2021-02-27 DIAGNOSIS — A048 Other specified bacterial intestinal infections: Secondary | ICD-10-CM | POA: Diagnosis not present

## 2021-02-27 DIAGNOSIS — R2681 Unsteadiness on feet: Secondary | ICD-10-CM | POA: Diagnosis not present

## 2021-02-27 DIAGNOSIS — I10 Essential (primary) hypertension: Secondary | ICD-10-CM | POA: Diagnosis not present

## 2021-02-28 DIAGNOSIS — R41841 Cognitive communication deficit: Secondary | ICD-10-CM | POA: Diagnosis not present

## 2021-02-28 DIAGNOSIS — R2681 Unsteadiness on feet: Secondary | ICD-10-CM | POA: Diagnosis not present

## 2021-02-28 DIAGNOSIS — M6281 Muscle weakness (generalized): Secondary | ICD-10-CM | POA: Diagnosis not present

## 2021-02-28 DIAGNOSIS — A048 Other specified bacterial intestinal infections: Secondary | ICD-10-CM | POA: Diagnosis not present

## 2021-02-28 DIAGNOSIS — R296 Repeated falls: Secondary | ICD-10-CM | POA: Diagnosis not present

## 2021-02-28 DIAGNOSIS — I10 Essential (primary) hypertension: Secondary | ICD-10-CM | POA: Diagnosis not present

## 2021-03-01 DIAGNOSIS — R296 Repeated falls: Secondary | ICD-10-CM | POA: Diagnosis not present

## 2021-03-01 DIAGNOSIS — R41841 Cognitive communication deficit: Secondary | ICD-10-CM | POA: Diagnosis not present

## 2021-03-01 DIAGNOSIS — A048 Other specified bacterial intestinal infections: Secondary | ICD-10-CM | POA: Diagnosis not present

## 2021-03-01 DIAGNOSIS — R2681 Unsteadiness on feet: Secondary | ICD-10-CM | POA: Diagnosis not present

## 2021-03-01 DIAGNOSIS — I10 Essential (primary) hypertension: Secondary | ICD-10-CM | POA: Diagnosis not present

## 2021-03-01 DIAGNOSIS — M6281 Muscle weakness (generalized): Secondary | ICD-10-CM | POA: Diagnosis not present

## 2021-03-02 DIAGNOSIS — A048 Other specified bacterial intestinal infections: Secondary | ICD-10-CM | POA: Diagnosis not present

## 2021-03-02 DIAGNOSIS — M6281 Muscle weakness (generalized): Secondary | ICD-10-CM | POA: Diagnosis not present

## 2021-03-02 DIAGNOSIS — R2681 Unsteadiness on feet: Secondary | ICD-10-CM | POA: Diagnosis not present

## 2021-03-02 DIAGNOSIS — R41841 Cognitive communication deficit: Secondary | ICD-10-CM | POA: Diagnosis not present

## 2021-03-02 DIAGNOSIS — R296 Repeated falls: Secondary | ICD-10-CM | POA: Diagnosis not present

## 2021-03-02 DIAGNOSIS — I10 Essential (primary) hypertension: Secondary | ICD-10-CM | POA: Diagnosis not present

## 2021-03-03 DIAGNOSIS — A048 Other specified bacterial intestinal infections: Secondary | ICD-10-CM | POA: Diagnosis not present

## 2021-03-03 DIAGNOSIS — R296 Repeated falls: Secondary | ICD-10-CM | POA: Diagnosis not present

## 2021-03-03 DIAGNOSIS — M6281 Muscle weakness (generalized): Secondary | ICD-10-CM | POA: Diagnosis not present

## 2021-03-03 DIAGNOSIS — R41841 Cognitive communication deficit: Secondary | ICD-10-CM | POA: Diagnosis not present

## 2021-03-03 DIAGNOSIS — I10 Essential (primary) hypertension: Secondary | ICD-10-CM | POA: Diagnosis not present

## 2021-03-03 DIAGNOSIS — R2681 Unsteadiness on feet: Secondary | ICD-10-CM | POA: Diagnosis not present

## 2021-03-06 DIAGNOSIS — R2681 Unsteadiness on feet: Secondary | ICD-10-CM | POA: Diagnosis not present

## 2021-03-06 DIAGNOSIS — R296 Repeated falls: Secondary | ICD-10-CM | POA: Diagnosis not present

## 2021-03-06 DIAGNOSIS — R41841 Cognitive communication deficit: Secondary | ICD-10-CM | POA: Diagnosis not present

## 2021-03-06 DIAGNOSIS — M6281 Muscle weakness (generalized): Secondary | ICD-10-CM | POA: Diagnosis not present

## 2021-03-06 DIAGNOSIS — A048 Other specified bacterial intestinal infections: Secondary | ICD-10-CM | POA: Diagnosis not present

## 2021-03-06 DIAGNOSIS — I10 Essential (primary) hypertension: Secondary | ICD-10-CM | POA: Diagnosis not present

## 2021-03-07 DIAGNOSIS — I10 Essential (primary) hypertension: Secondary | ICD-10-CM | POA: Diagnosis not present

## 2021-03-07 DIAGNOSIS — A048 Other specified bacterial intestinal infections: Secondary | ICD-10-CM | POA: Diagnosis not present

## 2021-03-07 DIAGNOSIS — R41841 Cognitive communication deficit: Secondary | ICD-10-CM | POA: Diagnosis not present

## 2021-03-07 DIAGNOSIS — M6281 Muscle weakness (generalized): Secondary | ICD-10-CM | POA: Diagnosis not present

## 2021-03-07 DIAGNOSIS — R296 Repeated falls: Secondary | ICD-10-CM | POA: Diagnosis not present

## 2021-03-07 DIAGNOSIS — R2681 Unsteadiness on feet: Secondary | ICD-10-CM | POA: Diagnosis not present

## 2021-03-08 DIAGNOSIS — R2681 Unsteadiness on feet: Secondary | ICD-10-CM | POA: Diagnosis not present

## 2021-03-08 DIAGNOSIS — R296 Repeated falls: Secondary | ICD-10-CM | POA: Diagnosis not present

## 2021-03-08 DIAGNOSIS — R41841 Cognitive communication deficit: Secondary | ICD-10-CM | POA: Diagnosis not present

## 2021-03-08 DIAGNOSIS — M6281 Muscle weakness (generalized): Secondary | ICD-10-CM | POA: Diagnosis not present

## 2021-03-08 DIAGNOSIS — A048 Other specified bacterial intestinal infections: Secondary | ICD-10-CM | POA: Diagnosis not present

## 2021-03-08 DIAGNOSIS — I10 Essential (primary) hypertension: Secondary | ICD-10-CM | POA: Diagnosis not present

## 2021-03-09 DIAGNOSIS — M6281 Muscle weakness (generalized): Secondary | ICD-10-CM | POA: Diagnosis not present

## 2021-03-09 DIAGNOSIS — R2681 Unsteadiness on feet: Secondary | ICD-10-CM | POA: Diagnosis not present

## 2021-03-09 DIAGNOSIS — R296 Repeated falls: Secondary | ICD-10-CM | POA: Diagnosis not present

## 2021-03-09 DIAGNOSIS — I10 Essential (primary) hypertension: Secondary | ICD-10-CM | POA: Diagnosis not present

## 2021-03-09 DIAGNOSIS — A048 Other specified bacterial intestinal infections: Secondary | ICD-10-CM | POA: Diagnosis not present

## 2021-03-09 DIAGNOSIS — R41841 Cognitive communication deficit: Secondary | ICD-10-CM | POA: Diagnosis not present

## 2021-03-10 DIAGNOSIS — A048 Other specified bacterial intestinal infections: Secondary | ICD-10-CM | POA: Diagnosis not present

## 2021-03-10 DIAGNOSIS — M6281 Muscle weakness (generalized): Secondary | ICD-10-CM | POA: Diagnosis not present

## 2021-03-10 DIAGNOSIS — R2681 Unsteadiness on feet: Secondary | ICD-10-CM | POA: Diagnosis not present

## 2021-03-10 DIAGNOSIS — R41841 Cognitive communication deficit: Secondary | ICD-10-CM | POA: Diagnosis not present

## 2021-03-10 DIAGNOSIS — I10 Essential (primary) hypertension: Secondary | ICD-10-CM | POA: Diagnosis not present

## 2021-03-10 DIAGNOSIS — R296 Repeated falls: Secondary | ICD-10-CM | POA: Diagnosis not present

## 2021-03-10 DIAGNOSIS — E039 Hypothyroidism, unspecified: Secondary | ICD-10-CM | POA: Diagnosis not present

## 2021-03-13 DIAGNOSIS — M6281 Muscle weakness (generalized): Secondary | ICD-10-CM | POA: Diagnosis not present

## 2021-03-13 DIAGNOSIS — I10 Essential (primary) hypertension: Secondary | ICD-10-CM | POA: Diagnosis not present

## 2021-03-13 DIAGNOSIS — R2681 Unsteadiness on feet: Secondary | ICD-10-CM | POA: Diagnosis not present

## 2021-03-13 DIAGNOSIS — A048 Other specified bacterial intestinal infections: Secondary | ICD-10-CM | POA: Diagnosis not present

## 2021-03-13 DIAGNOSIS — R296 Repeated falls: Secondary | ICD-10-CM | POA: Diagnosis not present

## 2021-03-13 DIAGNOSIS — R41841 Cognitive communication deficit: Secondary | ICD-10-CM | POA: Diagnosis not present

## 2021-03-14 DIAGNOSIS — R41841 Cognitive communication deficit: Secondary | ICD-10-CM | POA: Diagnosis not present

## 2021-03-14 DIAGNOSIS — R2681 Unsteadiness on feet: Secondary | ICD-10-CM | POA: Diagnosis not present

## 2021-03-14 DIAGNOSIS — M6281 Muscle weakness (generalized): Secondary | ICD-10-CM | POA: Diagnosis not present

## 2021-03-14 DIAGNOSIS — A048 Other specified bacterial intestinal infections: Secondary | ICD-10-CM | POA: Diagnosis not present

## 2021-03-14 DIAGNOSIS — R296 Repeated falls: Secondary | ICD-10-CM | POA: Diagnosis not present

## 2021-03-14 DIAGNOSIS — I10 Essential (primary) hypertension: Secondary | ICD-10-CM | POA: Diagnosis not present

## 2021-03-17 DIAGNOSIS — A048 Other specified bacterial intestinal infections: Secondary | ICD-10-CM | POA: Diagnosis not present

## 2021-03-17 DIAGNOSIS — R2681 Unsteadiness on feet: Secondary | ICD-10-CM | POA: Diagnosis not present

## 2021-03-17 DIAGNOSIS — M6281 Muscle weakness (generalized): Secondary | ICD-10-CM | POA: Diagnosis not present

## 2021-03-17 DIAGNOSIS — R296 Repeated falls: Secondary | ICD-10-CM | POA: Diagnosis not present

## 2021-03-17 DIAGNOSIS — I10 Essential (primary) hypertension: Secondary | ICD-10-CM | POA: Diagnosis not present

## 2021-03-17 DIAGNOSIS — R41841 Cognitive communication deficit: Secondary | ICD-10-CM | POA: Diagnosis not present

## 2021-03-18 ENCOUNTER — Other Ambulatory Visit: Payer: Self-pay

## 2021-03-18 ENCOUNTER — Non-Acute Institutional Stay: Payer: Medicare Other | Admitting: Student

## 2021-03-18 DIAGNOSIS — F0391 Unspecified dementia with behavioral disturbance: Secondary | ICD-10-CM | POA: Diagnosis not present

## 2021-03-18 DIAGNOSIS — Z515 Encounter for palliative care: Secondary | ICD-10-CM

## 2021-03-20 DIAGNOSIS — M6281 Muscle weakness (generalized): Secondary | ICD-10-CM | POA: Diagnosis not present

## 2021-03-20 DIAGNOSIS — R2681 Unsteadiness on feet: Secondary | ICD-10-CM | POA: Diagnosis not present

## 2021-03-20 DIAGNOSIS — R41841 Cognitive communication deficit: Secondary | ICD-10-CM | POA: Diagnosis not present

## 2021-03-20 DIAGNOSIS — I10 Essential (primary) hypertension: Secondary | ICD-10-CM | POA: Diagnosis not present

## 2021-03-20 DIAGNOSIS — R296 Repeated falls: Secondary | ICD-10-CM | POA: Diagnosis not present

## 2021-03-20 DIAGNOSIS — A048 Other specified bacterial intestinal infections: Secondary | ICD-10-CM | POA: Diagnosis not present

## 2021-03-21 DIAGNOSIS — R278 Other lack of coordination: Secondary | ICD-10-CM | POA: Diagnosis not present

## 2021-03-21 DIAGNOSIS — F413 Other mixed anxiety disorders: Secondary | ICD-10-CM | POA: Diagnosis not present

## 2021-03-21 DIAGNOSIS — F39 Unspecified mood [affective] disorder: Secondary | ICD-10-CM | POA: Diagnosis not present

## 2021-03-21 DIAGNOSIS — A048 Other specified bacterial intestinal infections: Secondary | ICD-10-CM | POA: Diagnosis not present

## 2021-03-21 DIAGNOSIS — R296 Repeated falls: Secondary | ICD-10-CM | POA: Diagnosis not present

## 2021-03-21 DIAGNOSIS — R41841 Cognitive communication deficit: Secondary | ICD-10-CM | POA: Diagnosis not present

## 2021-03-21 DIAGNOSIS — D638 Anemia in other chronic diseases classified elsewhere: Secondary | ICD-10-CM | POA: Diagnosis not present

## 2021-03-21 DIAGNOSIS — D649 Anemia, unspecified: Secondary | ICD-10-CM | POA: Diagnosis not present

## 2021-03-21 DIAGNOSIS — M6281 Muscle weakness (generalized): Secondary | ICD-10-CM | POA: Diagnosis not present

## 2021-03-21 DIAGNOSIS — R4189 Other symptoms and signs involving cognitive functions and awareness: Secondary | ICD-10-CM | POA: Diagnosis not present

## 2021-03-21 DIAGNOSIS — F330 Major depressive disorder, recurrent, mild: Secondary | ICD-10-CM | POA: Diagnosis not present

## 2021-03-21 DIAGNOSIS — R2681 Unsteadiness on feet: Secondary | ICD-10-CM | POA: Diagnosis not present

## 2021-03-21 DIAGNOSIS — I10 Essential (primary) hypertension: Secondary | ICD-10-CM | POA: Diagnosis not present

## 2021-03-21 NOTE — Progress Notes (Signed)
Therapist, nutritional Palliative Care Consult Note Telephone: [phone]  Fax: [phone]    Date of encounter: 03/18/2021 PATIENT NAME: [name] [address] [place] [address]   [phone] (home)  DOB: 12-14-1920 MRN: [id number] PRIMARY CARE PROVIDER:    Dr. Charlott Rakes  REFERRING PROVIDER:   Dr. Charlott Rakes  RESPONSIBLE PARTY:    Contact Information     Name Relation Home Work Mobile   Easter,[name] Daughter  [phone]   Hemphill,[name] Daughter   [phone]   Sheppard Coil Relative   [phone]        I met face to face with patient in the facility. Palliative Care was asked to follow this patient by consultation request of  Dr. Yetta Flock to address advance care planning and complex medical decision making. This is a follow up visit.                                   ASSESSMENT AND PLAN / RECOMMENDATIONS:   Advance Care Planning/Goals of Care: Goals include to maximize quality of life and symptom management.   CODE STATUS: DNR  Symptom Management/Plan:  Dementia-patient requires assistance with adl's. Staff to reorient/redirect as needed. Monitor for falls, safety. Continue Depakote as directed. Appetite is good; no weight loss reported. Will monitor for functional and cognitive declines.    Follow up Palliative Care Visit: Palliative care will continue to follow for complex medical decision making, advance care planning, and clarification of goals. Return in 8 weeks or prn.  I spent 25 minutes providing this consultation. More than 50% of the time in this consultation was spent in counseling and care coordination.   PPS: 40%  HOSPICE ELIGIBILITY/DIAGNOSIS: TBD  Chief Complaint: Palliative Medicine follow up visit.   HISTORY OF PRESENT ILLNESS:  [name] is a 87 y.o. year old female  with dementia, GERD, chronic lower back pain, hypertension, hypothyroidism.  Patient resides at [facility]. She reports doing well. She denies having pain, discomfort, shortness of breath. No nausea or constipation. She endorses a good appetite, states she eats small meals. She is sleeping well at night. No recent falls or injury. No recent infections reported. No recent ER visits or hospitalizations.   History obtained from review of EMR, discussion with primary team, and interview with family, facility staff/caregiver and/or Ms. [name].  I reviewed available labs, medications, imaging, studies and related documents from the EMR.  Records reviewed and summarized above.   ROS  General: NAD EYES: denies vision changes ENMT: denies dysphagia Cardiovascular: denies chest pain Pulmonary: denies cough, denies SOB Abdomen: endorses good appetite, denies constipation GU: denies dysuria MSK: weakness Skin: denies rashes or wounds Neurological: denies pain, denies insomnia Psych: Endorses positive mood Heme/lymph/immuno: denies bruises, abnormal bleeding  Physical Exam: Weight: 104.4 pounds 02/2021 Constitutional: NAD General: frail appearing, thin  EYES: anicteric sclera, lids intact, no discharge  ENMT: intact hearing, oral mucous membranes moist, dentition intact CV: irregular rate, rhythm, no LE edema Pulmonary: LCTA, no increased work of breathing, no cough, room air Abdomen: normo-active BS + 4 quadrants, soft and non tender, no ascites GU: deferred MSK: moves all extremities, non-ambulatory, pedals self in w/c Skin: warm and dry, no rashes or wounds on visible skin Neuro: generalized weakness, A & O x 2, forgetful Psych: non-anxious affect, pleasant mood/affect Hem/lymph/immuno: no widespread bruising   Thank you for the opportunity  to participate in the care of [name].  The palliative care team will continue to follow. Please call our office at [phone] if we can be of additional assistance.   Ezekiel Slocumb, NP   COVID-19 PATIENT SCREENING TOOL Asked  and negative response unless otherwise noted:   Have you had symptoms of covid, tested positive or been in contact with someone with symptoms/positive test in the past 5-10 days? No

## 2021-03-22 DIAGNOSIS — I10 Essential (primary) hypertension: Secondary | ICD-10-CM | POA: Diagnosis not present

## 2021-03-22 DIAGNOSIS — M6281 Muscle weakness (generalized): Secondary | ICD-10-CM | POA: Diagnosis not present

## 2021-03-22 DIAGNOSIS — E038 Other specified hypothyroidism: Secondary | ICD-10-CM | POA: Diagnosis not present

## 2021-03-22 DIAGNOSIS — E782 Mixed hyperlipidemia: Secondary | ICD-10-CM | POA: Diagnosis not present

## 2021-03-22 DIAGNOSIS — N1832 Chronic kidney disease, stage 3b: Secondary | ICD-10-CM | POA: Diagnosis not present

## 2021-03-22 DIAGNOSIS — K219 Gastro-esophageal reflux disease without esophagitis: Secondary | ICD-10-CM | POA: Diagnosis not present

## 2021-03-22 DIAGNOSIS — R2681 Unsteadiness on feet: Secondary | ICD-10-CM | POA: Diagnosis not present

## 2021-03-22 DIAGNOSIS — A048 Other specified bacterial intestinal infections: Secondary | ICD-10-CM | POA: Diagnosis not present

## 2021-03-22 DIAGNOSIS — R41841 Cognitive communication deficit: Secondary | ICD-10-CM | POA: Diagnosis not present

## 2021-03-22 DIAGNOSIS — R296 Repeated falls: Secondary | ICD-10-CM | POA: Diagnosis not present

## 2021-03-22 DIAGNOSIS — J302 Other seasonal allergic rhinitis: Secondary | ICD-10-CM | POA: Diagnosis not present

## 2021-03-22 DIAGNOSIS — I739 Peripheral vascular disease, unspecified: Secondary | ICD-10-CM | POA: Diagnosis not present

## 2021-03-24 DIAGNOSIS — A048 Other specified bacterial intestinal infections: Secondary | ICD-10-CM | POA: Diagnosis not present

## 2021-03-24 DIAGNOSIS — R296 Repeated falls: Secondary | ICD-10-CM | POA: Diagnosis not present

## 2021-03-24 DIAGNOSIS — R41841 Cognitive communication deficit: Secondary | ICD-10-CM | POA: Diagnosis not present

## 2021-03-24 DIAGNOSIS — R2681 Unsteadiness on feet: Secondary | ICD-10-CM | POA: Diagnosis not present

## 2021-03-24 DIAGNOSIS — M6281 Muscle weakness (generalized): Secondary | ICD-10-CM | POA: Diagnosis not present

## 2021-03-24 DIAGNOSIS — I10 Essential (primary) hypertension: Secondary | ICD-10-CM | POA: Diagnosis not present

## 2021-03-29 DIAGNOSIS — A048 Other specified bacterial intestinal infections: Secondary | ICD-10-CM | POA: Diagnosis not present

## 2021-03-29 DIAGNOSIS — R41841 Cognitive communication deficit: Secondary | ICD-10-CM | POA: Diagnosis not present

## 2021-03-29 DIAGNOSIS — M6281 Muscle weakness (generalized): Secondary | ICD-10-CM | POA: Diagnosis not present

## 2021-03-29 DIAGNOSIS — R296 Repeated falls: Secondary | ICD-10-CM | POA: Diagnosis not present

## 2021-03-29 DIAGNOSIS — I10 Essential (primary) hypertension: Secondary | ICD-10-CM | POA: Diagnosis not present

## 2021-03-29 DIAGNOSIS — R2681 Unsteadiness on feet: Secondary | ICD-10-CM | POA: Diagnosis not present

## 2021-03-30 DIAGNOSIS — A048 Other specified bacterial intestinal infections: Secondary | ICD-10-CM | POA: Diagnosis not present

## 2021-03-30 DIAGNOSIS — R296 Repeated falls: Secondary | ICD-10-CM | POA: Diagnosis not present

## 2021-03-30 DIAGNOSIS — R41841 Cognitive communication deficit: Secondary | ICD-10-CM | POA: Diagnosis not present

## 2021-03-30 DIAGNOSIS — R2681 Unsteadiness on feet: Secondary | ICD-10-CM | POA: Diagnosis not present

## 2021-03-30 DIAGNOSIS — I10 Essential (primary) hypertension: Secondary | ICD-10-CM | POA: Diagnosis not present

## 2021-03-30 DIAGNOSIS — M6281 Muscle weakness (generalized): Secondary | ICD-10-CM | POA: Diagnosis not present

## 2021-04-03 DIAGNOSIS — I10 Essential (primary) hypertension: Secondary | ICD-10-CM | POA: Diagnosis not present

## 2021-04-03 DIAGNOSIS — R296 Repeated falls: Secondary | ICD-10-CM | POA: Diagnosis not present

## 2021-04-03 DIAGNOSIS — M6281 Muscle weakness (generalized): Secondary | ICD-10-CM | POA: Diagnosis not present

## 2021-04-03 DIAGNOSIS — R41841 Cognitive communication deficit: Secondary | ICD-10-CM | POA: Diagnosis not present

## 2021-04-03 DIAGNOSIS — A048 Other specified bacterial intestinal infections: Secondary | ICD-10-CM | POA: Diagnosis not present

## 2021-04-03 DIAGNOSIS — R2681 Unsteadiness on feet: Secondary | ICD-10-CM | POA: Diagnosis not present

## 2021-04-04 DIAGNOSIS — R41841 Cognitive communication deficit: Secondary | ICD-10-CM | POA: Diagnosis not present

## 2021-04-04 DIAGNOSIS — A048 Other specified bacterial intestinal infections: Secondary | ICD-10-CM | POA: Diagnosis not present

## 2021-04-04 DIAGNOSIS — R296 Repeated falls: Secondary | ICD-10-CM | POA: Diagnosis not present

## 2021-04-04 DIAGNOSIS — I10 Essential (primary) hypertension: Secondary | ICD-10-CM | POA: Diagnosis not present

## 2021-04-04 DIAGNOSIS — R2681 Unsteadiness on feet: Secondary | ICD-10-CM | POA: Diagnosis not present

## 2021-04-04 DIAGNOSIS — M6281 Muscle weakness (generalized): Secondary | ICD-10-CM | POA: Diagnosis not present

## 2021-04-05 DIAGNOSIS — R2681 Unsteadiness on feet: Secondary | ICD-10-CM | POA: Diagnosis not present

## 2021-04-05 DIAGNOSIS — M6281 Muscle weakness (generalized): Secondary | ICD-10-CM | POA: Diagnosis not present

## 2021-04-05 DIAGNOSIS — R296 Repeated falls: Secondary | ICD-10-CM | POA: Diagnosis not present

## 2021-04-05 DIAGNOSIS — R41841 Cognitive communication deficit: Secondary | ICD-10-CM | POA: Diagnosis not present

## 2021-04-05 DIAGNOSIS — I10 Essential (primary) hypertension: Secondary | ICD-10-CM | POA: Diagnosis not present

## 2021-04-05 DIAGNOSIS — A048 Other specified bacterial intestinal infections: Secondary | ICD-10-CM | POA: Diagnosis not present

## 2021-04-12 DIAGNOSIS — K523 Indeterminate colitis: Secondary | ICD-10-CM | POA: Diagnosis not present

## 2021-04-12 DIAGNOSIS — M1380 Other specified arthritis, unspecified site: Secondary | ICD-10-CM | POA: Diagnosis not present

## 2021-04-12 DIAGNOSIS — K219 Gastro-esophageal reflux disease without esophagitis: Secondary | ICD-10-CM | POA: Diagnosis not present

## 2021-04-12 DIAGNOSIS — F339 Major depressive disorder, recurrent, unspecified: Secondary | ICD-10-CM | POA: Diagnosis not present

## 2021-04-12 DIAGNOSIS — K648 Other hemorrhoids: Secondary | ICD-10-CM | POA: Diagnosis not present

## 2021-04-12 DIAGNOSIS — I739 Peripheral vascular disease, unspecified: Secondary | ICD-10-CM | POA: Diagnosis not present

## 2021-04-12 DIAGNOSIS — I2510 Atherosclerotic heart disease of native coronary artery without angina pectoris: Secondary | ICD-10-CM | POA: Diagnosis not present

## 2021-04-12 DIAGNOSIS — E782 Mixed hyperlipidemia: Secondary | ICD-10-CM | POA: Diagnosis not present

## 2021-04-12 DIAGNOSIS — M6281 Muscle weakness (generalized): Secondary | ICD-10-CM | POA: Diagnosis not present

## 2021-04-12 DIAGNOSIS — I10 Essential (primary) hypertension: Secondary | ICD-10-CM | POA: Diagnosis not present

## 2021-04-12 DIAGNOSIS — H409 Unspecified glaucoma: Secondary | ICD-10-CM | POA: Diagnosis not present

## 2021-04-12 DIAGNOSIS — N1832 Chronic kidney disease, stage 3b: Secondary | ICD-10-CM | POA: Diagnosis not present

## 2021-04-18 DIAGNOSIS — N6459 Other signs and symptoms in breast: Secondary | ICD-10-CM | POA: Diagnosis not present

## 2021-04-18 DIAGNOSIS — F39 Unspecified mood [affective] disorder: Secondary | ICD-10-CM | POA: Diagnosis not present

## 2021-04-18 DIAGNOSIS — N6452 Nipple discharge: Secondary | ICD-10-CM | POA: Diagnosis not present

## 2021-04-18 DIAGNOSIS — F330 Major depressive disorder, recurrent, mild: Secondary | ICD-10-CM | POA: Diagnosis not present

## 2021-04-18 DIAGNOSIS — R634 Abnormal weight loss: Secondary | ICD-10-CM | POA: Diagnosis not present

## 2021-04-18 DIAGNOSIS — F413 Other mixed anxiety disorders: Secondary | ICD-10-CM | POA: Diagnosis not present

## 2021-04-22 ENCOUNTER — Other Ambulatory Visit (HOSPITAL_COMMUNITY): Payer: Self-pay | Admitting: Nurse Practitioner

## 2021-04-22 ENCOUNTER — Other Ambulatory Visit: Payer: Self-pay | Admitting: Nurse Practitioner

## 2021-04-29 ENCOUNTER — Other Ambulatory Visit: Payer: Self-pay | Admitting: Nurse Practitioner

## 2021-04-29 DIAGNOSIS — N6452 Nipple discharge: Secondary | ICD-10-CM

## 2021-05-02 DIAGNOSIS — F413 Other mixed anxiety disorders: Secondary | ICD-10-CM | POA: Diagnosis not present

## 2021-05-02 DIAGNOSIS — R634 Abnormal weight loss: Secondary | ICD-10-CM | POA: Diagnosis not present

## 2021-05-02 DIAGNOSIS — F330 Major depressive disorder, recurrent, mild: Secondary | ICD-10-CM | POA: Diagnosis not present

## 2021-05-02 DIAGNOSIS — F39 Unspecified mood [affective] disorder: Secondary | ICD-10-CM | POA: Diagnosis not present

## 2021-05-10 DIAGNOSIS — E038 Other specified hypothyroidism: Secondary | ICD-10-CM | POA: Diagnosis not present

## 2021-05-10 DIAGNOSIS — F339 Major depressive disorder, recurrent, unspecified: Secondary | ICD-10-CM | POA: Diagnosis not present

## 2021-05-11 DIAGNOSIS — M6281 Muscle weakness (generalized): Secondary | ICD-10-CM | POA: Diagnosis not present

## 2021-05-11 DIAGNOSIS — M1380 Other specified arthritis, unspecified site: Secondary | ICD-10-CM | POA: Diagnosis not present

## 2021-05-11 DIAGNOSIS — K523 Indeterminate colitis: Secondary | ICD-10-CM | POA: Diagnosis not present

## 2021-05-11 DIAGNOSIS — E038 Other specified hypothyroidism: Secondary | ICD-10-CM | POA: Diagnosis not present

## 2021-05-11 DIAGNOSIS — N1832 Chronic kidney disease, stage 3b: Secondary | ICD-10-CM | POA: Diagnosis not present

## 2021-05-11 DIAGNOSIS — I2510 Atherosclerotic heart disease of native coronary artery without angina pectoris: Secondary | ICD-10-CM | POA: Diagnosis not present

## 2021-05-11 DIAGNOSIS — I739 Peripheral vascular disease, unspecified: Secondary | ICD-10-CM | POA: Diagnosis not present

## 2021-05-11 DIAGNOSIS — J302 Other seasonal allergic rhinitis: Secondary | ICD-10-CM | POA: Diagnosis not present

## 2021-05-11 DIAGNOSIS — H409 Unspecified glaucoma: Secondary | ICD-10-CM | POA: Diagnosis not present

## 2021-05-11 DIAGNOSIS — I10 Essential (primary) hypertension: Secondary | ICD-10-CM | POA: Diagnosis not present

## 2021-05-11 DIAGNOSIS — K648 Other hemorrhoids: Secondary | ICD-10-CM | POA: Diagnosis not present

## 2021-05-11 DIAGNOSIS — F339 Major depressive disorder, recurrent, unspecified: Secondary | ICD-10-CM | POA: Diagnosis not present

## 2021-05-16 DIAGNOSIS — D638 Anemia in other chronic diseases classified elsewhere: Secondary | ICD-10-CM | POA: Diagnosis not present

## 2021-05-16 DIAGNOSIS — R2681 Unsteadiness on feet: Secondary | ICD-10-CM | POA: Diagnosis not present

## 2021-05-16 DIAGNOSIS — M6281 Muscle weakness (generalized): Secondary | ICD-10-CM | POA: Diagnosis not present

## 2021-05-16 DIAGNOSIS — A048 Other specified bacterial intestinal infections: Secondary | ICD-10-CM | POA: Diagnosis not present

## 2021-05-16 DIAGNOSIS — R296 Repeated falls: Secondary | ICD-10-CM | POA: Diagnosis not present

## 2021-05-16 DIAGNOSIS — I10 Essential (primary) hypertension: Secondary | ICD-10-CM | POA: Diagnosis not present

## 2021-05-16 DIAGNOSIS — R4189 Other symptoms and signs involving cognitive functions and awareness: Secondary | ICD-10-CM | POA: Diagnosis not present

## 2021-05-16 DIAGNOSIS — Z9181 History of falling: Secondary | ICD-10-CM | POA: Diagnosis not present

## 2021-05-16 DIAGNOSIS — R41841 Cognitive communication deficit: Secondary | ICD-10-CM | POA: Diagnosis not present

## 2021-05-16 DIAGNOSIS — R278 Other lack of coordination: Secondary | ICD-10-CM | POA: Diagnosis not present

## 2021-05-16 DIAGNOSIS — D649 Anemia, unspecified: Secondary | ICD-10-CM | POA: Diagnosis not present

## 2021-05-17 DIAGNOSIS — R2681 Unsteadiness on feet: Secondary | ICD-10-CM | POA: Diagnosis not present

## 2021-05-17 DIAGNOSIS — A048 Other specified bacterial intestinal infections: Secondary | ICD-10-CM | POA: Diagnosis not present

## 2021-05-17 DIAGNOSIS — Z9181 History of falling: Secondary | ICD-10-CM | POA: Diagnosis not present

## 2021-05-17 DIAGNOSIS — R41841 Cognitive communication deficit: Secondary | ICD-10-CM | POA: Diagnosis not present

## 2021-05-17 DIAGNOSIS — R296 Repeated falls: Secondary | ICD-10-CM | POA: Diagnosis not present

## 2021-05-17 DIAGNOSIS — M6281 Muscle weakness (generalized): Secondary | ICD-10-CM | POA: Diagnosis not present

## 2021-05-18 DIAGNOSIS — Z9181 History of falling: Secondary | ICD-10-CM | POA: Diagnosis not present

## 2021-05-18 DIAGNOSIS — R2681 Unsteadiness on feet: Secondary | ICD-10-CM | POA: Diagnosis not present

## 2021-05-18 DIAGNOSIS — R41841 Cognitive communication deficit: Secondary | ICD-10-CM | POA: Diagnosis not present

## 2021-05-18 DIAGNOSIS — M6281 Muscle weakness (generalized): Secondary | ICD-10-CM | POA: Diagnosis not present

## 2021-05-18 DIAGNOSIS — R296 Repeated falls: Secondary | ICD-10-CM | POA: Diagnosis not present

## 2021-05-18 DIAGNOSIS — A048 Other specified bacterial intestinal infections: Secondary | ICD-10-CM | POA: Diagnosis not present

## 2021-05-19 DIAGNOSIS — R41841 Cognitive communication deficit: Secondary | ICD-10-CM | POA: Diagnosis not present

## 2021-05-19 DIAGNOSIS — R2681 Unsteadiness on feet: Secondary | ICD-10-CM | POA: Diagnosis not present

## 2021-05-19 DIAGNOSIS — Z9181 History of falling: Secondary | ICD-10-CM | POA: Diagnosis not present

## 2021-05-19 DIAGNOSIS — M6281 Muscle weakness (generalized): Secondary | ICD-10-CM | POA: Diagnosis not present

## 2021-05-19 DIAGNOSIS — R296 Repeated falls: Secondary | ICD-10-CM | POA: Diagnosis not present

## 2021-05-19 DIAGNOSIS — A048 Other specified bacterial intestinal infections: Secondary | ICD-10-CM | POA: Diagnosis not present

## 2021-05-22 DIAGNOSIS — D649 Anemia, unspecified: Secondary | ICD-10-CM | POA: Diagnosis not present

## 2021-05-22 DIAGNOSIS — D638 Anemia in other chronic diseases classified elsewhere: Secondary | ICD-10-CM | POA: Diagnosis not present

## 2021-05-22 DIAGNOSIS — M6281 Muscle weakness (generalized): Secondary | ICD-10-CM | POA: Diagnosis not present

## 2021-05-22 DIAGNOSIS — R2681 Unsteadiness on feet: Secondary | ICD-10-CM | POA: Diagnosis not present

## 2021-05-22 DIAGNOSIS — A048 Other specified bacterial intestinal infections: Secondary | ICD-10-CM | POA: Diagnosis not present

## 2021-05-22 DIAGNOSIS — R278 Other lack of coordination: Secondary | ICD-10-CM | POA: Diagnosis not present

## 2021-05-22 DIAGNOSIS — R296 Repeated falls: Secondary | ICD-10-CM | POA: Diagnosis not present

## 2021-05-22 DIAGNOSIS — I10 Essential (primary) hypertension: Secondary | ICD-10-CM | POA: Diagnosis not present

## 2021-05-22 DIAGNOSIS — R41841 Cognitive communication deficit: Secondary | ICD-10-CM | POA: Diagnosis not present

## 2021-05-22 DIAGNOSIS — R4189 Other symptoms and signs involving cognitive functions and awareness: Secondary | ICD-10-CM | POA: Diagnosis not present

## 2021-05-22 DIAGNOSIS — Z9181 History of falling: Secondary | ICD-10-CM | POA: Diagnosis not present

## 2021-05-23 DIAGNOSIS — R2681 Unsteadiness on feet: Secondary | ICD-10-CM | POA: Diagnosis not present

## 2021-05-23 DIAGNOSIS — M6281 Muscle weakness (generalized): Secondary | ICD-10-CM | POA: Diagnosis not present

## 2021-05-23 DIAGNOSIS — Z9181 History of falling: Secondary | ICD-10-CM | POA: Diagnosis not present

## 2021-05-23 DIAGNOSIS — R41841 Cognitive communication deficit: Secondary | ICD-10-CM | POA: Diagnosis not present

## 2021-05-23 DIAGNOSIS — R296 Repeated falls: Secondary | ICD-10-CM | POA: Diagnosis not present

## 2021-05-23 DIAGNOSIS — A048 Other specified bacterial intestinal infections: Secondary | ICD-10-CM | POA: Diagnosis not present

## 2021-05-24 DIAGNOSIS — R41841 Cognitive communication deficit: Secondary | ICD-10-CM | POA: Diagnosis not present

## 2021-05-24 DIAGNOSIS — Z9181 History of falling: Secondary | ICD-10-CM | POA: Diagnosis not present

## 2021-05-24 DIAGNOSIS — R296 Repeated falls: Secondary | ICD-10-CM | POA: Diagnosis not present

## 2021-05-24 DIAGNOSIS — R2681 Unsteadiness on feet: Secondary | ICD-10-CM | POA: Diagnosis not present

## 2021-05-24 DIAGNOSIS — A048 Other specified bacterial intestinal infections: Secondary | ICD-10-CM | POA: Diagnosis not present

## 2021-05-24 DIAGNOSIS — M6281 Muscle weakness (generalized): Secondary | ICD-10-CM | POA: Diagnosis not present

## 2021-05-25 DIAGNOSIS — R41841 Cognitive communication deficit: Secondary | ICD-10-CM | POA: Diagnosis not present

## 2021-05-25 DIAGNOSIS — Z9181 History of falling: Secondary | ICD-10-CM | POA: Diagnosis not present

## 2021-05-25 DIAGNOSIS — R2681 Unsteadiness on feet: Secondary | ICD-10-CM | POA: Diagnosis not present

## 2021-05-25 DIAGNOSIS — M6281 Muscle weakness (generalized): Secondary | ICD-10-CM | POA: Diagnosis not present

## 2021-05-25 DIAGNOSIS — A048 Other specified bacterial intestinal infections: Secondary | ICD-10-CM | POA: Diagnosis not present

## 2021-05-25 DIAGNOSIS — R296 Repeated falls: Secondary | ICD-10-CM | POA: Diagnosis not present

## 2021-05-26 DIAGNOSIS — R296 Repeated falls: Secondary | ICD-10-CM | POA: Diagnosis not present

## 2021-05-26 DIAGNOSIS — R41841 Cognitive communication deficit: Secondary | ICD-10-CM | POA: Diagnosis not present

## 2021-05-26 DIAGNOSIS — M6281 Muscle weakness (generalized): Secondary | ICD-10-CM | POA: Diagnosis not present

## 2021-05-26 DIAGNOSIS — R2681 Unsteadiness on feet: Secondary | ICD-10-CM | POA: Diagnosis not present

## 2021-05-26 DIAGNOSIS — A048 Other specified bacterial intestinal infections: Secondary | ICD-10-CM | POA: Diagnosis not present

## 2021-05-26 DIAGNOSIS — Z9181 History of falling: Secondary | ICD-10-CM | POA: Diagnosis not present

## 2021-05-27 DIAGNOSIS — R296 Repeated falls: Secondary | ICD-10-CM | POA: Diagnosis not present

## 2021-05-27 DIAGNOSIS — M6281 Muscle weakness (generalized): Secondary | ICD-10-CM | POA: Diagnosis not present

## 2021-05-27 DIAGNOSIS — R2681 Unsteadiness on feet: Secondary | ICD-10-CM | POA: Diagnosis not present

## 2021-05-27 DIAGNOSIS — R41841 Cognitive communication deficit: Secondary | ICD-10-CM | POA: Diagnosis not present

## 2021-05-27 DIAGNOSIS — Z9181 History of falling: Secondary | ICD-10-CM | POA: Diagnosis not present

## 2021-05-27 DIAGNOSIS — A048 Other specified bacterial intestinal infections: Secondary | ICD-10-CM | POA: Diagnosis not present

## 2021-05-30 DIAGNOSIS — R2681 Unsteadiness on feet: Secondary | ICD-10-CM | POA: Diagnosis not present

## 2021-05-30 DIAGNOSIS — Z9181 History of falling: Secondary | ICD-10-CM | POA: Diagnosis not present

## 2021-05-30 DIAGNOSIS — R296 Repeated falls: Secondary | ICD-10-CM | POA: Diagnosis not present

## 2021-05-30 DIAGNOSIS — A048 Other specified bacterial intestinal infections: Secondary | ICD-10-CM | POA: Diagnosis not present

## 2021-05-30 DIAGNOSIS — M6281 Muscle weakness (generalized): Secondary | ICD-10-CM | POA: Diagnosis not present

## 2021-05-30 DIAGNOSIS — R41841 Cognitive communication deficit: Secondary | ICD-10-CM | POA: Diagnosis not present

## 2021-05-31 DIAGNOSIS — A048 Other specified bacterial intestinal infections: Secondary | ICD-10-CM | POA: Diagnosis not present

## 2021-05-31 DIAGNOSIS — R2681 Unsteadiness on feet: Secondary | ICD-10-CM | POA: Diagnosis not present

## 2021-05-31 DIAGNOSIS — M6281 Muscle weakness (generalized): Secondary | ICD-10-CM | POA: Diagnosis not present

## 2021-05-31 DIAGNOSIS — R296 Repeated falls: Secondary | ICD-10-CM | POA: Diagnosis not present

## 2021-05-31 DIAGNOSIS — R41841 Cognitive communication deficit: Secondary | ICD-10-CM | POA: Diagnosis not present

## 2021-05-31 DIAGNOSIS — Z9181 History of falling: Secondary | ICD-10-CM | POA: Diagnosis not present

## 2021-06-02 DIAGNOSIS — A048 Other specified bacterial intestinal infections: Secondary | ICD-10-CM | POA: Diagnosis not present

## 2021-06-02 DIAGNOSIS — J302 Other seasonal allergic rhinitis: Secondary | ICD-10-CM | POA: Diagnosis not present

## 2021-06-02 DIAGNOSIS — R2681 Unsteadiness on feet: Secondary | ICD-10-CM | POA: Diagnosis not present

## 2021-06-02 DIAGNOSIS — K648 Other hemorrhoids: Secondary | ICD-10-CM | POA: Diagnosis not present

## 2021-06-02 DIAGNOSIS — I10 Essential (primary) hypertension: Secondary | ICD-10-CM | POA: Diagnosis not present

## 2021-06-02 DIAGNOSIS — K219 Gastro-esophageal reflux disease without esophagitis: Secondary | ICD-10-CM | POA: Diagnosis not present

## 2021-06-02 DIAGNOSIS — R41841 Cognitive communication deficit: Secondary | ICD-10-CM | POA: Diagnosis not present

## 2021-06-02 DIAGNOSIS — N1832 Chronic kidney disease, stage 3b: Secondary | ICD-10-CM | POA: Diagnosis not present

## 2021-06-02 DIAGNOSIS — R296 Repeated falls: Secondary | ICD-10-CM | POA: Diagnosis not present

## 2021-06-02 DIAGNOSIS — Z9181 History of falling: Secondary | ICD-10-CM | POA: Diagnosis not present

## 2021-06-02 DIAGNOSIS — I2510 Atherosclerotic heart disease of native coronary artery without angina pectoris: Secondary | ICD-10-CM | POA: Diagnosis not present

## 2021-06-02 DIAGNOSIS — I739 Peripheral vascular disease, unspecified: Secondary | ICD-10-CM | POA: Diagnosis not present

## 2021-06-02 DIAGNOSIS — M6281 Muscle weakness (generalized): Secondary | ICD-10-CM | POA: Diagnosis not present

## 2021-06-03 ENCOUNTER — Other Ambulatory Visit: Payer: Self-pay

## 2021-06-03 ENCOUNTER — Ambulatory Visit
Admission: RE | Admit: 2021-06-03 | Discharge: 2021-06-03 | Disposition: A | Discharge status: Home / Self Care | Payer: Medicare Other | Source: Ambulatory Visit | Attending: Nurse Practitioner | Admitting: Nurse Practitioner

## 2021-06-03 DIAGNOSIS — N6452 Nipple discharge: Secondary | ICD-10-CM

## 2021-06-03 DIAGNOSIS — R928 Other abnormal and inconclusive findings on diagnostic imaging of breast: Secondary | ICD-10-CM | POA: Diagnosis not present

## 2021-06-06 DIAGNOSIS — A048 Other specified bacterial intestinal infections: Secondary | ICD-10-CM | POA: Diagnosis not present

## 2021-06-06 DIAGNOSIS — R41841 Cognitive communication deficit: Secondary | ICD-10-CM | POA: Diagnosis not present

## 2021-06-06 DIAGNOSIS — Z9181 History of falling: Secondary | ICD-10-CM | POA: Diagnosis not present

## 2021-06-06 DIAGNOSIS — R296 Repeated falls: Secondary | ICD-10-CM | POA: Diagnosis not present

## 2021-06-06 DIAGNOSIS — R2681 Unsteadiness on feet: Secondary | ICD-10-CM | POA: Diagnosis not present

## 2021-06-06 DIAGNOSIS — K922 Gastrointestinal hemorrhage, unspecified: Secondary | ICD-10-CM | POA: Diagnosis not present

## 2021-06-06 DIAGNOSIS — M6281 Muscle weakness (generalized): Secondary | ICD-10-CM | POA: Diagnosis not present

## 2021-06-07 DIAGNOSIS — Z9181 History of falling: Secondary | ICD-10-CM | POA: Diagnosis not present

## 2021-06-07 DIAGNOSIS — M6281 Muscle weakness (generalized): Secondary | ICD-10-CM | POA: Diagnosis not present

## 2021-06-07 DIAGNOSIS — R296 Repeated falls: Secondary | ICD-10-CM | POA: Diagnosis not present

## 2021-06-07 DIAGNOSIS — R2681 Unsteadiness on feet: Secondary | ICD-10-CM | POA: Diagnosis not present

## 2021-06-07 DIAGNOSIS — R41841 Cognitive communication deficit: Secondary | ICD-10-CM | POA: Diagnosis not present

## 2021-06-07 DIAGNOSIS — A048 Other specified bacterial intestinal infections: Secondary | ICD-10-CM | POA: Diagnosis not present

## 2021-06-08 DIAGNOSIS — Z9181 History of falling: Secondary | ICD-10-CM | POA: Diagnosis not present

## 2021-06-08 DIAGNOSIS — R41841 Cognitive communication deficit: Secondary | ICD-10-CM | POA: Diagnosis not present

## 2021-06-08 DIAGNOSIS — M6281 Muscle weakness (generalized): Secondary | ICD-10-CM | POA: Diagnosis not present

## 2021-06-08 DIAGNOSIS — R2681 Unsteadiness on feet: Secondary | ICD-10-CM | POA: Diagnosis not present

## 2021-06-08 DIAGNOSIS — A048 Other specified bacterial intestinal infections: Secondary | ICD-10-CM | POA: Diagnosis not present

## 2021-06-08 DIAGNOSIS — R296 Repeated falls: Secondary | ICD-10-CM | POA: Diagnosis not present

## 2021-06-09 DIAGNOSIS — R41841 Cognitive communication deficit: Secondary | ICD-10-CM | POA: Diagnosis not present

## 2021-06-09 DIAGNOSIS — Z9181 History of falling: Secondary | ICD-10-CM | POA: Diagnosis not present

## 2021-06-09 DIAGNOSIS — R296 Repeated falls: Secondary | ICD-10-CM | POA: Diagnosis not present

## 2021-06-09 DIAGNOSIS — R2681 Unsteadiness on feet: Secondary | ICD-10-CM | POA: Diagnosis not present

## 2021-06-09 DIAGNOSIS — A048 Other specified bacterial intestinal infections: Secondary | ICD-10-CM | POA: Diagnosis not present

## 2021-06-09 DIAGNOSIS — M6281 Muscle weakness (generalized): Secondary | ICD-10-CM | POA: Diagnosis not present

## 2021-06-12 DIAGNOSIS — Z9181 History of falling: Secondary | ICD-10-CM | POA: Diagnosis not present

## 2021-06-12 DIAGNOSIS — R2681 Unsteadiness on feet: Secondary | ICD-10-CM | POA: Diagnosis not present

## 2021-06-12 DIAGNOSIS — R41841 Cognitive communication deficit: Secondary | ICD-10-CM | POA: Diagnosis not present

## 2021-06-12 DIAGNOSIS — A048 Other specified bacterial intestinal infections: Secondary | ICD-10-CM | POA: Diagnosis not present

## 2021-06-12 DIAGNOSIS — M6281 Muscle weakness (generalized): Secondary | ICD-10-CM | POA: Diagnosis not present

## 2021-06-12 DIAGNOSIS — R296 Repeated falls: Secondary | ICD-10-CM | POA: Diagnosis not present

## 2021-06-13 DIAGNOSIS — F413 Other mixed anxiety disorders: Secondary | ICD-10-CM | POA: Diagnosis not present

## 2021-06-13 DIAGNOSIS — Z9181 History of falling: Secondary | ICD-10-CM | POA: Diagnosis not present

## 2021-06-13 DIAGNOSIS — M6281 Muscle weakness (generalized): Secondary | ICD-10-CM | POA: Diagnosis not present

## 2021-06-13 DIAGNOSIS — F330 Major depressive disorder, recurrent, mild: Secondary | ICD-10-CM | POA: Diagnosis not present

## 2021-06-13 DIAGNOSIS — F39 Unspecified mood [affective] disorder: Secondary | ICD-10-CM | POA: Diagnosis not present

## 2021-06-13 DIAGNOSIS — R2681 Unsteadiness on feet: Secondary | ICD-10-CM | POA: Diagnosis not present

## 2021-06-13 DIAGNOSIS — A048 Other specified bacterial intestinal infections: Secondary | ICD-10-CM | POA: Diagnosis not present

## 2021-06-13 DIAGNOSIS — R41841 Cognitive communication deficit: Secondary | ICD-10-CM | POA: Diagnosis not present

## 2021-06-13 DIAGNOSIS — R296 Repeated falls: Secondary | ICD-10-CM | POA: Diagnosis not present

## 2021-06-14 DIAGNOSIS — L603 Nail dystrophy: Secondary | ICD-10-CM | POA: Diagnosis not present

## 2021-06-14 DIAGNOSIS — L84 Corns and callosities: Secondary | ICD-10-CM | POA: Diagnosis not present

## 2021-06-14 DIAGNOSIS — I739 Peripheral vascular disease, unspecified: Secondary | ICD-10-CM | POA: Diagnosis not present

## 2021-06-15 DIAGNOSIS — Z23 Encounter for immunization: Secondary | ICD-10-CM | POA: Diagnosis not present

## 2021-06-17 DIAGNOSIS — H409 Unspecified glaucoma: Secondary | ICD-10-CM | POA: Diagnosis not present

## 2021-06-17 DIAGNOSIS — K219 Gastro-esophageal reflux disease without esophagitis: Secondary | ICD-10-CM | POA: Diagnosis not present

## 2021-06-17 DIAGNOSIS — E038 Other specified hypothyroidism: Secondary | ICD-10-CM | POA: Diagnosis not present

## 2021-06-17 DIAGNOSIS — F339 Major depressive disorder, recurrent, unspecified: Secondary | ICD-10-CM | POA: Diagnosis not present

## 2021-06-17 DIAGNOSIS — N1832 Chronic kidney disease, stage 3b: Secondary | ICD-10-CM | POA: Diagnosis not present

## 2021-06-17 DIAGNOSIS — I10 Essential (primary) hypertension: Secondary | ICD-10-CM | POA: Diagnosis not present

## 2021-06-17 DIAGNOSIS — I739 Peripheral vascular disease, unspecified: Secondary | ICD-10-CM | POA: Diagnosis not present

## 2021-06-17 DIAGNOSIS — E782 Mixed hyperlipidemia: Secondary | ICD-10-CM | POA: Diagnosis not present

## 2021-06-17 DIAGNOSIS — J302 Other seasonal allergic rhinitis: Secondary | ICD-10-CM | POA: Diagnosis not present

## 2021-06-17 DIAGNOSIS — M1380 Other specified arthritis, unspecified site: Secondary | ICD-10-CM | POA: Diagnosis not present

## 2021-06-17 DIAGNOSIS — M6281 Muscle weakness (generalized): Secondary | ICD-10-CM | POA: Diagnosis not present

## 2021-06-25 DIAGNOSIS — N399 Disorder of urinary system, unspecified: Secondary | ICD-10-CM | POA: Diagnosis not present

## 2021-08-04 DIAGNOSIS — I10 Essential (primary) hypertension: Secondary | ICD-10-CM | POA: Diagnosis not present

## 2021-08-04 DIAGNOSIS — K219 Gastro-esophageal reflux disease without esophagitis: Secondary | ICD-10-CM | POA: Diagnosis not present

## 2021-08-04 DIAGNOSIS — N1832 Chronic kidney disease, stage 3b: Secondary | ICD-10-CM | POA: Diagnosis not present

## 2021-08-04 DIAGNOSIS — M6281 Muscle weakness (generalized): Secondary | ICD-10-CM | POA: Diagnosis not present

## 2021-08-04 DIAGNOSIS — R053 Chronic cough: Secondary | ICD-10-CM | POA: Diagnosis not present

## 2021-08-04 DIAGNOSIS — M1380 Other specified arthritis, unspecified site: Secondary | ICD-10-CM | POA: Diagnosis not present

## 2021-08-08 DIAGNOSIS — F413 Other mixed anxiety disorders: Secondary | ICD-10-CM | POA: Diagnosis not present

## 2021-08-08 DIAGNOSIS — F330 Major depressive disorder, recurrent, mild: Secondary | ICD-10-CM | POA: Diagnosis not present

## 2021-08-08 DIAGNOSIS — F39 Unspecified mood [affective] disorder: Secondary | ICD-10-CM | POA: Diagnosis not present

## 2021-08-10 DIAGNOSIS — I739 Peripheral vascular disease, unspecified: Secondary | ICD-10-CM | POA: Diagnosis not present

## 2021-08-10 DIAGNOSIS — K219 Gastro-esophageal reflux disease without esophagitis: Secondary | ICD-10-CM | POA: Diagnosis not present

## 2021-08-10 DIAGNOSIS — N1832 Chronic kidney disease, stage 3b: Secondary | ICD-10-CM | POA: Diagnosis not present

## 2021-08-10 DIAGNOSIS — E038 Other specified hypothyroidism: Secondary | ICD-10-CM | POA: Diagnosis not present

## 2021-08-11 DIAGNOSIS — R059 Cough, unspecified: Secondary | ICD-10-CM | POA: Diagnosis not present

## 2021-08-11 DIAGNOSIS — R0989 Other specified symptoms and signs involving the circulatory and respiratory systems: Secondary | ICD-10-CM | POA: Diagnosis not present

## 2021-08-30 ENCOUNTER — Non-Acute Institutional Stay: Payer: Medicare Other | Admitting: Student

## 2021-08-30 ENCOUNTER — Other Ambulatory Visit: Payer: Self-pay

## 2021-08-30 DIAGNOSIS — R197 Diarrhea, unspecified: Secondary | ICD-10-CM

## 2021-08-30 DIAGNOSIS — Z515 Encounter for palliative care: Secondary | ICD-10-CM

## 2021-08-30 DIAGNOSIS — F0393 Unspecified dementia, unspecified severity, with mood disturbance: Secondary | ICD-10-CM

## 2021-08-30 NOTE — Progress Notes (Signed)
[place] Consult Note Telephone: [phone]  Fax: [phone]    Date of encounter: 08/30/21 4:22 PM PATIENT NAME: [name] [id number]   [phone] (home)  DOB: 1920-11-30 MRN: [id number] PRIMARY CARE PROVIDER:    Dr. Ernst Spell, NP  REFERRING PROVIDER:   Dr. Ernst Spell, NP  RESPONSIBLE PARTY:    Contact Information     Name Relation Home Work Mobile   Easter,[name] Daughter  [phone]   Hemphill,[name] Daughter   [phone]   Thomos Lemons Relative   [phone]        I met face to face with patient and family in the facility. Palliative Care was asked to follow this patient by consultation request of  Dr. Shon Hough to address advance care planning and complex medical decision making. This is a follow up visit.  Spoke with daughter [name] via telephone.                                   ASSESSMENT AND PLAN / RECOMMENDATIONS:   Advance Care Planning/Goals of Care: Goals include to maximize quality of life and symptom management. Patient/health care surrogate gave his/her permission to discuss.  CODE STATUS: DNR  Symptom Management/Plan:  Dementia with mood disturbance. Patient requires assistance with adl's. Staff to reorient/redirect as needed. Monitor for falls, safety. Continue sertraline as directed. Appetite is fair to good; no weight loss reported. Will monitor for functional and cognitive declines.   Diarrhea/loose stools-patient with long history; unclear etiology. Having 1-3 stools daily. Continue loperamide daily. Recommend probiotics and toileting program with OT.   Follow up Palliative Care Visit: Palliative care will continue to follow for complex medical decision making, advance care planning, and clarification of goals. Return in 8 weeks or prn.   This visit was coded based on medical  decision making (MDM).  PPS: 40%  HOSPICE ELIGIBILITY/DIAGNOSIS: TBD  Chief Complaint: Palliative Medicine follow up visit.   HISTORY OF PRESENT ILLNESS:  [name] is a 88 y.o. year old female  with dementia, GERD, chronic lower back pain, hypertension, hypothyroidism, depression.  Patient resides at [facility]. She denies pain, abdominal pain, shortness of breath, constipation, nausea. She does endorse having loose stools; 1-3 stools daily. Takes loperamide daily with relief. Staff report not observing loose stools as patient usually toilets her self and express that she does have episodes of incontinence. She does endorse a fair to good appetite. She sleeping well at night. Daughter reports patient having episodes where her mood is altered. Mood has been stable. No recent infections, ED visits or hospitalizations. A 10-point review of systems is negative, except for the pertinent positives and negatives detailed in the HPI.    History obtained from review of EMR, discussion with primary team, and interview with family, facility staff/caregiver and/or Ms. Drinkard.  I reviewed available labs, medications, imaging, studies and related documents from the EMR.  Records reviewed and summarized above.   Physical Exam: Weight:  104. 8 pounds; stable Pulse 60, resp 16, sats 96% on room air Constitutional: NAD General: frail appearing, thin EYES: anicteric sclera, lids intact, no discharge  ENMT: intact hearing, oral mucous membranes moist, dentition intact CV: S1S2, RRR, no LE edema Pulmonary: LCTA, no increased work of breathing, no cough, room air Abdomen: normo-active BS + 4 quadrants, soft and  non tender GU: deferred MSK: moves all extremities, ambulatory Skin: warm and dry, no rashes or wounds on visible skin Neuro:  no generalized weakness, A & O x 2, forgetful Psych: non-anxious affect, pleasant Hem/lymph/immuno: no widespread bruising   Thank you for the  opportunity to participate in the care of [name].  The palliative care team will continue to follow. Please call our office at [phone] if we can be of additional assistance.   Ezekiel Slocumb, NP   COVID-19 PATIENT SCREENING TOOL Asked and negative response unless otherwise noted:   Have you had symptoms of covid, tested positive or been in contact with someone with symptoms/positive test in the past 5-10 days? No

## 2021-11-09 ENCOUNTER — Non-Acute Institutional Stay: Payer: Medicare Other | Admitting: Student

## 2021-11-09 ENCOUNTER — Other Ambulatory Visit: Payer: Self-pay

## 2021-11-09 DIAGNOSIS — S21002D Unspecified open wound of left breast, subsequent encounter: Secondary | ICD-10-CM

## 2021-11-09 DIAGNOSIS — Z515 Encounter for palliative care: Secondary | ICD-10-CM

## 2021-11-09 DIAGNOSIS — F0393 Unspecified dementia, unspecified severity, with mood disturbance: Secondary | ICD-10-CM

## 2021-11-09 NOTE — Progress Notes (Signed)
? ? ?Manufacturing engineer ?Community Palliative Care Consult Note ?Telephone: [phone]  ?Fax: [phone]  ? ? ?Date of encounter: 11/09/21 ?11:56 AM ?PATIENT NAME: [name] ?[place] ?[place] [address]   ?[phone] (home)  ?DOB: November 08, 1920 ?MRN: [id number] ?PRIMARY CARE PROVIDER:    ?Dr. Ernst Spell, NP ? ?REFERRING PROVIDER:   ?Dr. Ernst Spell, NP ? ?RESPONSIBLE PARTY:    ?Contact Information   ? ? Name Relation Home Work Mobile  ? Easter,[name] Daughter  [phone]  ? Hemphill,[name] Daughter   [phone]  ? Thomos Lemons Relative   [phone]  ? ?  ? ? ? ?I met face to face with patient  in the facility. Palliative Care was asked to follow this patient by consultation request of  Dr. Ernst Spell, NP to address advance care planning and complex medical decision making. This is a follow up visit. ? ?                                 ASSESSMENT AND PLAN / RECOMMENDATIONS:  ? ?Advance Care Planning/Goals of Care: Goals include to maximize quality of life and symptom management. Patient/health care surrogate gave his/her permission to discuss. ?Our advance care planning conversation included a discussion about:    ?The value and importance of advance care planning  ?Experiences with loved ones who have been seriously ill or have died  ?Exploration of personal, cultural or spiritual beliefs that might influence medical decisions  ?CODE STATUS: DNR ? ?Symptom Management/Plan: ? ?Dementia with mood disturbance. Patient requires assistance with adl's. Staff to reorient/redirect as needed. Staff reports increased forgetfulness. Monitor for falls, safety. Continue sertraline as directed. Weight has been stable. Will monitor for functional and cognitive declines.  ? ?Skin wound-patient with recurrent area to nipple of left breast. Area is currently scabbed over. Patient has seen dermatology and OBGYN in the past  regarding area. No pain or tenderness, mild erythema, no palpable mass to breast. Area is currently left open to air; will continue to monitor.  ? ?Follow up Palliative Care Visit: Palliative care will continue to follow for complex medical decision making, advance care planning, and clarification of goals. Return in 8 weeks or prn. ? ? ?This visit was coded based on medical decision making (MDM). ? ?PPS: 40% ? ?HOSPICE ELIGIBILITY/DIAGNOSIS: TBD ? ?Chief Complaint: Palliative medicine follow-up visit ? ?HISTORY OF PRESENT ILLNESS:  [name] is a 88 y.o. year old female  with dementia, GERD, chronic lower back pain, hypertension, hypothyroidism, depression.  ? ?Patient resides at [facility] re[facility].  Staff reports patient has been more forgetful. She denies any pain shortness of breath, constipation or nausea.  She states her loose stools have been managed.  She endorses a good appetite.  She is sleeping well.  She endorses a stable mood. A 10-point review of systems is negative, except for the pertinent positives and negatives detailed in the HPI.  ?  ? ?History obtained from review of EMR, discussion with primary team, and interview with family, facility staff/caregiver and/or Ms. Rings.  ?I reviewed available labs, medications, imaging, studies and related documents from the EMR.  Records reviewed and summarized above.  ? ? ?Physical Exam: ?Weight 105 pounds ?Pulse 68, resp 18, sats 97% on room air ?Constitutional: NAD ?General: frail appearing, thin ?EYES: anicteric sclera, lids intact, no discharge  ?ENMT: intact hearing, oral mucous membranes  moist, dentition intact ?CV: S1S2, RRR, no LE edema ?Pulmonary: LCTA, no increased work of breathing, no cough, room air ?Abdomen: normo-active BS + 4 quadrants, soft and non tender, no ascites ?GU: deferred ?MSK: moves all extremities, ambulatory ?Skin: warm and dry, no rashes, scab to left nipple, left open to air ?Neuro:  no generalized  weakness,  A & O x 2, forgetful ?Psych: non-anxious affect, pleasant ?Hem/lymph/immuno: no widespread bruising ? ? ?Thank you for the opportunity to participate in the care of Ms. Feltman.  The palliative care team will continue to follow. Please call our office at [phone] if we can be of additional assistance.  ? ?Ezekiel Slocumb, NP  ? ?COVID-19 PATIENT SCREENING TOOL ?Asked and negative response unless otherwise noted:  ? ?Have you had symptoms of covid, tested positive or been in contact with someone with symptoms/positive test in the past 5-10 days? No ? ?

## 2022-01-18 ENCOUNTER — Inpatient Hospital Stay (HOSPITAL_COMMUNITY): Payer: Medicare Other | Admitting: Anesthesiology

## 2022-01-18 ENCOUNTER — Encounter (HOSPITAL_COMMUNITY): Payer: Self-pay

## 2022-01-18 ENCOUNTER — Emergency Department (HOSPITAL_COMMUNITY): Payer: Medicare Other

## 2022-01-18 ENCOUNTER — Inpatient Hospital Stay (HOSPITAL_COMMUNITY)
Admission: EM | Admit: 2022-01-18 | Discharge: 2022-01-22 | DRG: 481 | Disposition: A | Discharge status: Skilled Nursing Facility | Payer: Medicare Other | Attending: Internal Medicine | Admitting: Internal Medicine

## 2022-01-18 ENCOUNTER — Other Ambulatory Visit: Payer: Self-pay

## 2022-01-18 ENCOUNTER — Inpatient Hospital Stay (HOSPITAL_COMMUNITY): Payer: Medicare Other

## 2022-01-18 DIAGNOSIS — W19XXXA Unspecified fall, initial encounter: Secondary | ICD-10-CM | POA: Diagnosis present

## 2022-01-18 DIAGNOSIS — Z96643 Presence of artificial hip joint, bilateral: Secondary | ICD-10-CM | POA: Diagnosis present

## 2022-01-18 DIAGNOSIS — I160 Hypertensive urgency: Secondary | ICD-10-CM | POA: Diagnosis present

## 2022-01-18 DIAGNOSIS — F32A Depression, unspecified: Secondary | ICD-10-CM | POA: Diagnosis present

## 2022-01-18 DIAGNOSIS — Z85828 Personal history of other malignant neoplasm of skin: Secondary | ICD-10-CM | POA: Diagnosis not present

## 2022-01-18 DIAGNOSIS — Z79899 Other long term (current) drug therapy: Secondary | ICD-10-CM | POA: Diagnosis not present

## 2022-01-18 DIAGNOSIS — D638 Anemia in other chronic diseases classified elsewhere: Secondary | ICD-10-CM | POA: Diagnosis present

## 2022-01-18 DIAGNOSIS — E782 Mixed hyperlipidemia: Secondary | ICD-10-CM | POA: Diagnosis present

## 2022-01-18 DIAGNOSIS — D72829 Elevated white blood cell count, unspecified: Secondary | ICD-10-CM | POA: Diagnosis not present

## 2022-01-18 DIAGNOSIS — E785 Hyperlipidemia, unspecified: Secondary | ICD-10-CM | POA: Diagnosis present

## 2022-01-18 DIAGNOSIS — Z66 Do not resuscitate: Secondary | ICD-10-CM | POA: Diagnosis present

## 2022-01-18 DIAGNOSIS — I10 Essential (primary) hypertension: Secondary | ICD-10-CM | POA: Diagnosis present

## 2022-01-18 DIAGNOSIS — W010XXA Fall on same level from slipping, tripping and stumbling without subsequent striking against object, initial encounter: Secondary | ICD-10-CM | POA: Diagnosis present

## 2022-01-18 DIAGNOSIS — D62 Acute posthemorrhagic anemia: Secondary | ICD-10-CM | POA: Diagnosis not present

## 2022-01-18 DIAGNOSIS — S72141A Displaced intertrochanteric fracture of right femur, initial encounter for closed fracture: Secondary | ICD-10-CM | POA: Diagnosis present

## 2022-01-18 DIAGNOSIS — Z885 Allergy status to narcotic agent status: Secondary | ICD-10-CM

## 2022-01-18 DIAGNOSIS — Z961 Presence of intraocular lens: Secondary | ICD-10-CM | POA: Diagnosis present

## 2022-01-18 DIAGNOSIS — S72001A Fracture of unspecified part of neck of right femur, initial encounter for closed fracture: Secondary | ICD-10-CM

## 2022-01-18 DIAGNOSIS — F03B18 Unspecified dementia, moderate, with other behavioral disturbance: Secondary | ICD-10-CM | POA: Diagnosis not present

## 2022-01-18 DIAGNOSIS — M1990 Unspecified osteoarthritis, unspecified site: Secondary | ICD-10-CM | POA: Diagnosis not present

## 2022-01-18 DIAGNOSIS — Z9049 Acquired absence of other specified parts of digestive tract: Secondary | ICD-10-CM

## 2022-01-18 DIAGNOSIS — Z79890 Hormone replacement therapy (postmenopausal): Secondary | ICD-10-CM | POA: Diagnosis not present

## 2022-01-18 DIAGNOSIS — K219 Gastro-esophageal reflux disease without esophagitis: Secondary | ICD-10-CM | POA: Diagnosis present

## 2022-01-18 DIAGNOSIS — F03B0 Unspecified dementia, moderate, without behavioral disturbance, psychotic disturbance, mood disturbance, and anxiety: Secondary | ICD-10-CM | POA: Diagnosis present

## 2022-01-18 DIAGNOSIS — Z9841 Cataract extraction status, right eye: Secondary | ICD-10-CM | POA: Diagnosis not present

## 2022-01-18 DIAGNOSIS — Z888 Allergy status to other drugs, medicaments and biological substances status: Secondary | ICD-10-CM | POA: Diagnosis not present

## 2022-01-18 DIAGNOSIS — M545 Low back pain, unspecified: Secondary | ICD-10-CM | POA: Diagnosis present

## 2022-01-18 DIAGNOSIS — G8929 Other chronic pain: Secondary | ICD-10-CM | POA: Diagnosis present

## 2022-01-18 DIAGNOSIS — E039 Hypothyroidism, unspecified: Secondary | ICD-10-CM | POA: Diagnosis present

## 2022-01-18 DIAGNOSIS — Z9842 Cataract extraction status, left eye: Secondary | ICD-10-CM

## 2022-01-18 DIAGNOSIS — M810 Age-related osteoporosis without current pathological fracture: Secondary | ICD-10-CM | POA: Diagnosis present

## 2022-01-18 DIAGNOSIS — F0390 Unspecified dementia without behavioral disturbance: Secondary | ICD-10-CM | POA: Diagnosis present

## 2022-01-18 LAB — URINALYSIS, ROUTINE W REFLEX MICROSCOPIC
Bacteria, UA: NONE SEEN
Bilirubin Urine: NEGATIVE
Glucose, UA: NEGATIVE mg/dL
Hgb urine dipstick: NEGATIVE
Ketones, ur: NEGATIVE mg/dL
Leukocytes,Ua: NEGATIVE
Nitrite: NEGATIVE
Protein, ur: 30 mg/dL — AB
Specific Gravity, Urine: 1.014 (ref 1.005–1.030)
pH: 6 (ref 5.0–8.0)

## 2022-01-18 LAB — CBC
HCT: 41.8 % (ref 36.0–46.0)
Hemoglobin: 13.8 g/dL (ref 12.0–15.0)
MCH: 31.9 pg (ref 26.0–34.0)
MCHC: 33 g/dL (ref 30.0–36.0)
MCV: 96.5 fL (ref 80.0–100.0)
Platelets: 191 10*3/uL (ref 150–400)
RBC: 4.33 MIL/uL (ref 3.87–5.11)
RDW: 13.3 % (ref 11.5–15.5)
WBC: 11.4 10*3/uL — ABNORMAL HIGH (ref 4.0–10.5)
nRBC: 0 % (ref 0.0–0.2)

## 2022-01-18 LAB — TSH: TSH: 1.892 u[IU]/mL (ref 0.350–4.500)

## 2022-01-18 LAB — BASIC METABOLIC PANEL
Anion gap: 5 (ref 5–15)
BUN: 22 mg/dL (ref 8–23)
CO2: 22 mmol/L (ref 22–32)
Calcium: 9.2 mg/dL (ref 8.9–10.3)
Chloride: 112 mmol/L — ABNORMAL HIGH (ref 98–111)
Creatinine, Ser: 0.92 mg/dL (ref 0.44–1.00)
GFR, Estimated: 56 mL/min — ABNORMAL LOW (ref >60)
Glucose, Bld: 102 mg/dL — ABNORMAL HIGH (ref 70–99)
Potassium: 4 mmol/L (ref 3.5–5.1)
Sodium: 139 mmol/L (ref 135–145)

## 2022-01-18 LAB — SURGICAL PCR SCREEN
MRSA, PCR: POSITIVE — AB
Staphylococcus aureus: POSITIVE — AB

## 2022-01-18 MED ORDER — CHLORHEXIDINE GLUCONATE CLOTH 2 % EX PADS
6.0000 | MEDICATED_PAD | Freq: Every day | CUTANEOUS | Status: DC
  Administered 2022-01-19 – 2022-01-22 (×4): 6 via TOPICAL

## 2022-01-18 MED ORDER — HYDROCODONE-ACETAMINOPHEN 5-325 MG PO TABS: 1.0000 | ORAL_TABLET | Freq: Four times a day (QID) | ORAL | Status: DC | PRN

## 2022-01-18 MED ORDER — MECLIZINE HCL 25 MG PO TABS
25.0000 mg | ORAL_TABLET | Freq: Three times a day (TID) | ORAL | Status: DC | PRN
Start: 2022-01-18 — End: 2022-01-22

## 2022-01-18 MED ORDER — MIRTAZAPINE 15 MG PO TABS
15.0000 mg | ORAL_TABLET | Freq: Every day | ORAL | Status: DC
  Administered 2022-01-18 – 2022-01-21 (×4): 15 mg via ORAL
  Filled 2022-01-18 (×4): qty 1

## 2022-01-18 MED ORDER — BOOST PO LIQD
237.0000 mL | ORAL | Status: DC
Start: 2022-01-18 — End: 2022-01-22
  Administered 2022-01-20 – 2022-01-21 (×2): 237 mL via ORAL
  Filled 2022-01-18 (×7): qty 237

## 2022-01-18 MED ORDER — IPRATROPIUM-ALBUTEROL 0.5-2.5 (3) MG/3ML IN SOLN
3.0000 mL | Freq: Four times a day (QID) | RESPIRATORY_TRACT | Status: DC | PRN
  Administered 2022-01-21: 3 mL via RESPIRATORY_TRACT
  Filled 2022-01-18: qty 3

## 2022-01-18 MED ORDER — FENTANYL CITRATE (PF) 100 MCG/2ML IJ SOLN
INTRAMUSCULAR | Status: AC
  Filled 2022-01-18: qty 2

## 2022-01-18 MED ORDER — DILTIAZEM HCL ER COATED BEADS 120 MG PO CP24
120.0000 mg | ORAL_CAPSULE | Freq: Every day | ORAL | Status: DC
  Administered 2022-01-18 – 2022-01-22 (×5): 120 mg via ORAL
  Filled 2022-01-18 (×5): qty 1

## 2022-01-18 MED ORDER — HYDROCODONE-ACETAMINOPHEN 5-325 MG PO TABS
0.5000 | ORAL_TABLET | Freq: Four times a day (QID) | ORAL | Status: DC | PRN
  Administered 2022-01-18: 0.5 via ORAL
  Filled 2022-01-18: qty 1

## 2022-01-18 MED ORDER — SERTRALINE HCL 100 MG PO TABS
100.0000 mg | ORAL_TABLET | Freq: Every day | ORAL | Status: DC
  Administered 2022-01-18 – 2022-01-22 (×5): 100 mg via ORAL
  Filled 2022-01-18 (×5): qty 1

## 2022-01-18 MED ORDER — CLONIDINE HCL 0.1 MG PO TABS
0.1000 mg | ORAL_TABLET | Freq: Once | ORAL | Status: AC
  Administered 2022-01-18: 0.1 mg via ORAL
  Filled 2022-01-18: qty 1

## 2022-01-18 MED ORDER — METOPROLOL TARTRATE 25 MG PO TABS
50.0000 mg | ORAL_TABLET | Freq: Once | ORAL | Status: AC
Start: 2022-01-18 — End: 2022-01-18
  Administered 2022-01-18: 50 mg via ORAL
  Filled 2022-01-18: qty 2

## 2022-01-18 MED ORDER — LORAZEPAM 2 MG/ML IJ SOLN
0.5000 mg | Freq: Four times a day (QID) | INTRAMUSCULAR | Status: DC | PRN
  Administered 2022-01-20: 0.5 mg via INTRAVENOUS
  Filled 2022-01-18: qty 1

## 2022-01-18 MED ORDER — POVIDONE-IODINE 10 % EX SWAB
2.0000 "application " | Freq: Once | CUTANEOUS | Status: AC
  Administered 2022-01-19: 2 via TOPICAL

## 2022-01-18 MED ORDER — MUPIROCIN 2 % EX OINT
1.0000 "application " | TOPICAL_OINTMENT | Freq: Two times a day (BID) | CUTANEOUS | Status: DC
  Administered 2022-01-18 – 2022-01-22 (×8): 1 via NASAL
  Filled 2022-01-18 (×2): qty 22

## 2022-01-18 MED ORDER — ENOXAPARIN SODIUM 40 MG/0.4ML IJ SOSY: 40.0000 mg | PREFILLED_SYRINGE | INTRAMUSCULAR | Status: DC

## 2022-01-18 MED ORDER — COLESEVELAM HCL 625 MG PO TABS
1875.0000 mg | ORAL_TABLET | Freq: Two times a day (BID) | ORAL | Status: DC
  Administered 2022-01-20 – 2022-01-22 (×4): 1875 mg via ORAL
  Filled 2022-01-18 (×9): qty 3

## 2022-01-18 MED ORDER — LISINOPRIL 20 MG PO TABS
20.0000 mg | ORAL_TABLET | Freq: Every day | ORAL | Status: DC
  Administered 2022-01-18 – 2022-01-22 (×5): 20 mg via ORAL
  Filled 2022-01-18 (×5): qty 1

## 2022-01-18 MED ORDER — PANTOPRAZOLE SODIUM 40 MG PO TBEC
40.0000 mg | DELAYED_RELEASE_TABLET | Freq: Every day | ORAL | Status: DC
  Administered 2022-01-18 – 2022-01-22 (×5): 40 mg via ORAL
  Filled 2022-01-18 (×5): qty 1

## 2022-01-18 MED ORDER — MORPHINE SULFATE (PF) 2 MG/ML IV SOLN
0.5000 mg | INTRAVENOUS | Status: DC | PRN
  Administered 2022-01-19 (×2): 0.5 mg via INTRAVENOUS
  Filled 2022-01-18 (×2): qty 1

## 2022-01-18 MED ORDER — ROPIVACAINE HCL 5 MG/ML IJ SOLN
INTRAMUSCULAR | Status: DC | PRN
  Administered 2022-01-18: 20 mL

## 2022-01-18 MED ORDER — LATANOPROST 0.005 % OP SOLN
1.0000 [drp] | Freq: Every day | OPHTHALMIC | Status: DC
  Administered 2022-01-19 – 2022-01-21 (×3): 1 [drp] via OPHTHALMIC
  Filled 2022-01-18 (×2): qty 2.5

## 2022-01-18 MED ORDER — LOPERAMIDE HCL 2 MG PO CAPS
2.0000 mg | ORAL_CAPSULE | Freq: Every day | ORAL | Status: DC | PRN
Start: 2022-01-18 — End: 2022-01-22

## 2022-01-18 MED ORDER — FERROUS SULFATE 325 (65 FE) MG PO TABS
325.0000 mg | ORAL_TABLET | Freq: Every day | ORAL | Status: DC
  Administered 2022-01-20 – 2022-01-22 (×3): 325 mg via ORAL
  Filled 2022-01-18 (×4): qty 1

## 2022-01-18 MED ORDER — CEFAZOLIN SODIUM-DEXTROSE 2-4 GM/100ML-% IV SOLN
2.0000 g | INTRAVENOUS | Status: AC
  Administered 2022-01-19: 2 g via INTRAVENOUS
  Filled 2022-01-18: qty 100

## 2022-01-18 MED ORDER — LEVOTHYROXINE SODIUM 25 MCG PO TABS
25.0000 ug | ORAL_TABLET | ORAL | Status: DC
  Filled 2022-01-18: qty 1

## 2022-01-18 MED ORDER — CLONIDINE HCL 0.1 MG PO TABS
0.1000 mg | ORAL_TABLET | Freq: Every day | ORAL | Status: DC | PRN
  Administered 2022-01-19: 0.1 mg via ORAL
  Filled 2022-01-18: qty 1

## 2022-01-18 MED ORDER — LEVOTHYROXINE SODIUM 50 MCG PO TABS
50.0000 ug | ORAL_TABLET | ORAL | Status: DC
  Administered 2022-01-20 – 2022-01-22 (×2): 50 ug via ORAL
  Filled 2022-01-18 (×2): qty 1

## 2022-01-18 MED ORDER — LIDOCAINE 4 % EX CREA
TOPICAL_CREAM | Freq: Two times a day (BID) | CUTANEOUS | Status: DC
  Administered 2022-01-19 – 2022-01-21 (×5): 1 via TOPICAL
  Filled 2022-01-18 (×2): qty 5

## 2022-01-18 MED ORDER — DORZOLAMIDE HCL-TIMOLOL MAL 2-0.5 % OP SOLN
1.0000 [drp] | Freq: Two times a day (BID) | OPHTHALMIC | Status: DC
Start: 2022-01-18 — End: 2022-01-22
  Administered 2022-01-19 – 2022-01-22 (×7): 1 [drp] via OPHTHALMIC
  Filled 2022-01-18: qty 10

## 2022-01-18 MED ORDER — COLESEVELAM HCL 3.75 G PO PACK
3.7500 g | PACK | Freq: Every day | ORAL | Status: DC
Start: 2022-01-18 — End: 2022-01-18

## 2022-01-18 MED ORDER — LIDOCAINE HCL 4 % EX CREA: TOPICAL_CREAM | Freq: Two times a day (BID) | CUTANEOUS | Status: DC

## 2022-01-18 MED ORDER — CHLORHEXIDINE GLUCONATE 4 % EX LIQD
60.0000 mL | Freq: Once | CUTANEOUS | Status: AC
  Administered 2022-01-19: 4 via TOPICAL
  Filled 2022-01-18: qty 15

## 2022-01-18 MED ORDER — METOPROLOL TARTRATE 50 MG PO TABS
50.0000 mg | ORAL_TABLET | Freq: Two times a day (BID) | ORAL | Status: DC
  Administered 2022-01-18 – 2022-01-22 (×8): 50 mg via ORAL
  Filled 2022-01-18 (×8): qty 1

## 2022-01-18 NOTE — ED Notes (Signed)
ED TO INPATIENT HANDOFF REPORT  ED Nurse Name and Phone #: Tama High  S Name/Age/Gender [name] 88 y.o. female Room/Bed: 042C/042C  Code Status   Code Status: Prior  Home/SNF/Other Skilled nursing facility Patient oriented to: self   Triage Complete: Triage complete  Chief Complaint Closed intertrochanteric fracture of right femur [facility]) [S72.141A]  Triage Note Patient from [facility] and was making up her bed and lost balance.  Right leg shortening and rotation.     Allergies Allergies  Allergen Reactions   Codeine Other (See Comments)    "I feel like I'm floating"    Bentyl [Dicyclomine]    Gabapentin Other (See Comments)    Cannot be on for a good length of time- causes a lot of lethargy   Tramadol Other (See Comments)    On MAR   Other Other (See Comments)    All pain meds cause lethargy and disorientation    Level of Care/Admitting Diagnosis ED Disposition     ED Disposition  Admit   Condition  --   [place]: [facility] [[id number]]  Level of Care: Telemetry Surgical [105]  May admit patient to [facility] or [facility] if equivalent level of care is available:: No  Covid Evaluation: Asymptomatic - no recent exposure (last 10 days) testing not required  Diagnosis: Closed intertrochanteric fracture of right femur [facility]) [[id number]]  Admitting Physician: Norval Morton [[id number]]  Attending Physician: Norval Morton [[id number]]  Estimated length of stay: past midnight tomorrow  Certification:: I certify this patient will need inpatient services for at least 2 midnights          B Medical/Surgery History Past Medical History:  Diagnosis Date   Anemia    Anginal pain ([place])    Arthritis    "all over; doesn't seem to bother me much" (08/22/2013)   Chronic lower back pain    Dysrhythmia    "irregular heart beat"    Exertional shortness of breath    GERD (gastroesophageal reflux disease)    Hypertension     Hypothyroidism    Migraines    "used to"   Mixed hyperlipidemia    Osteoporosis    Paroxysmal supraventricular tachycardia ([place])    Skin cancer    "couple taken off right leg" (08/22/2013)   Past Surgical History:  Procedure Laterality Date   ABDOMINAL HYSTERECTOMY     ANTERIOR APPROACH HEMI HIP ARTHROPLASTY Left 10/18/2016   Procedure: ANTERIOR APPROACH LEFT HEMI HIP ARTHROPLASTY;  Surgeon: Leandrew Koyanagi, MD;  Location: [place];  Service: Orthopedics;  Laterality: Left;   APPENDECTOMY  08/22/2013   BACK SURGERY     CATARACT EXTRACTION W/ INTRAOCULAR LENS  IMPLANT, BILATERAL     LAPAROSCOPIC APPENDECTOMY N/A 08/22/2013   Procedure: APPENDECTOMY LAPAROSCOPIC;  Surgeon: Imogene Burn. [name], MD;  Location: [place];  Service: General;  Laterality: N/A;   LUMBAR [place] SURGERY  2008   THROAT SURGERY  1940's; ?   "cysts removed"      A IV Location/Drains/Wounds Patient Lines/Drains/Airways Status     Active Line/Drains/Airways     Name Placement date Placement time Site Days   Peripheral IV 01/18/22 20 G Left;Posterior Hand 01/18/22  0745  Hand  less than 1   Peripheral IV 01/18/22 20 G Posterior;Right Forearm 01/18/22  0833  Forearm  less than 1   External Urinary Catheter 01/18/22  0841  --  less than 1  Intake/Output Last 24 hours No intake or output data in the 24 hours ending 01/18/22 1325  Labs/Imaging Results for orders placed or performed during the hospital encounter of 01/18/22 (from the past 48 hour(s))  CBC     Status: Abnormal   Collection Time: 01/18/22  8:35 AM  Result Value Ref Range   WBC 11.4 (H) 4.0 - 10.5 K/uL   RBC 4.33 3.87 - 5.11 MIL/uL   Hemoglobin 13.8 12.0 - 15.0 g/dL   HCT 41.8 36.0 - 46.0 %   MCV 96.5 80.0 - 100.0 fL   MCH 31.9 26.0 - 34.0 pg   MCHC 33.0 30.0 - 36.0 g/dL   RDW 13.3 11.5 - 15.5 %   Platelets 191 150 - 400 K/uL   nRBC 0.0 0.0 - 0.2 %    Comment: Performed at [facility], [place] [address] [place], [place] [address]   Basic metabolic panel     Status: Abnormal   Collection Time: 01/18/22 10:06 AM  Result Value Ref Range   Sodium 139 135 - 145 mmol/L   Potassium 4.0 3.5 - 5.1 mmol/L   Chloride 112 (H) 98 - 111 mmol/L   CO2 22 22 - 32 mmol/L   Glucose, Bld 102 (H) 70 - 99 mg/dL    Comment: Glucose reference range applies only to samples taken after fasting for at least 8 hours.   BUN 22 8 - 23 mg/dL   Creatinine, Ser 0.92 0.44 - 1.00 mg/dL   Calcium 9.2 8.9 - 10.3 mg/dL   GFR, Estimated 56 (L) >60 mL/min    Comment: (NOTE) Calculated using the CKD-EPI Creatinine Equation (2021)    Anion gap 5 5 - 15    Comment: Performed at [facility] [address], [place], [place] [address]   DG Hip Unilat  With Pelvis 2-3 Views Right  Result Date: 01/18/2022 CLINICAL DATA:  Fall. Right hip pain. Deformity. Foreshortened and rotated. EXAM: DG HIP (WITH OR WITHOUT PELVIS) 2-3V RIGHT COMPARISON:  CT abdomen and pelvis 12/01/2018; frontal view of the bilateral hips 11/28/2016 FINDINGS: There is diffuse decreased bone mineralization. Partial visualization of total left hip arthroplasty without periprosthetic lucency to indicate hardware failure or loosening. Acute right intertrochanteric fracture with mild varus angulation and mild comminution. Mild right femoroacetabular joint space narrowing. The bilateral sacroiliac joint spaces are maintained. IMPRESSION: Acute, mildly angulated, and mildly comminuted right intertrochanteric fracture. Electronically Signed   By: Yvonne Kendall M.D.   On: 01/18/2022 08:28    Pending Labs Unresulted Labs (From admission, onward)     Start     Ordered   01/18/22 1244  TSH  Add-on,   AD        01/18/22 1243   Signed and Held  Basic metabolic panel  Tomorrow morning,   R        Signed and Held   Signed and Held  CBC  Tomorrow morning,   R        Signed and Held   Signed and Held  Type and screen [place]  Once,   R       Comments: [place]    Signed and Held            Vitals/Pain Today's Vitals   01/18/22 0945 01/18/22 1030 01/18/22 1045 01/18/22 1130  BP: (!) 127/55 (!) 125/50 (!) 106/41 (!) 139/52  Pulse: (!) 51 (!) 51 (!) 50 (!) 57  Resp:  $'17 14 15 17  'N$ Temp:      TempSrc:      SpO2: 97% 98% 98% 99%  Weight:      Height:      PainSc:        Isolation Precautions No active isolations  Medications Medications  metoprolol tartrate (LOPRESSOR) tablet 50 mg (50 mg Oral Given 01/18/22 0822)  cloNIDine (CATAPRES) tablet 0.1 mg (0.1 mg Oral Given 01/18/22 1610)    Mobility manual wheelchair Moderate fall risk   Focused Assessments    R Recommendations: See Admitting Provider Note  Report given to:   Additional Notes:

## 2022-01-18 NOTE — Plan of Care (Signed)
Patient arrived to unit around 1530 from PACU for [place].  BP elevated. Provider at bedside, meds released and BP meds given. Will reevaluate. Purewick in place. Family at bedside. All questions asked and answered. Bed alarm on. Floor mats in place. All personal belongings in place.   Problem: Clinical Measurements: Goal: Ability to maintain clinical measurements within normal limits will improve Outcome: Progressing Goal: Will remain free from infection Outcome: Progressing Goal: Diagnostic test results will improve Outcome: Progressing Goal: Respiratory complications will improve Outcome: Progressing Goal: Cardiovascular complication will be avoided Outcome: Progressing   Problem: Activity: Goal: Risk for activity intolerance will decrease Outcome: Progressing   Problem: Nutrition: Goal: Adequate nutrition will be maintained Outcome: Progressing   Problem: Elimination: Goal: Will not experience complications related to bowel motility Outcome: Progressing Goal: Will not experience complications related to urinary retention Outcome: Progressing   Problem: Pain Managment: Goal: General experience of comfort will improve Outcome: Progressing   Problem: Safety: Goal: Ability to remain free from injury will improve Outcome: Progressing   Problem: Skin Integrity: Goal: Risk for impaired skin integrity will decrease Outcome: Progressing

## 2022-01-18 NOTE — ED Triage Notes (Signed)
Patient from [facility] and was making up her bed and lost balance.  Right leg shortening and rotation.

## 2022-01-18 NOTE — ED Provider Notes (Signed)
[facility] EMERGENCY DEPARTMENT Provider Note   CSN: [id number] Arrival date & time: 01/18/22  0745     History  Chief Complaint  Patient presents with   [name] is a 88 y.o. female.  Patient with past medical history of dementia, GERD, chronic lower back pain, hypertension, hypothyroidism, and depression presents today from [facility] with complaints of fall. She states  that immediately prior to arrival today, patient was making her bed and her hand slipped and she fell to the ground landing on her right hip. She did not hit her head or loose consciousness and is not on anticoagulation. She endorses significant right hip pain and has been unable to walk since the incident. She denies any other complaints or injuries at this time. Of note, patient has not had her morning blood pressure medications this morning. Of note, patient did have a right hip replacement in 2018 by Dr. Erlinda Hong.  The history is provided by the patient. No language interpreter was used.  Fall      Home Medications Prior to Admission medications   Medication Sig Start Date End Date Taking? Authorizing Provider  benzonatate (TESSALON) 100 MG capsule Take 1 capsule (100 mg total) by mouth 3 (three) times daily as needed for cough. Patient not taking: Reported on 01/18/2020 12/09/18   Eugenie Filler, MD  bimatoprost (LUMIGAN) 0.01 % SOLN Place 1 drop into both eyes at bedtime.    [provider]  brimonidine (ALPHAGAN) 0.15 % ophthalmic solution Place 1 drop into both eyes 3 (three) times daily.     [provider]  Calcium 600-200 MG-UNIT tablet Take 2 tablets by mouth daily.     [provider]  cloNIDine (CATAPRES) 0.1 MG tablet Take 0.1 mg by mouth daily as needed (for SBP>180).    [provider]  Colesevelam HCl (WELCHOL) 3.75 g PACK Take 3.75 g by mouth daily.    [provider]  diltiazem (CARDIZEM CD) 120 MG  24 hr capsule Take 120 mg by mouth daily.  08/31/18   [provider]  dorzolamide-timolol (COSOPT) 22.3-6.8 MG/ML ophthalmic solution Place 1 drop into the left eye 2 (two) times daily. 05/07/14   [provider]  feeding supplement, ENSURE ENLIVE, (ENSURE ENLIVE) LIQD Take 237 mLs by mouth 2 (two) times daily between meals. Patient not taking: Reported on 01/18/2020 10/20/16   Rai, Vernelle Emerald, MD  ferrous sulfate 325 (65 FE) MG EC tablet Take 325 mg by mouth daily with breakfast.     [provider]  fexofenadine (ALLEGRA) 60 MG tablet Take 60 mg by mouth 2 (two) times daily.    [provider]  lactose free nutrition (BOOST) LIQD Take 237 mLs by mouth daily.    [provider]  levothyroxine (SYNTHROID) 25 MCG tablet Take 25 mcg by mouth every other day. Alternating with 33mg    [provider]  levothyroxine (SYNTHROID, LEVOTHROID) 50 MCG tablet Take 50 mcg by mouth every other day.     [provider]  Lidocaine HCl (ASPERCREME LIDOCAINE) 4 % CREA Apply topically in the morning and at bedtime. To affected area of mid-back    [provider]  lisinopril (ZESTRIL) 20 MG tablet Take 20 mg by mouth daily.    [provider]  loperamide (IMODIUM A-D) 2 MG tablet Take 2 mg by mouth daily as needed for diarrhea or loose stools.    [provider]  meclizine (ANTIVERT) 25 MG tablet Take 25 mg by mouth 3 (three) times daily as needed for dizziness.    [provider]  metoprolol (LOPRESSOR) 50 MG tablet Take 50 mg by mouth 2 (two) times daily.    [provider]  mirtazapine (REMERON) 15 MG tablet Take 15 mg by mouth at bedtime.    [provider]  nitroGLYCERIN (NITROSTAT) 0.4 MG SL tablet Place 1 tablet (0.4 mg total) under the tongue every 5 (five) minutes as needed for chest pain. 12/11/18   Danford, Suann Larry, MD  Omega-3 Fatty Acids (FISH OIL) 1000 MG CAPS Take 2,000 mg by mouth  daily.     [provider]  omeprazole (PRILOSEC) 20 MG capsule Take 20 mg by mouth daily.    [provider]  pantoprazole (PROTONIX) 40 MG tablet Take 1 tablet (40 mg total) by mouth 2 (two) times daily before a meal. Take 2 times daily for 2 weeks, then 1 tablet daily/ Patient taking differently: Take 40 mg by mouth daily. then 1 tablet daily 01/20/20   Guilford Shi, MD  Probiotic Product (PROBIOTIC-10 PO) Take 1 capsule by mouth in the morning and at bedtime.    [provider]  sertraline (ZOLOFT) 100 MG tablet Take 100 mg by mouth daily.     [provider]      Allergies    Codeine, Gabapentin, Tramadol, and Other    Review of Systems   Review of Systems  Musculoskeletal:  Positive for arthralgias.  All other systems reviewed and are negative.  Physical Exam Updated Vital Signs BP (!) 233/85 (BP Location: Right Arm)   Pulse 68   Temp 98.4 F (36.9 C) (Oral)   Resp 14   Ht '4\' 11"'$  (1.499 m)   Wt 49.9 kg   SpO2 91% Comment: placed on 2L, had fentanyl with EMS  BMI 22.22 kg/m  Physical Exam Vitals and nursing note reviewed.  Constitutional:      General: She is not in acute distress.    Appearance: Normal appearance. She is normal weight. She is not ill-appearing, toxic-appearing or diaphoretic.  HENT:     Head: Normocephalic and atraumatic.  Eyes:     Extraocular Movements: Extraocular movements intact.     Pupils: Pupils are equal, round, and reactive to light.  Cardiovascular:     Rate and Rhythm: Normal rate and regular rhythm.     Heart sounds: Normal heart sounds.  Pulmonary:     Effort: Pulmonary effort is normal. No respiratory distress.     Breath sounds: Normal breath sounds.  Abdominal:     General: Abdomen is flat.     Palpations: Abdomen is soft.  Musculoskeletal:     Cervical back: Normal range of motion and neck supple.     Comments: Shortening and external rotation noted to the right hip with obvious deformity  noted. DP and PT pulses intact and equal bilaterally. Tenderness noted to palpation  Skin:    General: Skin is warm and dry.  Neurological:     General: No focal deficit present.     Mental Status: She is alert. Mental status is at baseline.  Psychiatric:        Mood and Affect: Mood normal.        Behavior: Behavior normal.    ED Results / Procedures / Treatments   Labs (all labs ordered are listed, but only abnormal results are displayed) Labs Reviewed  CBC - Abnormal;  Notable for the following components:      Result Value   WBC 11.4 (*)    All other components within normal limits  BASIC METABOLIC PANEL    EKG None  Radiology DG Hip Unilat  With Pelvis 2-3 Views Right  Result Date: 01/18/2022 CLINICAL DATA:  Fall. Right hip pain. Deformity. Foreshortened and rotated. EXAM: DG HIP (WITH OR WITHOUT PELVIS) 2-3V RIGHT COMPARISON:  CT abdomen and pelvis 12/01/2018; frontal view of the bilateral hips 11/28/2016 FINDINGS: There is diffuse decreased bone mineralization. Partial visualization of total left hip arthroplasty without periprosthetic lucency to indicate hardware failure or loosening. Acute right intertrochanteric fracture with mild varus angulation and mild comminution. Mild right femoroacetabular joint space narrowing. The bilateral sacroiliac joint spaces are maintained. IMPRESSION: Acute, mildly angulated, and mildly comminuted right intertrochanteric fracture. Electronically Signed   By: Yvonne Kendall M.D.   On: 01/18/2022 08:28    Procedures Procedures    Medications Ordered in ED Medications  metoprolol tartrate (LOPRESSOR) tablet 50 mg (has no administration in time range)  cloNIDine (CATAPRES) tablet 0.1 mg (has no administration in time range)    ED Course/ Medical Decision Making/ A&P                           Medical Decision Making Amount and/or Complexity of Data Reviewed Labs: ordered. Radiology: ordered.  Risk Prescription drug  management.   This patient presents to the ED for concern of fall, right hip pain, this involves an extensive number of treatment options, and is a complaint that carries with it a high risk of complications and morbidity.   Co morbidities that complicate the patient evaluation  Hx dementia, hypertension  Lab Tests:  I Ordered, and personally interpreted labs.  The pertinent results include:  WBC 11.4, likely reactive  Imaging Studies ordered:  I ordered imaging studies including DG right hip  I independently visualized and interpreted imaging which showed  Acute, mildly angulated, and mildly comminuted right intertrochanteric fracture. I agree with the radiologist interpretation   Consultations Obtained:  I requested consultation with the orthopedics Hilbert Odor, PA-C,  and discussed lab and imaging findings as well as pertinent plan - they recommend: admit to medicine, they will see the patient   Problem List / ED Course / Critical interventions / Medication management  I did not order pain medication as she received 50 mcg Fentanyl by EMS PTA which appeared to be more than sufficient to manage the patients pain at this time. Ordered patients home blood pressure medications as she had not had them this morning with significant improvement   Test / Admission - Considered:  Patient presents today with complaints of fall with right hip pain. X-ray shows right intertrochanteric fracture. Will need admission for same. Patient did have left hip replacement in 2018 by [facility] Dr. Erlinda Hong. She requests that Dr. Erlinda Hong perform this procedure as well. Patient is understanding and amenable with plan.  Discussed patient with hospitalist who agrees to admit.   This is a shared visit with supervising physician Dr. Sabra Heck who has independently evaluated patient & provided guidance in evaluation/management/disposition, in agreement with care    Final Clinical Impression(s) / ED  Diagnoses Final diagnoses:  Fall, initial encounter  Closed fracture of right hip, initial encounter [facility])    Rx / DC Orders ED Discharge Orders     None         Nestor Lewandowsky 01/18/22  Rake, Brian, MD 01/24/22 [phone]

## 2022-01-18 NOTE — Consult Note (Signed)
Reason for Consult:Right hip fx Referring Physician: Noemi Chapel Time called: [id number] Time at bedside: [place] is an 88 y.o. female.  HPI: [name] got out of bed at the SN[facility] where she [facility], lost her balance, and fell. She had immediate right hip pain and could not get up. She was brought to the ED where x-rays showed a right hip fx and orthopedic surgery was consulted. She generally uses a WC as she is prone to falls but forgets often.  Past Medical History:  Diagnosis Date   Anemia    Anginal pain ([place])    Arthritis    "all over; doesn't seem to bother me much" (08/22/2013)   Chronic lower back pain    Dysrhythmia    "irregular heart beat"    Exertional shortness of breath    GERD (gastroesophageal reflux disease)    Hypertension    Hypothyroidism    Migraines    "used to"   Mixed hyperlipidemia    Osteoporosis    Paroxysmal supraventricular tachycardia ([place])    Skin cancer    "couple taken off right leg" (08/22/2013)    Past Surgical History:  Procedure Laterality Date   ABDOMINAL HYSTERECTOMY     ANTERIOR APPROACH HEMI HIP ARTHROPLASTY Left 10/18/2016   Procedure: ANTERIOR APPROACH LEFT HEMI HIP ARTHROPLASTY;  Surgeon: Leandrew Koyanagi, MD;  Location: [place];  Service: Orthopedics;  Laterality: Left;   APPENDECTOMY  08/22/2013   BACK SURGERY     CATARACT EXTRACTION W/ INTRAOCULAR LENS  IMPLANT, BILATERAL     LAPAROSCOPIC APPENDECTOMY N/A 08/22/2013   Procedure: APPENDECTOMY LAPAROSCOPIC;  Surgeon: Imogene Burn. [name], MD;  Location: [place];  Service: General;  Laterality: N/A;   LUMBAR [place] SURGERY  2008   THROAT SURGERY  1940's; ?   "cysts removed"     Family History  Family history unknown: Yes    Social History:  reports that she has never smoked. She has never used smokeless tobacco. She reports that she does not drink alcohol and does not use drugs.  Allergies:  Allergies  Allergen Reactions   Codeine Other (See Comments)    "I feel like I'm floating"     Bentyl [Dicyclomine]    Gabapentin Other (See Comments)    Cannot be on for a good length of time- causes a lot of lethargy   Tramadol Other (See Comments)    On MAR   Other Other (See Comments)    All pain meds cause lethargy and disorientation    Medications: I have reviewed the patient's current medications.  Results for orders placed or performed during the hospital encounter of 01/18/22 (from the past 48 hour(s))  CBC     Status: Abnormal   Collection Time: 01/18/22  8:35 AM  Result Value Ref Range   WBC 11.4 (H) 4.0 - 10.5 K/uL   RBC 4.33 3.87 - 5.11 MIL/uL   Hemoglobin 13.8 12.0 - 15.0 g/dL   HCT 41.8 36.0 - 46.0 %   MCV 96.5 80.0 - 100.0 fL   MCH 31.9 26.0 - 34.0 pg   MCHC 33.0 30.0 - 36.0 g/dL   RDW 13.3 11.5 - 15.5 %   Platelets 191 150 - 400 K/uL   nRBC 0.0 0.0 - 0.2 %    Comment: Performed at [facility], [place] [address], [place], [place] [address]  Basic metabolic panel     Status: Abnormal   Collection Time: 01/18/22 10:06 AM  Result  Value Ref Range   Sodium 139 135 - 145 mmol/L   Potassium 4.0 3.5 - 5.1 mmol/L   Chloride 112 (H) 98 - 111 mmol/L   CO2 22 22 - 32 mmol/L   Glucose, Bld 102 (H) 70 - 99 mg/dL    Comment: Glucose reference range applies only to samples taken after fasting for at least 8 hours.   BUN 22 8 - 23 mg/dL   Creatinine, Ser 0.92 0.44 - 1.00 mg/dL   Calcium 9.2 8.9 - 10.3 mg/dL   GFR, Estimated 56 (L) >60 mL/min    Comment: (NOTE) Calculated using the CKD-EPI Creatinine Equation (2021)    Anion gap 5 5 - 15    Comment: Performed at [facility] [address] [place], [place] [address]    DG Hip Unilat  With Pelvis 2-3 Views Right  Result Date: 01/18/2022 CLINICAL DATA:  Fall. Right hip pain. Deformity. Foreshortened and rotated. EXAM: DG HIP (WITH OR WITHOUT PELVIS) 2-3V RIGHT COMPARISON:  CT abdomen and pelvis 12/01/2018; frontal view of the bilateral hips 11/28/2016 FINDINGS: There is diffuse decreased bone  mineralization. Partial visualization of total left hip arthroplasty without periprosthetic lucency to indicate hardware failure or loosening. Acute right intertrochanteric fracture with mild varus angulation and mild comminution. Mild right femoroacetabular joint space narrowing. The bilateral sacroiliac joint spaces are maintained. IMPRESSION: Acute, mildly angulated, and mildly comminuted right intertrochanteric fracture. Electronically Signed   By: Yvonne Kendall M.D.   On: 01/18/2022 08:28    Review of Systems  HENT:  Negative for ear discharge, ear pain, hearing loss and tinnitus.   Eyes:  Negative for photophobia and pain.  Respiratory:  Negative for cough and shortness of breath.   Cardiovascular:  Negative for chest pain.  Gastrointestinal:  Negative for abdominal pain, nausea and vomiting.  Genitourinary:  Negative for dysuria, flank pain, frequency and urgency.  Musculoskeletal:  Positive for arthralgias (Right hip). Negative for back pain, myalgias and neck pain.  Neurological:  Negative for dizziness and headaches.  Hematological:  Does not bruise/bleed easily.  Psychiatric/Behavioral:  The patient is not nervous/anxious.   Blood pressure (!) 106/41, pulse (!) 50, temperature 98.4 F (36.9 C), temperature source Oral, resp. rate 15, height '4\' 11"'$  (1.499 m), weight 49.9 kg, SpO2 98 %. Physical Exam Constitutional:      General: She is not in acute distress.    Appearance: She is well-developed. She is not diaphoretic.  HENT:     Head: Normocephalic and atraumatic.  Eyes:     General: No scleral icterus.       Right eye: No discharge.        Left eye: No discharge.     Conjunctiva/sclera: Conjunctivae normal.  Cardiovascular:     Rate and Rhythm: Normal rate and regular rhythm.  Pulmonary:     Effort: Pulmonary effort is normal. No respiratory distress.  Musculoskeletal:     Cervical back: Normal range of motion.     Comments: RLE No traumatic wounds, ecchymosis, or  rash  Mod TTP hip  No knee or ankle effusion  Knee stable to varus/ valgus and anterior/posterior stress  Sens DPN, SPN, TN intact  Motor EHL, ext, flex, evers 5/5  DP 1+, PT 1+, No significant edema  Skin:    General: Skin is warm and dry.  Neurological:     Mental Status: She is alert.  Psychiatric:        Mood and Affect: Mood normal.  Behavior: Behavior normal.    Assessment/Plan: Right hip fx -- Plan IMN tomorrow by Dr. Erlinda Hong. Please keep NPO after MN.    Lisette Abu, PA-C Orthopedic Surgery [phone] 01/18/2022, 11:07 AM

## 2022-01-18 NOTE — Anesthesia Procedure Notes (Signed)
Anesthesia Regional Block: Peng block   Pre-Anesthetic Checklist: , timeout performed,  Correct Patient, Correct Site, Correct Laterality,  Correct Procedure, Correct Position, site marked,  Risks and benefits discussed,  Surgical consent,  Pre-op evaluation,  At surgeon's request and post-op pain management  Laterality: Right  Prep: chloraprep       Needles:  Injection technique: Single-shot  Needle Type: Echogenic Needle     Needle Length: 9cm  Needle Gauge: 21     Additional Needles:   Procedures:,,,, ultrasound used (permanent image in chart),,    Narrative:  Start time: 01/18/2022 2:27 PM End time: 01/18/2022 2:32 PM Injection made incrementally with aspirations every 5 mL.  Performed by: Personally  Anesthesiologist: Suzette Battiest, MD

## 2022-01-18 NOTE — H&P (Signed)
History and Physical    Patient: [name] [name] ZTI:[id number] DOB: 04/11/1921 DOA: 01/18/2022 DOS: the patient was seen and examined on 01/18/2022 PCP: Marissa Low, [name]  Patient coming from: [name] via EMS  Chief Complaint:  Chief Complaint  Patient presents with   Fall   HPI: [name] [name] is a 88 y.o. female with medical history significant of hypertension, hyperlipidemia, hypothyroidism, history of GI bleed depression, and moderate dementia who presents after having a fall.  History is obtained from the patient with additional history given by her daughter who is present at bedside.  The patient is supposed to get around by wheelchair due to frequent falls.  However, sometime around 6 AM the patient had gotten up on her own.  She reported that she was trying to make her bed when her hand slipped off the in corner causing her to fall landing onto her right hip.  She complained of significant pain in the right hip.  She had not had any of her blood pressure medicines this morning.  Patient had prior left hip replacement by Dr. Erlinda [name] in 2018.  She is not on any blood thinners.    On admission into the emergency department patient was seen to be afebrile with heart rates 50-68, blood pressures elevated up to 233/85, and O2 saturations maintained on room air.  Labs significant for WBC 11.4.  X-rays of the pelvis significant for right mild communicated intertrochanteric fracture.  Patient was given 0.1 mg of clonidine and metoprolol 50 mg p.o.. Orthopedics has been consulted and plan on doing surgery tomorrow.  [name] called to admit.   Review of Systems: As mentioned in the history of present illness. All other systems reviewed and are negative. Past Medical History:  Diagnosis Date   Anemia    Anginal pain ([name])    Arthritis    "all over; doesn't seem to bother me much" (08/22/2013)   Chronic lower back pain    Dysrhythmia    "irregular heart beat"    Exertional shortness of  breath    GERD (gastroesophageal reflux disease)    Hypertension    Hypothyroidism    Migraines    "used to"   Mixed hyperlipidemia    Osteoporosis    Paroxysmal supraventricular tachycardia ([name])    Skin cancer    "couple taken off right leg" (08/22/2013)   Past Surgical History:  Procedure Laterality Date   ABDOMINAL HYSTERECTOMY     ANTERIOR APPROACH HEMI HIP ARTHROPLASTY Left 10/18/2016   Procedure: ANTERIOR APPROACH LEFT HEMI HIP ARTHROPLASTY;  Surgeon: [name] [name];  Location: [name];  Service: Orthopedics;  Laterality: Left;   APPENDECTOMY  08/22/2013   BACK SURGERY     CATARACT EXTRACTION W/ INTRAOCULAR LENS  IMPLANT, BILATERAL     LAPAROSCOPIC APPENDECTOMY N/A 08/22/2013   Procedure: APPENDECTOMY LAPAROSCOPIC;  Surgeon: [name] [name];  Location: [name];  Service: General;  Laterality: N/A;   LUMBAR [name] SURGERY  2008   THROAT SURGERY  1940's; ?   "cysts removed"    Social History:  reports that she has never smoked. She has never used smokeless tobacco. She reports that she does not drink alcohol and does not use drugs.  Allergies  Allergen Reactions   Codeine Other (See Comments)    "I feel like I'm floating"    Bentyl [Dicyclomine]    Gabapentin Other (See Comments)    Cannot be on for a good length of time- causes a lot  of lethargy   Tramadol Other (See Comments)    On MAR   Other Other (See Comments)    All pain meds cause lethargy and disorientation    Family History  Family history unknown: Yes    Prior to Admission medications   Medication Sig Start Date End Date Taking? Authorizing Provider  bimatoprost (LUMIGAN) 0.01 % SOLN Place 1 drop into both eyes at bedtime.   Yes [name] [name]  cloNIDine (CATAPRES) 0.1 MG tablet Take 0.1 mg by mouth daily as needed (for SBP>180).   Yes [name] [name]  diltiazem (CARDIZEM CD) 120 MG 24 hr capsule Take 120 mg by mouth daily.  08/31/18  Yes [name] [name]  dorzolamide-timolol  (COSOPT) 22.3-6.8 MG/ML ophthalmic solution Place 1 drop into the left eye 2 (two) times daily. 05/07/14  Yes [name] [name]  famotidine (PEPCID) 20 MG tablet Take 20 mg by mouth 2 (two) times daily.   Yes [name] [name]  ferrous sulfate 325 (65 FE) MG EC tablet Take 325 mg by mouth daily with breakfast.    Yes [name] [name]  ipratropium-albuterol (DUONEB) 0.5-2.5 (3) MG/3ML SOLN Inhale 3 mLs into the lungs every 6 (six) hours as needed (for COVID).   Yes [name] [name]  levothyroxine (SYNTHROID) 25 MCG tablet Take 25 mcg by mouth every other day. Alternating with 80mg   Yes [name] [name]  levothyroxine (SYNTHROID, LEVOTHROID) 50 MCG tablet Take 50 mcg by mouth every other day.    Yes [name] [name]  lisinopril (ZESTRIL) 20 MG tablet Take 20 mg by mouth daily.   Yes [name] [name]  loperamide (IMODIUM A-D) 2 MG tablet Take 2 mg by mouth daily as needed for diarrhea or loose stools.   Yes [name] [name]  metoprolol (LOPRESSOR) 50 MG tablet Take 50 mg by mouth 2 (two) times daily.   Yes [name] [name]  mirtazapine (REMERON) 15 MG tablet Take 15 mg by mouth at bedtime.   Yes [name] [name]  nitroGLYCERIN (NITROSTAT) 0.4 MG SL tablet Place 1 tablet (0.4 mg total) under the tongue every 5 (five) minutes as needed for chest pain. 12/11/18  Yes [name] [name]  sertraline (ZOLOFT) 100 MG tablet Take 100 mg by mouth daily.    Yes [name] [name]  benzonatate (TESSALON) 100 MG capsule Take 1 capsule (100 mg total) by mouth 3 (three) times daily as needed for cough. Patient not taking: Reported on 01/18/2020 12/09/18   [name] [name]  brimonidine (ALPHAGAN) 0.15 % ophthalmic solution Place 1 drop into both eyes 3 (three) times daily.  Patient not taking: Reported on 01/18/2022    [name] [name]  Calcium 600-200 MG-UNIT tablet Take 2 tablets by mouth daily.     Provider,  Historical, [name]  Colesevelam HCl (WELCHOL) 3.75 g PACK Take 3.75 g by mouth daily.    [name] [name]  feeding supplement, ENSURE ENLIVE, (ENSURE ENLIVE) LIQD Take 237 mLs by mouth 2 (two) times daily between meals. Patient not taking: Reported on 01/18/2020 10/20/16   Rai, RVernelle Emerald [name]  fexofenadine (ALLEGRA) 60 MG tablet Take 60 mg by mouth 2 (two) times daily.    [name] [name]  lactose free nutrition (BOOST) LIQD Take 237 mLs by mouth daily.    [name] [name]  Lidocaine HCl (ASPERCREME LIDOCAINE) 4 % CREA Apply topically in the morning and at bedtime. To affected area of mid-back    [name] [name]  meclizine (ANTIVERT) 25 MG tablet Take 25 mg by mouth 3 (three) times daily as needed for dizziness.    [name] [name]  Omega-3 Fatty Acids (FISH OIL) 1000 MG CAPS Take 2,000 mg by mouth daily.     [name] [name]  omeprazole (PRILOSEC) 20 MG capsule Take 20 mg by mouth daily.    [name] [name]  pantoprazole (PROTONIX) 40 MG tablet Take 1 tablet (40 mg total) by mouth 2 (two) times daily before a meal. Take 2 times daily for 2 weeks, then 1 tablet daily/ Patient taking differently: Take 40 mg by mouth daily. then 1 tablet daily 01/20/20   [place] Shi, [name]  Probiotic Product (PROBIOTIC-10 PO) Take 1 capsule by mouth in the morning and at bedtime.    [name] [name]    Physical Exam: Vitals:   01/18/22 0900 01/18/22 0945 01/18/22 1030 01/18/22 1045  BP: (!) 166/85 (!) 127/55 (!) 125/50 (!) 106/41  Pulse: (!) 59 (!) 51 (!) 51 (!) 50  Resp: '15 17 14 15  '$ Temp:      TempSrc:      SpO2: 94% 97% 98% 98%  Weight:      Height:       Constitutional: Elderly female currently in no acute distress Eyes: PERRL, lids and conjunctivae normal ENMT: Mucous membranes are moist. Posterior pharynx clear of any exudate or lesions.Normal dentition.  Neck: normal, supple, no masses, no thyromegaly Respiratory: clear to auscultation  bilaterally, no wheezing, no crackles. Normal respiratory effort. No accessory muscle use.  Cardiovascular: Regular rate and rhythm, no murmurs / rubs / gallops. No extremity edema. 2+ pedal pulses. No carotid bruits.  Abdomen: no tenderness, no masses palpated. No hepatosplenomegaly. Bowel sounds positive.  Musculoskeletal: no clubbing / cyanosis.  Deformity noted of right hip. Skin: Bleeding of the index finger Neurologic: CN 2-12 grossly intact. Sensation intact, DTR normal. Strength 5/5 in all 4.  Psychiatric: Normal judgment and insight. Alert and oriented x 3. Normal mood.    Data Reviewed:  EKG reveals sinus rhythm at 69 bpm  Assessment and Plan: Closed right intertrochanteric femur fracture secondary to fall Patient presents after having what sounds like a mechanical fall onto her right hip.  Found to have a slightly communicated intertrochanteric hip fracture on the right.  Dr. Erlinda [name] who had performed patient prior left hip arthroplasty was consulted and plans on taking patient to surgery tomorrow for IMN. -Admit to a surgical telemetry bed -Hip fracture order set utilized -Heart healthy diet and n.p.o. after midnight -[name]-dose hydrocodone/morphine as daughter notes patient has had issues with being more altered after receiving pain medicines -Appreciate orthopedic consultative services, we will follow-up for any further recommendations  Leukocytosis Acute.  WBC elevated 11.4.  Suspect secondary to above. -Check chest x-ray and urinalysis  Hypertensive urgency Acute.  Blood pressures initially elevated up to 233/85.  Home blood pressure regimen includes diltiazem 120 mg daily, lisinopril 20 mg daily, metoprolol 50 mg twice daily, and clonidine 0.1 mg as needed for systolic blood pressures greater than 180. -Continue home regimen  Dementia Daughter reports patient has moderate dementia and forgets that she is supposed to get around with a wheelchair.  Patient normally recognizes  family and is alert and oriented to person and place. -Delirium precautions -Bed alarm -Ativan IV as needed for agitation  Hypothyroidism -Check TSH -Continue levothyroxine per current regimen   Advance Care Planning:   Code Status: DNR    Consults: Orthopedic surgery  Family Communication:  Daughter updated at bedside  Severity of Illness: The appropriate patient status for this patient is INPATIENT. Inpatient status is judged to be reasonable and necessary in order to provide the required intensity of service to ensure the patient's safety. The patient's presenting symptoms, physical exam findings, and initial radiographic and laboratory data in the context of their chronic comorbidities is felt to place them at high risk for further clinical deterioration. Furthermore, it is not anticipated that the patient will be medically stable for discharge from the hospital within 2 midnights of admission.   * I certify that at the point of admission it is my clinical judgment that the patient will require inpatient hospital care spanning beyond 2 midnights from the point of admission due to high intensity of service, high risk for further deterioration and high frequency of surveillance required.*  Author: Norval Morton, [name] 01/18/2022 12:29 PM  For on call review [email or web address].

## 2022-01-18 NOTE — Anesthesia Pain Management Evaluation Note (Signed)
  Anesthesia Pain Consult Note  Patient: [name], 88 y.o., female  Consult Requested by: Norval Morton, MD  Reason for Consult: Nerve block  Level of Consciousness: alert  Pain: moderate   Last Vitals:  Vitals:   01/18/22 1300 01/18/22 1400  BP: (!) 174/69 (!) 169/56  Pulse: (!) 57 66  Resp: 15 15  Temp:  37 C  SpO2: 98% 100%    Plan: Peripheral nerve block for pain control  Risks of wet tap, epidural hematoma and spinal cord injury explained to:   Consent:Risks of procedure as well as the alternatives and risks of each were explained to the (patient/caregiver).  Consent for procedure obtained.  Advance Directive:Patient named surrogate decision maker / provided an advance care plan.  Allergies  Allergen Reactions  . Codeine Other (See Comments)    "I feel like I'm floating"   . Bentyl [Dicyclomine]   . Gabapentin Other (See Comments)    Cannot be on for a good length of time- causes a lot of lethargy  . Tramadol Other (See Comments)    On MAR  . Other Other (See Comments)    All pain meds cause lethargy and disorientation    Physical exam: PULM normal  CARDIO Heart sounds are normal.  Regular rate and rhythm without murmur, gallop or rub.  OTHER    I have reviewed the patient's medications listed below.     Past Medical History:  Diagnosis Date  . Anemia   . Anginal pain ([place])   . Arthritis    "all over; doesn't seem to bother me much" (08/22/2013)  . Chronic lower back pain   . Dysrhythmia    "irregular heart beat"   . Exertional shortness of breath   . GERD (gastroesophageal reflux disease)   . Hypertension   . Hypothyroidism   . Migraines    "used to"  . Mixed hyperlipidemia   . Osteoporosis   . Paroxysmal supraventricular tachycardia ([place])   . Skin cancer    "couple taken off right leg" (08/22/2013)   Past Surgical History:  Procedure Laterality Date  . ABDOMINAL HYSTERECTOMY    . ANTERIOR APPROACH HEMI HIP ARTHROPLASTY  Left 10/18/2016   Procedure: ANTERIOR APPROACH LEFT HEMI HIP ARTHROPLASTY;  Surgeon: Leandrew Koyanagi, MD;  Location: [facility];  Service: Orthopedics;  Laterality: Left;  . APPENDECTOMY  08/22/2013  . BACK SURGERY    . CATARACT EXTRACTION W/ INTRAOCULAR LENS  IMPLANT, BILATERAL    . LAPAROSCOPIC APPENDECTOMY N/A 08/22/2013   Procedure: APPENDECTOMY LAPAROSCOPIC;  Surgeon: Imogene Burn. [name], MD;  Location: [facility];  Service: General;  Laterality: N/A;  . LUMBAR [place] SURGERY  2008  . THROAT SURGERY  1940's; ?   "cysts removed"     reports that she has never smoked. She has never used smokeless tobacco. She reports that she does not drink alcohol and does not use drugs.    Tiajuana Amass 01/18/2022

## 2022-01-19 ENCOUNTER — Inpatient Hospital Stay (HOSPITAL_COMMUNITY): Payer: Medicare Other | Admitting: Anesthesiology

## 2022-01-19 ENCOUNTER — Encounter (HOSPITAL_COMMUNITY)
Admission: EM | Disposition: A | Discharge status: Skilled Nursing Facility | Payer: Self-pay | Source: Home / Self Care | Attending: Internal Medicine

## 2022-01-19 ENCOUNTER — Encounter (HOSPITAL_COMMUNITY): Payer: Self-pay | Admitting: Internal Medicine

## 2022-01-19 ENCOUNTER — Inpatient Hospital Stay (HOSPITAL_COMMUNITY): Payer: Medicare Other

## 2022-01-19 ENCOUNTER — Other Ambulatory Visit: Payer: Self-pay

## 2022-01-19 DIAGNOSIS — M1990 Unspecified osteoarthritis, unspecified site: Secondary | ICD-10-CM

## 2022-01-19 DIAGNOSIS — S72141A Displaced intertrochanteric fracture of right femur, initial encounter for closed fracture: Principal | ICD-10-CM

## 2022-01-19 DIAGNOSIS — I10 Essential (primary) hypertension: Secondary | ICD-10-CM

## 2022-01-19 DIAGNOSIS — E039 Hypothyroidism, unspecified: Secondary | ICD-10-CM

## 2022-01-19 HISTORY — PX: INTRAMEDULLARY (IM) NAIL INTERTROCHANTERIC: SHX5875

## 2022-01-19 SURGERY — FIXATION, FRACTURE, INTERTROCHANTERIC, WITH INTRAMEDULLARY ROD
Anesthesia: General | Site: Hip | Laterality: Right

## 2022-01-19 MED ORDER — SODIUM CHLORIDE 0.9 % IV SOLN
2000.0000 mg | INTRAVENOUS | Status: AC
  Filled 2022-01-19: qty 20

## 2022-01-19 MED ORDER — FENTANYL CITRATE (PF) 250 MCG/5ML IJ SOLN
INTRAMUSCULAR | Status: AC
  Filled 2022-01-19: qty 5

## 2022-01-19 MED ORDER — ENOXAPARIN SODIUM 40 MG/0.4ML IJ SOSY: 40.0000 mg | PREFILLED_SYRINGE | Freq: Every day | INTRAMUSCULAR | 0 refills | Status: AC

## 2022-01-19 MED ORDER — TRANEXAMIC ACID-NACL 1000-0.7 MG/100ML-% IV SOLN
1000.0000 mg | Freq: Once | INTRAVENOUS | Status: AC
  Administered 2022-01-19: 1000 mg via INTRAVENOUS
  Filled 2022-01-19: qty 100

## 2022-01-19 MED ORDER — EPHEDRINE SULFATE-NACL 50-0.9 MG/10ML-% IV SOSY
PREFILLED_SYRINGE | INTRAVENOUS | Status: DC | PRN
  Administered 2022-01-19: 5 mg via INTRAVENOUS

## 2022-01-19 MED ORDER — PHENYLEPHRINE HCL-NACL 20-0.9 MG/250ML-% IV SOLN
INTRAVENOUS | Status: DC | PRN
  Administered 2022-01-19: 25 ug/min via INTRAVENOUS

## 2022-01-19 MED ORDER — DOCUSATE SODIUM 100 MG PO CAPS
100.0000 mg | ORAL_CAPSULE | Freq: Two times a day (BID) | ORAL | Status: DC
  Administered 2022-01-19 – 2022-01-22 (×6): 100 mg via ORAL
  Filled 2022-01-19 (×6): qty 1

## 2022-01-19 MED ORDER — AMISULPRIDE (ANTIEMETIC) 5 MG/2ML IV SOLN
5.0000 mg | Freq: Once | INTRAVENOUS | Status: DC | PRN
Start: 2022-01-19 — End: 2022-01-19

## 2022-01-19 MED ORDER — PHENYLEPHRINE 80 MCG/ML (10ML) SYRINGE FOR IV PUSH (FOR BLOOD PRESSURE SUPPORT)
PREFILLED_SYRINGE | INTRAVENOUS | Status: DC | PRN
  Administered 2022-01-19: 80 ug via INTRAVENOUS

## 2022-01-19 MED ORDER — ACETAMINOPHEN 500 MG PO TABS
500.0000 mg | ORAL_TABLET | Freq: Four times a day (QID) | ORAL | Status: AC
  Administered 2022-01-19 – 2022-01-20 (×3): 500 mg via ORAL
  Filled 2022-01-19 (×3): qty 1

## 2022-01-19 MED ORDER — TRANEXAMIC ACID-NACL 1000-0.7 MG/100ML-% IV SOLN
1000.0000 mg | INTRAVENOUS | Status: DC
  Filled 2022-01-19: qty 100

## 2022-01-19 MED ORDER — METHOCARBAMOL 500 MG PO TABS: 500.0000 mg | ORAL_TABLET | Freq: Four times a day (QID) | ORAL | Status: DC | PRN

## 2022-01-19 MED ORDER — ORAL CARE MOUTH RINSE: 15.0000 mL | Freq: Once | OROMUCOSAL | Status: AC

## 2022-01-19 MED ORDER — ENOXAPARIN SODIUM 30 MG/0.3ML IJ SOSY
30.0000 mg | PREFILLED_SYRINGE | INTRAMUSCULAR | Status: DC
  Administered 2022-01-20 – 2022-01-22 (×3): 30 mg via SUBCUTANEOUS
  Filled 2022-01-19 (×3): qty 0.3

## 2022-01-19 MED ORDER — LACTATED RINGERS IV SOLN: INTRAVENOUS | Status: DC

## 2022-01-19 MED ORDER — TRANEXAMIC ACID 1000 MG/10ML IV SOLN
INTRAVENOUS | Status: DC | PRN
  Administered 2022-01-19: 2000 mg via TOPICAL

## 2022-01-19 MED ORDER — HYDRALAZINE HCL 20 MG/ML IJ SOLN
10.0000 mg | Freq: Three times a day (TID) | INTRAMUSCULAR | Status: DC | PRN
  Administered 2022-01-21 – 2022-01-22 (×2): 10 mg via INTRAVENOUS
  Filled 2022-01-19 (×2): qty 1

## 2022-01-19 MED ORDER — CHLORHEXIDINE GLUCONATE 4 % EX LIQD
CUTANEOUS | Status: AC
  Filled 2022-01-19: qty 15

## 2022-01-19 MED ORDER — FENTANYL CITRATE (PF) 250 MCG/5ML IJ SOLN
INTRAMUSCULAR | Status: DC | PRN
  Administered 2022-01-19: 50 ug via INTRAVENOUS

## 2022-01-19 MED ORDER — ONDANSETRON HCL 4 MG/2ML IJ SOLN
INTRAMUSCULAR | Status: DC | PRN
  Administered 2022-01-19: 4 mg via INTRAVENOUS

## 2022-01-19 MED ORDER — CHLORHEXIDINE GLUCONATE 0.12 % MT SOLN
OROMUCOSAL | Status: AC
  Administered 2022-01-19: 15 mL via OROMUCOSAL
  Filled 2022-01-19: qty 15

## 2022-01-19 MED ORDER — HYDROCODONE-ACETAMINOPHEN 5-325 MG PO TABS
1.0000 | ORAL_TABLET | ORAL | Status: DC | PRN
  Administered 2022-01-20 – 2022-01-22 (×4): 1 via ORAL
  Filled 2022-01-19 (×4): qty 1

## 2022-01-19 MED ORDER — PROPOFOL 500 MG/50ML IV EMUL
INTRAVENOUS | Status: DC | PRN
  Administered 2022-01-19: 75 ug/kg/min via INTRAVENOUS

## 2022-01-19 MED ORDER — SODIUM CHLORIDE 0.9 % IV SOLN: INTRAVENOUS | Status: DC

## 2022-01-19 MED ORDER — FENTANYL CITRATE (PF) 100 MCG/2ML IJ SOLN
INTRAMUSCULAR | Status: AC
  Filled 2022-01-19: qty 2

## 2022-01-19 MED ORDER — PROPOFOL 10 MG/ML IV BOLUS
INTRAVENOUS | Status: DC | PRN
  Administered 2022-01-19: 20 mg via INTRAVENOUS
  Administered 2022-01-19: 30 mg via INTRAVENOUS
  Administered 2022-01-19: 80 mg via INTRAVENOUS
  Administered 2022-01-19: 20 mg via INTRAVENOUS

## 2022-01-19 MED ORDER — SUGAMMADEX SODIUM 200 MG/2ML IV SOLN
INTRAVENOUS | Status: DC | PRN
  Administered 2022-01-19: 200 mg via INTRAVENOUS

## 2022-01-19 MED ORDER — ROCURONIUM BROMIDE 10 MG/ML (PF) SYRINGE
PREFILLED_SYRINGE | INTRAVENOUS | Status: DC | PRN
  Administered 2022-01-19: 50 mg via INTRAVENOUS

## 2022-01-19 MED ORDER — HYDROCODONE-ACETAMINOPHEN 5-325 MG PO TABS
1.0000 | ORAL_TABLET | Freq: Four times a day (QID) | ORAL | 0 refills | Status: DC | PRN
Start: 2022-01-19 — End: 2023-05-30

## 2022-01-19 MED ORDER — FENTANYL CITRATE (PF) 100 MCG/2ML IJ SOLN
25.0000 ug | INTRAMUSCULAR | Status: DC | PRN
  Administered 2022-01-19 (×2): 25 ug via INTRAVENOUS

## 2022-01-19 MED ORDER — SORBITOL 70 % SOLN: 30.0000 mL | Freq: Every day | Status: DC | PRN

## 2022-01-19 MED ORDER — MORPHINE SULFATE (PF) 2 MG/ML IV SOLN: 0.5000 mg | INTRAVENOUS | Status: DC | PRN

## 2022-01-19 MED ORDER — POLYETHYLENE GLYCOL 3350 17 G PO PACK: 17.0000 g | PACK | Freq: Every day | ORAL | Status: DC | PRN

## 2022-01-19 MED ORDER — MENTHOL 3 MG MT LOZG
1.0000 | LOZENGE | OROMUCOSAL | Status: DC | PRN
Start: 2022-01-19 — End: 2022-01-22

## 2022-01-19 MED ORDER — ALUM & MAG HYDROXIDE-SIMETH 200-200-20 MG/5ML PO SUSP: 30.0000 mL | ORAL | Status: DC | PRN

## 2022-01-19 MED ORDER — ONDANSETRON HCL 4 MG/2ML IJ SOLN: 4.0000 mg | Freq: Once | INTRAMUSCULAR | Status: DC | PRN

## 2022-01-19 MED ORDER — ACETAMINOPHEN 500 MG PO TABS
1000.0000 mg | ORAL_TABLET | Freq: Once | ORAL | Status: AC
  Administered 2022-01-19: 1000 mg via ORAL
  Filled 2022-01-19: qty 2

## 2022-01-19 MED ORDER — CHLORHEXIDINE GLUCONATE 0.12 % MT SOLN
15.0000 mL | Freq: Once | OROMUCOSAL | Status: AC
Start: 2022-01-19 — End: 2022-01-19

## 2022-01-19 MED ORDER — CEFAZOLIN SODIUM-DEXTROSE 1-4 GM/50ML-% IV SOLN
1.0000 g | Freq: Two times a day (BID) | INTRAVENOUS | Status: AC
  Administered 2022-01-20 (×2): 1 g via INTRAVENOUS
  Filled 2022-01-19: qty 50

## 2022-01-19 MED ORDER — ACETAMINOPHEN 325 MG PO TABS
325.0000 mg | ORAL_TABLET | Freq: Four times a day (QID) | ORAL | Status: DC | PRN
  Administered 2022-01-21: 650 mg via ORAL
  Filled 2022-01-19: qty 2

## 2022-01-19 MED ORDER — LIDOCAINE 2% (20 MG/ML) 5 ML SYRINGE
INTRAMUSCULAR | Status: DC | PRN
  Administered 2022-01-19: 50 mg via INTRAVENOUS

## 2022-01-19 MED ORDER — ONDANSETRON HCL 4 MG PO TABS: 4.0000 mg | ORAL_TABLET | Freq: Four times a day (QID) | ORAL | Status: DC | PRN

## 2022-01-19 MED ORDER — HYDROCODONE-ACETAMINOPHEN 7.5-325 MG PO TABS: 1.0000 | ORAL_TABLET | ORAL | Status: DC | PRN

## 2022-01-19 MED ORDER — METHOCARBAMOL 1000 MG/10ML IJ SOLN: 500.0000 mg | Freq: Four times a day (QID) | INTRAVENOUS | Status: DC | PRN

## 2022-01-19 MED ORDER — PHENOL 1.4 % MT LIQD: 1.0000 | OROMUCOSAL | Status: DC | PRN

## 2022-01-19 MED ORDER — ONDANSETRON HCL 4 MG/2ML IJ SOLN: 4.0000 mg | Freq: Four times a day (QID) | INTRAMUSCULAR | Status: DC | PRN

## 2022-01-19 SURGICAL SUPPLY — 48 items
BAG COUNTER SPONGE SURGICOUNT (BAG) ×2 IMPLANT
BAG SPNG CNTER NS LX DISP (BAG) ×1
BIT DRILL INTERTAN LAG SCREW (BIT) ×1 IMPLANT
BIT DRILL SHORT 4.0 (BIT) IMPLANT
BNDG COHESIVE 4X5 TAN STRL (GAUZE/BANDAGES/DRESSINGS) ×2 IMPLANT
BNDG COHESIVE 6X5 TAN STRL LF (GAUZE/BANDAGES/DRESSINGS) IMPLANT
BNDG GAUZE ELAST 4 BULKY (GAUZE/BANDAGES/DRESSINGS) ×2 IMPLANT
COVER PERINEAL POST (MISCELLANEOUS) ×2 IMPLANT
COVER SURGICAL LIGHT HANDLE (MISCELLANEOUS) ×2 IMPLANT
DRAPE C-ARMOR (DRAPES) ×2 IMPLANT
DRAPE STERI IOBAN 125X83 (DRAPES) ×2 IMPLANT
DRILL BIT SHORT 4.0 (BIT) ×2
DRSG MEPILEX BORDER 4X4 (GAUZE/BANDAGES/DRESSINGS) ×2 IMPLANT
DRSG MEPILEX BORDER 4X8 (GAUZE/BANDAGES/DRESSINGS) ×2 IMPLANT
DRSG PAD ABDOMINAL 8X10 ST (GAUZE/BANDAGES/DRESSINGS) ×4 IMPLANT
DURAPREP 26ML APPLICATOR (WOUND CARE) ×2 IMPLANT
ELECT REM PT RETURN 9FT ADLT (ELECTROSURGICAL) ×2
ELECTRODE REM PT RTRN 9FT ADLT (ELECTROSURGICAL) ×1 IMPLANT
GLOVE BIOGEL PI IND STRL 7.0 (GLOVE) ×1 IMPLANT
GLOVE BIOGEL PI INDICATOR 7.0 (GLOVE) ×1
GLOVE ECLIPSE 7.0 STRL STRAW (GLOVE) ×2 IMPLANT
GLOVE SKINSENSE NS SZ7.5 (GLOVE) ×2
GLOVE SKINSENSE STRL SZ7.5 (GLOVE) ×2 IMPLANT
GLOVE SURG UNDER POLY LF SZ7 (GLOVE) ×38 IMPLANT
GLOVE SURG UNDER POLY LF SZ7.5 (GLOVE) ×8 IMPLANT
GOWN STRL REIN XL XLG (GOWN DISPOSABLE) ×2 IMPLANT
GUIDE PIN 3.2X343 (PIN) ×2
GUIDE PIN 3.2X343MM (PIN) ×4
KIT BASIN OR (CUSTOM PROCEDURE TRAY) ×2 IMPLANT
KIT TURNOVER KIT B (KITS) ×2 IMPLANT
MANIFOLD NEPTUNE II (INSTRUMENTS) ×2 IMPLANT
NAIL TRIGEN RT 10MMX36CM-125 (Nail) ×1 IMPLANT
NS IRRIG 1000ML POUR BTL (IV SOLUTION) ×2 IMPLANT
PACK GENERAL/GYN (CUSTOM PROCEDURE TRAY) ×2 IMPLANT
PAD ARMBOARD 7.5X6 YLW CONV (MISCELLANEOUS) ×4 IMPLANT
PAD CAST 4YDX4 CTTN HI CHSV (CAST SUPPLIES) ×2 IMPLANT
PADDING CAST COTTON 4X4 STRL (CAST SUPPLIES) ×4
PIN GUIDE 3.2X343MM (PIN) IMPLANT
SCREW LAG COMPR KIT 80/75 (Screw) ×1 IMPLANT
SCREW TRIGEN LOW PROF 5.0X37.5 (Screw) ×1 IMPLANT
STAPLER VISISTAT 35W (STAPLE) ×2 IMPLANT
SUT VIC AB 0 CT1 27 (SUTURE) ×2
SUT VIC AB 0 CT1 27XBRD ANBCTR (SUTURE) ×1 IMPLANT
SUT VIC AB 2-0 CT1 27 (SUTURE) ×2
SUT VIC AB 2-0 CT1 TAPERPNT 27 (SUTURE) ×1 IMPLANT
TOWEL GREEN STERILE (TOWEL DISPOSABLE) ×2 IMPLANT
TOWEL GREEN STERILE FF (TOWEL DISPOSABLE) ×2 IMPLANT
WATER STERILE IRR 1000ML POUR (IV SOLUTION) ×2 IMPLANT

## 2022-01-19 NOTE — Anesthesia Preprocedure Evaluation (Addendum)
Anesthesia Evaluation  Patient identified by MRN, date of birth, ID band Patient awake    Reviewed: Allergy & Precautions, NPO status , Patient's Chart, lab work & pertinent test results  Airway Mallampati: II  TM Distance: >3 FB Neck ROM: Full    Dental  (+) Missing   Pulmonary neg pulmonary ROS,    Pulmonary exam normal breath sounds clear to auscultation       Cardiovascular hypertension, Pt. on medications and Pt. on home beta blockers + Peripheral Vascular Disease  Normal cardiovascular exam+ dysrhythmias (h/o PSVT)  Rhythm:Regular Rate:Normal  Normal sinus rhythm Left axis deviation Moderate voltage criteria for LVH, may be normal variant ( R in aVL , Sokolow-Lyon ) Nonspecific ST abnormality Abnormal ECG When compared with ECG of 18-Jan-2022 07:55, PREVIOUS ECG IS PRESENT   Neuro/Psych  Headaches, PSYCHIATRIC DISORDERS Dementia    GI/Hepatic GERD  ,  Endo/Other  Hypothyroidism   Renal/GU Renal InsufficiencyRenal disease  negative genitourinary   Musculoskeletal  (+) Arthritis , Osteoarthritis,    Abdominal   Peds negative pediatric ROS (+)  Hematology  (+) Blood dyscrasia, ,   Anesthesia Other Findings   Reproductive/Obstetrics negative OB ROS                        Anesthesia Physical Anesthesia Plan  ASA: 3  Anesthesia Plan: General   Post-op Pain Management: Tylenol PO (pre-op)*   Induction: Intravenous  PONV Risk Score and Plan: TIVA, Treatment may vary due to age or medical condition, Propofol infusion and Ondansetron  Airway Management Planned: Oral ETT  Additional Equipment:   Intra-op Plan:   Post-operative Plan: Extubation in OR  Informed Consent: I have reviewed the patients History and Physical, chart, labs and discussed the procedure including the risks, benefits and alternatives for the proposed anesthesia with the patient or authorized representative who  has indicated his/her understanding and acceptance.   Patient has DNR.  Discussed DNR with patient and Continue DNR.     Plan Discussed with: Anesthesiologist and CRNA  Anesthesia Plan Comments: (Discussed with daughter [name]. No chest compressions or defibrillation. OK with intubation and IV medication for cardiorespiratory issues that may arise. Will plan for TIVA. Propofol gtt. BIS. NO midazolam. Fentanyl for pain control. Norton Blizzard, MD  )     Anesthesia Quick Evaluation

## 2022-01-19 NOTE — Op Note (Signed)
   Date of Surgery: 01/19/2022  INDICATIONS: Ms. Detore is a 88 y.o.-year-old female who sustained a right hip fracture. The risks and benefits of the procedure discussed with the family prior to the procedure and all questions were answered; consent was obtained.  PREOPERATIVE DIAGNOSIS: right intertrochanteric hip fracture   POSTOPERATIVE DIAGNOSIS: Same   PROCEDURE: Open treatment of intertrochanteric, fracture with intramedullary implant. CPT [id number]   SURGEON: N. Eduard Roux, M.D.   ASSIST: Madalyn Rob, [place]  ANESTHESIA: general   IV FLUIDS AND URINE: See anesthesia record   ESTIMATED BLOOD LOSS: 100 cc  IMPLANTS: [employer] InterTAN 10 x 34, 80/75 compression screws  DRAINS: None.   COMPLICATIONS: see description of procedure.   DESCRIPTION OF PROCEDURE: The patient was brought to the operating room and placed supine on the operating table. The patient's leg had been signed prior to the procedure. The patient had the anesthesia placed by the anesthesiologist. The prep verification and incision time-outs were performed to confirm that this was the correct patient, site, side and location. The patient had an SCD on the opposite lower extremity. The patient did receive antibiotics prior to the incision and was re-dosed during the procedure as needed at indicated intervals. The patient was positioned on the fracture table with the table in traction and internal rotation to reduce the hip. The well leg was placed in a scissor position and all bony prominences were well-padded. The patient had the lower extremity prepped and draped in the standard surgical fashion. The incision was made 4 finger breadths superior to the greater trochanter. A guide pin was inserted into the tip of the greater trochanter under fluoroscopic guidance. An opening reamer was used to gain access to the femoral canal. The nail length was measured and inserted down the femoral canal to its proper  depth. The appropriate version of insertion for the lag screw was found under fluoroscopy. A pin was inserted up the femoral neck through the jig. Then, a second antirotation pin was inserted inferior to the first pin. The length of the lag screw was then measured. The lag screw was inserted as near to center-center in the head as possible. The antirotation pin was then taken out and an interdigitating compression screw was placed in its place. The leg was taken out of traction, then the interdigitating compression screw was used to compress across the fracture. Compression was visualized on serial xrays.  A distal interlocking screw was placed using the perfect circle technique.  The wound was copiously irrigated with saline and the subcutaneous layer closed with 2.0 vicryl and the skin was reapproximated with staples. The wounds were cleaned and dried a final time and a sterile dressing was placed. The hip was taken through a range of motion at the end of the case under fluoroscopic imaging to visualize the approach-withdraw phenomenon and confirm implant length in the head. The patient was then awakened from anesthesia and taken to the recovery room in stable condition. All counts were correct at the end of the case.   Tawanna Cooler was necessary for opening, closing, retracting, limb positioning and overall facilitation and completion of the surgery.  POSTOPERATIVE PLAN: The patient will be weight bearing as tolerated and will return in 2 weeks for staple removal and the patient will receive DVT prophylaxis based on other medications, activity level, and risk ratio of bleeding to thrombosis.   Azucena Cecil, MD [facility] 2:43 PM

## 2022-01-19 NOTE — Progress Notes (Signed)
PHARMACY NOTE:  ANTIMICROBIAL RENAL DOSAGE ADJUSTMENT  Current antimicrobial regimen includes a mismatch between antimicrobial dosage and estimated renal function.  As per policy approved by the Pharmacy & Therapeutics and Medical Executive Committees, the antimicrobial dosage will be adjusted accordingly.  Current antimicrobial dosage:  Cefazolin 2g IV q8 x3  Indication: surgical px  Renal Function:  Estimated Creatinine Clearance: 23.3 mL/min (by C-G formula based on SCr of 0.92 mg/dL). '[]'$      On intermittent HD, scheduled: '[]'$      On CRRT    Antimicrobial dosage has been changed to:  Cefazolin 1g IV q12 x2  Additional comments:   Onnie Boer, PharmD, BCIDP, AAHIVP, CPP Infectious Disease Pharmacist 01/19/2022 4:29 PM

## 2022-01-19 NOTE — TOC CAGE-AID Note (Signed)
Transition of Care [facility]) - CAGE-AID Screening   Patient Details  Name: [name] MRN: [id number] Date of Birth: 10/07/1920  [facility] CM/SW Contact:    Radiance Deady C Tarpley-Carter, LCSWA Phone Number: 01/19/2022, 1:53 PM   Clinical Narrative: Pt participated in [place].  Pt stated she does not use substance or ETOH.  Pt was not offered resources, due to no usage of substance or ETOH.    Cassey Hurrell Tarpley-Carter, MSW, LCSW-A Pronouns:  She/Her/Hers [facility] HealthTransitions of Care Clinical Social Worker Direct Number:  [phone] [email or web address]  CAGE-AID Screening:    Have You Ever Felt You Ought to Cut Down on Your Drinking or Drug Use?: No Have People Annoyed You By [employer] Your Drinking Or Drug Use?: No Have You Felt Bad Or Guilty About Your Drinking Or Drug Use?: No Have You Ever Had a Drink or Used Drugs First Thing In The Morning to Steady Your Nerves or to Get Rid of a Hangover?: No CAGE-AID Score: 0  Substance Abuse Education Offered: No

## 2022-01-19 NOTE — Transfer of Care (Signed)
Immediate Anesthesia Transfer of Care Note  Patient: [name]  Procedure(s) Performed: INTRAMEDULLARY (IM) NAIL INTERTROCHANTRIC (Right: Hip)  Patient Location: PACU  Anesthesia Type:General  Level of Consciousness: drowsy and patient cooperative  Airway & Oxygen Therapy: Patient Spontanous Breathing  Post-op Assessment: Report given to RN and Post -op Vital signs reviewed and stable  Post vital signs: Reviewed and stable  Last Vitals:  Vitals Value Taken Time  BP 176/59 01/19/22 1511  Temp    Pulse 57 01/19/22 1515  Resp 14 01/19/22 1515  SpO2 97 % 01/19/22 1515  Vitals shown include unvalidated device data.  Last Pain:  Vitals:   01/19/22 1150  TempSrc: Oral  PainSc:          Complications: No notable events documented.

## 2022-01-19 NOTE — Plan of Care (Signed)
  Problem: Pain Managment: Goal: General experience of comfort will improve Outcome: Progressing   

## 2022-01-19 NOTE — Hospital Course (Addendum)
88 y.o.f w/ hx of hypertension, hyperlipidemia, hypothyroidism, history of GI bleed depression, and moderate dementia,prior left hip replacement by Dr. Erlinda Hong in 2018  p/w fall. Sometime around 6 AM the patient had gotten up on her own.  She reported that she was trying to make her bed when her hand slipped off the in corner causing her to fall landing onto her right hip.   In ED-afebrile with heart rates 50-68, bp up-233/85, and O2 saturations maintained on room air. Labs significant for WBC 11.4.  X-rays of the pelvis significant for right mild communicated intertrochanteric fracture.Patient was given 0.1 mg of clonidine and metoprolol 50 mg p.o. Orthopedics has been consulted and admitted.S/P IM nail intertrochanteric 01/19/22.  Postop had blood loss anemia transfused 1 unit hemoglobin improved and stabilized.  At this time overall medically stable, continue PT OT at the skilled nursing facility.

## 2022-01-19 NOTE — Progress Notes (Signed)
PROGRESS NOTE [name]  JYN:[id number] DOB: August 11, 1921 DOA: 01/18/2022 PCP: Wenda Low, MD   Brief Narrative/Hospital Course: 88 y.o.f w/ hx of hypertension, hyperlipidemia, hypothyroidism, history of GI bleed depression, and moderate dementia,prior left hip replacement by Dr. Erlinda Hong in 2018  p/w fall. Sometime around 6 AM the patient had gotten up on her own.  She reported that she was trying to make her bed when her hand slipped off the in corner causing her to fall landing onto her right hip.   In ED-afebrile with heart rates 50-68, bp up-233/85, and O2 saturations maintained on room air. Labs significant for WBC 11.4.  X-rays of the pelvis significant for right mild communicated intertrochanteric fracture.  Patient was given 0.1 mg of clonidine and metoprolol 50 mg p.o.. Orthopedics has been consulted and admitted      Subjective: Seen and examined this morning, pleasant alert awake no complaints.  Assessment and Plan: Principal Problem:   Closed intertrochanteric fracture of right femur [facility]) Active Problems:   Fall   Leukocytosis   Hypothyroidism, acquired   Hypertensive urgency   Dementia ([place])   Closed intertrochanteric fracture of right femur secondary to fall: Keep n.p.o. awaiting orthopedic input for possible operative intervention.  Continue pain control and PT OT and DVT prophylaxis as per orthopedics.  Leukocytosis: Without fever, mildly up WBC count.  Monitor Hypothyroidism, acquired:Continue levothyroxine, Hypertensive urgency: Uncontrolled on admission currently improved continue with Cardizem, lisinopril, metoprolol, PRN CLONIDINE. Dementia: Keep on delirium precaution fall precaution perioperatively.  Continue her Remeron  DVT prophylaxis:   Chemical prophylaxis postop per orthopedics Code Status:   Code Status: DNR Family Communication: plan of care discussed with patient at bedside. Patient status is: Inpatient because of ongoing need for operative  intervention Level of care: Telemetry Surgical   Dispo: The patient is from: Home            Anticipated disposition: Skilled nursing facility  Mobility Assessment (last 72 hours)     Mobility Assessment     Row Name 01/19/22 0800 01/18/22 1330         Does patient have an order for bedrest or is patient medically unstable Yes- Bedfast (Level 1) - Complete Yes- Bedfast (Level 1) - Complete      What is the highest level of mobility based on the progressive mobility assessment? Level 1 (Bedfast) - Unable to balance while sitting on edge of bed Level 1 (Bedfast) - Unable to balance while sitting on edge of bed      Is the above level different from baseline mobility prior to current illness? Yes - Recommend PT order Yes - Recommend PT order                Objective: Vitals last 24 hrs: Vitals:   01/18/22 2105 01/19/22 0700 01/19/22 1034 01/19/22 1150  BP: (!) 138/50 (!) 176/58 (!) 181/65 (!) 184/70  Pulse: 61 72 (!) 59 66  Resp: 18 16    Temp:  99.6 F (37.6 C) 97.9 F (36.6 C) 97.7 F (36.5 C)  TempSrc:  Oral Oral Oral  SpO2: (!) 88% 90% 96% 92%  Weight:      Height:       Weight change:   Physical Examination: General exam: alert awake,older than stated age, weak appearing. HEENT:Oral mucosa moist, Ear/Nose WNL grossly, dentition normal. Respiratory system: bilaterally clear BS, no use of accessory muscle Cardiovascular system: S1 & S2 +, No JVD. Gastrointestinal system: Abdomen soft,NT,ND, BS+ Nervous System:Alert,  awake, moving extremities and grossly nonfocal Extremities: LE edema neg,distal peripheral pulses palpable.  Skin: No rashes,no icterus. MSK: Normal muscle bulk,tone, power  Medications reviewed:  Scheduled Meds:  chlorhexidine       [MAR Hold] Chlorhexidine Gluconate Cloth  6 each Topical Q0600   [MAR Hold] colesevelam  1,875 mg Oral BID WC   [MAR Hold] diltiazem  120 mg Oral Daily   [MAR Hold] dorzolamide-timolol  1 drop Left Eye BID   [MAR  Hold] ferrous sulfate  325 mg Oral Q breakfast   [MAR Hold] lactose free nutrition  237 mL Oral Q24H   [MAR Hold] latanoprost  1 drop Both Eyes QHS   [MAR Hold] levothyroxine  25 mcg Oral QODAY   [MAR Hold] levothyroxine  50 mcg Oral QODAY   [MAR Hold] lidocaine   Topical BID   [MAR Hold] lisinopril  20 mg Oral Daily   [MAR Hold] metoprolol tartrate  50 mg Oral BID   [MAR Hold] mirtazapine  15 mg Oral QHS   [MAR Hold] mupirocin ointment  1 application. Nasal BID   [MAR Hold] pantoprazole  40 mg Oral Daily   [MAR Hold] sertraline  100 mg Oral Daily   [MAR Hold] tranexamic acid (CYKLOKAPRON) topical - INTRAOP  2,000 mg Topical On Call to OR   Continuous Infusions:   ceFAZolin (ANCEF) IV     lactated ringers 10 mL/hr at 01/19/22 1214      Diet Order             Diet NPO time specified  Diet effective midnight                            Intake/Output Summary (Last 24 hours) at 01/19/2022 1309 Last data filed at 01/18/2022 1530 Gross per 24 hour  Intake --  Output 100 ml  Net -100 ml   Net IO Since Admission: -100 mL [01/19/22 1309]  Wt Readings from Last 3 Encounters:  01/18/22 49.9 kg  01/20/20 50.1 kg  01/02/20 40.8 kg     Unresulted Labs (From admission, onward)     Start     Ordered   01/20/22 2706  Basic metabolic panel  Daily,   R     Question:  Specimen collection method  Answer:  Lab=Lab collect   01/19/22 0750   01/20/22 0500  CBC  Daily,   R     Question:  Specimen collection method  Answer:  Lab=Lab collect   01/19/22 0750          Data Reviewed: I have personally reviewed following labs and imaging studies CBC: Recent Labs  Lab 01/18/22 0835  WBC 11.4*  HGB 13.8  HCT 41.8  MCV 96.5  PLT 237   Basic Metabolic Panel: Recent Labs  Lab 01/18/22 1006  NA 139  K 4.0  CL 112*  CO2 22  GLUCOSE 102*  BUN 22  CREATININE 0.92  CALCIUM 9.2   GFR: Estimated Creatinine Clearance: 23.3 mL/min (by C-G formula based on SCr of 0.92  mg/dL). Liver Function Tests: No results for input(s): AST, ALT, ALKPHOS, BILITOT, PROT, ALBUMIN in the last 168 hours. No results for input(s): LIPASE, AMYLASE in the last 168 hours. No results for input(s): AMMONIA in the last 168 hours. Coagulation Profile: No results for input(s): INR, PROTIME in the last 168 hours. BNP (last 3 results) No results for input(s): PROBNP in the last 8760 hours. HbA1C: No results for  input(s): HGBA1C in the last 72 hours. CBG: No results for input(s): GLUCAP in the last 168 hours. Lipid Profile: No results for input(s): CHOL, HDL, LDLCALC, TRIG, CHOLHDL, LDLDIRECT in the last 72 hours. Thyroid Function Tests: Recent Labs    01/18/22 1603  TSH 1.892   Sepsis Labs: No results for input(s): PROCALCITON, LATICACIDVEN in the last 168 hours.  Recent Results (from the past 240 hour(s))  Surgical pcr screen     Status: Abnormal   Collection Time: 01/18/22  4:59 PM   Specimen: Nasal Mucosa; Nasal Swab  Result Value Ref Range Status   MRSA, PCR POSITIVE (A) NEGATIVE Final    Comment: CRITICAL RESULT CALLED TO, READ BACK BY AND VERIFIED WITH: RN TERRANA H.  01/18/22 @ 2032 ADC    Staphylococcus aureus POSITIVE (A) NEGATIVE Final    Comment: CRITICAL RESULT CALLED TO, READ BACK BY AND VERIFIED WITH: RN TERRANA H.  01/18/22 @ 2032 ADC (NOTE) The Xpert SA Assay (FDA approved for NASAL specimens in patients 21 years of age and older), is one component of a comprehensive surveillance program. It is not intended to diagnose infection nor to guide or monitor treatment. Performed at [facility], [place] [address], [place], [place] [address]     Antimicrobials: Anti-infectives (From admission, onward)    Start     Dose/Rate Route Frequency Ordered Stop   01/19/22 0600  ceFAZolin (ANCEF) IVPB 2g/100 mL premix        2 g 200 mL/hr over 30 Minutes Intravenous On call to O.R. 01/18/22 1555 01/20/22 0559      Culture/Microbiology Other culture-see  note  Radiology Studies: DG CHEST PORT 1 VIEW  Result Date: 01/18/2022 CLINICAL DATA:  Fall EXAM: PORTABLE CHEST 1 VIEW COMPARISON:  01/18/2020 FINDINGS: Left basilar scarring. Right lung is clear. No pleural effusion or pneumothorax. The heart is normal in size.  Thoracic aortic atherosclerosis. IMPRESSION: No evidence of acute cardiopulmonary disease. Electronically Signed   By: Julian Hy M.D.   On: 01/18/2022 19:09   DG Hip Unilat  With Pelvis 2-3 Views Right  Result Date: 01/18/2022 CLINICAL DATA:  Fall. Right hip pain. Deformity. Foreshortened and rotated. EXAM: DG HIP (WITH OR WITHOUT PELVIS) 2-3V RIGHT COMPARISON:  CT abdomen and pelvis 12/01/2018; frontal view of the bilateral hips 11/28/2016 FINDINGS: There is diffuse decreased bone mineralization. Partial visualization of total left hip arthroplasty without periprosthetic lucency to indicate hardware failure or loosening. Acute right intertrochanteric fracture with mild varus angulation and mild comminution. Mild right femoroacetabular joint space narrowing. The bilateral sacroiliac joint spaces are maintained. IMPRESSION: Acute, mildly angulated, and mildly comminuted right intertrochanteric fracture. Electronically Signed   By: Yvonne Kendall M.D.   On: 01/18/2022 08:28     LOS: 1 day   Antonieta Pert, MD [facility]  01/19/2022, 1:09 PM

## 2022-01-19 NOTE — Anesthesia Procedure Notes (Signed)
Procedure Name: Intubation Date/Time: 01/19/2022 1:53 PM Performed by: Georgia Duff, CRNA Pre-anesthesia Checklist: Patient identified, Emergency Drugs available, Suction available and Patient being monitored Patient Re-evaluated:Patient Re-evaluated prior to induction Oxygen Delivery Method: Circle System Utilized Preoxygenation: Pre-oxygenation with 100% oxygen Induction Type: IV induction Ventilation: Mask ventilation without difficulty Laryngoscope Size: Glidescope and 3 Grade View: Grade I Tube type: Oral Tube size: 6.5 mm Number of attempts: 1 Airway Equipment and Method: Stylet and Oral airway Placement Confirmation: ETT inserted through vocal cords under direct vision, positive ETCO2 and breath sounds checked- equal and bilateral Secured at: 19 cm Tube secured with: Tape Dental Injury: Teeth and Oropharynx as per pre-operative assessment

## 2022-01-19 NOTE — Anesthesia Postprocedure Evaluation (Signed)
Anesthesia Post Note  Patient: [name]  Procedure(s) Performed: INTRAMEDULLARY (IM) NAIL INTERTROCHANTRIC (Right: Hip)     Patient location during evaluation: PACU Anesthesia Type: General Level of consciousness: sedated and patient cooperative Pain management: pain level controlled Vital Signs Assessment: post-procedure vital signs reviewed and stable Respiratory status: spontaneous breathing Cardiovascular status: stable Anesthetic complications: no   No notable events documented.  Last Vitals:  Vitals:   01/19/22 1525 01/19/22 1540  BP: (!) 180/80 (!) 164/55  Pulse: (!) 55 (!) 58  Resp: 12 12  Temp:  36.7 C  SpO2: 98% 96%    Last Pain:  Vitals:   01/19/22 1540  TempSrc:   PainSc: [place]

## 2022-01-19 NOTE — H&P (Signed)

## 2022-01-20 ENCOUNTER — Encounter (HOSPITAL_COMMUNITY): Payer: Self-pay | Admitting: Orthopaedic Surgery

## 2022-01-20 DIAGNOSIS — S72141A Displaced intertrochanteric fracture of right femur, initial encounter for closed fracture: Secondary | ICD-10-CM | POA: Diagnosis not present

## 2022-01-20 LAB — CBC
HCT: 27 % — ABNORMAL LOW (ref 36.0–46.0)
Hemoglobin: 8.5 g/dL — ABNORMAL LOW (ref 12.0–15.0)
MCH: 31.1 pg (ref 26.0–34.0)
MCHC: 31.5 g/dL (ref 30.0–36.0)
MCV: 98.9 fL (ref 80.0–100.0)
Platelets: 125 10*3/uL — ABNORMAL LOW (ref 150–400)
RBC: 2.73 MIL/uL — ABNORMAL LOW (ref 3.87–5.11)
RDW: 13.2 % (ref 11.5–15.5)
WBC: 5.9 10*3/uL (ref 4.0–10.5)
nRBC: 0 % (ref 0.0–0.2)

## 2022-01-20 LAB — IRON AND TIBC
Iron: 35 ug/dL (ref 28–170)
Saturation Ratios: 12 % (ref 10.4–31.8)
TIBC: 288 ug/dL (ref 250–450)
UIBC: 253 ug/dL

## 2022-01-20 LAB — BASIC METABOLIC PANEL
Anion gap: 6 (ref 5–15)
BUN: 25 mg/dL — ABNORMAL HIGH (ref 8–23)
CO2: 23 mmol/L (ref 22–32)
Calcium: 8.6 mg/dL — ABNORMAL LOW (ref 8.9–10.3)
Chloride: 110 mmol/L (ref 98–111)
Creatinine, Ser: 1.09 mg/dL — ABNORMAL HIGH (ref 0.44–1.00)
GFR, Estimated: 46 mL/min — ABNORMAL LOW (ref >60)
Glucose, Bld: 107 mg/dL — ABNORMAL HIGH (ref 70–99)
Potassium: 4.2 mmol/L (ref 3.5–5.1)
Sodium: 139 mmol/L (ref 135–145)

## 2022-01-20 LAB — RETICULOCYTES
Immature Retic Fract: 18.8 % — ABNORMAL HIGH (ref 2.3–15.9)
RBC.: 2.56 MIL/uL — ABNORMAL LOW (ref 3.87–5.11)
Retic Count, Absolute: 75.8 10*3/uL (ref 19.0–186.0)
Retic Ct Pct: 3 % (ref 0.4–3.1)

## 2022-01-20 LAB — FOLATE: Folate: 18.2 ng/mL (ref >5.9)

## 2022-01-20 LAB — FERRITIN: Ferritin: 198 ng/mL (ref 11–307)

## 2022-01-20 LAB — VITAMIN B12: Vitamin B-12: 240 pg/mL (ref 180–914)

## 2022-01-20 MED ORDER — CYANOCOBALAMIN 1000 MCG/ML IJ SOLN
1000.0000 ug | Freq: Every day | INTRAMUSCULAR | Status: AC
  Administered 2022-01-20 – 2022-01-22 (×3): 1000 ug via INTRAMUSCULAR
  Filled 2022-01-20 (×3): qty 1

## 2022-01-20 NOTE — Progress Notes (Signed)
PROGRESS NOTE [name]  BSW:[id number] DOB: 12/09/1920 DOA: 01/18/2022 PCP: Wenda Low, MD   Brief Narrative/Hospital Course: 88 y.o.f w/ hx of hypertension, hyperlipidemia, hypothyroidism, history of GI bleed depression, and moderate dementia,prior left hip replacement by Dr. Erlinda Hong in 2018  p/w fall. Sometime around 6 AM the patient had gotten up on her own.  She reported that she was trying to make her bed when her hand slipped off the in corner causing her to fall landing onto her right hip.   In ED-afebrile with heart rates 50-68, bp up-233/85, and O2 saturations maintained on room air. Labs significant for WBC 11.4.  X-rays of the pelvis significant for right mild communicated intertrochanteric fracture.  Patient was given 0.1 mg of clonidine and metoprolol 50 mg p.o.. Orthopedics has been consulted and admitted    Subjective: Seen examined  No new complaints  Daughter at bedside Overnight afebrile, BP stable Labs with downtrending hemoglobin 8.5 g from 13.8. Pain controlled.  Assessment and Plan: Principal Problem:   Closed intertrochanteric fracture of right femur ([place]) Active Problems:   Fall   Leukocytosis   Hypothyroidism, acquired   Hypertensive urgency   Dementia ([place])   Closed intertrochanteric fracture of right femur secondary to fall: Status post IM nail intertrochanteric 6/1, continue Lovenox SCD for DVT prophylaxis, continue pain control bowel regimen.  PT OT evaluation, anticipate skilled nursing facility placement- back to [facility].    Acute blood loss anemia Anemia of chronic disease: Baseline hemoglobin holding from 8 to 9 g range in 01/2020.  On admission high at 13 likely falsely elevated/hemoconcentrated.  Monitor H&H and transfuse if less than 7 g.  Check anemia panel.  Leukocytosis: Without fever, resolved.   Hypothyroidism, acquired:on levothyroxine, Hypertensive urgency: Uncontrolled on admission currently BP stable continue with patient's  home regimen of Cardizem, lisinopril, metoprolol, PRN CLONIDINE/hydralazine. Dementia: Keep on delirium precaution fall precaution perioperatively.  Continue her Remeron  DVT prophylaxis: enoxaparin (LOVENOX) injection 30 mg Start: 01/20/22 0800 SCDs Start: 01/19/22 1619 Place TED hose Start: 01/19/22 1619  Chemical prophylaxis postop per orthopedics Code Status:   Code Status: DNR Family Communication: plan of care discussed with patient and her daughter at bedside. Patient status is: Inpatient because of ongoing need for operative intervention Level of care: Telemetry Surgical   Dispo: The patient is from: Home            Anticipated disposition: [place] tomorrow pending PTOT, ortho clearance.  Mobility Assessment (last 72 hours)     Mobility Assessment     Row Name 01/19/22 2139 01/19/22 0800 01/18/22 1330       Does patient have an order for bedrest or is patient medically unstable No - Continue assessment Yes- Bedfast (Level 1) - Complete Yes- Bedfast (Level 1) - Complete     What is the highest level of mobility based on the progressive mobility assessment? Level 1 (Bedfast) - Unable to balance while sitting on edge of bed Level 1 (Bedfast) - Unable to balance while sitting on edge of bed Level 1 (Bedfast) - Unable to balance while sitting on edge of bed     Is the above level different from baseline mobility prior to current illness? No - Consider discontinuing PT/OT Yes - Recommend PT order Yes - Recommend PT order               Objective: Vitals last 24 hrs: Vitals:   01/19/22 1650 01/19/22 1750 01/19/22 2139 01/20/22 0636  BP: (!) 183/73 (!) 148/52 (!) 128/54 (!) 145/81  Pulse: 63  61 64  Resp: '14  15 18  '$ Temp:   98.3 F (36.8 C) 97.8 F (36.6 C)  TempSrc:   Oral Oral  SpO2: 96%  100% 100%  Weight:      Height:       Weight change:   Physical Examination: General exam: AA,older than stated age, weak appearing. HEENT:Oral mucosa  moist, Ear/Nose WNL grossly, dentition normal. Respiratory system: bilaterally diminished,no use of accessory muscle Cardiovascular system: S1 & S2 +, No JVD,. Gastrointestinal system: Abdomen soft,NT,ND, BS+ Nervous System:Alert, awake, moving extremities and grossly nonfocal Extremities: rt hip surgical site with post op wound c/d/iedema neg,distal peripheral pulses palpable.  Skin: No rashes,no icterus. MSK: Normal muscle bulk,tone, power   Medications reviewed:  Scheduled Meds:  acetaminophen  500 mg Oral Q6H   Chlorhexidine Gluconate Cloth  6 each Topical Q0600   colesevelam  1,875 mg Oral BID WC   diltiazem  120 mg Oral Daily   docusate sodium  100 mg Oral BID   dorzolamide-timolol  1 drop Left Eye BID   enoxaparin (LOVENOX) injection  30 mg Subcutaneous Q24H   ferrous sulfate  325 mg Oral Q breakfast   lactose free nutrition  237 mL Oral Q24H   latanoprost  1 drop Both Eyes QHS   levothyroxine  25 mcg Oral QODAY   levothyroxine  50 mcg Oral QODAY   lidocaine   Topical BID   lisinopril  20 mg Oral Daily   metoprolol tartrate  50 mg Oral BID   mirtazapine  15 mg Oral QHS   mupirocin ointment  1 application. Nasal BID   pantoprazole  40 mg Oral Daily   sertraline  100 mg Oral Daily   Continuous Infusions:  sodium chloride 75 mL/hr at 01/20/22 0650    ceFAZolin (ANCEF) IV 1 g (01/20/22 0113)   methocarbamol (ROBAXIN) IV        Diet Order             Diet Heart Room service appropriate? Yes; Fluid consistency: Thin  Diet effective now                            Intake/Output Summary (Last 24 hours) at 01/20/2022 2703 Last data filed at 01/20/2022 0802 Gross per 24 hour  Intake 2024 ml  Output 250 ml  Net 1774 ml    Net IO Since Admission: 1,674 mL [01/20/22 0812]  Wt Readings from Last 3 Encounters:  01/18/22 49.9 kg  01/20/20 50.1 kg  01/02/20 40.8 kg     Unresulted Labs (From admission, onward)     Start     Ordered   01/26/22 0500   Creatinine, serum  (enoxaparin (LOVENOX)  CrCl >/= 30 mL/min  )  Weekly,   R     Comments: while on enoxaparin therapy.   Question:  Specimen collection method  Answer:  Lab=Lab collect   01/19/22 1619   01/20/22 5009  Basic metabolic panel  Daily,   R     Question:  Specimen collection method  Answer:  Lab=Lab collect   01/19/22 0750   01/20/22 0500  CBC  Daily,   R     Question:  Specimen collection method  Answer:  Lab=Lab collect   01/19/22 0750          Data Reviewed: I have personally reviewed following labs and imaging  studies CBC: Recent Labs  Lab 01/18/22 0835 01/20/22 0223  WBC 11.4* 5.9  HGB 13.8 8.5*  HCT 41.8 27.0*  MCV 96.5 98.9  PLT 191 125*    Basic Metabolic Panel: Recent Labs  Lab 01/18/22 1006 01/20/22 0223  NA 139 139  K 4.0 4.2  CL 112* 110  CO2 22 23  GLUCOSE 102* 107*  BUN 22 25*  CREATININE 0.92 1.09*  CALCIUM 9.2 8.6*    GFR: Estimated Creatinine Clearance: 19.7 mL/min (A) (by C-G formula based on SCr of 1.09 mg/dL (H)). Liver Function Tests: No results for input(s): AST, ALT, ALKPHOS, BILITOT, PROT, ALBUMIN in the last 168 hours. No results for input(s): LIPASE, AMYLASE in the last 168 hours. No results for input(s): AMMONIA in the last 168 hours. Coagulation Profile: No results for input(s): INR, PROTIME in the last 168 hours. BNP (last 3 results) No results for input(s): PROBNP in the last 8760 hours. HbA1C: No results for input(s): HGBA1C in the last 72 hours. CBG: No results for input(s): GLUCAP in the last 168 hours. Lipid Profile: No results for input(s): CHOL, HDL, LDLCALC, TRIG, CHOLHDL, LDLDIRECT in the last 72 hours. Thyroid Function Tests: Recent Labs    01/18/22 1603  TSH 1.892    Sepsis Labs: No results for input(s): PROCALCITON, LATICACIDVEN in the last 168 hours.  Recent Results (from the past 240 hour(s))  Surgical pcr screen     Status: Abnormal   Collection Time: 01/18/22  4:59 PM   Specimen: Nasal  Mucosa; Nasal Swab  Result Value Ref Range Status   MRSA, PCR POSITIVE (A) NEGATIVE Final    Comment: CRITICAL RESULT CALLED TO, READ BACK BY AND VERIFIED WITH: RN TERRANA H.  01/18/22 @ 2032 ADC    Staphylococcus aureus POSITIVE (A) NEGATIVE Final    Comment: CRITICAL RESULT CALLED TO, READ BACK BY AND VERIFIED WITH: RN TERRANA H.  01/18/22 @ 2032 ADC (NOTE) The Xpert SA Assay (FDA approved for NASAL specimens in patients 57 years of age and older), is one component of a comprehensive surveillance program. It is not intended to diagnose infection nor to guide or monitor treatment. Performed at [facility], [place] [address] [place], [place] [address]      Antimicrobials: Anti-infectives (From admission, onward)    Start     Dose/Rate Route Frequency Ordered Stop   01/20/22 0000  ceFAZolin (ANCEF) IVPB 1 g/50 mL premix        1 g 100 mL/hr over 30 Minutes Intravenous Every 12 hours 01/19/22 1619 01/20/22 2359   01/19/22 0600  ceFAZolin (ANCEF) IVPB 2g/100 mL premix        2 g 200 mL/hr over 30 Minutes Intravenous On call to O.R. 01/18/22 1555 01/19/22 1355      Culture/Microbiology Other culture-see note  Radiology Studies: DG CHEST PORT 1 VIEW  Result Date: 01/18/2022 CLINICAL DATA:  Fall EXAM: PORTABLE CHEST 1 VIEW COMPARISON:  01/18/2020 FINDINGS: Left basilar scarring. Right lung is clear. No pleural effusion or pneumothorax. The heart is normal in size.  Thoracic aortic atherosclerosis. IMPRESSION: No evidence of acute cardiopulmonary disease. Electronically Signed   By: Julian Hy M.D.   On: 01/18/2022 19:09   DG C-Arm 1-60 Min-No Report  Result Date: 01/19/2022 Fluoroscopy was utilized by the requesting physician.  No radiographic interpretation.   DG Hip Unilat  With Pelvis 2-3 Views Right  Result Date: 01/18/2022 CLINICAL DATA:  Fall. Right hip pain. Deformity. Foreshortened and rotated. EXAM:  DG HIP (WITH OR WITHOUT PELVIS) 2-3V RIGHT COMPARISON:  CT  abdomen and pelvis 12/01/2018; frontal view of the bilateral hips 11/28/2016 FINDINGS: There is diffuse decreased bone mineralization. Partial visualization of total left hip arthroplasty without periprosthetic lucency to indicate hardware failure or loosening. Acute right intertrochanteric fracture with mild varus angulation and mild comminution. Mild right femoroacetabular joint space narrowing. The bilateral sacroiliac joint spaces are maintained. IMPRESSION: Acute, mildly angulated, and mildly comminuted right intertrochanteric fracture. Electronically Signed   By: Yvonne Kendall M.D.   On: 01/18/2022 08:28   DG FEMUR, MIN 2 VIEWS RIGHT  Result Date: 01/19/2022 CLINICAL DATA:  Post RIGHT femoral nailing EXAM: RIGHT FEMUR 2 VIEWS COMPARISON:  01/18/2022 Fluoroscopy time: 1 minute 22 seconds Dose: 9.44 mGy Images: 6 FINDINGS: Osseous demineralization. IM nail with 2 compression screws placed across reduced intertrochanteric fracture RIGHT femur. No additional fracture or dislocation seen. IMPRESSION: Post nailing of intertrochanteric fracture RIGHT femur. Electronically Signed   By: Lavonia Dana M.D.   On: 01/19/2022 15:08     LOS: 2 days   Antonieta Pert, MD [facility]  01/20/2022, 8:13 AM

## 2022-01-20 NOTE — Plan of Care (Signed)

## 2022-01-20 NOTE — Evaluation (Addendum)
Occupational Therapy Evaluation Patient Details Name: [name] MRN: [id number] DOB: [birth year] Today's Date: 01/20/2022   History of Present Illness Pt is a 88 y/o female admitted s/p fall resulting in R intertrochanteric fx. Pt underwent IM nailing of R hip on 6/1. PMH: HTN, HLD, hx og GI bleed, depression, dementia.   Clinical Impression   PTA, pt resides at [facility], typically able to complete transfers to/from wheelchair without assist, complete toilting tasks with Modified Independence and receives light assist for bathing/dressing tasks. Pt presents now with deficits in R LE pain (despite premedication), endurance and sitting balance. Pt pleasant and participatory. Pt currently requires Max-Total A x 2 for bed mobility and Mod-Max A for sitting balance EOB. Pt requires Min A for UB ADL and Max A -Total A(+2 if in standing) for LB ADLs due to deficits. Unable to progress OOB today though encouraged chair position and maximove OOB over the weekend pending pain improvements. Recommend DC back to SNF w/ therapy follow-up.     Recommendations for follow up therapy are one component of a multi-disciplinary discharge planning process, led by the attending physician.  Recommendations may be updated based on patient status, additional functional criteria and insurance authorization.   Follow Up Recommendations  Skilled nursing-short term rehab (<3 hours/day)    Assistance Recommended at Discharge Frequent or constant Supervision/Assistance  Patient can return home with the following Two people to help with walking and/or transfers;A lot of help with bathing/dressing/bathroom    Functional Status Assessment  Patient has had a recent decline in their functional status and demonstrates the ability to make significant improvements in function in a reasonable and predictable amount of time.  Equipment Recommendations  None recommended by OT    Recommendations for Other Services        Precautions / Restrictions Precautions Precautions: Fall Restrictions Weight Bearing Restrictions: No      Mobility Bed Mobility Overal bed mobility: Needs Assistance Bed Mobility: Supine to Sit, Sit to Supine     Supine to sit: Max assist, +2 for physical assistance, +2 for safety/equipment, HOB elevated Sit to supine: Total assist, +2 for physical assistance, +2 for safety/equipment   General bed mobility comments: Able to assist advancing LEs some to EOB (L>R) but require significant assist to lift trunk to EOB with posterior lean and step by step cues needed. Total A x 2 to return to supine    Transfers                   General transfer comment: unable today, would need lift OOB      Balance Overall balance assessment: Needs assistance Sitting-balance support: Bilateral upper extremity supported, Feet supported Sitting balance-Leahy Scale: Poor Sitting balance - Comments: initially Max A to maintain sitting balance with heavy posterior lean. pt with baseline L lateral lean though appears more significant today and possibly due to offloading painful R hip. Grossly Mod A to maintain sitting balance EOB with pt able to minimally assist in correcting Postural control: Posterior lean, Left lateral lean                                 ADL either performed or assessed with clinical judgement   ADL Overall ADL's : Needs assistance/impaired Eating/Feeding: Set up;Sitting;Bed level   Grooming: Set up;Sitting;Bed level   Upper Body Bathing: Minimal assistance;Sitting   Lower Body Bathing: Maximal assistance;+2 for physical assistance;+2 for  safety/equipment;Sitting/lateral leans;Sit to/from stand   Upper Body Dressing : Minimal assistance;Sitting   Lower Body Dressing: Maximal assistance;+2 for physical assistance;+2 for safety/equipment;Sitting/lateral leans;Sit to/from stand       Toileting- Water quality scientist and Hygiene: Total assistance;Bed  level               Vision Baseline Vision/History: 1 Wears glasses Ability to See in Adequate Light: 1 Impaired Patient Visual Report: No change from baseline Vision Assessment?: No apparent visual deficits     Perception     Praxis      Pertinent Vitals/Pain Pain Assessment Pain Assessment: Faces Faces Pain Scale: Hurts even more Pain Location: R hip with movement Pain Descriptors / Indicators: Grimacing, Operative site guarding Pain Intervention(s): Premedicated before session, Monitored during session     Hand Dominance Right   Extremity/Trunk Assessment Upper Extremity Assessment Upper Extremity Assessment: Generalized weakness   Lower Extremity Assessment Lower Extremity Assessment: Defer to PT evaluation   Cervical / Trunk Assessment Cervical / Trunk Assessment: Kyphotic   Communication Communication Communication: No difficulties   Cognition Arousal/Alertness: Awake/alert Behavior During Therapy: WFL for tasks assessed/performed Overall Cognitive Status: History of cognitive impairments - at baseline                                 General Comments: hx of dementia, follows one step commands, responds appropriately to humor. hx of being "loopy" from pain meds per daughter     General Comments  Daughter present and supportive. SpO2 briefly desats to 87% on RA, reapplied 2 L O2 at end of session. BP mildly elevated with activity up to 180s, denies dizziness with movement    Exercises     Shoulder Instructions      Home Living Family/patient expects to be discharged to:: Skilled nursing facility                                 Additional Comments: resident at [facility]      Prior Functioning/Environment Prior Level of Function : Needs assist       Physical Assist : ADLs (physical);Mobility (physical) Mobility (physical): Transfers;Gait ADLs (physical): Bathing;Dressing Mobility Comments: typically uses wheelchair  for mobility to/from bathroom and around facility. able to transfer self out of bed to wheelchair. previously worked on mobility with PT at facility ADLs Comments: able to toilet self from w/c level taking w/c to bathroom. light assist for showering and dressing tasks. enjoys doing puzzles at facility        OT Problem List: Decreased strength;Decreased activity tolerance;Impaired balance (sitting and/or standing);Decreased cognition;Decreased knowledge of use of DME or AE;Decreased safety awareness;Pain      OT Treatment/Interventions: Self-care/ADL training;Therapeutic exercise;Energy conservation;DME and/or AE instruction;Therapeutic activities;Patient/family education    OT Goals(Current goals can be found in the care plan section) Acute Rehab OT Goals Patient Stated Goal: decrease pain, get back to how i was OT Goal Formulation: With patient/family Time For Goal Achievement: 02/03/22 Potential to Achieve Goals: Good  OT Frequency: Min 2X/week    Co-evaluation PT/OT/SLP Co-Evaluation/Treatment: Yes Reason for Co-Treatment: To address functional/ADL transfers;For patient/therapist safety   OT goals addressed during session: ADL's and self-care;Strengthening/ROM      AM-PAC OT "6 Clicks" Daily Activity     Outcome Measure Help from another person eating meals?: A Little Help from another person taking care of personal grooming?:  A Little Help from another person toileting, which includes using toliet, bedpan, or urinal?: Total Help from another person bathing (including washing, rinsing, drying)?: A Lot Help from another person to put on and taking off regular upper body clothing?: A Little Help from another person to put on and taking off regular lower body clothing?: A Lot 6 Click Score: 14   End of Session Equipment Utilized During Treatment: Oxygen Nurse Communication: Mobility status;Need for lift equipment  Activity Tolerance: Patient tolerated treatment well;Patient  limited by pain Patient left: in bed;with call bell/phone within reach;with bed alarm set;with family/visitor present  OT Visit Diagnosis: Unsteadiness on feet (R26.81);Other abnormalities of gait and mobility (R26.89);Muscle weakness (generalized) (M62.81);History of falling (Z91.81)                Time: 1583-0940 OT Time Calculation (min): 29 min Charges:  OT General Charges $OT Visit: 1 Visit OT Evaluation $OT Eval Moderate Complexity: 1 Mod  Malachy Chamber, OTR/L Acute Rehab Services Office: [phone]   Layla Maw 01/20/2022, 1:04 PM

## 2022-01-20 NOTE — NC FL2 (Signed)
[place] LEVEL OF CARE SCREENING TOOL     IDENTIFICATION  Patient Name: [name] Birthdate: 1921/03/29 Sex: female Admission Date (Current Location): 01/18/2022  [place] and [place] Number:  Kathleen Argue [id number] [place] and Address:  [facility] [place] [address] [place], [place] [address]      Provider Number: [id number]  Attending Physician Name and Address:  Antonieta Pert, MD  Relative Name and Phone Number:  [name] Daughter  [phone]    Current Level of Care: Hospital Recommended Level of Care: [place] Prior Approval Number:    Date Approved/Denied:   PASRR Number: [id number]  Discharge Plan: SNF    Current Diagnoses: Patient Active Problem List   Diagnosis Date Noted   Closed intertrochanteric fracture of right femur ([place]) 01/18/2022   Fall 01/18/2022   Leukocytosis 01/18/2022   Hypertensive urgency 01/18/2022   Dementia ([place]) 01/18/2022   Acute blood loss anemia 01/19/2020   GI bleed 01/19/2020   Acute GI bleeding 01/18/2020   E. coli UTI 12/04/2018   UTI due to Klebsiella species 12/04/2018   Abnormal magnetic resonance cholangiopancreatography (MRCP) 12/04/2018   Hypophosphatemia 12/04/2018   Hypokalemia 12/04/2018   Proctitis 12/04/2018   Hypomagnesemia 12/04/2018   AKI (acute kidney injury) ([place]) 11/28/2018   Viral gastroenteritis 11/28/2018   Generalized weakness 11/28/2018   Chronic anemia 11/28/2018   Thrombocytopenia (HCC) 11/28/2018   Chronic bilateral low back pain 10/31/2016   Closed subcapital fracture of femur, left, initial encounter ([place]) 10/16/2016   Peripheral vascular disease ([place]) 10/16/2016   Essential hypertension 10/16/2016   Hypothyroidism, acquired 10/16/2016   Dry eye 04/21/2015   H/O surgical procedure 05/25/2014   Glaucoma, 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   Pseudoaphakia 04/10/2014   Acute appendicitis 08/22/2013    Orientation  RESPIRATION BLADDER Height & Weight     Self, Time, Situation  O2 External catheter, Continent Weight: 110 lb (49.9 kg) Height:  '4\' 11"'$  (149.9 cm)  BEHAVIORAL SYMPTOMS/MOOD NEUROLOGICAL BOWEL NUTRITION STATUS      Continent Diet (see discharge summary)  AMBULATORY STATUS COMMUNICATION OF NEEDS Skin   Total Care Verbally Surgical wounds                       Personal Care Assistance Level of Assistance  Bathing, Feeding, Dressing Bathing Assistance: Maximum assistance Feeding assistance: Limited assistance Dressing Assistance: Maximum assistance     Functional Limitations Info  Sight, Hearing, Speech Sight Info: Adequate Hearing Info: Impaired Speech Info: Adequate    SPECIAL CARE FACTORS FREQUENCY  PT (By licensed PT), OT (By licensed OT)     PT Frequency: 5x week OT Frequency: 5x week            Contractures Contractures Info: Not present    Additional Factors Info  Code Status, Allergies Code Status Info: DNR Allergies Info: Codeine, Bentyl (Dicyclomine), Gabapentin, Tramadol, Other           Current Medications (01/20/2022):  This is the current hospital active medication list Current Facility-Administered Medications  Medication Dose Route Frequency Provider Last Rate Last Admin   0.9 %  sodium chloride infusion   Intravenous Continuous Leandrew Koyanagi, MD 75 mL/hr at 01/20/22 0650 New Bag at 01/20/22 0650   acetaminophen (TYLENOL) tablet 325-650 mg  325-650 mg Oral Q6H PRN Leandrew Koyanagi, MD       acetaminophen (TYLENOL) tablet 500 mg  500 mg Oral Q6H Leandrew Koyanagi, MD   500 mg  at 01/20/22 1135   alum & mag hydroxide-simeth (MAALOX/MYLANTA) 200-200-20 MG/5ML suspension 30 mL  30 mL Oral Q4H PRN Leandrew Koyanagi, MD       Chlorhexidine Gluconate Cloth 2 % PADS 6 each  6 each Topical Q0600 Leandrew Koyanagi, MD   6 each at 01/20/22 0649   cloNIDine (CATAPRES) tablet 0.1 mg  0.1 mg Oral Daily PRN Leandrew Koyanagi, MD   0.1 mg at 01/19/22 1654   colesevelam [facility])  tablet 1,875 mg  1,875 mg Oral BID WC Leandrew Koyanagi, MD   1,875 mg at 01/20/22 0839   cyanocobalamin ((VITAMIN B-12)) injection 1,000 mcg  1,000 mcg Intramuscular Q0600 Antonieta Pert, MD   1,000 mcg at 01/20/22 1305   diltiazem (CARDIZEM CD) 24 hr capsule 120 mg  120 mg Oral Daily Leandrew Koyanagi, MD   120 mg at 01/20/22 0839   docusate sodium (COLACE) capsule 100 mg  100 mg Oral BID Leandrew Koyanagi, MD   100 mg at 01/20/22 0839   dorzolamide-timolol (COSOPT) 22.3-6.8 MG/ML ophthalmic solution 1 drop  1 drop Left Eye BID Leandrew Koyanagi, MD   1 drop at 01/20/22 0840   enoxaparin (LOVENOX) injection 30 mg  30 mg Subcutaneous Q24H Leandrew Koyanagi, MD   30 mg at 01/20/22 9563   ferrous sulfate tablet 325 mg  325 mg Oral Q breakfast Leandrew Koyanagi, MD   325 mg at 01/20/22 8756   hydrALAZINE (APRESOLINE) injection 10 mg  10 mg Intravenous Q8H PRN Kc, Maren [place], MD       HYDROcodone-acetaminophen (NORCO) 7.5-325 MG per tablet 1-2 tablet  1-2 tablet Oral Q4H PRN Leandrew Koyanagi, MD       HYDROcodone-acetaminophen (NORCO/VICODIN) 5-325 MG per tablet 1-2 tablet  1-2 tablet Oral Q4H PRN Leandrew Koyanagi, MD   1 tablet at 01/20/22 1131   ipratropium-albuterol (DUONEB) 0.5-2.5 (3) MG/3ML nebulizer solution 3 mL  3 mL Nebulization Q6H PRN Leandrew Koyanagi, MD       lactose free nutrition (Boost) liquid 237 mL  237 mL Oral Q24H Leandrew Koyanagi, MD       latanoprost (XALATAN) 0.005 % ophthalmic solution 1 drop  1 drop Both Eyes QHS Leandrew Koyanagi, MD   1 drop at 01/19/22 2315   levothyroxine (SYNTHROID) tablet 25 mcg  25 mcg Oral Geanie Cooley, MD       levothyroxine (SYNTHROID) tablet 50 mcg  50 mcg Oral Geanie Cooley, MD   50 mcg at 01/20/22 0649   lidocaine (LMX) 4 % cream   Topical BID Leandrew Koyanagi, MD   1 application. at 01/20/22 0841   lisinopril (ZESTRIL) tablet 20 mg  20 mg Oral Daily Leandrew Koyanagi, MD   20 mg at 01/20/22 4332   loperamide (IMODIUM) capsule 2 mg  2 mg Oral Daily PRN Leandrew Koyanagi, MD       LORazepam  (ATIVAN) injection 0.5 mg  0.5 mg Intravenous Q6H PRN Leandrew Koyanagi, MD       meclizine (ANTIVERT) tablet 25 mg  25 mg Oral TID PRN Leandrew Koyanagi, MD       menthol-cetylpyridinium (CEPACOL) lozenge 3 mg  1 lozenge Oral PRN Leandrew Koyanagi, MD       Or   phenol (CHLORASEPTIC) mouth spray 1 spray  1 spray Mouth/Throat PRN Leandrew Koyanagi, MD       methocarbamol (ROBAXIN) tablet 500 mg  500 mg Oral Q6H PRN Leandrew Koyanagi, MD       Or   methocarbamol (ROBAXIN) 500 mg in dextrose 5 % 50 mL IVPB  500 mg Intravenous Q6H PRN Leandrew Koyanagi, MD       metoprolol tartrate (LOPRESSOR) tablet 50 mg  50 mg Oral BID Leandrew Koyanagi, MD   50 mg at 01/20/22 0839   mirtazapine (REMERON) tablet 15 mg  15 mg Oral QHS Leandrew Koyanagi, MD   15 mg at 01/19/22 2238   morphine (PF) 2 MG/ML injection 0.5-1 mg  0.5-1 mg Intravenous Q2H PRN Leandrew Koyanagi, MD       mupirocin ointment (BACTROBAN) 2 % 1 application.  1 application. Nasal BID Leandrew Koyanagi, MD   1 application. at 01/20/22 0839   ondansetron (ZOFRAN) tablet 4 mg  4 mg Oral Q6H PRN Leandrew Koyanagi, MD       Or   ondansetron [facility]) injection 4 mg  4 mg Intravenous Q6H PRN Leandrew Koyanagi, MD       pantoprazole (PROTONIX) EC tablet 40 mg  40 mg Oral Daily Leandrew Koyanagi, MD   40 mg at 01/20/22 0838   polyethylene glycol (MIRALAX / GLYCOLAX) packet 17 g  17 g Oral Daily PRN Leandrew Koyanagi, MD       sertraline (ZOLOFT) tablet 100 mg  100 mg Oral Daily Leandrew Koyanagi, MD   100 mg at 01/20/22 0839   sorbitol 70 % solution 30 mL  30 mL Oral Daily PRN Leandrew Koyanagi, MD         Discharge Medications: Please see discharge summary for a list of discharge medications.  Relevant Imaging Results:  Relevant Lab Results:   Additional Information SN:[id number], pt is vaccinated for covid with at least one booster.  Joanne Chars, LCSW

## 2022-01-20 NOTE — TOC Initial Note (Signed)
Transition of Care [facility]) - Initial/Assessment Note    Patient Details  Name: [name] MRN: [id number] Date of Birth: 1921/04/25  [facility]) CM/SW Contact:    Joanne Chars, LCSW Phone Number: 01/20/2022, 3:49 PM  Clinical Narrative:    CSW met with pt and daughter Eustaquio Maize about DC recommendation for SNF.  Permission given by pt to speak with both daughters, Moshe Cipro.  Per Eustaquio Maize, pt LTC at [facility] ([place] they do want her to do PT/rehab upon her return.  She confirmed they want to return to [place].  Pt is vaccinated for covid with at least one booster.  CSW spoke with [facility], referral sent, they can accept her over the weekend, Sharyn Lull is the contact at [phone].  Per Sharyn Lull, pt [employer] is optum and does not require an authorization.  She just needs DC summary prior to DC.               Expected Discharge Plan: Skilled Nursing Facility Barriers to Discharge: Continued Medical Work up   Patient Goals and CMS Choice Patient states their goals for this hospitalization and ongoing recovery are:: be able to use my wheelchair   Choice offered to / list presented to : Adult Children (daughter Eustaquio Maize)  Expected Discharge Plan and Services Expected Discharge Plan: [place] In-house Referral: Clinical Social Work   Post Acute Care Choice: [place] Living arrangements for the past 2 months: [place] Passenger transport manager)                                      Prior Living Arrangements/Services Living arrangements for the past 2 months: [place] Passenger transport manager) Lives with:: Facility Resident Patient language and need for interpreter reviewed:: Yes Do you feel safe going back to the place where you live?: Yes      Need for Family Participation in Patient Care: Yes (Comment) Care giver support system in place?: Yes (comment) Current home services: Other (comment) (na) Criminal Activity/Legal  Involvement Pertinent to Current Situation/Hospitalization: No - Comment as needed  Activities of Daily Living Home Assistive Devices/Equipment: Wheelchair ADL Screening (condition at time of admission) Patient's cognitive ability adequate to safely complete daily activities?: No Is the patient deaf or have difficulty hearing?: Yes Does the patient have difficulty seeing, even when wearing glasses/contacts?: Yes Does the patient have difficulty concentrating, remembering, or making decisions?: Yes Patient able to express need for assistance with ADLs?: Yes Does the patient have difficulty dressing or bathing?: Yes Independently performs ADLs?: No Communication: Independent, Appropriate for developmental age, Needs assistance Is this a change from baseline?: Pre-admission baseline Dressing (OT): Needs assistance Is this a change from baseline?: Pre-admission baseline Grooming: Needs assistance Is this a change from baseline?: Pre-admission baseline Feeding: Needs assistance Is this a change from baseline?: Pre-admission baseline Bathing: Needs assistance Is this a change from baseline?: Pre-admission baseline Toileting: Needs assistance Is this a change from baseline?: Pre-admission baseline In/Out Bed: Needs assistance Is this a change from baseline?: Pre-admission baseline Walks in Home: Needs assistance Is this a change from baseline?: Pre-admission baseline Does the patient have difficulty walking or climbing stairs?: Yes Weakness of Legs: Both Weakness of Arms/Hands: None  Permission Sought/Granted Permission sought to share information with : Family Supports Permission granted to share information with : Yes, Verbal Permission Granted  Share Information with NAME: daughters Moshe Cipro  Permission granted to share info w AGENCY: [facility]        Emotional Assessment Appearance:: Appears stated age Attitude/Demeanor/Rapport: Engaged Affect (typically observed):  Pleasant Orientation: : Oriented to Self, Oriented to  Time, Oriented to Situation Alcohol / Substance Use: Not Applicable Psych Involvement: No (comment)  Admission diagnosis:  Closed intertrochanteric fracture of right femur ([place]) [S72.141A] Fall, initial encounter [W19.XXXA] Closed fracture of right hip, initial encounter [facility]) [S72.001A] Patient Active Problem List   Diagnosis Date Noted   Closed intertrochanteric fracture of right femur ([place]) 01/18/2022   Fall 01/18/2022   Leukocytosis 01/18/2022   Hypertensive urgency 01/18/2022   Dementia ([place]) 01/18/2022   Acute blood loss anemia 01/19/2020   GI bleed 01/19/2020   Acute GI bleeding 01/18/2020   E. coli UTI 12/04/2018   UTI due to Klebsiella species 12/04/2018   Abnormal magnetic resonance cholangiopancreatography (MRCP) 12/04/2018   Hypophosphatemia 12/04/2018   Hypokalemia 12/04/2018   Proctitis 12/04/2018   Hypomagnesemia 12/04/2018   AKI (acute kidney injury) ([place]) 11/28/2018   Viral gastroenteritis 11/28/2018   Generalized weakness 11/28/2018   Chronic anemia 11/28/2018   Thrombocytopenia ([place]) 11/28/2018   Chronic bilateral low back pain 10/31/2016   Closed subcapital fracture of femur, left, initial encounter ([place]) 10/16/2016   Peripheral vascular disease ([place]) 10/16/2016   Essential hypertension 10/16/2016   Hypothyroidism, acquired 10/16/2016   Dry eye 04/21/2015   H/O surgical procedure 05/25/2014   Glaucoma, 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

## 2022-01-20 NOTE — Progress Notes (Signed)
OT Cancellation Note  Patient Details Name: [name] MRN: [id number] DOB: [birth year]   Cancelled Treatment:    Reason Eval/Treat Not Completed: Pain limiting ability to participate;Patient declined, no reason specified Will follow up for OT eval as schedule permits.   Layla Maw 01/20/2022, 11:25 AM

## 2022-01-20 NOTE — Discharge Instructions (Signed)
° ° °  1. Change dressings as needed °2. May shower but keep incisions covered and dry °3. Take lovenox to prevent blood clots °4. Take stool softeners as needed °5. Take pain meds as needed ° °

## 2022-01-20 NOTE — Progress Notes (Signed)
Subjective: 1 Day Post-Op Procedure(s) (LRB): INTRAMEDULLARY (IM) NAIL INTERTROCHANTRIC (Right) Patient reports pain as mild.  Resting comfortably  Objective: Vital signs in last 24 hours: Temp:  [97.7 F (36.5 C)-98.3 F (36.8 C)] 97.8 F (36.6 C) (06/02 0636) Pulse Rate:  [46-66] 64 (06/02 0636) Resp:  [12-20] 18 (06/02 0636) BP: (128-195)/(52-148) 145/81 (06/02 0636) SpO2:  [92 %-100 %] 100 % (06/02 0636)  Intake/Output from previous day: 06/01 0701 - 06/02 0700 In: 1904 [I.V.:1904] Out: 250 [Urine:250] Intake/Output this shift: No intake/output data recorded.  Recent Labs    01/18/22 0835 01/20/22 0223  HGB 13.8 8.5*   Recent Labs    01/18/22 0835 01/20/22 0223  WBC 11.4* 5.9  RBC 4.33 2.73*  HCT 41.8 27.0*  PLT 191 125*   Recent Labs    01/18/22 1006 01/20/22 0223  NA 139 139  K 4.0 4.2  CL 112* 110  CO2 22 23  BUN 22 25*  CREATININE 0.92 1.09*  GLUCOSE 102* 107*  CALCIUM 9.2 8.6*   No results for input(s): LABPT, INR in the last 72 hours.  Neurovascular intact Sensation intact distally Intact pulses distally Dorsiflexion/Plantar flexion intact Incision: dressing C/D/I No cellulitis present Compartment soft   Assessment/Plan: 1 Day Post-Op Procedure(s) (LRB): INTRAMEDULLARY (IM) NAIL INTERTROCHANTRIC (Right) Up with therapy WBAT RLE ABLA- mild and stable Lovenox/scds for dvt ppx Norco at d/c for pain      Aundra Dubin 01/20/2022, 7:40 AM

## 2022-01-20 NOTE — Evaluation (Signed)
Physical Therapy Evaluation Patient Details Name: [name] MRN: [id number] DOB: 03-01-1921 Today's Date: 01/20/2022  History of Present Illness  Pt is a 88 y/o female admitted 5/31 s/p fall resulting in R intertrochanteric fx. Pt underwent IM nailing of R hip on 6/1. PMH: HTN, HLD, hx og GI bleed, depression, dementia.   Clinical Impression  Pt in bed upon arrival of PT, agreeable to evaluation at this time. Prior to admission the pt was able to transfer independently, but was mobilizing on a wheelchair level at [facility], received assist from staff for ADLs. The pt now presents with limitations in functional mobility, strength, ROM, balance, and activity tolerance due to above dx, and will continue to benefit from skilled PT to address these deficits. The pt required maxA of 2 to complete transition to sitting EOB, mod-maxA to maintain static sitting (pt with L lean at baseline but was increased today compared to baseline), and totalA of 2 to return to supine in bed. Pt with significant limitations due to pain, also presents with poor quad activation in RLE to complete movement against gravity with RLE. Will continue to benefit from skilled PT acutely, recommend SNF rehab after d/c to facilitate return to prior level of independence.         Recommendations for follow up therapy are one component of a multi-disciplinary discharge planning process, led by the attending physician.  Recommendations may be updated based on patient status, additional functional criteria and insurance authorization.  Follow Up Recommendations Skilled nursing-short term rehab (<3 hours/day)    Assistance Recommended at Discharge Frequent or constant Supervision/Assistance  Patient can return home with the following  Two people to help with walking and/or transfers;Two people to help with bathing/dressing/bathroom;Assistance with cooking/housework;Assistance with feeding;Direct supervision/assist for  medications management;Direct supervision/assist for financial management;Assist for transportation;Help with stairs or ramp for entrance    Equipment Recommendations None recommended by PT  Recommendations for Other Services       Functional Status Assessment Patient has had a recent decline in their functional status and demonstrates the ability to make significant improvements in function in a reasonable and predictable amount of time.     Precautions / Restrictions Precautions Precautions: Fall Restrictions Weight Bearing Restrictions: Yes RLE Weight Bearing: Weight bearing as tolerated      Mobility  Bed Mobility Overal bed mobility: Needs Assistance Bed Mobility: Supine to Sit, Sit to Supine     Supine to sit: Max assist, +2 for physical assistance, +2 for safety/equipment, HOB elevated Sit to supine: Total assist, +2 for physical assistance, +2 for safety/equipment   General bed mobility comments: Able to assist advancing LEs some to EOB (L>R) but require significant assist to lift trunk to EOB with posterior lean and step by step cues needed. Total A x 2 to return to supine    Transfers                   General transfer comment: unable today, would need lift OOB        Balance Overall balance assessment: Needs assistance Sitting-balance support: Bilateral upper extremity supported, Feet supported Sitting balance-Leahy Scale: Poor Sitting balance - Comments: initially Max A to maintain sitting balance with heavy posterior lean. pt with baseline L lateral lean though appears more significant today and possibly due to offloading painful R hip. Grossly Mod A to maintain sitting balance EOB with pt able to minimally assist in correcting Postural control: Posterior lean, Left lateral lean  Pertinent Vitals/Pain Pain Assessment Pain Assessment: Faces Faces Pain Scale: Hurts even more Pain Location: R hip with  movement Pain Descriptors / Indicators: Grimacing, Operative site guarding Pain Intervention(s): Premedicated before session, Monitored during session, Limited activity within patient's tolerance, Repositioned    Home Living Family/patient expects to be discharged to:: Skilled nursing facility                   Additional Comments: resident at [facility]    Prior Function Prior Level of Function : Needs assist       Physical Assist : ADLs (physical);Mobility (physical) Mobility (physical): Transfers;Gait ADLs (physical): Bathing;Dressing Mobility Comments: typically uses wheelchair for mobility to/from bathroom and around facility. able to transfer self out of bed to wheelchair. previously worked on mobility with PT at facility ADLs Comments: able to toilet self from w/c level taking w/c to bathroom. light assist for showering and dressing tasks. enjoys doing puzzles at facility     Hand Dominance   Dominant Hand: Right    Extremity/Trunk Assessment   Upper Extremity Assessment Upper Extremity Assessment: Defer to OT evaluation;Generalized weakness    Lower Extremity Assessment Lower Extremity Assessment: Generalized weakness;RLE deficits/detail RLE Deficits / Details: pt with significantly limited ROM in RLE, likely due to pain. unable to tolerate sitting evenly, did allow for AAROM knee flexion and extension while sitting EOB. able to move toes and assist with supine heel slides. unable to move against gravity. reports sensation intact RLE: Unable to fully assess due to pain RLE Sensation: WNL    Cervical / Trunk Assessment Cervical / Trunk Assessment: Kyphotic  Communication   Communication: No difficulties  Cognition Arousal/Alertness: Awake/alert Behavior During Therapy: WFL for tasks assessed/performed Overall Cognitive Status: History of cognitive impairments - at baseline                                 General Comments: hx of dementia,  follows one step commands, responds appropriately to humor. hx of being "loopy" from pain meds per daughter        General Comments General comments (skin integrity, edema, etc.): Daughter present and supportive. SpO2 92% and above on RA while sitting EOB, dropped to low of 87% when returned to supine and pt returned to 2L to return to 90s. BP mildly elevated with activity up to 180s, denies dizziness with movement    Exercises General Exercises - Lower Extremity Long Arc Quad: AAROM, Right, 5 reps, Seated   Assessment/Plan    PT Assessment Patient needs continued PT services  PT Problem List Decreased range of motion;Decreased strength;Decreased activity tolerance;Decreased balance;Decreased mobility;Decreased coordination;Decreased cognition;Decreased safety awareness;Pain       PT Treatment Interventions DME instruction;Gait training;Stair training;Functional mobility training;Therapeutic activities;Therapeutic exercise;Balance training;Cognitive remediation    PT Goals (Current goals can be found in the Care Plan section)  Acute Rehab PT Goals Patient Stated Goal: reduce pain PT Goal Formulation: With patient Time For Goal Achievement: 02/03/22 Potential to Achieve Goals: Good    Frequency Min 3X/week     Co-evaluation PT/OT/SLP Co-Evaluation/Treatment: Yes Reason for Co-Treatment: Complexity of the patient's impairments (multi-system involvement);Necessary to address cognition/behavior during functional activity;For patient/therapist safety;To address functional/ADL transfers PT goals addressed during session: Mobility/safety with mobility;Balance;Strengthening/ROM OT goals addressed during session: ADL's and self-care;Strengthening/ROM       AM-PAC PT "6 Clicks" Mobility  Outcome Measure Help needed turning from your back to your side while in  a flat bed without using bedrails?: Total Help needed moving from lying on your back to sitting on the side of a flat bed  without using bedrails?: Total Help needed moving to and from a bed to a chair (including a wheelchair)?: Total Help needed standing up from a chair using your arms (e.g., wheelchair or bedside chair)?: Total Help needed to walk in hospital room?: Total Help needed climbing 3-5 steps with a railing? : Total 6 Click Score: 6    End of Session Equipment Utilized During Treatment: Oxygen Activity Tolerance: Patient limited by pain Patient left: in bed;with call bell/phone within reach;with bed alarm set;with family/visitor present Nurse Communication: Mobility status;Need for lift equipment PT Visit Diagnosis: Unsteadiness on feet (R26.81);Other abnormalities of gait and mobility (R26.89);Muscle weakness (generalized) (M62.81);Pain Pain - Right/Left: Right Pain - part of body: Hip;Leg    Time: 0263-7858 PT Time Calculation (min) (ACUTE ONLY): 29 min   Charges:   PT Evaluation $PT Eval Moderate Complexity: 1 Mod          [place] Carbo, PT, DPT   Acute Rehabilitation Department Pager #: [phone]  Sandra Cockayne 01/20/2022, 1:31 PM

## 2022-01-21 DIAGNOSIS — S72141A Displaced intertrochanteric fracture of right femur, initial encounter for closed fracture: Secondary | ICD-10-CM | POA: Diagnosis not present

## 2022-01-21 LAB — CBC
HCT: 24.7 % — ABNORMAL LOW (ref 36.0–46.0)
Hemoglobin: 7.9 g/dL — ABNORMAL LOW (ref 12.0–15.0)
MCH: 32.2 pg (ref 26.0–34.0)
MCHC: 32 g/dL (ref 30.0–36.0)
MCV: 100.8 fL — ABNORMAL HIGH (ref 80.0–100.0)
Platelets: 121 10*3/uL — ABNORMAL LOW (ref 150–400)
RBC: 2.45 MIL/uL — ABNORMAL LOW (ref 3.87–5.11)
RDW: 13.3 % (ref 11.5–15.5)
WBC: 5.6 10*3/uL (ref 4.0–10.5)
nRBC: 0 % (ref 0.0–0.2)

## 2022-01-21 LAB — BASIC METABOLIC PANEL
Anion gap: 3 — ABNORMAL LOW (ref 5–15)
BUN: 18 mg/dL (ref 8–23)
CO2: 22 mmol/L (ref 22–32)
Calcium: 8.4 mg/dL — ABNORMAL LOW (ref 8.9–10.3)
Chloride: 115 mmol/L — ABNORMAL HIGH (ref 98–111)
Creatinine, Ser: 0.86 mg/dL (ref 0.44–1.00)
GFR, Estimated: >60 mL/min (ref >60)
Glucose, Bld: 126 mg/dL — ABNORMAL HIGH (ref 70–99)
Potassium: 4.3 mmol/L (ref 3.5–5.1)
Sodium: 140 mmol/L (ref 135–145)

## 2022-01-21 LAB — HEMOGLOBIN AND HEMATOCRIT, BLOOD
HCT: 23.1 % — ABNORMAL LOW (ref 36.0–46.0)
Hemoglobin: 7.5 g/dL — ABNORMAL LOW (ref 12.0–15.0)

## 2022-01-21 LAB — PREPARE RBC (CROSSMATCH)

## 2022-01-21 MED ORDER — SODIUM CHLORIDE 0.9% IV SOLUTION: Freq: Once | INTRAVENOUS | Status: AC

## 2022-01-21 NOTE — Progress Notes (Signed)
Patient sleeping comfortably in bed.   No events. Hgb 7.8 this morning, recommend transfusing with 1 unit RBCs. Up with PT.  Weightbearing: WBAT RLE Insicional and dressing care: Dressings left intact until follow-up Orthopedic device(s): None Showering: ok to shower on POD 5 VTE prophylaxis: Lovenox '40mg'$  qd  4 weeks Pain control: norco Rx in chart Follow - up plan: 2 weeks Contact information:  Erlinda Hong MD, Venida Jarvis, [place]

## 2022-01-21 NOTE — Progress Notes (Addendum)
PROGRESS NOTE [name]  CHE:[id number] DOB: Jan 15, 1921 DOA: 01/18/2022 PCP: Wenda Low, MD   Brief Narrative/Hospital Course: 88 y.o.f w/ hx of hypertension, hyperlipidemia, hypothyroidism, history of GI bleed depression, and moderate dementia,prior left hip replacement by Dr. Erlinda Hong in 2018  p/w fall. Sometime around 6 AM the patient had gotten up on her own.  She reported that she was trying to make her bed when her hand slipped off the in corner causing her to fall landing onto her right hip.   In ED-afebrile with heart rates 50-68, bp up-233/85, and O2 saturations maintained on room air. Labs significant for WBC 11.4.  X-rays of the pelvis significant for right mild communicated intertrochanteric fracture.Patient was given 0.1 mg of clonidine and metoprolol 50 mg p.o. Orthopedics has been consulted and admitted.S/P IM nail intertrochanteric 01/19/22.    Subjective: Seen and examined this morning.  Patient sleepy family at the bedside  Patient reports she is fine she just wants to sleep more.   Labs with downtrending hemoglobin at 7.9 from 8.5 g, on admit 13.8.  Assessment and Plan: Principal Problem:   Closed intertrochanteric fracture of right femur ([place]) Active Problems:   Fall   Leukocytosis   Hypothyroidism, acquired   Hypertensive urgency   Dementia ([place])   Closed intertrochanteric fracture of right femur secondary to fall: Status post IM nail intertrochanteric 6/1,surgically doing well continue w/ Lovenox/ SCD for DVT prophylaxis, continue pain control bowel regimen.  PT OT to continue to work with her with plan for return back to [facility] skilled nursing facility  Acute blood loss anemia Anemia of chronic disease: Baseline hemoglobin holding from 8 to 9 g range in 01/2020.  On admission high at 13 likely falsely elevated/hemoconcentrated.  Hemoglobin further downtrending 7.9 g orthopedic recommending transfusion. Will d/w daughter if agreeable for transfusion.Anemia  panel-with normal iron and ferritin folate B12 on lower end of normal 240- added B12 supplement HH on recheck down to 7.5, spoke w/ [name] her POA, we discussed risks benefits and  she agrees with transfusion as recommended. Recent Labs  Lab 01/18/22 0835 01/20/22 0223 01/21/22 0246  HGB 13.8 8.5* 7.9*  HCT 41.8 27.0* 24.7*    Leukocytosis: Without fever, resolved.   Hypothyroidism, acquired:on levothyroxine, Hypertensive urgency: Uncontrolled on admission currently BP has been fluctuating, continue patient's home regimen of Cardizem, lisinopril, metoprolol, PRN CLONIDINE/hydralazine. Dementia: Keep on delirium precaution fall precaution perioperatively.  She is alert awake communicative interactive pleasant.  Continue her Remeron  DVT prophylaxis: enoxaparin (LOVENOX) injection 30 mg Start: 01/20/22 0800 SCDs Start: 01/19/22 1619 Place TED hose Start: 01/19/22 1619  Chemical prophylaxis postop per orthopedics Code Status:   Code Status: DNR Family Communication: plan of care discussed with patient and her daughter at bedside. Patient status is: Inpatient because of ongoing need for operative intervention Level of care: Telemetry Surgical   Dispo: The patient is from: Home            Anticipated disposition: [facility] tomorrow pending PTOT, ortho clearance.  Mobility Assessment (last 72 hours)     Mobility Assessment     Row Name 01/20/22 1300 01/20/22 1200 01/19/22 2139 01/19/22 0800 01/18/22 1330   Does patient have an order for bedrest or is patient medically unstable -- -- No - Continue assessment Yes- Bedfast (Level 1) - Complete Yes- Bedfast (Level 1) - Complete   What is the highest level of mobility based on the progressive mobility assessment? Level 2 (Chairfast) -  Balance while sitting on edge of bed and cannot stand Level 2 (Chairfast) - Balance while sitting on edge of bed and cannot stand Level 1 (Bedfast) - Unable to balance while sitting on  edge of bed Level 1 (Bedfast) - Unable to balance while sitting on edge of bed Level 1 (Bedfast) - Unable to balance while sitting on edge of bed   Is the above level different from baseline mobility prior to current illness? -- -- No - Consider discontinuing PT/OT Yes - Recommend PT order Yes - Recommend PT order             Objective: Vitals last 24 hrs: Vitals:   01/20/22 2025 01/21/22 0548 01/21/22 0734 01/21/22 0802  BP: (!) 164/72 (!) 180/60 (!) 167/63   Pulse: 88 74 73 72  Resp: '18 15 18 18  '$ Temp: 98.8 F (37.1 C) 99.6 F (37.6 C) 99 F (37.2 C)   TempSrc: Oral Axillary Oral   SpO2: 97% 96% 98% 98%  Weight:      Height:       Weight change:   Physical Examination: General exam: Sleeping, conversant, older than stated age, weak appearing. HEENT:Oral mucosa moist, Ear/Nose WNL grossly, dentition normal. Respiratory system: bilaterally diminished, no use of accessory muscle Cardiovascular system: S1 & S2 +, No JVD,. Gastrointestinal system: Abdomen soft,NT,ND,BS+ Nervous System:moving extremities and grossly nonfocal Extremities: Right hip surgical site with dressing in place Skin: No rashes,no icterus. MSK: Normal muscle bulk,tone, power    Medications reviewed:  Scheduled Meds:  Chlorhexidine Gluconate Cloth  6 each Topical Q0600   colesevelam  1,875 mg Oral BID WC   cyanocobalamin  1,000 mcg Intramuscular Q0600   diltiazem  120 mg Oral Daily   docusate sodium  100 mg Oral BID   dorzolamide-timolol  1 drop Left Eye BID   enoxaparin (LOVENOX) injection  30 mg Subcutaneous Q24H   ferrous sulfate  325 mg Oral Q breakfast   lactose free nutrition  237 mL Oral Q24H   latanoprost  1 drop Both Eyes QHS   levothyroxine  25 mcg Oral QODAY   levothyroxine  50 mcg Oral QODAY   lidocaine   Topical BID   lisinopril  20 mg Oral Daily   metoprolol tartrate  50 mg Oral BID   mirtazapine  15 mg Oral QHS   mupirocin ointment  1 application. Nasal BID   pantoprazole  40  mg Oral Daily   sertraline  100 mg Oral Daily   Continuous Infusions:  methocarbamol (ROBAXIN) IV        Diet Order             Diet Heart Room service appropriate? Yes; Fluid consistency: Thin  Diet effective now                   Intake/Output Summary (Last 24 hours) at 01/21/2022 1021 Last data filed at 01/21/2022 0550 Gross per 24 hour  Intake --  Output 860 ml  Net -860 ml   Net IO Since Admission: 814 mL [01/21/22 1021]  Wt Readings from Last 3 Encounters:  01/18/22 49.9 kg  01/20/20 50.1 kg  01/02/20 40.8 kg     Unresulted Labs (From admission, onward)     Start     Ordered   01/26/22 0500  Creatinine, serum  (enoxaparin (LOVENOX)  CrCl >/= 30 mL/min  )  Weekly,   R     Comments: while on enoxaparin therapy.   Question:  Specimen  collection method  Answer:  Lab=Lab collect   01/19/22 1619   01/21/22 1300  Hemoglobin and hematocrit, blood  Once-Timed,   TIMED       Question:  Specimen collection method  Answer:  Lab=Lab collect   01/21/22 0817   01/20/22 4174  Basic metabolic panel  Daily,   R     Question:  Specimen collection method  Answer:  Lab=Lab collect   01/19/22 0750   01/20/22 0500  CBC  Daily,   R     Question:  Specimen collection method  Answer:  Lab=Lab collect   01/19/22 0750          Data Reviewed: I have personally reviewed following labs and imaging studies CBC: Recent Labs  Lab 01/18/22 0835 01/20/22 0223 01/21/22 0246  WBC 11.4* 5.9 5.6  HGB 13.8 8.5* 7.9*  HCT 41.8 27.0* 24.7*  MCV 96.5 98.9 100.8*  PLT 191 125* 081*   Basic Metabolic Panel: Recent Labs  Lab 01/18/22 1006 01/20/22 0223 01/21/22 0246  NA 139 139 140  K 4.0 4.2 4.3  CL 112* 110 115*  CO2 '22 23 22  '$ GLUCOSE 102* 107* 126*  BUN 22 25* 18  CREATININE 0.92 1.09* 0.86  CALCIUM 9.2 8.6* 8.4*   GFR: Estimated Creatinine Clearance: 24.9 mL/min (by C-G formula based on SCr of 0.86 mg/dL). Liver Function Tests: No results for input(s): AST, ALT, ALKPHOS,  BILITOT, PROT, ALBUMIN in the last 168 hours. No results for input(s): LIPASE, AMYLASE in the last 168 hours. No results for input(s): AMMONIA in the last 168 hours. Coagulation Profile: No results for input(s): INR, PROTIME in the last 168 hours. BNP (last 3 results) No results for input(s): PROBNP in the last 8760 hours. HbA1C: No results for input(s): HGBA1C in the last 72 hours. CBG: No results for input(s): GLUCAP in the last 168 hours. Lipid Profile: No results for input(s): CHOL, HDL, LDLCALC, TRIG, CHOLHDL, LDLDIRECT in the last 72 hours. Thyroid Function Tests: Recent Labs    01/18/22 1603  TSH 1.892   Sepsis Labs: No results for input(s): PROCALCITON, LATICACIDVEN in the last 168 hours.  Recent Results (from the past 240 hour(s))  Surgical pcr screen     Status: Abnormal   Collection Time: 01/18/22  4:59 PM   Specimen: Nasal Mucosa; Nasal Swab  Result Value Ref Range Status   MRSA, PCR POSITIVE (A) NEGATIVE Final    Comment: CRITICAL RESULT CALLED TO, READ BACK BY AND VERIFIED WITH: RN TERRANA H.  01/18/22 @ 2032 ADC    Staphylococcus aureus POSITIVE (A) NEGATIVE Final    Comment: CRITICAL RESULT CALLED TO, READ BACK BY AND VERIFIED WITH: RN TERRANA H.  01/18/22 @ 2032 ADC (NOTE) The Xpert SA Assay (FDA approved for NASAL specimens in patients 84 years of age and older), is one component of a comprehensive surveillance program. It is not intended to diagnose infection nor to guide or monitor treatment. Performed at [facility], [place] [address], [place], [place] [address]      Antimicrobials: Anti-infectives (From admission, onward)    Start     Dose/Rate Route Frequency Ordered Stop   01/20/22 0000  ceFAZolin (ANCEF) IVPB 1 g/50 mL premix        1 g 100 mL/hr over 30 Minutes Intravenous Every 12 hours 01/19/22 1619 01/20/22 1900   01/19/22 0600  ceFAZolin (ANCEF) IVPB 2g/100 mL premix        2 g 200 mL/hr over 30  Minutes Intravenous On call to O.R.  01/18/22 1555 01/19/22 1355      Culture/Microbiology Other culture-see note  Radiology Studies: DG C-Arm 1-60 Min-No Report  Result Date: 01/19/2022 Fluoroscopy was utilized by the requesting physician.  No radiographic interpretation.   DG FEMUR, MIN 2 VIEWS RIGHT  Result Date: 01/19/2022 CLINICAL DATA:  Post RIGHT femoral nailing EXAM: RIGHT FEMUR 2 VIEWS COMPARISON:  01/18/2022 Fluoroscopy time: 1 minute 22 seconds Dose: 9.44 mGy Images: 6 FINDINGS: Osseous demineralization. IM nail with 2 compression screws placed across reduced intertrochanteric fracture RIGHT femur. No additional fracture or dislocation seen. IMPRESSION: Post nailing of intertrochanteric fracture RIGHT femur. Electronically Signed   By: Lavonia Dana M.D.   On: 01/19/2022 15:08     LOS: 3 days   Antonieta Pert, MD [facility]  01/21/2022, 10:21 AM

## 2022-01-22 ENCOUNTER — Inpatient Hospital Stay (HOSPITAL_COMMUNITY): Payer: Medicare Other

## 2022-01-22 DIAGNOSIS — D62 Acute posthemorrhagic anemia: Secondary | ICD-10-CM

## 2022-01-22 DIAGNOSIS — S72141A Displaced intertrochanteric fracture of right femur, initial encounter for closed fracture: Secondary | ICD-10-CM | POA: Diagnosis not present

## 2022-01-22 LAB — CBC
HCT: 26.6 % — ABNORMAL LOW (ref 36.0–46.0)
Hemoglobin: 9.2 g/dL — ABNORMAL LOW (ref 12.0–15.0)
MCH: 33.5 pg (ref 26.0–34.0)
MCHC: 34.6 g/dL (ref 30.0–36.0)
MCV: 96.7 fL (ref 80.0–100.0)
Platelets: 125 10*3/uL — ABNORMAL LOW (ref 150–400)
RBC: 2.75 MIL/uL — ABNORMAL LOW (ref 3.87–5.11)
RDW: 13.9 % (ref 11.5–15.5)
WBC: 5.2 10*3/uL (ref 4.0–10.5)
nRBC: 0 % (ref 0.0–0.2)

## 2022-01-22 LAB — BPAM RBC
Blood Product Expiration Date: 202306212359
ISSUE DATE / TIME: 202306031529
Unit Type and Rh: 6200

## 2022-01-22 LAB — TYPE AND SCREEN
ABO/RH(D): A POS
Antibody Screen: NEGATIVE
Unit division: 0

## 2022-01-22 LAB — BASIC METABOLIC PANEL
Anion gap: 4 — ABNORMAL LOW (ref 5–15)
BUN: 14 mg/dL (ref 8–23)
CO2: 21 mmol/L — ABNORMAL LOW (ref 22–32)
Calcium: 8.5 mg/dL — ABNORMAL LOW (ref 8.9–10.3)
Chloride: 114 mmol/L — ABNORMAL HIGH (ref 98–111)
Creatinine, Ser: 0.94 mg/dL (ref 0.44–1.00)
GFR, Estimated: 55 mL/min — ABNORMAL LOW (ref >60)
Glucose, Bld: 97 mg/dL (ref 70–99)
Potassium: 4 mmol/L (ref 3.5–5.1)
Sodium: 139 mmol/L (ref 135–145)

## 2022-01-22 MED ORDER — POLYETHYLENE GLYCOL 3350 17 G PO PACK: 17.0000 g | PACK | Freq: Every day | ORAL | 0 refills | Status: DC | PRN

## 2022-01-22 NOTE — Progress Notes (Signed)
report [name] place

## 2022-01-22 NOTE — TOC Transition Note (Signed)
Transition of Care [facility]) - CM/SW Discharge Note   Patient Details  Name: [name] MRN: [id number] Date of Birth: [birth year]  [facility]) CM/SW Contact:  Ina Homes, [place] Phone Number: 01/22/2022, 2:09 PM   Clinical Narrative:     Per MD, pt medically ready for d/c today.  SW left message with Sharyn Lull [facility] [phone]) regarding d/c today.   SW spoke with Louie Casa [facility] [phone]) confirmed expecting pt today. Call Report: [phone] Room:1002b  SW updated pt's daughters Eustaquio Maize and Alvis Lemmings called   Final next level of care: [place] Barriers to Discharge: Barriers Resolved   Patient Goals and CMS Choice Patient states their goals for this hospitalization and ongoing recovery are:: be able to use my wheelchair   Choice offered to / list presented to : Adult Children (daughter Eustaquio Maize)  Discharge Placement              Patient chooses bed at: [facility] Patient to be transferred to facility by: [place] Name of family member notified: Eustaquio Maize and [facility] Patient and family notified of of transfer: 01/22/22  Discharge Plan and Services In-house Referral: Clinical Social Work   Post Acute Care Choice: [place]                               Social Determinants of Health (SDOH) Interventions     Readmission Risk Interventions    01/20/2020    1:24 PM  Readmission Risk Prevention Plan  Transportation Screening Complete  PCP or Specialist Appt within 5-7 Days Not Complete  Not Complete comments SNF resident  Home Care Screening Not Complete  Home Care Screening Not Completed Comments SNF resident  Medication Review (RN CM) Referral to Pharmacy

## 2022-01-22 NOTE — Discharge Summary (Signed)
Physician Discharge Summary  [name] HYW:[id number] DOB: Mar 14, 1921 DOA: 01/18/2022  PCP: Wenda Low, MD  Admit date: 01/18/2022 Discharge date: 01/22/2022 Recommendations for Outpatient Follow-up:  Follow up with PCP in 1 weeks-call for appointment Please obtain BMP/CBC in one week Follow-up w/ Dr. Derryl Harbor 2 weeks post op ~ 02/01/22  Discharge Dispo: Skilled nursing facility Discharge Condition: Stable Code Status:   Code Status: DNR Diet recommendation:  Diet Order             Diet - low sodium heart healthy           Diet Heart Room service appropriate? Yes; Fluid consistency: Thin  Diet effective now                 Brief/Interim Summary:88 y.o.f w/ hx of hypertension, hyperlipidemia, hypothyroidism, history of GI bleed depression, and moderate dementia,prior left hip replacement by Dr. Erlinda Hong in 2018  p/w fall. Sometime around 6 AM the patient had gotten up on her own.  She reported that she was trying to make her bed when her hand slipped off the in corner causing her to fall landing onto her right hip.   In ED-afebrile with heart rates 50-68, bp up-233/85, and O2 saturations maintained on room air. Labs significant for WBC 11.4.  X-rays of the pelvis significant for right mild communicated intertrochanteric fracture.Patient was given 0.1 mg of clonidine and metoprolol 50 mg p.o. Orthopedics has been consulted and admitted.S/P IM nail intertrochanteric 01/19/22.  Postop had blood loss anemia transfused 1 unit hemoglobin improved and stabilized.  At this time overall medically stable, continue PT OT at the skilled nursing facility.    Discharge Diagnoses:  Principal Problem:   Closed intertrochanteric fracture of right femur ([place]) Active Problems:   Fall   Leukocytosis   Hypothyroidism, acquired   Hypertensive urgency   Dementia (HCC)   Acute postoperative anemia due to expected blood loss Closed intertrochanteric fracture of right femur secondary to fall: Status post IM  nail intertrochanteric 6/1,surgically doing well continue w/ Lovenox x 14 days for DVT prophylaxis, continue pain control bowel regimen.  PT OT to continue at SNF   Acute postoperative anemia due to expected blood loss Anemia of chronic disease: Baseline hemoglobin holding from 8 to 9 g range in 01/2020.  On admission high at 13 likely falsely elevated/hemoconcentrated.  Hemoglobin further downtrending 7.9 g orthopedic recommending transfusion. Will d/w daughter if agreeable for transfusion.Anemia panel-with normal iron and ferritin folate B12 on lower end of normal 240- added B12 supplement HH on recheck down to 7.5-1 unit PRBC given hemoglobin improved. Recent Labs  Lab 01/18/22 0835 01/20/22 0223 01/21/22 0246 01/21/22 1259 01/22/22 0158  HGB 13.8 8.5* 7.9* 7.5* 9.2*  HCT 41.8 27.0* 24.7* 23.1* 26.6*    Leukocytosis: Without fever, resolved.   Hypothyroidism, acquired:on levothyroxine, Hypertensive urgency: Uncontrolled on admission currently BP has been fluctuating, continue patient's home regimen of Cardizem, lisinopril, metoprolol, PRN CLONIDINE.Per daughter BP runs on higher side at baseline. Dementia: Keep on delirium precaution fall precaution perioperatively.  She is alert awake communicative interactive pleasant.  Continue her Remeron Episode of blood tinged sputum x1 in am,no recurrence.  Patient had ETT for surgery which could be contributing, no recurrence of hemoptysis- chest x-ray done-no significant acute finding.  Discussed with the daughter, she understands patient's need for anticoagulation given high risk for DVT after hip fracture since no recurrence of and significant hemoptysis-she is agreeable to continue on Lovenox DVT prophylaxis and monitoring if  she has any recurrence of hemoptysis, patient will need to seek immediate medical attention.  Consults: Orthopedics Dr Erlinda Hong Subjective: Alert awake oriented resting comfortably no new complaints.  Discharge Exam: Vitals:    01/22/22 0600 01/22/22 0743  BP:  (!) 182/65  Pulse:  92  Resp:  17  Temp:  98.4 F (36.9 C)  SpO2: 94%    General: Pt is alert, awake, not in acute distress Cardiovascular: RRR, S1/S2 +, no rubs, no gallops Respiratory: CTA bilaterally, no wheezing, no rhonchi Abdominal: Soft, NT, ND, bowel sounds + Extremities: no edema, no cyanosis  Discharge Instructions  Discharge Instructions     Diet - low sodium heart healthy   Complete by: As directed    Discharge instructions   Complete by: As directed    Check CBC in 1 week. Follow-up with Dr. Erlinda Hong 2 weeks from surgery. If you notice any kind of blood in the sputum please seek immediate medical attention or call your PCP  Please call call MD or return to ER for similar or worsening recurring problem that brought you to hospital or if any fever,nausea/vomiting,abdominal pain, uncontrolled pain, chest pain,  shortness of breath or any other alarming symptoms.  Please follow-up your doctor as instructed in a week time and call the office for appointment.  Please avoid alcohol, smoking, or any other illicit substance and maintain healthy habits including taking your regular medications as prescribed.  You were cared for by a hospitalist during your hospital stay. If you have any questions about your discharge medications or the care you received while you were in the hospital after you are discharged, you can call the unit and ask to speak with the hospitalist on call if the hospitalist that took care of you is not available.  Once you are discharged, your primary care physician will handle any further medical issues. Please note that NO REFILLS for any discharge medications will be authorized once you are discharged, as it is imperative that you return to your primary care physician (or establish a relationship with a primary care physician if you do not have one) for your aftercare needs so that they can reassess your need for medications  and monitor your lab values   Discharge wound care:   Complete by: As directed    Reinforce dressing until discontinued by Dr. Erlinda Hong   Increase activity slowly   Complete by: As directed    Weight bearing as tolerated   Complete by: As directed       Allergies as of 01/22/2022       Reactions   Codeine Other (See Comments)   "I feel like I'm floating"   Bentyl [dicyclomine]    Gabapentin Other (See Comments)   Cannot be on for a good length of time- causes a lot of lethargy   Tramadol Other (See Comments)   On MAR   Other Other (See Comments)   All pain meds cause lethargy and disorientation        Medication List     STOP taking these medications    brimonidine 0.15 % ophthalmic solution Commonly known as: ALPHAGAN       TAKE these medications    Aspercreme Lidocaine 4 % Crea Generic drug: Lidocaine HCl Apply topically in the morning and at bedtime. To affected area of mid-back   bimatoprost 0.01 % Soln Commonly known as: LUMIGAN Place 1 drop into both eyes at bedtime.   Calcium 600-200 MG-UNIT tablet Take 2 tablets  by mouth daily.   cloNIDine 0.1 MG tablet Commonly known as: CATAPRES Take 0.1 mg by mouth daily as needed (for SBP>180).   diltiazem 120 MG 24 hr capsule Commonly known as: CARDIZEM CD Take 120 mg by mouth daily.   dorzolamide-timolol 22.3-6.8 MG/ML ophthalmic solution Commonly known as: COSOPT Place 1 drop into the left eye 2 (two) times daily.   enoxaparin 40 MG/0.4ML injection Commonly known as: LOVENOX Inject 0.4 mLs (40 mg total) into the skin daily for 14 days.   famotidine 20 MG tablet Commonly known as: PEPCID Take 20 mg by mouth 2 (two) times daily.   ferrous sulfate 325 (65 FE) MG EC tablet Take 325 mg by mouth daily with breakfast.   fexofenadine 60 MG tablet Commonly known as: ALLEGRA Take 60 mg by mouth 2 (two) times daily.   Fish Oil 1000 MG Caps Take 2,000 mg by mouth daily.   HYDROcodone-acetaminophen 5-325 MG  tablet Commonly known as: Norco Take 1 tablet by mouth every 6 (six) hours as needed.   ipratropium-albuterol 0.5-2.5 (3) MG/3ML Soln Commonly known as: DUONEB Inhale 3 mLs into the lungs every 6 (six) hours as needed (for COVID).   lactose free nutrition Liqd Take 237 mLs by mouth daily.   levothyroxine 50 MCG tablet Commonly known as: SYNTHROID Take 50 mcg by mouth every other day.   levothyroxine 25 MCG tablet Commonly known as: SYNTHROID Take 25 mcg by mouth every other day. Alternating with 79mg   lisinopril 20 MG tablet Commonly known as: ZESTRIL Take 20 mg by mouth daily.   loperamide 2 MG tablet Commonly known as: IMODIUM A-D Take 2 mg by mouth daily as needed for diarrhea or loose stools.   meclizine 25 MG tablet Commonly known as: ANTIVERT Take 25 mg by mouth 3 (three) times daily as needed for dizziness.   metoprolol tartrate 50 MG tablet Commonly known as: LOPRESSOR Take 50 mg by mouth 2 (two) times daily.   mirtazapine 15 MG tablet Commonly known as: REMERON Take 15 mg by mouth at bedtime.   nitroGLYCERIN 0.4 MG SL tablet Commonly known as: NITROSTAT Place 1 tablet (0.4 mg total) under the tongue every 5 (five) minutes as needed for chest pain.   omeprazole 20 MG capsule Commonly known as: PRILOSEC Take 20 mg by mouth daily.   pantoprazole 40 MG tablet Commonly known as: PROTONIX Take 1 tablet (40 mg total) by mouth 2 (two) times daily before a meal. Take 2 times daily for 2 weeks, then 1 tablet daily/ What changed:  when to take this additional instructions   polyethylene glycol 17 g packet Commonly known as: MIRALAX / GLYCOLAX Take 17 g by mouth daily as needed for moderate constipation.   PROBIOTIC-10 PO Take 1 capsule by mouth in the morning and at bedtime.   sertraline 100 MG tablet Commonly known as: ZOLOFT Take 100 mg by mouth daily.   Welchol 3.75 g Pack Generic drug: Colesevelam HCl Take 3.75 g by mouth daily.                Discharge Care Instructions  (From admission, onward)           Start     Ordered   01/22/22 0000  Discharge wound care:       Comments: Reinforce dressing until discontinued by Dr. XErlinda Hong  01/22/22 1340   01/19/22 0000  Weight bearing as tolerated        01/19/22 1443  Contact information for follow-up providers     Leandrew Koyanagi, MD. Schedule an appointment as soon as possible for a visit in 2 week(s).   Specialty: Orthopedic Surgery Contact information: [place] [address] [phone]         Wenda Low, MD Follow up in 1 week(s).   Specialty: Internal Medicine Contact information: [address] [place] [address] [phone]              Contact information for after-discharge care     Destination     [facility] .   Service: Skilled Nursing Contact information: [address] [place] [address] [phone]                    Allergies  Allergen Reactions   Codeine Other (See Comments)    "I feel like I'm floating"    Bentyl [Dicyclomine]    Gabapentin Other (See Comments)    Cannot be on for a good length of time- causes a lot of lethargy   Tramadol Other (See Comments)    On MAR   Other Other (See Comments)    All pain meds cause lethargy and disorientation    The results of significant diagnostics from this hospitalization (including imaging, microbiology, ancillary and laboratory) are listed below for reference.    Microbiology: Recent Results (from the past 240 hour(s))  Surgical pcr screen     Status: Abnormal   Collection Time: 01/18/22  4:59 PM   Specimen: Nasal Mucosa; Nasal Swab  Result Value Ref Range Status   MRSA, PCR POSITIVE (A) NEGATIVE Final    Comment: CRITICAL RESULT CALLED TO, READ BACK BY AND VERIFIED WITH: RN TERRANA H.  01/18/22 @ 2032 ADC    Staphylococcus aureus POSITIVE (A) NEGATIVE Final     Comment: CRITICAL RESULT CALLED TO, READ BACK BY AND VERIFIED WITH: RN TERRANA H.  01/18/22 @ 2032 ADC (NOTE) The Xpert SA Assay (FDA approved for NASAL specimens in patients 41 years of age and older), is one component of a comprehensive surveillance program. It is not intended to diagnose infection nor to guide or monitor treatment. Performed at [facility], [place] [address] [place], [place] [address]     Procedures/Studies: DG Chest Port 1 View  Result Date: 01/22/2022 CLINICAL DATA:  Close intertrochanteric fracture right femur. Coughing. EXAM: PORTABLE CHEST 1 VIEW COMPARISON:  None Available. FINDINGS: No pneumothorax. The right lung is clear. Probable small left pleural effusion with underlying atelectasis. The cardiomediastinal silhouette is stable. IMPRESSION: Probable small left pleural effusion with underlying atelectasis. No other changes. Electronically Signed   By: Dorise Bullion III M.D.   On: 01/22/2022 10:09   DG CHEST PORT 1 VIEW  Result Date: 01/18/2022 CLINICAL DATA:  Fall EXAM: PORTABLE CHEST 1 VIEW COMPARISON:  01/18/2020 FINDINGS: Left basilar scarring. Right lung is clear. No pleural effusion or pneumothorax. The heart is normal in size.  Thoracic aortic atherosclerosis. IMPRESSION: No evidence of acute cardiopulmonary disease. Electronically Signed   By: Julian Hy M.D.   On: 01/18/2022 19:09   DG C-Arm 1-60 Min-No Report  Result Date: 01/19/2022 Fluoroscopy was utilized by the requesting physician.  No radiographic interpretation.   DG Hip Unilat  With Pelvis 2-3 Views Right  Result Date: 01/18/2022 CLINICAL DATA:  Fall. Right hip pain. Deformity. Foreshortened and rotated. EXAM: DG HIP (WITH OR WITHOUT PELVIS) 2-3V RIGHT COMPARISON:  CT abdomen  and pelvis 12/01/2018; frontal view of the bilateral hips 11/28/2016 FINDINGS: There is diffuse decreased bone mineralization. Partial visualization of total left hip arthroplasty without periprosthetic lucency  to indicate hardware failure or loosening. Acute right intertrochanteric fracture with mild varus angulation and mild comminution. Mild right femoroacetabular joint space narrowing. The bilateral sacroiliac joint spaces are maintained. IMPRESSION: Acute, mildly angulated, and mildly comminuted right intertrochanteric fracture. Electronically Signed   By: Yvonne Kendall M.D.   On: 01/18/2022 08:28   DG FEMUR, MIN 2 VIEWS RIGHT  Result Date: 01/19/2022 CLINICAL DATA:  Post RIGHT femoral nailing EXAM: RIGHT FEMUR 2 VIEWS COMPARISON:  01/18/2022 Fluoroscopy time: 1 minute 22 seconds Dose: 9.44 mGy Images: 6 FINDINGS: Osseous demineralization. IM nail with 2 compression screws placed across reduced intertrochanteric fracture RIGHT femur. No additional fracture or dislocation seen. IMPRESSION: Post nailing of intertrochanteric fracture RIGHT femur. Electronically Signed   By: Lavonia Dana M.D.   On: 01/19/2022 15:08   Labs: BNP (last 3 results) No results for input(s): BNP in the last 8760 hours. Basic Metabolic Panel: Recent Labs  Lab 01/18/22 1006 01/20/22 0223 01/21/22 0246 01/22/22 0158  NA 139 139 140 139  K 4.0 4.2 4.3 4.0  CL 112* 110 115* 114*  CO2 '22 23 22 '$ 21*  GLUCOSE 102* 107* 126* 97  BUN 22 25* 18 14  CREATININE 0.92 1.09* 0.86 0.94  CALCIUM 9.2 8.6* 8.4* 8.5*   Liver Function Tests: No results for input(s): AST, ALT, ALKPHOS, BILITOT, PROT, ALBUMIN in the last 168 hours. No results for input(s): LIPASE, AMYLASE in the last 168 hours. No results for input(s): AMMONIA in the last 168 hours. CBC: Recent Labs  Lab 01/18/22 0835 01/20/22 0223 01/21/22 0246 01/21/22 1259 01/22/22 0158  WBC 11.4* 5.9 5.6  --  5.2  HGB 13.8 8.5* 7.9* 7.5* 9.2*  HCT 41.8 27.0* 24.7* 23.1* 26.6*  MCV 96.5 98.9 100.8*  --  96.7  PLT 191 125* 121*  --  125*   Cardiac Enzymes: No results for input(s): CKTOTAL, CKMB, CKMBINDEX, TROPONINI in the last 168 hours. BNP: Invalid input(s):  POCBNP CBG: No results for input(s): GLUCAP in the last 168 hours. D-Dimer No results for input(s): DDIMER in the last 72 hours. Hgb A1c No results for input(s): HGBA1C in the last 72 hours. Lipid Profile No results for input(s): CHOL, HDL, LDLCALC, TRIG, CHOLHDL, LDLDIRECT in the last 72 hours. Thyroid function studies No results for input(s): TSH, T4TOTAL, T3FREE, THYROIDAB in the last 72 hours.  Invalid input(s): FREET3 Anemia work up Recent Labs    01/20/22 0223  RETICCTPCT 3.0   Urinalysis    Component Value Date/Time   COLORURINE YELLOW 01/18/2022 [place] 01/18/2022 1710   LABSPEC 1.014 01/18/2022 1710   PHURINE 6.0 01/18/2022 1710   GLUCOSEU NEGATIVE 01/18/2022 1710   HGBUR NEGATIVE 01/18/2022 1710   [place] 01/18/2022 1710   [place] 01/18/2022 1710   PROTEINUR 30 (A) 01/18/2022 1710   UROBILINOGEN 0.2 08/22/2013 0825   NITRITE NEGATIVE 01/18/2022 1710   LEUKOCYTESUR NEGATIVE 01/18/2022 1710   Sepsis Labs Invalid input(s): PROCALCITONIN,  WBC,  LACTICIDVEN Microbiology Recent Results (from the past 240 hour(s))  Surgical pcr screen     Status: Abnormal   Collection Time: 01/18/22  4:59 PM   Specimen: Nasal Mucosa; Nasal Swab  Result Value Ref Range Status   MRSA, PCR POSITIVE (A) NEGATIVE Final    Comment: CRITICAL RESULT CALLED TO, READ BACK BY AND VERIFIED  WITH: RN TERRANA H.  01/18/22 @ 2032 ADC    Staphylococcus aureus POSITIVE (A) NEGATIVE Final    Comment: CRITICAL RESULT CALLED TO, READ BACK BY AND VERIFIED WITH: RN TERRANA H.  01/18/22 @ 2032 ADC (NOTE) The Xpert SA Assay (FDA approved for NASAL specimens in patients 75 years of age and older), is one component of a comprehensive surveillance program. It is not intended to diagnose infection nor to guide or monitor treatment. Performed at [facility], [place] [address], [place], [place] [address]   Time coordinating discharge: 35  minutes  SIGNED: Antonieta Pert, MD  [facility] 01/22/2022, 1:42 PM  If 7PM-7AM, please contact night-coverage [email or web address]

## 2022-01-26 ENCOUNTER — Non-Acute Institutional Stay: Payer: Medicare Other | Admitting: Student

## 2022-01-26 DIAGNOSIS — Z515 Encounter for palliative care: Secondary | ICD-10-CM

## 2022-01-26 DIAGNOSIS — R531 Weakness: Secondary | ICD-10-CM

## 2022-01-26 DIAGNOSIS — K5900 Constipation, unspecified: Secondary | ICD-10-CM

## 2022-01-26 DIAGNOSIS — F0393 Unspecified dementia, unspecified severity, with mood disturbance: Secondary | ICD-10-CM

## 2022-01-26 DIAGNOSIS — S72141D Displaced intertrochanteric fracture of right femur, subsequent encounter for closed fracture with routine healing: Secondary | ICD-10-CM

## 2022-01-26 NOTE — Progress Notes (Signed)
[facility] Consult Note Telephone: [phone]  Fax: [phone]    Date of encounter: 01/26/22 3:51 PM PATIENT NAME: [name] [id number]   [phone] (home)  DOB: Dec 26, 1920 MRN: [id number] PRIMARY CARE PROVIDER:    Wenda Low, MD,  [place] [address] [place] [address] [phone]  REFERRING PROVIDER:   Dr. Ernst Spell, NP  RESPONSIBLE PARTY:    Contact Information     Name Relation Home Work Mobile   Easter,[name] Daughter  [phone]   Maurice Small Daughter   [phone]   Thomos Lemons Relative   [phone]        I met face to face with patient in the facility. Palliative Care was asked to follow this patient by consultation request of  Dr. Shon Hough to address advance care planning and complex medical decision making. This is a follow up visit.                                   ASSESSMENT AND PLAN / RECOMMENDATIONS:   Advance Care Planning/Goals of Care: Goals include to maximize quality of life and symptom management. Patient/health care surrogate gave his/her permission to discuss.  CODE STATUS: DNR  Education provided on Palliative Medicine. Message left for daughter to review goals of care. Will continue to provide ongoing support, symptom management.   Symptom Management/Plan:  Dementia with mood disturbance. Patient requires assistance with adl's. Staff to reorient/redirect as needed. Patient encouraged to use call light.  Monitor for falls, safety. Continue sertraline as directed. Weight has been stable. Will monitor for functional and cognitive declines.   Closed intertrochanteric fracture of right femur- patient s/p surgery on 01/19/22. Patient with generalized weakness. Patient is currently receiving PT/OT at SNF. Her pain is currently managed; continue norco every 6 hours PRN. Weight bearing as  tolerated.   Constipation-encourage adequate fluids, fiber. Miralax 17 gm QD PRN.   Follow up Palliative Care Visit: Palliative care will continue to follow for complex medical decision making, advance care planning, and clarification of goals. Return in 6-8 weeks or prn.  This visit was coded based on medical decision making (MDM).  PPS: 40%  HOSPICE ELIGIBILITY/DIAGNOSIS: TBD  Chief Complaint: Palliative Medicine follow up visit.   HISTORY OF PRESENT ILLNESS:  [name] is a 88 y.o. year old female  with dementia, GERD, chronic lower back pain, hypertension, hypothyroidism, depression. Patient recently hospitalized 5/31-01/22/22 due to fall with closed intertrochanteric fracture of right femur, hypertensive urgency.  Patient resides at [facility] and rehab. Patient received sitting up to w/c.  Her nephew is present. She reports being sore today as she had returned from working with therapy just before lunch. She states her pain has been managed well. She is receiving PT/OT. She does endorse no BM in two days. Endorses good appetite. Sleeping well. OOB daily to w/c. A 10-point review of systems is negative, except for the pertinent positives and negatives detailed in the HPI.  History obtained from review of EMR, discussion with primary team, and interview with family, facility staff/caregiver and/or Ms. Merlin.  I reviewed available labs, medications, imaging, studies and related documents from the EMR.  Records reviewed and summarized above.    Physical Exam:  Constitutional: NAD General: frail appearing, thin EYES: anicteric sclera, lids intact, no discharge  ENMT: intact hearing, oral  mucous membranes moist, dentition intact CV: S1S2, RRR, no LE edema Pulmonary: LCTA, no increased work of breathing, no cough, room air Abdomen: normo-active BS + 4 quadrants, soft and non tender, no ascites GU: deferred MSK: moves all extremities Skin: warm and dry, no rashes or  wounds on visible skin Neuro: generalized weakness, A & O x 2 Psych: non-anxious affect, pleasant Hem/lymph/immuno: no widespread bruising   Thank you for the opportunity to participate in the care of [name].  The palliative care team will continue to follow. Please call our office at [phone] if we can be of additional assistance.   Ezekiel Slocumb, NP   COVID-19 PATIENT SCREENING TOOL Asked and negative response unless otherwise noted:   Have you had symptoms of covid, tested positive or been in contact with someone with symptoms/positive test in the past 5-10 days? No

## 2022-02-01 ENCOUNTER — Ambulatory Visit (INDEPENDENT_AMBULATORY_CARE_PROVIDER_SITE_OTHER): Payer: Medicare Other

## 2022-02-01 ENCOUNTER — Ambulatory Visit (INDEPENDENT_AMBULATORY_CARE_PROVIDER_SITE_OTHER): Payer: Medicare Other | Admitting: Physician Assistant

## 2022-02-01 DIAGNOSIS — S72141D Displaced intertrochanteric fracture of right femur, subsequent encounter for closed fracture with routine healing: Secondary | ICD-10-CM

## 2022-02-01 NOTE — Progress Notes (Signed)
Post-Op Visit Note   Patient: [name]           Date of Birth: 03-31-1921           MRN: [id number] Visit Date: 02/01/2022 PCP: Wenda Low, MD   Assessment & Plan:  Chief Complaint:  Chief Complaint  Patient presents with   Right Hip - Fracture   Visit Diagnoses:  1. Closed displaced intertrochanteric fracture of right femur with routine healing, subsequent encounter     Plan: Patient is a pleasant 88 year old female who comes in today 2 weeks status post right hip IM nail from an intertrochanteric femur fracture, date of surgery 01/19/2022.  She has been doing well.  She was primarily wheelchair-bound prior to surgery but was able to ambulate with transfers.  She has been living in a skilled nursing facility where she has not yet gotten physical therapy.  Examination of the right hip reveals well healed surgical incisions without evidence of infection or cellulitis.  She is neurovascular intact distally.  At this point, staples were removed.  She may be weightbearing as tolerated to the right lower extremity.  She will follow-up with [place] in 4 weeks time for repeat evaluation and x-rays of the right femur.  Call with concerns or questions in meantime.  Follow-Up Instructions: Return in about 4 weeks (around 03/01/2022).   Orders:  Orders Placed This Encounter  Procedures   XR FEMUR, MIN 2 VIEWS RIGHT   No orders of the defined types were placed in this encounter.   Imaging: No results found.  PMFS History: Patient Active Problem List   Diagnosis Date Noted   Acute postoperative anemia due to expected blood loss 01/22/2022   Closed intertrochanteric fracture of right femur ([place]) 01/18/2022   Fall 01/18/2022   Leukocytosis 01/18/2022   Hypertensive urgency 01/18/2022   Dementia ([place]) 01/18/2022   Acute blood loss anemia 01/19/2020   GI bleed 01/19/2020   Acute GI bleeding 01/18/2020   E. coli UTI 12/04/2018   UTI due to Klebsiella species 12/04/2018    Abnormal magnetic resonance cholangiopancreatography (MRCP) 12/04/2018   Hypophosphatemia 12/04/2018   Hypokalemia 12/04/2018   Proctitis 12/04/2018   Hypomagnesemia 12/04/2018   AKI (acute kidney injury) ([place]) 11/28/2018   Viral gastroenteritis 11/28/2018   Generalized weakness 11/28/2018   Chronic anemia 11/28/2018   Thrombocytopenia ([place]) 11/28/2018   Chronic bilateral low back pain 10/31/2016   Closed subcapital fracture of femur, left, initial encounter ([place]) 10/16/2016   Peripheral vascular disease ([place]) 10/16/2016   Essential hypertension 10/16/2016   Hypothyroidism, acquired 10/16/2016   Dry eye 04/21/2015   H/O surgical procedure 05/25/2014   Glaucoma, 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   Pseudoaphakia 04/10/2014   Acute appendicitis 08/22/2013   Past Medical History:  Diagnosis Date   Anemia    Anginal pain ([place])    Arthritis    "all over; doesn't seem to bother me much" (08/22/2013)   Chronic lower back pain    Dysrhythmia    "irregular heart beat"    Exertional shortness of breath    GERD (gastroesophageal reflux disease)    Hypertension    Hypothyroidism    Migraines    "used to"   Mixed hyperlipidemia    Osteoporosis    Paroxysmal supraventricular tachycardia ([place])    Skin cancer    "couple taken off right leg" (08/22/2013)    Family History  Family history unknown: Yes    Past Surgical History:  Procedure Laterality Date   ABDOMINAL HYSTERECTOMY  ANTERIOR APPROACH HEMI HIP ARTHROPLASTY Left 10/18/2016   Procedure: ANTERIOR APPROACH LEFT HEMI HIP ARTHROPLASTY;  Surgeon: Leandrew Koyanagi, MD;  Location: [facility];  Service: Orthopedics;  Laterality: Left;   APPENDECTOMY  08/22/2013   BACK SURGERY     CATARACT EXTRACTION W/ INTRAOCULAR LENS  IMPLANT, BILATERAL     INTRAMEDULLARY (IM) NAIL INTERTROCHANTERIC Right 01/19/2022   Procedure: INTRAMEDULLARY (IM) NAIL INTERTROCHANTRIC;  Surgeon: Leandrew Koyanagi, MD;  Location: [facility];  Service: Orthopedics;  Laterality: Right;    LAPAROSCOPIC APPENDECTOMY N/A 08/22/2013   Procedure: APPENDECTOMY LAPAROSCOPIC;  Surgeon: Imogene Burn. [name], MD;  Location: [facility];  Service: General;  Laterality: N/A;   LUMBAR [facility] SURGERY  2008   THROAT SURGERY  1940's; ?   "cysts removed"    Social History   Occupational History   Not on file  Tobacco Use   Smoking status: Never   Smokeless tobacco: Never  Substance and Sexual Activity   Alcohol use: No   Drug use: No   Sexual activity: Never

## 2022-03-07 ENCOUNTER — Ambulatory Visit (INDEPENDENT_AMBULATORY_CARE_PROVIDER_SITE_OTHER): Payer: Medicare Other | Admitting: Orthopaedic Surgery

## 2022-03-07 ENCOUNTER — Ambulatory Visit (INDEPENDENT_AMBULATORY_CARE_PROVIDER_SITE_OTHER): Payer: Medicare Other

## 2022-03-07 ENCOUNTER — Encounter: Payer: Self-pay | Admitting: Orthopaedic Surgery

## 2022-03-07 DIAGNOSIS — S72141D Displaced intertrochanteric fracture of right femur, subsequent encounter for closed fracture with routine healing: Secondary | ICD-10-CM

## 2022-03-07 NOTE — Progress Notes (Signed)
Post-Op Visit Note   Patient: [name]           Date of Birth: [birth year]           MRN: [id number] Visit Date: 03/07/2022 PCP: Wenda Low, MD   Assessment & Plan:  Chief Complaint:  Chief Complaint  Patient presents with   Right Hip - Follow-up    IM nailing   Visit Diagnoses:  1. Closed displaced intertrochanteric fracture of right femur with routine healing, subsequent encounter     Plan: Patient is a pleasant 88 year old female comes in today 6 weeks status post right hip IM nail from an intertrochanteric femur fracture 01/19/2022.  She has been doing well.  She is standing for transfers and is been taking a few steps.  Prior to surgery, she was nearly wheelchair-bound but able to stand for transfers.  Minimal pain.  Overall, doing well.  Examination of her right hip reveals painless logroll.  She is neurovascular intact distally.  This point, she will continue to work with physical therapy at her SNF.  She will follow-up with [place] in 6 weeks time for repeat evaluation and x-rays of the right femur.  Call with concerns or questions in the meantime.  Follow-Up Instructions: Return in about 6 weeks (around 04/18/2022).   Orders:  Orders Placed This Encounter  Procedures   XR FEMUR, MIN 2 VIEWS RIGHT   No orders of the defined types were placed in this encounter.   Imaging: XR FEMUR, MIN 2 VIEWS RIGHT  Result Date: 03/07/2022 X-rays demonstrate stable fixation and alignment of the intertrochanteric fracture without hardware complication.     PMFS History: Patient Active Problem List   Diagnosis Date Noted   Acute postoperative anemia due to expected blood loss 01/22/2022   Closed intertrochanteric fracture of right femur ([place]) 01/18/2022   Fall 01/18/2022   Leukocytosis 01/18/2022   Hypertensive urgency 01/18/2022   Dementia ([place]) 01/18/2022   Acute blood loss anemia 01/19/2020   GI bleed 01/19/2020   Acute GI bleeding 01/18/2020   E. coli UTI  12/04/2018   UTI due to Klebsiella species 12/04/2018   Abnormal magnetic resonance cholangiopancreatography (MRCP) 12/04/2018   Hypophosphatemia 12/04/2018   Hypokalemia 12/04/2018   Proctitis 12/04/2018   Hypomagnesemia 12/04/2018   AKI (acute kidney injury) ([place]) 11/28/2018   Viral gastroenteritis 11/28/2018   Generalized weakness 11/28/2018   Chronic anemia 11/28/2018   Thrombocytopenia ([place]) 11/28/2018   Chronic bilateral low back pain 10/31/2016   Closed subcapital fracture of femur, left, initial encounter ([place]) 10/16/2016   Peripheral vascular disease ([place]) 10/16/2016   Essential hypertension 10/16/2016   Hypothyroidism, acquired 10/16/2016   Dry eye 04/21/2015   H/O surgical procedure 05/25/2014   Glaucoma, 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   Pseudoaphakia 04/10/2014   Acute appendicitis 08/22/2013   Past Medical History:  Diagnosis Date   Anemia    Anginal pain ([place])    Arthritis    "all over; doesn't seem to bother me much" (08/22/2013)   Chronic lower back pain    Dysrhythmia    "irregular heart beat"    Exertional shortness of breath    GERD (gastroesophageal reflux disease)    Hypertension    Hypothyroidism    Migraines    "used to"   Mixed hyperlipidemia    Osteoporosis    Paroxysmal supraventricular tachycardia ([place])    Skin cancer    "couple taken off right leg" (08/22/2013)    Family History  Family history unknown: Yes  Past Surgical History:  Procedure Laterality Date   ABDOMINAL HYSTERECTOMY     ANTERIOR APPROACH HEMI HIP ARTHROPLASTY Left 10/18/2016   Procedure: ANTERIOR APPROACH LEFT HEMI HIP ARTHROPLASTY;  Surgeon: Leandrew Koyanagi, MD;  Location: [facility];  Service: Orthopedics;  Laterality: Left;   APPENDECTOMY  08/22/2013   BACK SURGERY     CATARACT EXTRACTION W/ INTRAOCULAR LENS  IMPLANT, BILATERAL     INTRAMEDULLARY (IM) NAIL INTERTROCHANTERIC Right 01/19/2022   Procedure: INTRAMEDULLARY (IM) NAIL INTERTROCHANTRIC;  Surgeon: Leandrew Koyanagi, MD;   Location: [facility];  Service: Orthopedics;  Laterality: Right;   LAPAROSCOPIC APPENDECTOMY N/A 08/22/2013   Procedure: APPENDECTOMY LAPAROSCOPIC;  Surgeon: Imogene Burn. [name], MD;  Location: [facility];  Service: General;  Laterality: N/A;   LUMBAR [place] SURGERY  2008   THROAT SURGERY  1940's; ?   "cysts removed"    Social History   Occupational History   Not on file  Tobacco Use   Smoking status: Never   Smokeless tobacco: Never  Substance and Sexual Activity   Alcohol use: No   Drug use: No   Sexual activity: Never

## 2022-04-18 ENCOUNTER — Ambulatory Visit (INDEPENDENT_AMBULATORY_CARE_PROVIDER_SITE_OTHER): Payer: Medicare Other

## 2022-04-18 ENCOUNTER — Encounter: Payer: Self-pay | Admitting: Orthopaedic Surgery

## 2022-04-18 ENCOUNTER — Ambulatory Visit (INDEPENDENT_AMBULATORY_CARE_PROVIDER_SITE_OTHER): Payer: Medicare Other | Admitting: Orthopaedic Surgery

## 2022-04-18 DIAGNOSIS — S72141D Displaced intertrochanteric fracture of right femur, subsequent encounter for closed fracture with routine healing: Secondary | ICD-10-CM | POA: Diagnosis not present

## 2022-04-18 NOTE — Progress Notes (Signed)
Post-Op Visit Note   Patient: [name]           Date of Birth: Nov 26, 1920           MRN: [id number] Visit Date: 04/18/2022 PCP: Marissa Low, MD   Assessment & Plan:  Chief Complaint:  Chief Complaint  Patient presents with   Right Hip - Follow-up    IM nailing of hip fracture 01/19/2022   Visit Diagnoses:  1. Closed displaced intertrochanteric fracture of right femur with routine healing, subsequent encounter     Plan: [name] is 68-monthstatus post IM nailing of a right intertrochanteric hip fracture.  She is doing well overall.  [facility] [place]  She is able to transfer herself and can stand for short period of time.  Examination of the right hip shows full healed surgical scars.  X-rays demonstrate healed intertrochanteric fracture without any hardware complications.  At this point to DPoppyhas made a good recovery.  She will continue to do PT at her facility.  She is recovering as expected.  We will see her back as needed.  Follow-Up Instructions: No follow-ups on file.   Orders:  Orders Placed This Encounter  Procedures   XR FEMUR, MIN 2 VIEWS RIGHT   No orders of the defined types were placed in this encounter.   Imaging: XR FEMUR, MIN 2 VIEWS RIGHT  Result Date: 04/18/2022 Stable fixation alignment of intertrochanteric fracture.  No hardware complications.  Fracture appears to have healed.   PMFS History: Patient Active Problem List   Diagnosis Date Noted   Acute postoperative anemia due to expected blood loss 01/22/2022   Closed intertrochanteric fracture of right femur ([place] 01/18/2022   Fall 01/18/2022   Leukocytosis 01/18/2022   Hypertensive urgency 01/18/2022   Dementia ([place] 01/18/2022   Acute blood loss anemia 01/19/2020   GI bleed 01/19/2020   Acute GI bleeding 01/18/2020   E. coli UTI 12/04/2018   UTI due to Klebsiella species 12/04/2018   Abnormal magnetic resonance  cholangiopancreatography (MRCP) 12/04/2018   Hypophosphatemia 12/04/2018   Hypokalemia 12/04/2018   Proctitis 12/04/2018   Hypomagnesemia 12/04/2018   AKI (acute kidney injury) ([place] 11/28/2018   Viral gastroenteritis 11/28/2018   Generalized weakness 11/28/2018   Chronic anemia 11/28/2018   Thrombocytopenia ([place] 11/28/2018   Chronic bilateral [name] back pain 10/31/2016   Closed subcapital fracture of femur, left, initial encounter ([place] 10/16/2016   Peripheral vascular disease ([place] 10/16/2016   Essential hypertension 10/16/2016   Hypothyroidism, acquired 10/16/2016   Dry eye 04/21/2015   H/O surgical procedure 05/25/2014   Glaucoma, 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   Pseudoaphakia 04/10/2014   Acute appendicitis 08/22/2013   Past Medical History:  Diagnosis Date   Anemia    Anginal pain ([place]    Arthritis    "all over; doesn't seem to bother me much" (08/22/2013)   Chronic lower back pain    Dysrhythmia    "irregular heart beat"    Exertional shortness of breath    GERD (gastroesophageal reflux disease)    Hypertension    Hypothyroidism    Migraines    "used to"   Mixed hyperlipidemia    Osteoporosis    Paroxysmal supraventricular tachycardia ([place]    Skin cancer    "couple taken off right leg" (08/22/2013)    Family History  Family history unknown: Yes    Past Surgical History:  Procedure Laterality Date   ABDOMINAL HYSTERECTOMY  ANTERIOR APPROACH HEMI HIP ARTHROPLASTY Left 10/18/2016   Procedure: ANTERIOR APPROACH LEFT HEMI HIP ARTHROPLASTY;  Surgeon: Leandrew Koyanagi, MD;  Location: [facility];  Service: Orthopedics;  Laterality: Left;   APPENDECTOMY  08/22/2013   BACK SURGERY     CATARACT EXTRACTION W/ INTRAOCULAR LENS  IMPLANT, BILATERAL     INTRAMEDULLARY (IM) NAIL INTERTROCHANTERIC Right 01/19/2022   Procedure: INTRAMEDULLARY (IM) NAIL INTERTROCHANTRIC;  Surgeon: Leandrew Koyanagi, MD;  Location: [facility];  Service: Orthopedics;  Laterality: Right;   LAPAROSCOPIC APPENDECTOMY N/A  08/22/2013   Procedure: APPENDECTOMY LAPAROSCOPIC;  Surgeon: Imogene Burn. [name], MD;  Location: [facility];  Service: General;  Laterality: N/A;   LUMBAR [place] SURGERY  2008   THROAT SURGERY  1940's; ?   "cysts removed"    Social History   Occupational History   Not on file  Tobacco Use   Smoking status: Never   Smokeless tobacco: Never  Substance and Sexual Activity   Alcohol use: No   Drug use: No   Sexual activity: Never

## 2022-06-02 ENCOUNTER — Non-Acute Institutional Stay: Payer: Medicare Other | Admitting: Student

## 2022-06-02 DIAGNOSIS — Z515 Encounter for palliative care: Secondary | ICD-10-CM

## 2022-06-02 DIAGNOSIS — F0393 Unspecified dementia, unspecified severity, with mood disturbance: Secondary | ICD-10-CM

## 2022-06-02 DIAGNOSIS — R630 Anorexia: Secondary | ICD-10-CM

## 2022-06-04 NOTE — Progress Notes (Signed)
[facility] Consult Note Telephone: [phone]  Fax: [phone]    Date of encounter: 06/02/2022 3:38 PM PATIENT NAME: [name] [id number]   [phone] (home)  DOB: 09/04/1920 MRN: [id number] PRIMARY CARE PROVIDER:    Dr. Ernst Spell, NP  REFERRING PROVIDER:   Dr. Ernst Spell, NP  RESPONSIBLE PARTY:    Contact Information     Name Relation Home Work Mobile   [name],[name] Daughter  [phone]   [name] Daughter   [phone]   [name] Relative   [phone]        I met face to face with patient and family in the facility. Palliative Care was asked to follow this patient by consultation request of  Dr. Ernst Spell, NP to address advance care planning and complex medical decision making. This is a follow up visit.                                   ASSESSMENT AND PLAN / RECOMMENDATIONS:   Advance Care Planning/Goals of Care: Goals include to maximize quality of life and symptom management. Patient/health care surrogate gave his/her permission to discuss. CODE STATUS: DNR  Education provided on Palliative Medicine. We discussed hospice services should patient continue to decline.   Symptom Management/Plan:  Dementia with mood disturbance. Patient requires assistance with adl's. Patient with increased forgetfulness. She is also not engaging as much as she had in the past. She is no longer working on puzzles. Staff to reorient/redirect as needed. Daughter reports cognitive decline with patient's roommate passing away. Monitor for falls, safety. Continue sertraline and mirtazapine as directed. Will monitor for functional and cognitive declines.   Appetite-fair appetite overall. Continue mirtazapine QHS. Routine weights per facility. 3 pound weight loss in past 3 months; monitor for weight loss. Encourage  foods patient enjoys; family brings in food.   Follow up Palliative Care Visit: Palliative care will continue to follow for complex medical decision making, advance care planning, and clarification of goals. Return in 4-6 weeks or prn.   This visit was coded based on medical decision making (MDM).  PPS: 40%, weak  HOSPICE ELIGIBILITY/DIAGNOSIS: TBD  Chief Complaint: Palliative Medicine follow up visit.   HISTORY OF PRESENT ILLNESS:  [name] is a 88 y.o. year old female  with dementia, GERD, chronic lower back pain, hypertension, hypothyroidism, depression. Most recent hospitalization 5/31-01/22/22 due to fall with closed intertrochanteric fracture of right femur, hypertensive urgency.   Patient resides at [facility] and rehab. Patient's daughter is at bedside. Patient is not as talkative as usual. Daughter reports patient had cognitive decline when her roommate of 2 years passed away. Patient received IV fluids in the past couple of weeks due to dehydration. Patient treated for UTI with Fosfomycin on 05/22/22. Denies any urinary complaints. Fair appetite endorsed. She is now back at her baseline. She is no longer working on puzzles. Patient answers questions; she denies pain, shortness of breath, nausea or constipation. She is sleeping well. No recent falls reported. She is out of bed daily to w/c.  History obtained from review of EMR, discussion with primary team, and interview with family, facility staff/caregiver and/or Ms. Sherlin.  I reviewed available labs, medications, imaging, studies and related documents from the EMR.  Records reviewed and summarized above.   ROS  Obtained per patient and  daughter. A 10-point review of systems is negative, except for the pertinent positives and negatives detailed in the HPI.   Physical Exam: Weight: 102.4 pounds Pulse 62, resp 16, b/p 112/66, sats 95% on room air Constitutional: NAD General: frail appearing, thin EYES:  anicteric sclera, lids intact, no discharge  ENMT: intact hearing, oral mucous membranes moist, dentition intact CV: S1S2, RRR, no LE edema Pulmonary: LCTA, no increased work of breathing, no cough, room air Abdomen: normo-active BS + 4 quadrants, soft and non tender, no ascites GU: deferred MSK: moves all extremities Skin: warm and dry, no rashes or wounds on visible skin Neuro: +generalized weakness, + cognitive impairment Psych: non-anxious affect, pleasant, A and O x 2 Hem/lymph/immuno: no widespread bruising   Thank you for the opportunity to participate in the care of Ms. Antonson.  The palliative care team will continue to follow. Please call our office at [phone] if we can be of additional assistance.   Ezekiel Slocumb, NP   COVID-19 PATIENT SCREENING TOOL Asked and negative response unless otherwise noted:   Have you had symptoms of covid, tested positive or been in contact with someone with symptoms/positive test in the past 5-10 days? No

## 2022-06-15 ENCOUNTER — Telehealth: Payer: Self-pay | Admitting: Student

## 2022-06-15 NOTE — Telephone Encounter (Signed)
Palliative NP returned call to daughter [name]. Left message. Patient will be seen for f/u visit in the next week.

## 2022-06-16 ENCOUNTER — Non-Acute Institutional Stay: Payer: Medicare Other | Admitting: Student

## 2022-06-16 DIAGNOSIS — R630 Anorexia: Secondary | ICD-10-CM

## 2022-06-16 DIAGNOSIS — R5381 Other malaise: Secondary | ICD-10-CM

## 2022-06-16 DIAGNOSIS — F0393 Unspecified dementia, unspecified severity, with mood disturbance: Secondary | ICD-10-CM

## 2022-06-16 DIAGNOSIS — E860 Dehydration: Secondary | ICD-10-CM

## 2022-06-16 DIAGNOSIS — R531 Weakness: Secondary | ICD-10-CM

## 2022-06-16 DIAGNOSIS — Z515 Encounter for palliative care: Secondary | ICD-10-CM

## 2022-06-16 NOTE — Progress Notes (Unsigned)
[facility] Consult Note Telephone: [phone]  Fax: [phone]    Date of encounter: 06/16/22 4:07 PM PATIENT NAME: [name] [id number]   [phone] (home)  DOB: 23-Sep-1920 MRN: [id number] PRIMARY CARE PROVIDER:    Wenda Low, MD,  [place]. [employer] [address] [place] [address] [phone]  REFERRING PROVIDER:   Wenda Low, MD [place] [employer] [address],  [place] [address] [phone]  RESPONSIBLE PARTY:    Contact Information     Name Relation Home Work [place] Daughter  [phone]   Maurice Small Daughter   [phone]   Thomos Lemons Relative   [phone]        I met face to face with patient and family in *** home/facility. Palliative Care was asked to follow this patient by consultation request of  Wenda Low, MD to address advance care planning and complex medical decision making. This is a follow up visit.                                   ASSESSMENT AND PLAN / RECOMMENDATIONS:   Advance Care Planning/Goals of Care: Goals include to maximize quality of life and symptom management. Patient/health care surrogate gave his/her permission to discuss. Our advance care planning conversation included a discussion about:    The value and importance of advance care planning  Experiences with loved ones who have been seriously ill or have died  Exploration of personal, cultural or spiritual beliefs that might influence medical decisions  Exploration of goals of care in the event of a sudden injury or illness  Identification of a healthcare agent  Review and updating or creation of an  advance directive document . Decision not to resuscitate or to de-escalate disease focused treatments due to poor prognosis. CODE STATUS:  Symptom Management/Plan:    Follow up Palliative Care Visit: Palliative care  will continue to follow for complex medical decision making, advance care planning, and clarification of goals. Return *** weeks or prn.  I spent *** minutes providing this consultation. More than 50% of the time in this consultation was spent in counseling and care coordination.  This visit was coded based on medical decision making (MDM).***  PPS: ***0%  HOSPICE ELIGIBILITY/DIAGNOSIS: TBD  Chief Complaint: ***  HISTORY OF PRESENT ILLNESS:  [name] is a 88 y.o. year old female  with *** .   History obtained from review of EMR, discussion with primary team, and interview with family, facility staff/caregiver and/or [name].  I reviewed available labs, medications, imaging, studies and related documents from the EMR.  Records reviewed and summarized above.   ROS  *** General: NAD EYES: denies vision changes ENMT: denies dysphagia Cardiovascular: denies chest pain, denies DOE Pulmonary: denies cough, denies increased SOB Abdomen: endorses good appetite, denies constipation, endorses continence of bowel GU: denies dysuria, endorses continence of urine MSK:  denies increased weakness,  no falls reported Skin: denies rashes or wounds Neurological: denies pain, denies insomnia Psych: Endorses positive mood Heme/lymph/immuno: denies bruises, abnormal bleeding  Physical Exam: Current and past weights: Constitutional: NAD General: frail appearing, thin/WNWD/obese  EYES: anicteric sclera, lids intact, no discharge  ENMT: intact hearing, oral mucous membranes moist, dentition intact CV: S1S2, RRR, no LE edema Pulmonary: LCTA, no increased work of breathing, no cough, room air Abdomen:  intake 100%, normo-active BS + 4 quadrants, soft and non tender, no ascites GU: deferred MSK: no sarcopenia, moves all extremities, ambulatory Skin: warm and dry, no rashes or wounds on visible skin Neuro:  no generalized weakness,  no cognitive impairment Psych: non-anxious affect, A  and O x 3 Hem/lymph/immuno: no widespread bruising   Thank you for the opportunity to participate in the care of [name].  The palliative care team will continue to follow. Please call our office at [phone] if we can be of additional assistance.   Ezekiel Slocumb, NP   COVID-19 PATIENT SCREENING TOOL Asked and negative response unless otherwise noted:   Have you had symptoms of covid, tested positive or been in contact with someone with symptoms/positive test in the past 5-10 days?

## 2022-10-06 IMAGING — CR DG HIP (WITH OR WITHOUT PELVIS) 2-3V*R*
3 series · 3 of 3 positions shown · non-contrast
Comparison: CT abdomen and pelvis 12/01/2018; frontal view of the
bilateral hips 11/28/2016

CLINICAL DATA: Fall. Right hip pain. Deformity. Foreshortened and
rotated.

EXAM:
DG HIP (WITH OR WITHOUT PELVIS) 2-3V RIGHT

[pelvis ap]
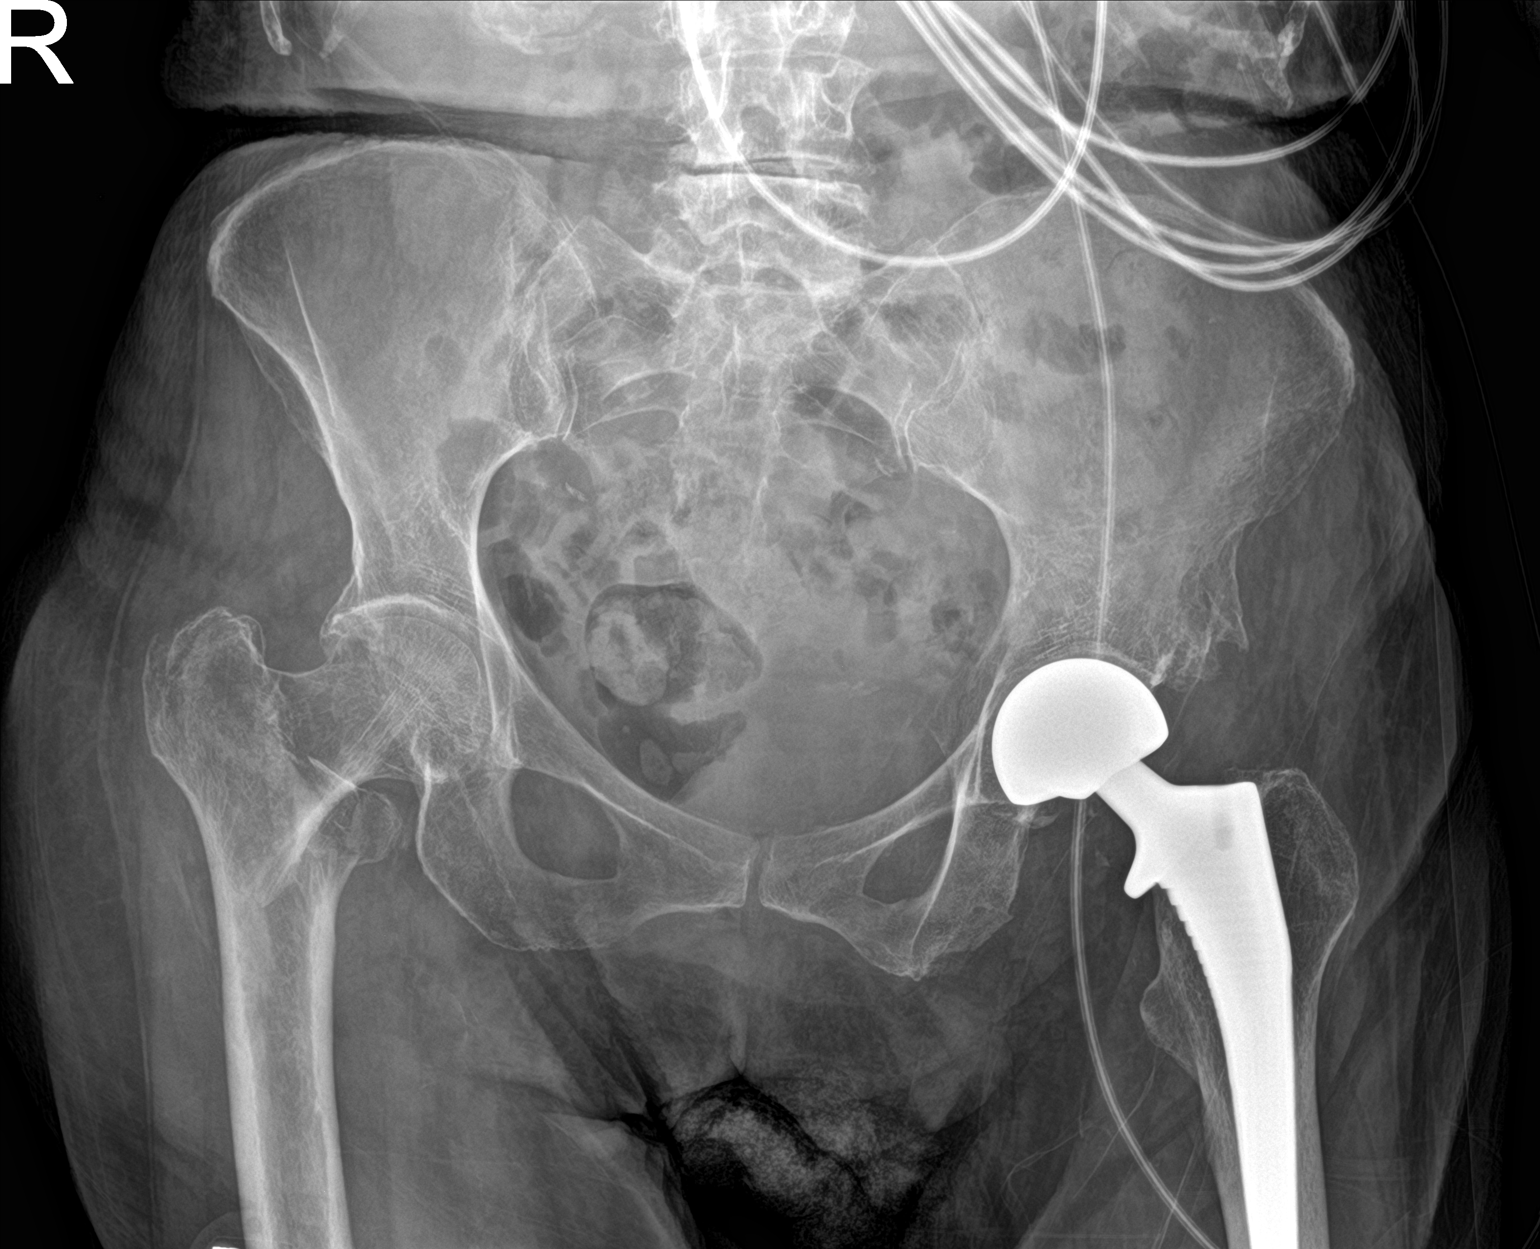

[hip ap]
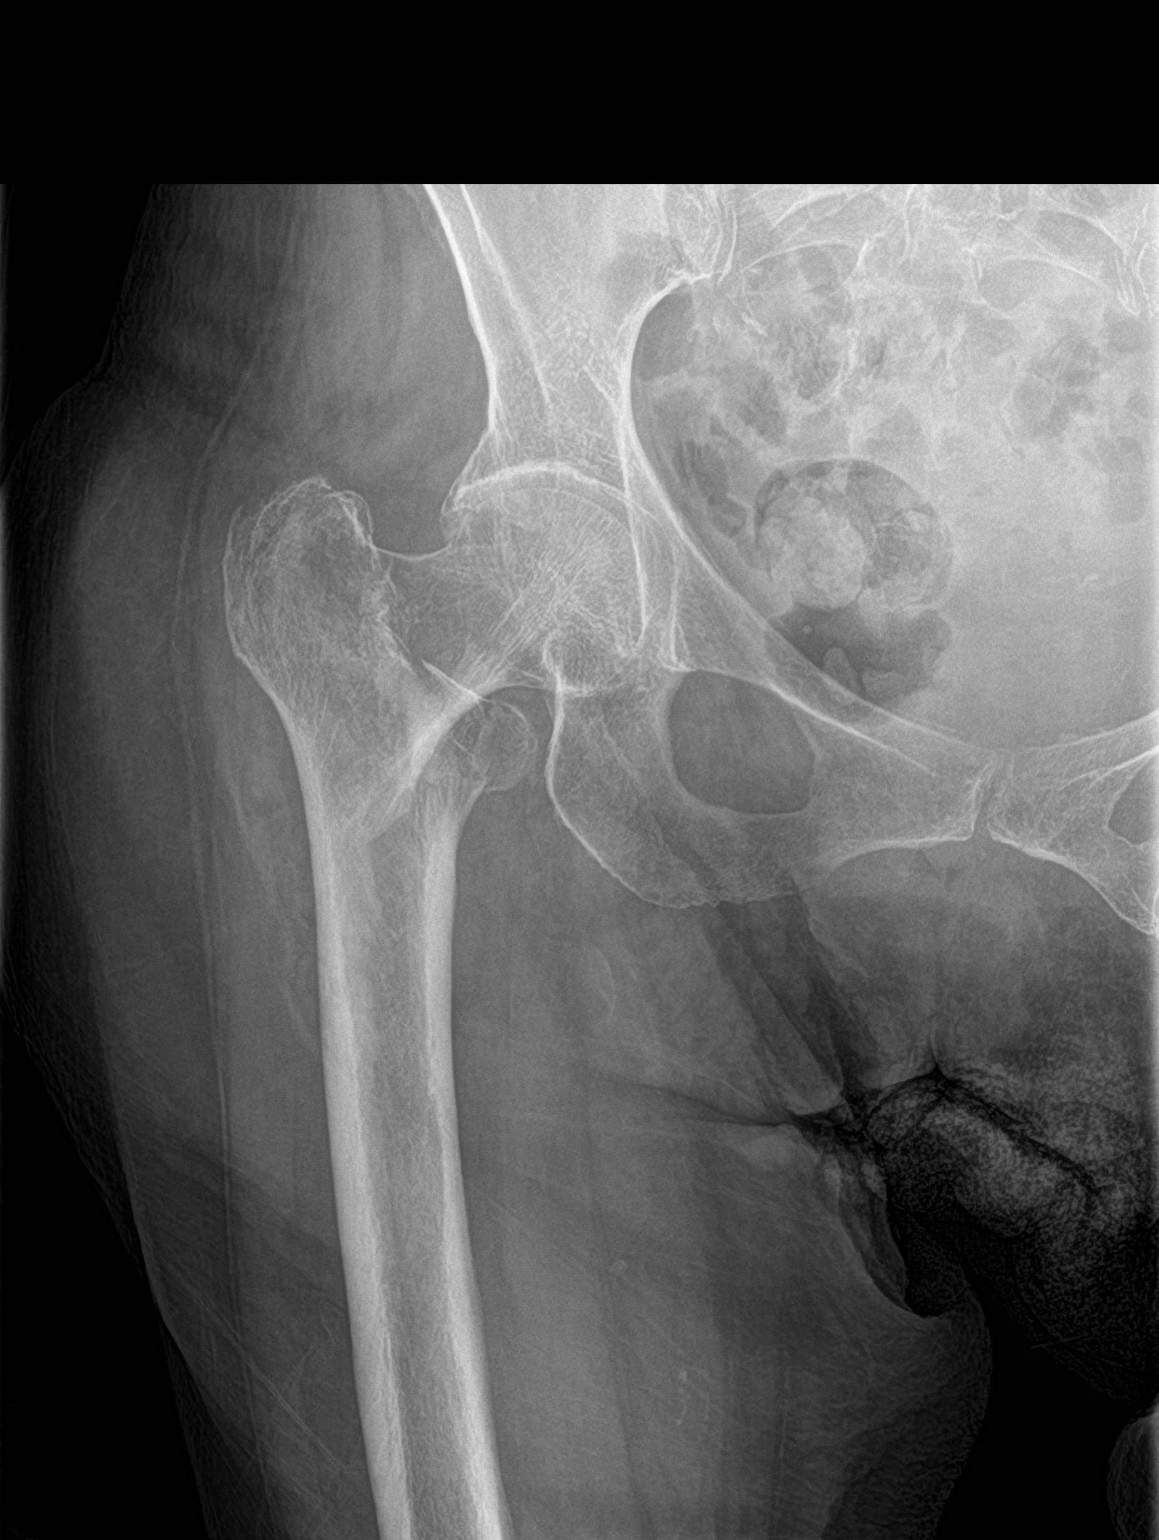

[hip x-table]
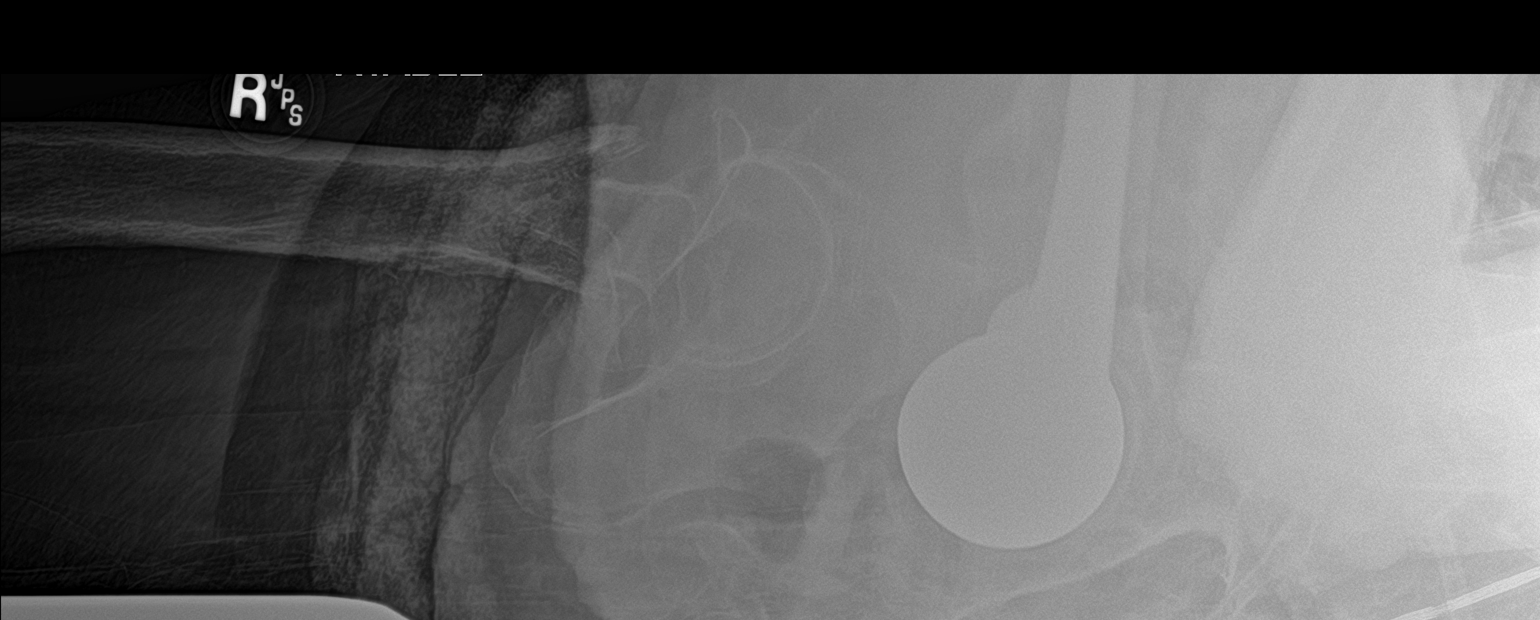

[3 of 3 positions shown; findings below may reference images not displayed]

FINDINGS: There is diffuse decreased bone mineralization. Partial
visualization of total left hip arthroplasty without periprosthetic
lucency to indicate hardware failure or loosening.

Acute right intertrochanteric fracture with mild varus angulation
and mild comminution. Mild right femoroacetabular joint space
narrowing. The bilateral sacroiliac joint spaces are maintained.
IMPRESSION: Acute, mildly angulated, and mildly comminuted right
intertrochanteric fracture.

## 2022-10-06 IMAGING — DX DG CHEST 1V PORT
1 series · 1 of 1 positions shown · non-contrast
Comparison: 01/18/2020

CLINICAL DATA: Fall

EXAM:
PORTABLE CHEST 1 VIEW

[chest]
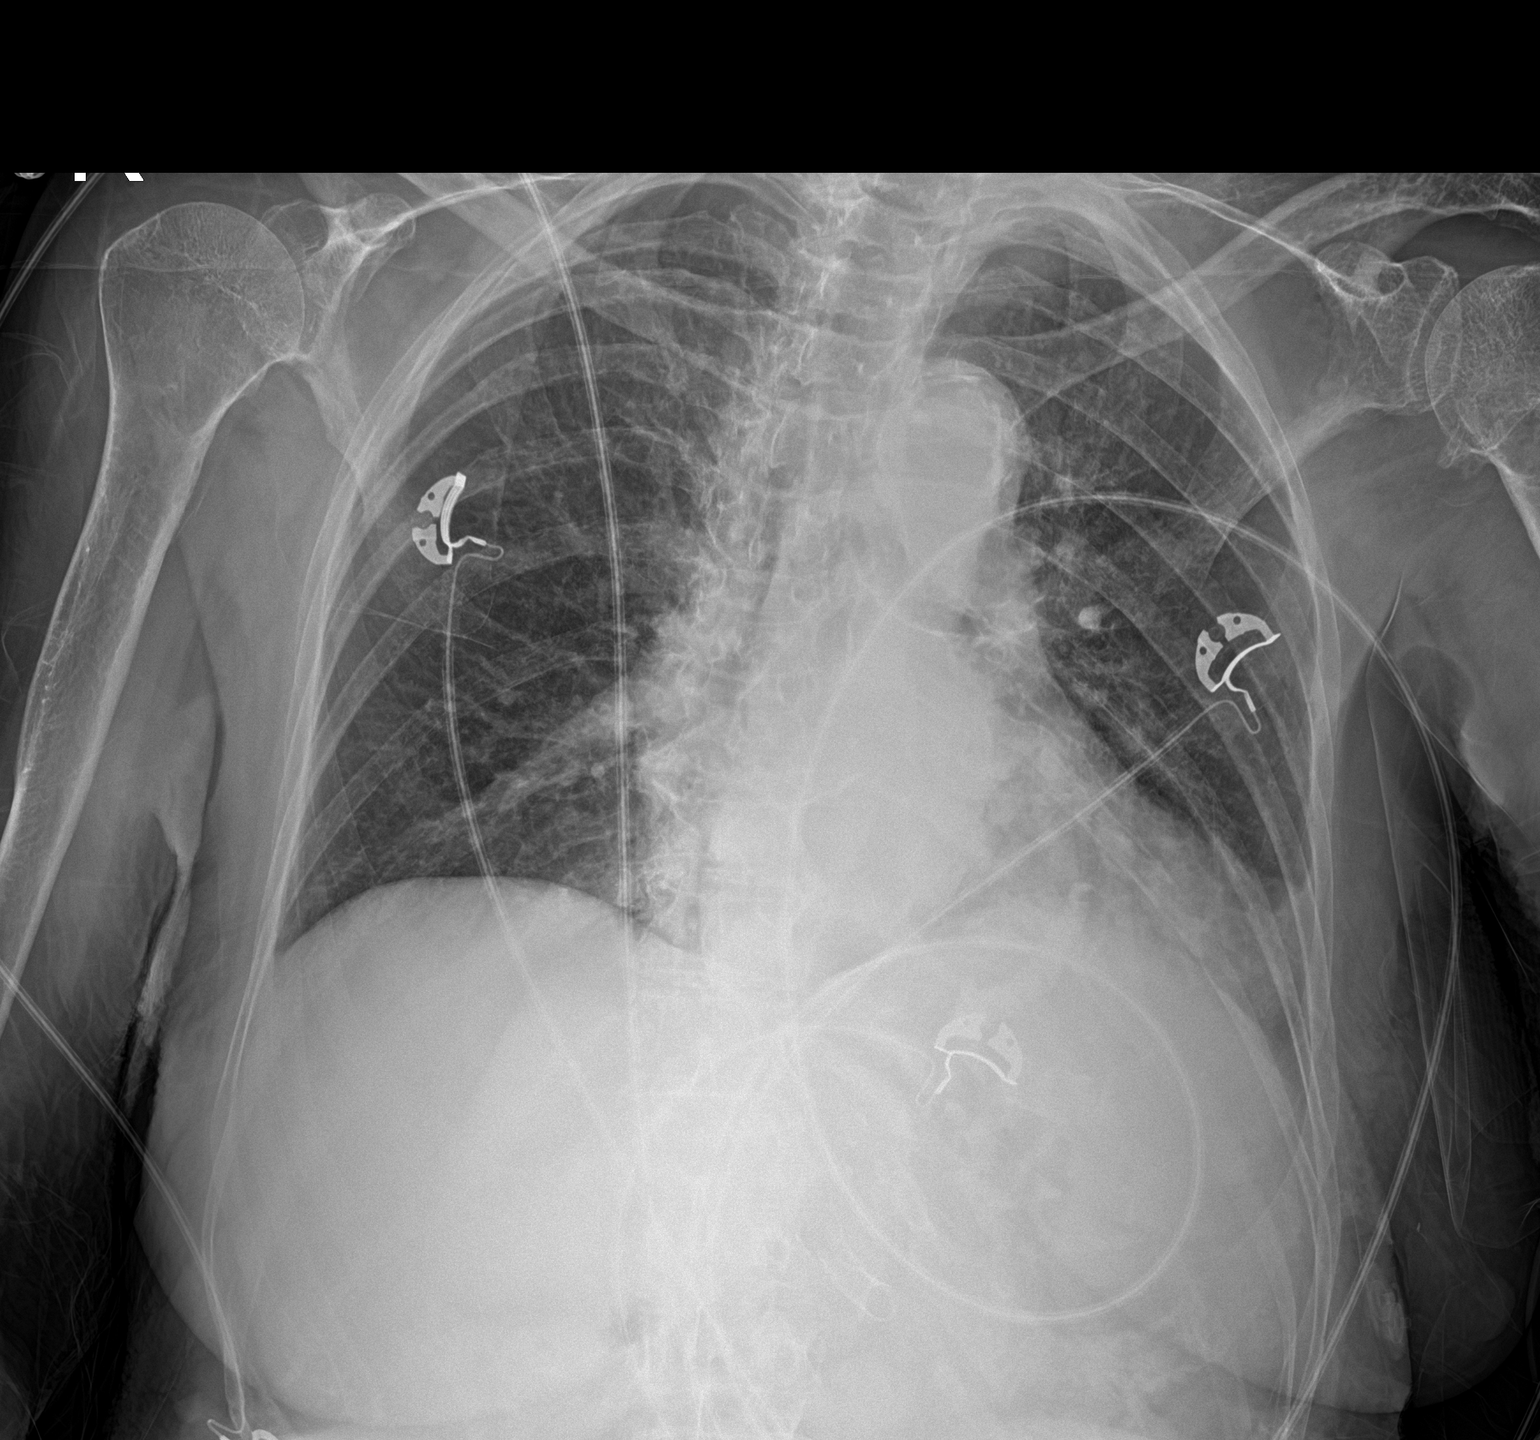

[1 of 1 positions shown; findings below may reference images not displayed]

FINDINGS: Left basilar scarring. Right lung is clear. No pleural effusion or
pneumothorax.

The heart is normal in size.  Thoracic aortic atherosclerosis.
IMPRESSION: No evidence of acute cardiopulmonary disease.

## 2022-10-07 IMAGING — RF DG FEMUR 2+V*R*
1 series · 6 of 6 positions shown · non-contrast
Comparison: 01/18/2022

Fluoroscopy time: 1 minute 22 seconds

Dose: 9.44 mGy

Images: 6

CLINICAL DATA: Post RIGHT femoral nailing

EXAM:
RIGHT FEMUR 2 VIEWS

[Series 1: run · 6 of 6 slices shown]
[im 1/6]
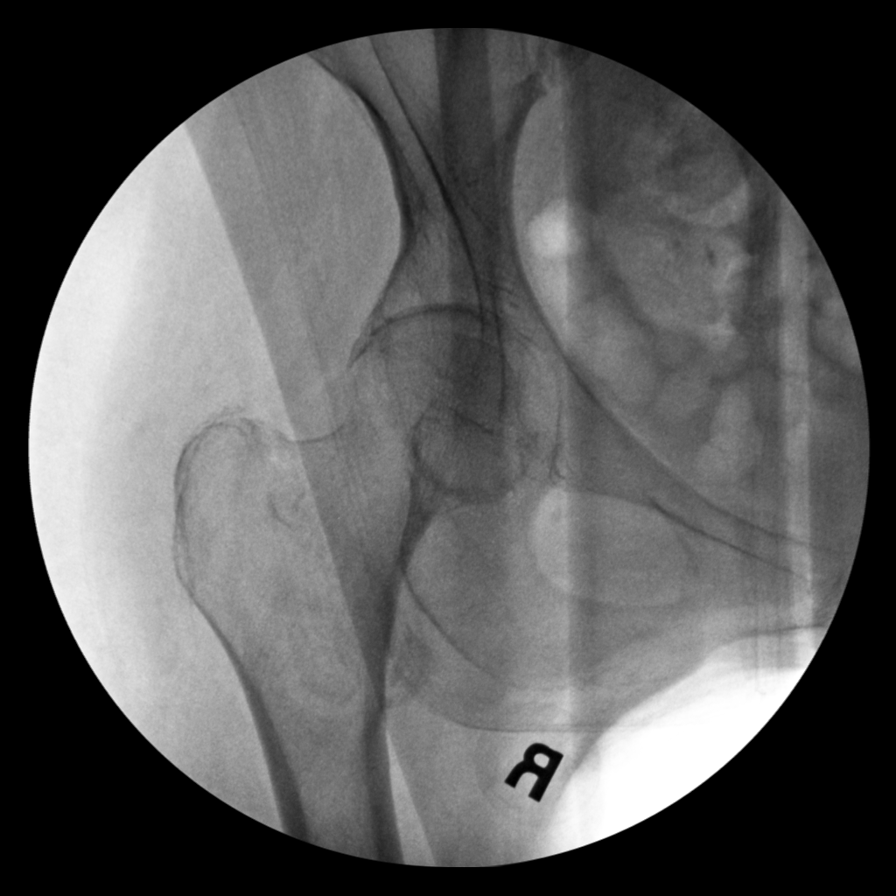
[im 2/6]
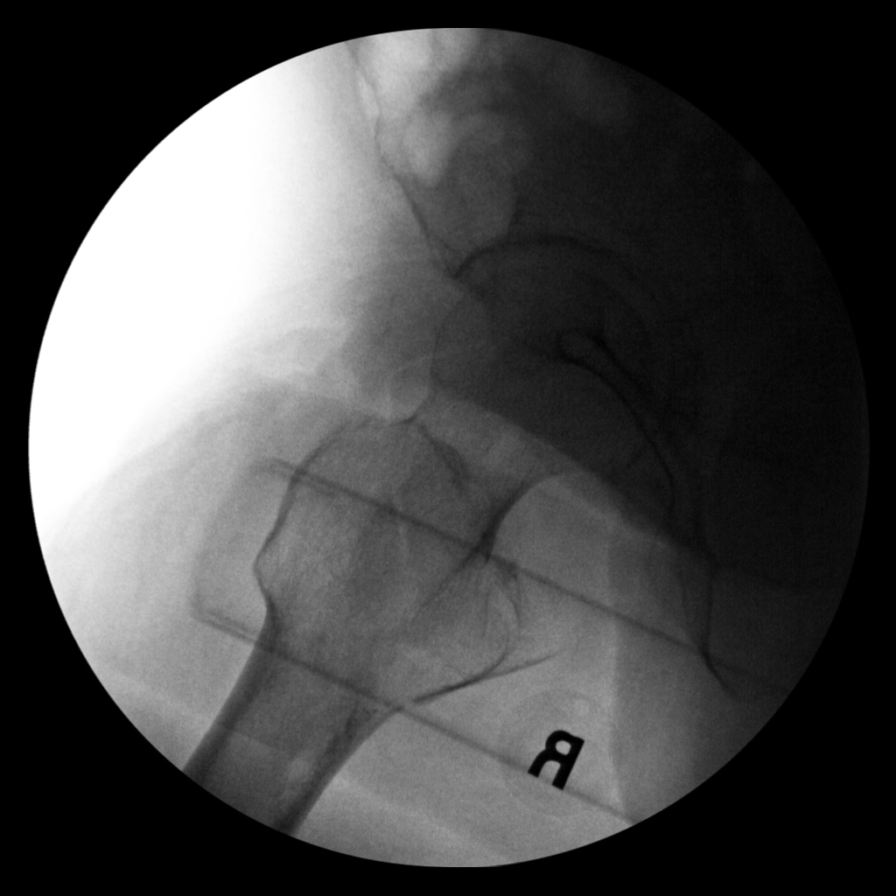
[im 3/6]
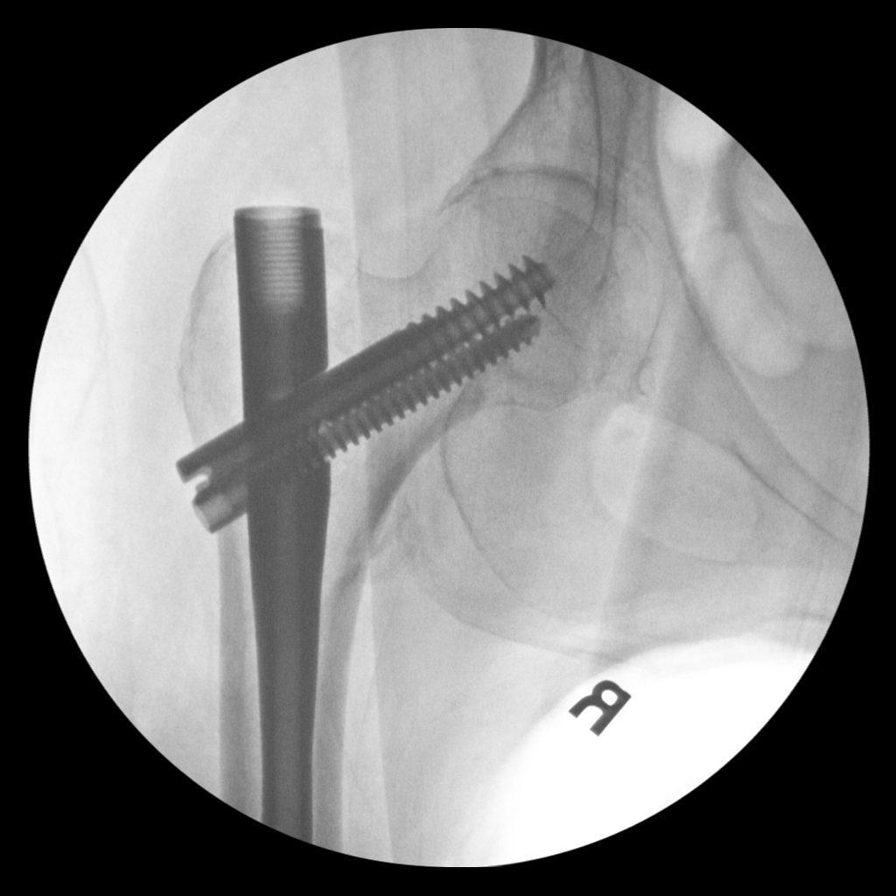
[im 4/6]
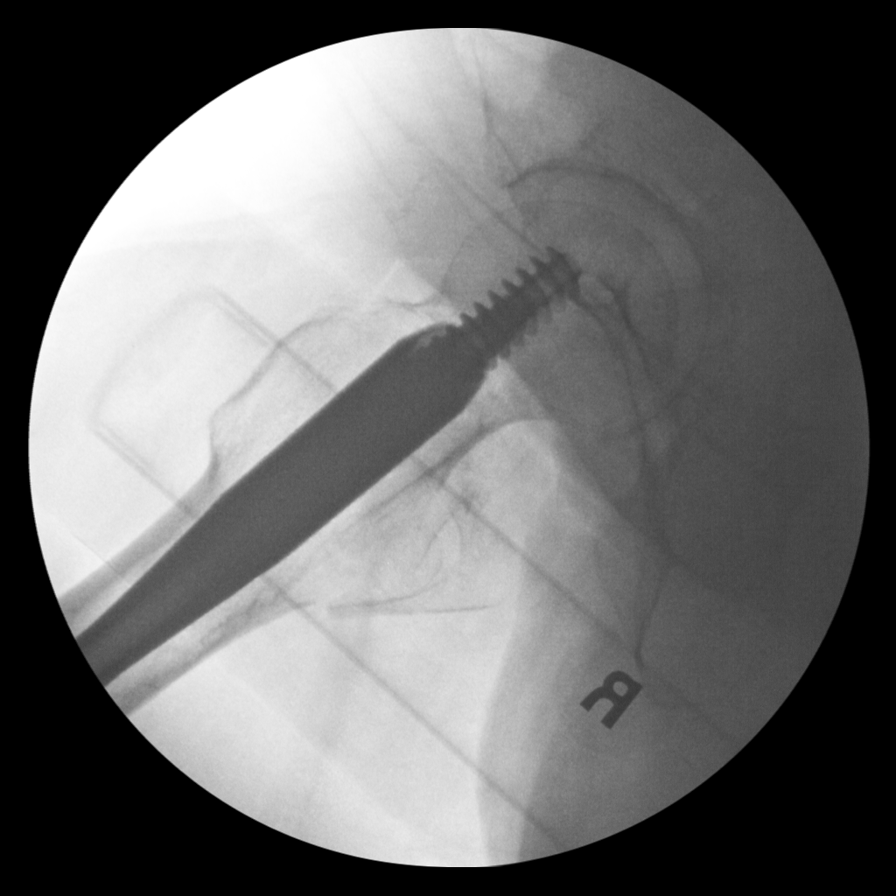
[im 5/6]
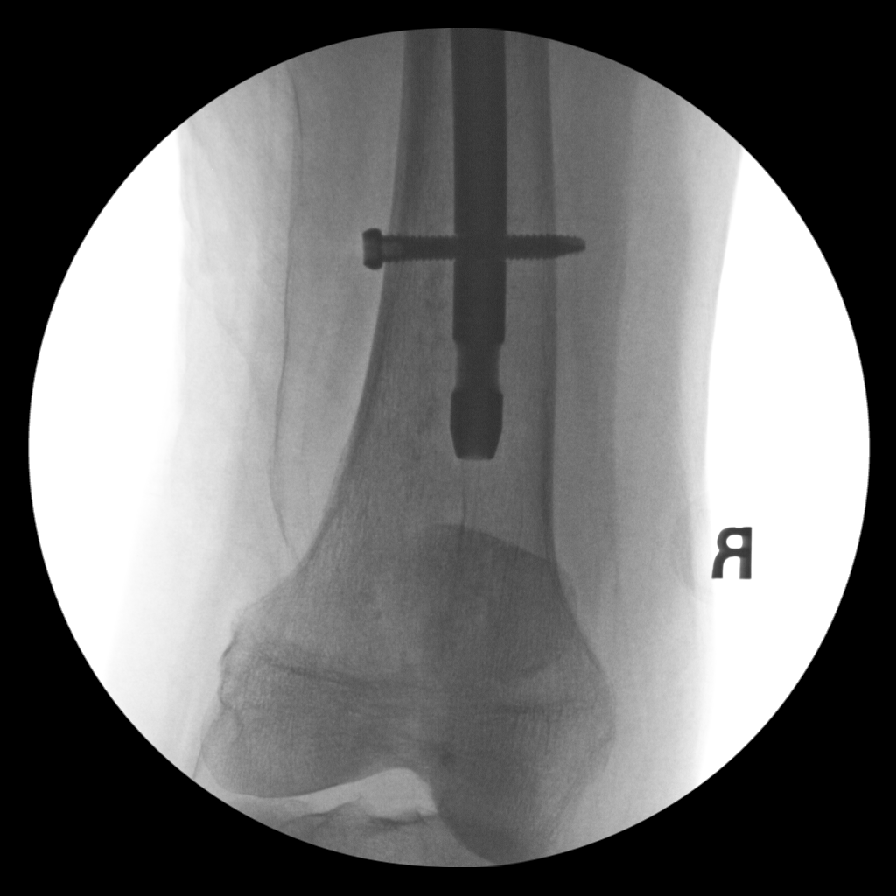
[im 6/6]
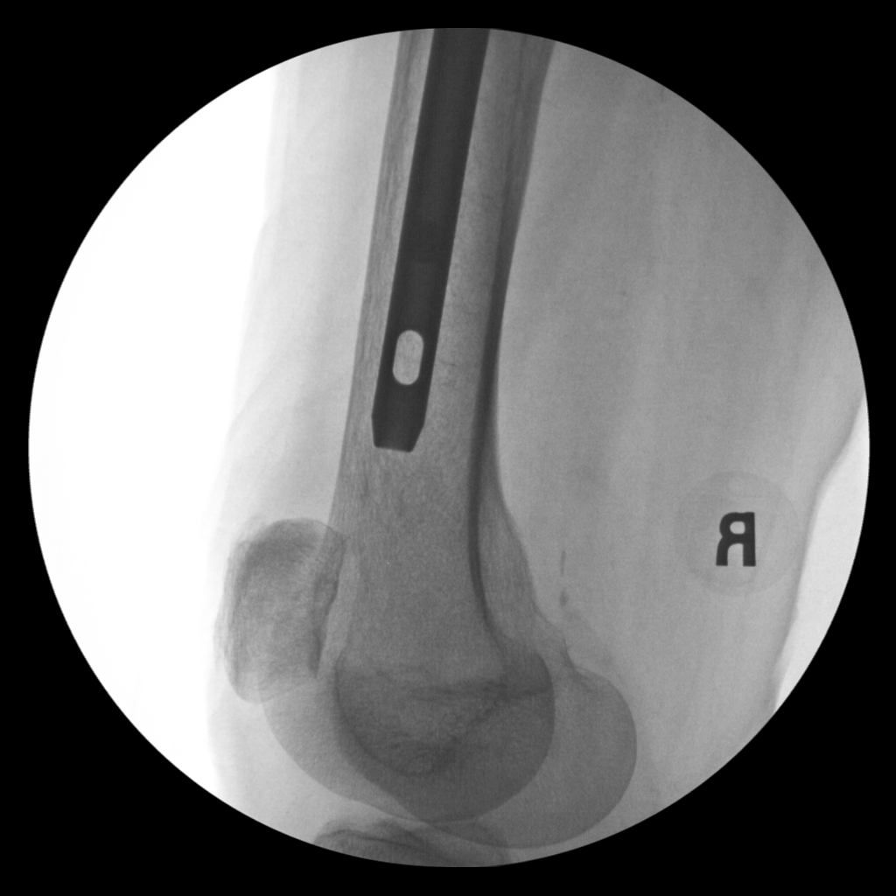

[6 of 6 positions shown; findings below may reference images not displayed]

FINDINGS: Osseous demineralization.

IM nail with 2 compression screws placed across reduced
intertrochanteric fracture RIGHT femur.

No additional fracture or dislocation seen.
IMPRESSION: Post nailing of intertrochanteric fracture RIGHT femur.

## 2023-06-22 DEATH — deceased
